# Patient Record
Sex: Female | Born: 1935 | Race: Black or African American | Hispanic: No | Smoking: Never smoker
Health system: Southern US, Community
[De-identification: ages and names within clinical notes are randomized; demographics above are authoritative.]

## PROBLEM LIST (undated history)

## (undated) DIAGNOSIS — R1310 Dysphagia, unspecified: Secondary | ICD-10-CM

## (undated) DIAGNOSIS — G8190 Hemiplegia, unspecified affecting unspecified side: Secondary | ICD-10-CM

## (undated) DIAGNOSIS — K219 Gastro-esophageal reflux disease without esophagitis: Secondary | ICD-10-CM

## (undated) DIAGNOSIS — R41841 Cognitive communication deficit: Secondary | ICD-10-CM

## (undated) DIAGNOSIS — I119 Hypertensive heart disease without heart failure: Secondary | ICD-10-CM

## (undated) DIAGNOSIS — Z96642 Presence of left artificial hip joint: Secondary | ICD-10-CM

## (undated) DIAGNOSIS — E7849 Other hyperlipidemia: Secondary | ICD-10-CM

## (undated) DIAGNOSIS — I639 Cerebral infarction, unspecified: Secondary | ICD-10-CM

## (undated) DIAGNOSIS — I161 Hypertensive emergency: Secondary | ICD-10-CM

## (undated) DIAGNOSIS — M25552 Pain in left hip: Secondary | ICD-10-CM

## (undated) DIAGNOSIS — E785 Hyperlipidemia, unspecified: Secondary | ICD-10-CM

## (undated) DIAGNOSIS — R945 Abnormal results of liver function studies: Secondary | ICD-10-CM

## (undated) DIAGNOSIS — E876 Hypokalemia: Secondary | ICD-10-CM

## (undated) DIAGNOSIS — R262 Difficulty in walking, not elsewhere classified: Secondary | ICD-10-CM

## (undated) DIAGNOSIS — R7989 Other specified abnormal findings of blood chemistry: Secondary | ICD-10-CM

## (undated) DIAGNOSIS — R0989 Other specified symptoms and signs involving the circulatory and respiratory systems: Secondary | ICD-10-CM

## (undated) DIAGNOSIS — M6281 Muscle weakness (generalized): Secondary | ICD-10-CM

## (undated) DIAGNOSIS — E43 Unspecified severe protein-calorie malnutrition: Secondary | ICD-10-CM

## (undated) DIAGNOSIS — Z931 Gastrostomy status: Secondary | ICD-10-CM

## (undated) DIAGNOSIS — S72009A Fracture of unspecified part of neck of unspecified femur, initial encounter for closed fracture: Secondary | ICD-10-CM

## (undated) HISTORY — DX: Gastrostomy status: Z93.1

## (undated) HISTORY — DX: Hemiplegia, unspecified affecting unspecified side: G81.90

## (undated) HISTORY — DX: Cerebral infarction, unspecified: I63.9

## (undated) HISTORY — DX: Cognitive communication deficit: R41.841

## (undated) HISTORY — DX: Difficulty in walking, not elsewhere classified: R26.2

## (undated) HISTORY — DX: Other hyperlipidemia: E78.49

## (undated) HISTORY — DX: Dysphagia, unspecified: R13.10

## (undated) HISTORY — DX: Hypertensive heart disease without heart failure: I11.9

## (undated) HISTORY — DX: Muscle weakness (generalized): M62.81

## (undated) HISTORY — DX: Unspecified severe protein-calorie malnutrition: E43

## (undated) HISTORY — DX: Pain in left hip: M25.552

## (undated) HISTORY — DX: Other specified symptoms and signs involving the circulatory and respiratory systems: R09.89

## (undated) HISTORY — DX: Presence of left artificial hip joint: Z96.642

## (undated) HISTORY — DX: Other specified abnormal findings of blood chemistry: R79.89

## (undated) HISTORY — DX: Hyperlipidemia, unspecified: E78.5

## (undated) HISTORY — DX: Abnormal results of liver function studies: R94.5

---

## 2017-03-04 DIAGNOSIS — E876 Hypokalemia: Secondary | ICD-10-CM

## 2017-03-04 DIAGNOSIS — S72009A Fracture of unspecified part of neck of unspecified femur, initial encounter for closed fracture: Secondary | ICD-10-CM

## 2017-03-04 DIAGNOSIS — I161 Hypertensive emergency: Secondary | ICD-10-CM

## 2017-03-04 HISTORY — DX: Fracture of unspecified part of neck of unspecified femur, initial encounter for closed fracture: S72.009A

## 2017-03-04 HISTORY — DX: Hypertensive emergency: I16.1

## 2017-03-04 HISTORY — PX: HIP SURGERY: SHX245

## 2017-03-04 HISTORY — DX: Hypokalemia: E87.6

## 2017-03-21 ENCOUNTER — Encounter (HOSPITAL_COMMUNITY): Payer: Self-pay

## 2017-03-21 ENCOUNTER — Other Ambulatory Visit: Payer: Self-pay

## 2017-03-21 ENCOUNTER — Emergency Department (HOSPITAL_COMMUNITY): Payer: Medicare Other

## 2017-03-21 ENCOUNTER — Inpatient Hospital Stay (HOSPITAL_COMMUNITY)
Admission: EM | Admit: 2017-03-21 | Discharge: 2017-04-04 | DRG: 469 | Disposition: A | Discharge status: Skilled Nursing Facility | Payer: Medicare Other | Attending: Internal Medicine | Admitting: Internal Medicine

## 2017-03-21 DIAGNOSIS — E870 Hyperosmolality and hypernatremia: Secondary | ICD-10-CM | POA: Diagnosis present

## 2017-03-21 DIAGNOSIS — Z8673 Personal history of transient ischemic attack (TIA), and cerebral infarction without residual deficits: Secondary | ICD-10-CM

## 2017-03-21 DIAGNOSIS — R748 Abnormal levels of other serum enzymes: Secondary | ICD-10-CM | POA: Diagnosis not present

## 2017-03-21 DIAGNOSIS — R7989 Other specified abnormal findings of blood chemistry: Secondary | ICD-10-CM

## 2017-03-21 DIAGNOSIS — R29810 Facial weakness: Secondary | ICD-10-CM | POA: Diagnosis present

## 2017-03-21 DIAGNOSIS — K219 Gastro-esophageal reflux disease without esophagitis: Secondary | ICD-10-CM

## 2017-03-21 DIAGNOSIS — N179 Acute kidney failure, unspecified: Secondary | ICD-10-CM | POA: Diagnosis present

## 2017-03-21 DIAGNOSIS — R9431 Abnormal electrocardiogram [ECG] [EKG]: Secondary | ICD-10-CM

## 2017-03-21 DIAGNOSIS — E876 Hypokalemia: Secondary | ICD-10-CM | POA: Diagnosis present

## 2017-03-21 DIAGNOSIS — W19XXXD Unspecified fall, subsequent encounter: Secondary | ICD-10-CM | POA: Diagnosis not present

## 2017-03-21 DIAGNOSIS — I639 Cerebral infarction, unspecified: Secondary | ICD-10-CM | POA: Diagnosis not present

## 2017-03-21 DIAGNOSIS — E860 Dehydration: Secondary | ICD-10-CM | POA: Diagnosis present

## 2017-03-21 DIAGNOSIS — S72002D Fracture of unspecified part of neck of left femur, subsequent encounter for closed fracture with routine healing: Secondary | ICD-10-CM | POA: Diagnosis not present

## 2017-03-21 DIAGNOSIS — I6300 Cerebral infarction due to thrombosis of unspecified precerebral artery: Secondary | ICD-10-CM | POA: Diagnosis not present

## 2017-03-21 DIAGNOSIS — Z8249 Family history of ischemic heart disease and other diseases of the circulatory system: Secondary | ICD-10-CM

## 2017-03-21 DIAGNOSIS — S72002A Fracture of unspecified part of neck of left femur, initial encounter for closed fracture: Principal | ICD-10-CM

## 2017-03-21 DIAGNOSIS — N183 Chronic kidney disease, stage 3 (moderate): Secondary | ICD-10-CM | POA: Diagnosis present

## 2017-03-21 DIAGNOSIS — E43 Unspecified severe protein-calorie malnutrition: Secondary | ICD-10-CM | POA: Diagnosis present

## 2017-03-21 DIAGNOSIS — I161 Hypertensive emergency: Secondary | ICD-10-CM | POA: Diagnosis present

## 2017-03-21 DIAGNOSIS — R1310 Dysphagia, unspecified: Secondary | ICD-10-CM

## 2017-03-21 DIAGNOSIS — I6523 Occlusion and stenosis of bilateral carotid arteries: Secondary | ICD-10-CM | POA: Diagnosis present

## 2017-03-21 DIAGNOSIS — I5030 Unspecified diastolic (congestive) heart failure: Secondary | ICD-10-CM | POA: Diagnosis not present

## 2017-03-21 DIAGNOSIS — W19XXXA Unspecified fall, initial encounter: Secondary | ICD-10-CM | POA: Diagnosis present

## 2017-03-21 DIAGNOSIS — I129 Hypertensive chronic kidney disease with stage 1 through stage 4 chronic kidney disease, or unspecified chronic kidney disease: Secondary | ICD-10-CM | POA: Diagnosis present

## 2017-03-21 DIAGNOSIS — I6389 Other cerebral infarction: Secondary | ICD-10-CM | POA: Diagnosis present

## 2017-03-21 DIAGNOSIS — Z4659 Encounter for fitting and adjustment of other gastrointestinal appliance and device: Secondary | ICD-10-CM

## 2017-03-21 DIAGNOSIS — I739 Peripheral vascular disease, unspecified: Secondary | ICD-10-CM | POA: Diagnosis present

## 2017-03-21 DIAGNOSIS — I248 Other forms of acute ischemic heart disease: Secondary | ICD-10-CM | POA: Diagnosis present

## 2017-03-21 DIAGNOSIS — Z789 Other specified health status: Secondary | ICD-10-CM | POA: Diagnosis not present

## 2017-03-21 DIAGNOSIS — Z9181 History of falling: Secondary | ICD-10-CM

## 2017-03-21 DIAGNOSIS — R778 Other specified abnormalities of plasma proteins: Secondary | ICD-10-CM | POA: Diagnosis present

## 2017-03-21 DIAGNOSIS — Z09 Encounter for follow-up examination after completed treatment for conditions other than malignant neoplasm: Secondary | ICD-10-CM

## 2017-03-21 DIAGNOSIS — Z419 Encounter for procedure for purposes other than remedying health state, unspecified: Secondary | ICD-10-CM

## 2017-03-21 HISTORY — DX: Gastro-esophageal reflux disease without esophagitis: K21.9

## 2017-03-21 HISTORY — DX: Cerebral infarction, unspecified: I63.9

## 2017-03-21 HISTORY — DX: Hypokalemia: E87.6

## 2017-03-21 HISTORY — DX: Hypertensive emergency: I16.1

## 2017-03-21 HISTORY — DX: Fracture of unspecified part of neck of unspecified femur, initial encounter for closed fracture: S72.009A

## 2017-03-21 LAB — DIFFERENTIAL
Basophils Absolute: 0 10*3/uL (ref 0.0–0.1)
Basophils Relative: 0 %
EOS ABS: 0 10*3/uL (ref 0.0–0.7)
EOS PCT: 0 %
LYMPHS ABS: 0.7 10*3/uL (ref 0.7–4.0)
Lymphocytes Relative: 8 %
MONO ABS: 0.5 10*3/uL (ref 0.1–1.0)
Monocytes Relative: 6 %
NEUTROS PCT: 86 %
Neutro Abs: 7.4 10*3/uL (ref 1.7–7.7)

## 2017-03-21 LAB — COMPREHENSIVE METABOLIC PANEL
ALBUMIN: 3.4 g/dL — AB (ref 3.5–5.0)
ALK PHOS: 85 U/L (ref 38–126)
ALT: 18 U/L (ref 14–54)
ANION GAP: 19 — AB (ref 5–15)
AST: 36 U/L (ref 15–41)
BILIRUBIN TOTAL: 1 mg/dL (ref 0.3–1.2)
BUN: 25 mg/dL — ABNORMAL HIGH (ref 6–20)
CALCIUM: 9.3 mg/dL (ref 8.9–10.3)
CO2: 30 mmol/L (ref 22–32)
Chloride: 98 mmol/L — ABNORMAL LOW (ref 101–111)
Creatinine, Ser: 1.71 mg/dL — ABNORMAL HIGH (ref 0.44–1.00)
GFR calc non Af Amer: 26 mL/min — ABNORMAL LOW (ref >60)
GFR, EST AFRICAN AMERICAN: 30 mL/min — AB (ref >60)
GLUCOSE: 123 mg/dL — AB (ref 65–99)
Sodium: 147 mmol/L — ABNORMAL HIGH (ref 135–145)
TOTAL PROTEIN: 8.2 g/dL — AB (ref 6.5–8.1)

## 2017-03-21 LAB — CBC
HCT: 37.3 % (ref 36.0–46.0)
Hemoglobin: 12.1 g/dL (ref 12.0–15.0)
MCH: 28.4 pg (ref 26.0–34.0)
MCHC: 32.4 g/dL (ref 30.0–36.0)
MCV: 87.6 fL (ref 78.0–100.0)
PLATELETS: 427 10*3/uL — AB (ref 150–400)
RBC: 4.26 MIL/uL (ref 3.87–5.11)
RDW: 14 % (ref 11.5–15.5)
WBC: 8.6 10*3/uL (ref 4.0–10.5)

## 2017-03-21 LAB — MAGNESIUM: MAGNESIUM: 1.9 mg/dL (ref 1.7–2.4)

## 2017-03-21 LAB — PROTIME-INR
INR: 0.99
Prothrombin Time: 13 seconds (ref 11.4–15.2)

## 2017-03-21 LAB — I-STAT TROPONIN, ED: TROPONIN I, POC: 0.1 ng/mL — AB (ref 0.00–0.08)

## 2017-03-21 LAB — ETHANOL: Alcohol, Ethyl (B): <10 mg/dL (ref <10)

## 2017-03-21 LAB — APTT: aPTT: 31 seconds (ref 24–36)

## 2017-03-21 MED ORDER — STROKE: EARLY STAGES OF RECOVERY BOOK
Freq: Once | Status: AC
  Administered 2017-03-22: 18:00:00
  Filled 2017-03-21: qty 1

## 2017-03-21 MED ORDER — METHOCARBAMOL 500 MG PO TABS: 500.0000 mg | ORAL_TABLET | Freq: Three times a day (TID) | ORAL | Status: DC | PRN

## 2017-03-21 MED ORDER — ACETAMINOPHEN 650 MG RE SUPP: 650.0000 mg | RECTAL | Status: DC | PRN

## 2017-03-21 MED ORDER — PANTOPRAZOLE SODIUM 40 MG IV SOLR
40.0000 mg | Freq: Every day | INTRAVENOUS | Status: DC
  Administered 2017-03-22 – 2017-03-25 (×5): 40 mg via INTRAVENOUS
  Filled 2017-03-21 (×5): qty 40

## 2017-03-21 MED ORDER — MAGNESIUM SULFATE IN D5W 1-5 GM/100ML-% IV SOLN
1.0000 g | Freq: Once | INTRAVENOUS | Status: AC
  Administered 2017-03-21: 1 g via INTRAVENOUS
  Filled 2017-03-21: qty 100

## 2017-03-21 MED ORDER — NITROGLYCERIN 0.4 MG SL SUBL: 0.4000 mg | SUBLINGUAL_TABLET | SUBLINGUAL | Status: DC | PRN

## 2017-03-21 MED ORDER — DEXTROSE-NACL 5-0.45 % IV SOLN
INTRAVENOUS | Status: DC
  Administered 2017-03-22: 05:00:00 via INTRAVENOUS

## 2017-03-21 MED ORDER — POTASSIUM CHLORIDE 10 MEQ/100ML IV SOLN: 10.0000 meq | INTRAVENOUS | Status: DC

## 2017-03-21 MED ORDER — ASPIRIN 325 MG PO TABS
325.0000 mg | ORAL_TABLET | Freq: Every day | ORAL | Status: DC
  Administered 2017-03-24 – 2017-04-04 (×10): 325 mg via ORAL
  Filled 2017-03-21 (×11): qty 1

## 2017-03-21 MED ORDER — ZOLPIDEM TARTRATE 5 MG PO TABS
5.0000 mg | ORAL_TABLET | Freq: Every evening | ORAL | Status: DC | PRN
  Administered 2017-03-30: 5 mg via ORAL
  Filled 2017-03-21: qty 1

## 2017-03-21 MED ORDER — MORPHINE SULFATE (PF) 4 MG/ML IV SOLN
2.0000 mg | INTRAVENOUS | Status: DC | PRN
  Administered 2017-03-23 (×2): 2 mg via INTRAVENOUS
  Filled 2017-03-21 (×2): qty 1

## 2017-03-21 MED ORDER — OXYCODONE-ACETAMINOPHEN 5-325 MG PO TABS: 1.0000 | ORAL_TABLET | ORAL | Status: DC | PRN

## 2017-03-21 MED ORDER — HYDROXYZINE HCL 10 MG PO TABS
10.0000 mg | ORAL_TABLET | Freq: Three times a day (TID) | ORAL | Status: DC | PRN
  Filled 2017-03-21: qty 1

## 2017-03-21 MED ORDER — POTASSIUM CHLORIDE 10 MEQ/100ML IV SOLN
10.0000 meq | INTRAVENOUS | Status: AC
  Administered 2017-03-21 – 2017-03-22 (×4): 10 meq via INTRAVENOUS
  Filled 2017-03-21 (×4): qty 100

## 2017-03-21 MED ORDER — HYDRALAZINE HCL 20 MG/ML IJ SOLN: 5.0000 mg | INTRAMUSCULAR | Status: DC | PRN

## 2017-03-21 MED ORDER — SODIUM CHLORIDE 0.9 % IV SOLN: INTRAVENOUS | Status: DC

## 2017-03-21 MED ORDER — SENNOSIDES-DOCUSATE SODIUM 8.6-50 MG PO TABS: 1.0000 | ORAL_TABLET | Freq: Every evening | ORAL | Status: DC | PRN

## 2017-03-21 MED ORDER — ACETAMINOPHEN 160 MG/5ML PO SOLN: 650.0000 mg | ORAL | Status: DC | PRN

## 2017-03-21 MED ORDER — ACETAMINOPHEN 325 MG PO TABS
650.0000 mg | ORAL_TABLET | ORAL | Status: DC | PRN
  Administered 2017-03-26 – 2017-04-03 (×3): 650 mg via ORAL
  Filled 2017-03-21 (×4): qty 2

## 2017-03-21 MED ORDER — ASPIRIN 300 MG RE SUPP
300.0000 mg | Freq: Every day | RECTAL | Status: DC
  Administered 2017-03-22 – 2017-03-30 (×3): 300 mg via RECTAL
  Filled 2017-03-21 (×4): qty 1

## 2017-03-21 MED ORDER — LABETALOL HCL 5 MG/ML IV SOLN
5.0000 mg | Freq: Once | INTRAVENOUS | Status: AC
  Administered 2017-03-21: 5 mg via INTRAVENOUS
  Filled 2017-03-21: qty 4

## 2017-03-21 NOTE — H&P (Addendum)
History and Physical    Alyssa Savage KNL:[id number] DOB: March 04, 1930 DOA: 03/21/2017  Referring MD/NP/PA:   PCP: Patient, No Pcp Per   Patient coming from:  The patient is coming from home.  At baseline, pt is independent for most of ADL.   Chief Complaint: fall and left hip pain, and slurred speech  HPI: Alyssa Savage is a 84 y.o. female with medical history significant of GERD, who presents with slurred speech and fall and left hip pain.  Per her sister, pt fell approximately 2 weeks ago. She is having pain in her left hip that extends down towards the knee. She initially was walking with pain however this morning she was unable to get up. She was found to have slurred speech in AM. She denies unilateral weakness or numbness in extremities. No vision change or hearing loss. She has mild left facial droop. Patient denies chest pain, shortness breath. She has mild dry cough. She denies nausea, vomiting, diarrhea or abdominal pain no symptoms of UTI.  Patient lives at home with extended family members.  Patient has not seen a doctor in approximately 60 years.    ED Course: pt was found to have trop 0.10, WBC 8.6, INR 0.99, PTT 31, potassium 2.0, acute renal injury with creatinine 1.71, BUN 25, temperature normal, no tachycardia, elevated blood pressure 218/107, negative chest x-ray. X-ray of left femur and pelvis showed left femoral head and neck fracture. Head CT was performed which shows right basal ganglia infarct. Pt is admitted to tele bed as inpt. Neurology, Dr. Leonel Ramsay and orthopedic surgeon, Dr.Swinteck  Review of Systems:   General: no fevers, chills, no body weight gain, has fatigue HEENT: no blurry vision, hearing changes or sore throat Respiratory: no dyspnea, coughing, wheezing CV: no chest pain, no palpitations GI: no nausea, vomiting, abdominal pain, diarrhea, constipation GU: no dysuria, burning on urination, increased urinary frequency, hematuria  Ext: no leg  edema Neuro: no unilateral weakness, numbness, or tingling, no vision change or hearing loss. Has slurred speech and left facial droop. Skin: no rash, no skin tear. MSK: has left hip pain Heme: No easy bruising.  Travel history: No recent long distant travel.  Allergy: No Known Allergies  Past Medical History:  Diagnosis Date  . GERD (gastroesophageal reflux disease)     History reviewed. No pertinent surgical history.  Social History:  reports that  has never smoked. She does not have any smokeless tobacco history on file. She reports that she does not drink alcohol or use drugs.  Family History:  Family History  Problem Relation Age of Onset  . CAD Mother   . Heart disease Mother      Prior to Admission medications   Not on File    Physical Exam: Vitals:   03/21/17 2300 03/21/17 2345 03/22/17 0000 03/22/17 0305  BP: (!) 191/100 (!) 187/89 (!) 189/94 (!) 209/105  Pulse: 60 (!) 47 61 61  Resp: 14 (!) 23 (!) 24 16  Temp:      TempSrc:      SpO2: 97% 96% 97% 96%   General: Not in acute distress. Has dry mucus and membrane HEENT:       Eyes: PERRL, EOMI, no scleral icterus.       ENT: No discharge from the ears and nose, no pharynx injection, no tonsillar enlargement.        Neck: No JVD, no bruit, no mass felt. Heme: No neck lymph node enlargement. Cardiac: S1/S2, RRR, No murmurs, No  gallops or rubs. Respiratory: No rales, wheezing, rhonchi or rubs. GI: Soft, nondistended, nontender, no rebound pain, no organomegaly, BS present. GU: No hematuria Ext: No pitting leg edema bilaterally. 2+DP/PT pulse bilaterally. Musculoskeletal: has left hip tenderness. Skin: No rashes.  Neuro: Alert, oriented X3, Has slurred speech and left facial droop. Muscle strength 5/5 in all extremities, sensation to light touch intact. Brachial reflex 2+ bilaterally. Psych: Patient is not psychotic, no suicidal or hemocidal ideation.  Labs on Admission: I have personally reviewed following  labs and imaging studies  CBC: Recent Labs  Lab 03/21/17 1950  WBC 8.6  NEUTROABS 7.4  HGB 12.1  HCT 37.3  MCV 87.6  PLT 097*   Basic Metabolic Panel: Recent Labs  Lab 03/21/17 1950  NA 147*  K <2.0*  CL 98*  CO2 30  GLUCOSE 123*  BUN 25*  CREATININE 1.71*  CALCIUM 9.3  MG 1.9   GFR: CrCl cannot be calculated (Unknown ideal weight.). Liver Function Tests: Recent Labs  Lab 03/21/17 1950  AST 36  ALT 18  ALKPHOS 85  BILITOT 1.0  PROT 8.2*  ALBUMIN 3.4*   No results for input(s): LIPASE, AMYLASE in the last 168 hours. No results for input(s): AMMONIA in the last 168 hours. Coagulation Profile: Recent Labs  Lab 03/21/17 1950  INR 0.99   Cardiac Enzymes: Recent Labs  Lab 03/22/17 0117  TROPONINI 0.12*   BNP (last 3 results) No results for input(s): PROBNP in the last 8760 hours. HbA1C: No results for input(s): HGBA1C in the last 72 hours. CBG: No results for input(s): GLUCAP in the last 168 hours. Lipid Profile: No results for input(s): CHOL, HDL, LDLCALC, TRIG, CHOLHDL, LDLDIRECT in the last 72 hours. Thyroid Function Tests: No results for input(s): TSH, T4TOTAL, FREET4, T3FREE, THYROIDAB in the last 72 hours. Anemia Panel: No results for input(s): VITAMINB12, FOLATE, FERRITIN, TIBC, IRON, RETICCTPCT in the last 72 hours. Urine analysis: No results found for: COLORURINE, APPEARANCEUR, LABSPEC, PHURINE, GLUCOSEU, HGBUR, BILIRUBINUR, KETONESUR, PROTEINUR, UROBILINOGEN, NITRITE, LEUKOCYTESUR Sepsis Labs: @LABRCNTIP (procalcitonin:4,lacticidven:4) )No results found for this or any previous visit (from the past 240 hour(s)).   Radiological Exams on Admission: Dg Chest 1 View  Result Date: 03/21/2017 CLINICAL DATA:  Fall EXAM: CHEST 1 VIEW COMPARISON:  None. FINDINGS: No acute pulmonary infiltrate or effusion. Mild cardiomegaly. Tortuous ectatic aorta with mild atherosclerosis. No pneumothorax. IMPRESSION: 1. Negative for acute infiltrate or edema 2.  Cardiomegaly Electronically Signed   By: Donavan Foil M.D.   On: 03/21/2017 19:50   Dg Pelvis 1-2 Views  Result Date: 03/21/2017 CLINICAL DATA:  Fall with pain EXAM: PELVIS - 1-2 VIEW COMPARISON:  None. FINDINGS: Acute, displaced left femoral neck fracture with mild superior migration of the distal femur. Left femoral head projects in joint. Pubic symphysis and rami are intact. Normal right hip alignment. Multiple large calcified masses within the pelvis, measuring up to 6.4 cm. IMPRESSION: 1. Acute displaced left femoral neck fracture 2. Multiple large calcified masses in the pelvis, possibly due to large calcified fibroids, CT could better localize the calcifications. Electronically Signed   By: Donavan Foil M.D.   On: 03/21/2017 19:47   Ct Head Wo Contrast  Result Date: 03/21/2017 CLINICAL DATA:  [place] 2 weeks ago, altered mental status. EXAM: CT HEAD WITHOUT CONTRAST TECHNIQUE: Contiguous axial images were obtained from the base of the skull through the vertex without intravenous contrast. COMPARISON:  None. FINDINGS: BRAIN: Patchy hypodense RIGHT basal ganglia. Old LEFT basal ganglia and bilateral thalamus  lacunar infarcts. Small area suspected RIGHT frontal lobe encephalomalacia. Patchy to confluent supratentorial white matter hypodensities. No advanced parenchymal brain volume loss for age. No hydrocephalus. No hemorrhage. No abnormal extra-axial fluid collections. VASCULAR: Mild calcific atherosclerosis of the carotid siphons. SKULL: No skull fracture. Anteriorly subluxed mandible condyles presumably from open mouth positioning. No significant scalp soft tissue swelling. SINUSES/ORBITS: The mastoid air-cells and included paranasal sinuses are well-aerated.The included ocular globes and orbital contents are non-suspicious. OTHER: 14 mm single bubble of gas LEFT infratemporal fossa. IMPRESSION: 1. Acute nonhemorrhagic RIGHT basal ganglia infarct. 2. Old bilateral basal ganglia and thalamus lacunar  infarcts. Moderate to severe chronic small vessel ischemic disease. 3. Acute findings discussed with and reconfirmed by Dr.JON KNAPP on 03/21/2017 at 8:44 pm. Electronically Signed   By: Elon Alas M.D.   On: 03/21/2017 20:45   Dg Femur Min 2 Views Left  Result Date: 03/21/2017 CLINICAL DATA:  Fall with pain EXAM: LEFT FEMUR 2 VIEWS COMPARISON:  None. FINDINGS: Acute displaced left femoral neck fracture with mild varus angulation. No femoral head dislocation. Partially visible calcified pelvic masses. Distal femur appears intact. Degenerative changes at the knee IMPRESSION: Acute displaced left femoral neck fracture Electronically Signed   By: Donavan Foil M.D.   On: 03/21/2017 19:47     EKG: Independently reviewed.  Sinus rhythm, QTc 681, LAD, PVC, mild T-wave inversion in V2-V4  Assessment/Plan Principal Problem:   Stroke [facility]) Active Problems:   Fall   Fracture of femoral neck, left, closed ([place])   AKI (acute kidney injury) ([place])   Hypertensive emergency   Elevated troponin   Hypokalemia   GERD (gastroesophageal reflux disease)   Stroke [facility]):  Head CT was performed which shows right basal ganglia infarct. Dr. Leonel Ramsay was consulted, recommended stroke workup.  - will admit to tele bed  - Obtain MRI/MRA  - will allow permissive HTN in the setting of acute stroke - Check carotid dopplers  - ASA and lipitor - fasting lipid panel and HbA1c  - 2D transthoracic echocardiography  - Check UDS  - PT/OT consult when able to - SLP - SW and CM consult  Fall and fracture of femoral neck, left, closed [facility]): Orthopedic surgeon, Dr.Swinteck was consulted. - Pain control: morphine prn and percocet - When necessary Zofran for nausea - Robaxin for muscle spasm - type and cross - INR/PTT - PT/OT when able to (not ordered now)  AKI (acute kidney injury) [facility]): cre 1.71 and BNU 25. Likely due to prerenal secondary to dehydration - IVF: D5-1/2 NS at 75 cc/h - Check FeNa  -  Follow up renal function by BMP  Hypertensive emergency:  -prn hydralazine for SBP>220  Elevated troponin: trop 0.10. No CP. Likely due to demand ischemia secondary to hypertension and acute stroke. - cycle CE q6 x3 and repeat EKG in the am  - prn Nitroglycerin, Morphine, and aspirin, lipitor  - Risk factor stratification: will check FLP, UDS and A1C  - 2d echo -inpt card consult was requested via Epic  Hypokalemia: K= 2.0  on admission. - Repleted - give 1 gram of magnesium sulfate - Check Mg level  GERD (gastroesophageal reflux disease) -protonix   DVT ppx: SCD Code Status: Full code Family Communication:  Yes, patient's sister at bed side Disposition Plan:  Anticipate discharge back to previous home environment Consults called:  Neurology, Dr. Leonel Ramsay and orthopedic surgeon, [place] Admission status:  Inpatient/tele         Date of Service 03/22/2017  Ivor Costa [facility] Pager [phone]  If 7PM-7AM, please contact night-coverage [email or web address] Password [facility] 03/22/2017, 3:18 AM

## 2017-03-21 NOTE — ED Provider Notes (Signed)
[place] EMERGENCY DEPARTMENT Provider Note   CSN: [id number] Arrival date & time: 03/21/17  1729     History   Chief Complaint Chief Complaint  Patient presents with  . Fall    HPI Alyssa Savage is a 84 y.o. female.  HPI Patient presents to the emergency room for evaluation of hip pain as well as slurred speech.  Patient lives at home with extended family members.  Patient has not seen a doctor in approximately 60 years.  Patient apparently had a fall a couple weeks ago.  She initially was walking with pain however this morning she was unable to get up.  She is having pain in her left hip that extends down towards the knee.  Family also noticed that her speech seemed somewhat slurred.  This is new.  Patient denies any trouble with numbness or weakness in her extremities although she does have difficulty moving her left leg but that seems to be from pain. History reviewed. No pertinent past medical history.  There are no active problems to display for this patient.     OB History    No data available       Home Medications    Prior to Admission medications   Not on File    Family History History reviewed. No pertinent family history.  Social History Social History   Tobacco Use  . Smoking status: Not on file  Substance Use Topics  . Alcohol use: Not on file  . Drug use: Not on file     Allergies   Patient has no allergy information on record.   Review of Systems Review of Systems  All other systems reviewed and are negative.    Physical Exam Updated Vital Signs BP (!) 210/117   Pulse 77   Temp 97.7 F (36.5 C) (Oral)   Resp 18   SpO2 98%   Physical Exam  Constitutional: She appears well-developed and well-nourished. No distress.  HENT:  Head: Normocephalic and atraumatic.  Right Ear: External ear normal.  Left Ear: External ear normal.  Eyes: Conjunctivae are normal. Right eye exhibits no discharge. Left eye  exhibits no discharge. No scleral icterus.  Neck: Neck supple. No tracheal deviation present.  Cardiovascular: Normal rate, regular rhythm and intact distal pulses.  Pulmonary/Chest: Effort normal and breath sounds normal. No stridor. No respiratory distress. She has no wheezes. She has no rales.  Abdominal: Soft. Bowel sounds are normal. She exhibits no distension. There is no tenderness. There is no rebound and no guarding.  Musculoskeletal: She exhibits no edema.       Left hip: She exhibits tenderness and bony tenderness.  Patient has difficulty lifting her left leg off the bed because of pain, she has pain with passive range of motion,  Neurological: She is alert. No cranial nerve deficit (She will right facial droop, extraocular movements intact,  slurred speech ) or sensory deficit. She exhibits normal muscle tone. She displays no seizure activity. Coordination normal.   patient is able to lift both arms off the bed, she is able to plantarflex and dorsiflex equally bilateral lower extremities  Skin: Skin is warm and dry. No rash noted.  Psychiatric: She has a normal mood and affect.  Nursing note and vitals reviewed.    ED Treatments / Results  Labs (all labs ordered are listed, but only abnormal results are displayed) Labs Reviewed  CBC - Abnormal; Notable for the following components:      Result  Value   Platelets 427 (*)    All other components within normal limits  COMPREHENSIVE METABOLIC PANEL - Abnormal; Notable for the following components:   Sodium 147 (*)    Potassium <2.0 (*)    Chloride 98 (*)    Glucose, Bld 123 (*)    BUN 25 (*)    Creatinine, Ser 1.71 (*)    Total Protein 8.2 (*)    Albumin 3.4 (*)    GFR calc non Af Amer 26 (*)    GFR calc Af Amer 30 (*)    Anion gap 19 (*)    All other components within normal limits  I-STAT TROPONIN, ED - Abnormal; Notable for the following components:   Troponin i, poc 0.10 (*)    All other components within normal  limits  ETHANOL  PROTIME-INR  APTT  DIFFERENTIAL  RAPID URINE DRUG SCREEN, HOSP PERFORMED  URINALYSIS, ROUTINE W REFLEX MICROSCOPIC    EKG  EKG Interpretation  Date/Time:  Monday March 21 2017 19:59:55 EST Ventricular Rate:  72 PR Interval:  136 QRS Duration: 90 QT Interval:  622 QTC Calculation: 681 R Axis:   -38 Text Interpretation:  ** Critical Test Result: Long QTc Sinus rhythm with marked sinus arrhythmia Left axis deviation Moderate voltage criteria for LVH, may be normal variant Nonspecific ST and T wave abnormality Prolonged QT Abnormal ECG No old tracing to compare Confirmed by Dorie Rank [phone]) on 03/21/2017 8:04:06 PM       Radiology Dg Chest 1 View  Result Date: 03/21/2017 CLINICAL DATA:  Fall EXAM: CHEST 1 VIEW COMPARISON:  None. FINDINGS: No acute pulmonary infiltrate or effusion. Mild cardiomegaly. Tortuous ectatic aorta with mild atherosclerosis. No pneumothorax. IMPRESSION: 1. Negative for acute infiltrate or edema 2. Cardiomegaly Electronically Signed   By: Donavan Foil M.D.   On: 03/21/2017 19:50   Dg Pelvis 1-2 Views  Result Date: 03/21/2017 CLINICAL DATA:  Fall with pain EXAM: PELVIS - 1-2 VIEW COMPARISON:  None. FINDINGS: Acute, displaced left femoral neck fracture with mild superior migration of the distal femur. Left femoral head projects in joint. Pubic symphysis and rami are intact. Normal right hip alignment. Multiple large calcified masses within the pelvis, measuring up to 6.4 cm. IMPRESSION: 1. Acute displaced left femoral neck fracture 2. Multiple large calcified masses in the pelvis, possibly due to large calcified fibroids, CT could better localize the calcifications. Electronically Signed   By: Donavan Foil M.D.   On: 03/21/2017 19:47   Ct Head Wo Contrast  Result Date: 03/21/2017 CLINICAL DATA:  [place] 2 weeks ago, altered mental status. EXAM: CT HEAD WITHOUT CONTRAST TECHNIQUE: Contiguous axial images were obtained from the base of the skull  through the vertex without intravenous contrast. COMPARISON:  None. FINDINGS: BRAIN: Patchy hypodense RIGHT basal ganglia. Old LEFT basal ganglia and bilateral thalamus lacunar infarcts. Small area suspected RIGHT frontal lobe encephalomalacia. Patchy to confluent supratentorial white matter hypodensities. No advanced parenchymal brain volume loss for age. No hydrocephalus. No hemorrhage. No abnormal extra-axial fluid collections. VASCULAR: Mild calcific atherosclerosis of the carotid siphons. SKULL: No skull fracture. Anteriorly subluxed mandible condyles presumably from open mouth positioning. No significant scalp soft tissue swelling. SINUSES/ORBITS: The mastoid air-cells and included paranasal sinuses are well-aerated.The included ocular globes and orbital contents are non-suspicious. OTHER: 14 mm single bubble of gas LEFT infratemporal fossa. IMPRESSION: 1. Acute nonhemorrhagic RIGHT basal ganglia infarct. 2. Old bilateral basal ganglia and thalamus lacunar infarcts. Moderate to severe chronic small vessel ischemic disease.  3. Acute findings discussed with and reconfirmed by Dr.Franklin [place] on 03/21/2017 at 8:44 pm. Electronically Signed   By: Elon Alas M.D.   On: 03/21/2017 20:45   Dg Femur Min 2 Views Left  Result Date: 03/21/2017 CLINICAL DATA:  Fall with pain EXAM: LEFT FEMUR 2 VIEWS COMPARISON:  None. FINDINGS: Acute displaced left femoral neck fracture with mild varus angulation. No femoral head dislocation. Partially visible calcified pelvic masses. Distal femur appears intact. Degenerative changes at the knee IMPRESSION: Acute displaced left femoral neck fracture Electronically Signed   By: Donavan Foil M.D.   On: 03/21/2017 19:47    Procedures .Critical Care Performed by: Dorie Rank, MD Authorized by: Dorie Rank, MD   Critical care provider statement:    Critical care time (minutes):  30   Critical care was time spent personally by me on the following activities:  Discussions with  consultants, evaluation of patient's response to treatment, examination of patient, ordering and performing treatments and interventions, ordering and review of laboratory studies, ordering and review of radiographic studies, pulse oximetry, re-evaluation of patient's condition, obtaining history from patient or surrogate and review of old charts   (including critical care time)  Medications Ordered in ED Medications  labetalol (NORMODYNE,TRANDATE) injection 5 mg (not administered)  potassium chloride 10 mEq in 100 mL IVPB (not administered)     Initial Impression / Assessment and Plan / ED Course  I have reviewed the triage vital signs and the nursing notes.  Pertinent labs & imaging results that were available during my care of the patient were reviewed by me and considered in my medical decision making (see chart for details).  Clinical Course as of Mar 22 1256  Mon Mar 21, 2017  2126 D/w Leonel Ramsay.  OK to treat BP gently right now.  WIll consult on pt.     [JK]  2127 D/w Dr Lyla Glassing. Will see pt tomorrow  [JK]    Clinical Course User Index [JK] Dorie Rank, MD   Patient presented to the emergency room for evaluation of hip pain and slurred speech.  Patient's physical exam is suggestive of a stroke.  CT scan demonstrates acute/subacute stroke.  Unclear the exact timeframe of her symptoms.  The patient's last time normal was approximately 48 hours ago.  I have ordered labetalol to try to bring down her blood pressure slightly.  She is x-rays show a left hip fracture.  Patient is not stable for surgery right now.  I spoke with Dr. Lyla Glassing.  He will see the patient in consultation.  Patient is also very hypokalemic.  I have ordered IV potassium.  I will consult with the medical service for admission.   Final Clinical Impressions(s) / ED Diagnoses   Final diagnoses:  Closed fracture of left hip, initial encounter ([place])  Prolonged Q-T interval on ECG      Dorie Rank,  MD 03/22/17 1258

## 2017-03-21 NOTE — ED Triage Notes (Signed)
Pt arrived via [facility]; per EMS pt came from [facility] after a fall 2 wks ago. Pt lives alone with extended family looking out for her well-being; Pt is generally ambulatory; however, this am she was unable to get up and c/o pain to L femur area; Pt states that she has not seen MD in over 60 years. No med hx; NKDA; no dx; 148/119; CBG 138

## 2017-03-22 ENCOUNTER — Encounter (HOSPITAL_COMMUNITY): Payer: Self-pay | Admitting: Internal Medicine

## 2017-03-22 ENCOUNTER — Other Ambulatory Visit: Payer: Self-pay

## 2017-03-22 ENCOUNTER — Inpatient Hospital Stay (HOSPITAL_COMMUNITY): Payer: Medicare Other

## 2017-03-22 DIAGNOSIS — I5030 Unspecified diastolic (congestive) heart failure: Secondary | ICD-10-CM

## 2017-03-22 DIAGNOSIS — I6300 Cerebral infarction due to thrombosis of unspecified precerebral artery: Secondary | ICD-10-CM

## 2017-03-22 DIAGNOSIS — I639 Cerebral infarction, unspecified: Secondary | ICD-10-CM

## 2017-03-22 DIAGNOSIS — R748 Abnormal levels of other serum enzymes: Secondary | ICD-10-CM

## 2017-03-22 LAB — LIPID PANEL
CHOL/HDL RATIO: 3 ratio
CHOLESTEROL: 203 mg/dL — AB (ref 0–200)
HDL: 68 mg/dL (ref >40)
LDL Cholesterol: 117 mg/dL — ABNORMAL HIGH (ref 0–99)
Triglycerides: 90 mg/dL (ref <150)
VLDL: 18 mg/dL (ref 0–40)

## 2017-03-22 LAB — ECHOCARDIOGRAM COMPLETE
AOASC: 35 cm
E decel time: 278 msec
FS: 43 % (ref 28–44)
Height: 60 in
IV/PV OW: 1.29
LA diam index: 2.37 cm/m2
LASIZE: 31 mm
LEFT ATRIUM END SYS DIAM: 31 mm
LV PW d: 11.6 mm — AB (ref 0.6–1.1)
LVOT area: 2.54 cm2
LVOT diameter: 18 mm
MV Dec: 278
MVAP: 2.72 cm2
MVPKAVEL: 106 m/s
MVPKEVEL: 41.2 m/s
P 1/2 time: 81 ms
TAPSE: 10.8 mm
Weight: 1449.74 oz

## 2017-03-22 LAB — BASIC METABOLIC PANEL
Anion gap: 23 — ABNORMAL HIGH (ref 5–15)
Anion gap: 17 — ABNORMAL HIGH (ref 5–15)
BUN: 29 mg/dL — AB (ref 6–20)
BUN: 25 mg/dL — AB (ref 6–20)
CALCIUM: 8.9 mg/dL (ref 8.9–10.3)
CALCIUM: 8.9 mg/dL (ref 8.9–10.3)
CHLORIDE: 104 mmol/L (ref 101–111)
CO2: 26 mmol/L (ref 22–32)
CO2: 27 mmol/L (ref 22–32)
CREATININE: 1.57 mg/dL — AB (ref 0.44–1.00)
CREATININE: 1.55 mg/dL — AB (ref 0.44–1.00)
Chloride: 101 mmol/L (ref 101–111)
GFR calc Af Amer: 34 mL/min — ABNORMAL LOW (ref >60)
GFR calc Af Amer: 34 mL/min — ABNORMAL LOW (ref >60)
GFR calc non Af Amer: 30 mL/min — ABNORMAL LOW (ref >60)
GFR, EST NON AFRICAN AMERICAN: 29 mL/min — AB (ref >60)
GLUCOSE: 104 mg/dL — AB (ref 65–99)
Glucose, Bld: 112 mg/dL — ABNORMAL HIGH (ref 65–99)
POTASSIUM: 2.1 mmol/L — AB (ref 3.5–5.1)
Potassium: 2.4 mmol/L — CL (ref 3.5–5.1)
SODIUM: 150 mmol/L — AB (ref 135–145)
Sodium: 148 mmol/L — ABNORMAL HIGH (ref 135–145)

## 2017-03-22 LAB — CREATININE, URINE, RANDOM: Creatinine, Urine: 52.36 mg/dL

## 2017-03-22 LAB — HEMOGLOBIN A1C
Hgb A1c MFr Bld: 6.3 % — ABNORMAL HIGH (ref 4.8–5.6)
Mean Plasma Glucose: 134.11 mg/dL

## 2017-03-22 LAB — RAPID URINE DRUG SCREEN, HOSP PERFORMED
AMPHETAMINES: NOT DETECTED
BENZODIAZEPINES: NOT DETECTED
Barbiturates: NOT DETECTED
Cocaine: NOT DETECTED
Opiates: NOT DETECTED
TETRAHYDROCANNABINOL: NOT DETECTED

## 2017-03-22 LAB — URINALYSIS, ROUTINE W REFLEX MICROSCOPIC
Bilirubin Urine: NEGATIVE
GLUCOSE, UA: 50 mg/dL — AB
KETONES UR: 5 mg/dL — AB
Leukocytes, UA: NEGATIVE
NITRITE: NEGATIVE
PROTEIN: 30 mg/dL — AB
Specific Gravity, Urine: 1.012 (ref 1.005–1.030)
pH: 7 (ref 5.0–8.0)

## 2017-03-22 LAB — TROPONIN I
TROPONIN I: 0.12 ng/mL — AB (ref <0.03)
TROPONIN I: 0.12 ng/mL — AB (ref <0.03)
Troponin I: 0.12 ng/mL (ref <0.03)

## 2017-03-22 LAB — SODIUM, URINE, RANDOM: Sodium, Ur: 138 mmol/L

## 2017-03-22 LAB — ABO/RH: ABO/RH(D): B POS

## 2017-03-22 LAB — MAGNESIUM: MAGNESIUM: 2.2 mg/dL (ref 1.7–2.4)

## 2017-03-22 MED ORDER — ATORVASTATIN CALCIUM 80 MG PO TABS: 80.0000 mg | ORAL_TABLET | Freq: Every day | ORAL | Status: DC

## 2017-03-22 MED ORDER — POTASSIUM CHLORIDE 10 MEQ/100ML IV SOLN
10.0000 meq | INTRAVENOUS | Status: AC
  Administered 2017-03-22 (×2): 10 meq via INTRAVENOUS
  Filled 2017-03-22 (×4): qty 100

## 2017-03-22 MED ORDER — POTASSIUM CHLORIDE 10 MEQ/100ML IV SOLN
10.0000 meq | INTRAVENOUS | Status: AC
  Administered 2017-03-22 (×4): 10 meq via INTRAVENOUS
  Filled 2017-03-22 (×4): qty 100

## 2017-03-22 MED ORDER — POTASSIUM CHLORIDE 20 MEQ/15ML (10%) PO SOLN: 40.0000 meq | Freq: Once | ORAL | Status: DC

## 2017-03-22 MED ORDER — WHITE PETROLATUM EX OINT
TOPICAL_OINTMENT | CUTANEOUS | Status: AC
  Administered 2017-03-22: 22:00:00
  Filled 2017-03-22: qty 28.35

## 2017-03-22 MED ORDER — ATORVASTATIN CALCIUM 40 MG PO TABS
40.0000 mg | ORAL_TABLET | Freq: Every day | ORAL | Status: DC
  Administered 2017-03-23 – 2017-04-03 (×12): 40 mg via ORAL
  Filled 2017-03-22 (×13): qty 1

## 2017-03-22 MED ORDER — POTASSIUM CHLORIDE 10 MEQ/100ML IV SOLN
10.0000 meq | INTRAVENOUS | Status: AC
  Administered 2017-03-22 – 2017-03-23 (×2): 10 meq via INTRAVENOUS
  Filled 2017-03-22 (×2): qty 100

## 2017-03-22 MED ORDER — KCL IN DEXTROSE-NACL 20-5-0.45 MEQ/L-%-% IV SOLN
INTRAVENOUS | Status: DC
  Administered 2017-03-22 – 2017-03-24 (×3): via INTRAVENOUS
  Administered 2017-03-25: 980 mL via INTRAVENOUS
  Administered 2017-03-26: 11:00:00 via INTRAVENOUS
  Filled 2017-03-22 (×7): qty 1000

## 2017-03-22 NOTE — ED Notes (Signed)
Paged MD Blaine Hamper concerning pt's high BP's. States permissive HTN is allowed on this pt d/t acute stroke.

## 2017-03-22 NOTE — Clinical Social Work Note (Signed)
Clinical Social Work Assessment  Patient Details  Name: Alyssa Savage MRN: [id number] Date of Birth: 07-27-1930  Date of referral:  03/22/17               Reason for consult:  Facility Placement                Permission sought to share information with:    Permission granted to share information::  No  Name::        Agency::     Relationship::     Contact Information:     Housing/Transportation Living arrangements for the past 2 months:  Single Family Home(per pt alone. ) Source of Information:  Patient Patient Interpreter Needed:  None Criminal Activity/Legal Involvement Pertinent to Current Situation/Hospitalization:  No - Comment as needed Significant Relationships:  Adult Children, Other Family Members Lives with:  Self Do you feel safe going back to the place where you live?  Yes Need for family participation in patient care:  Yes (Comment)  Care giving concerns:  CSW spoke with pt at bedside. At this time pt expressed no concerns to CSW.    Social Worker assessment / plan:  CSW spoke with pt at bedside. During this time CSW was informed that pt is from home alone. Pt reports that pt has support from other family members (a cousin) that check on pt at times. Pt expressed that pt has a daughter that lives in [place] (not sure) and that she may be coming to see pt once back home. Pt is agreeable to SNF placement at the time of discharge if needed.   Employment status:  Retired Forensic scientist:  Self Pay (Medicaid Pending) PT Recommendations:  Not assessed at this time Information / Referral to community resources:  [place]  Patient/Family's Response to care:  Pt appeared to be understanding and agreeable to plan of care at this time.   Patient/Family's Understanding of and Emotional Response to Diagnosis, Current Treatment, and Prognosis:  No further questions or concerns have been presented to CSW at this time.  Emotional  Assessment Appearance:  Appears stated age Attitude/Demeanor/Rapport:  Engaged Affect (typically observed):    Orientation:  Oriented to Self Alcohol / Substance use:  Not Applicable Psych involvement (Current and /or in the community):  No (Comment)(not at this time.)  Discharge Needs  Concerns to be addressed:  Denies Needs/Concerns at this time Readmission within the last 30 days:  No Current discharge risk:  Dependent with Mobility Barriers to Discharge:  Continued Medical Work up   [employer], [place] 03/22/2017, 10:24 AM

## 2017-03-22 NOTE — Consult Note (Signed)
Cardiology Consultation:   Patient ID: Alyssa Savage; [id number]; 1930/04/19   Admit date: 03/21/2017 Date of Consult: 03/22/2017  Primary Care Provider: Patient, No Pcp Per Primary Cardiologist: New to [facility]    Patient Profile:   Alyssa Savage is a 84 y.o. female with a hx of no significant past medical history injured for evaluation of slurred speech, fall and left hip pain who is being seen today for the evaluation of preoperative clearance at the request of Dr. Posey Pronto.  No prior cardiac history. Patient has not seen a doctor in approximately 60 years.   History of Present Illness:   Ms. Espino presented with multiple complaints. History provided by family at bedside. Patient had a fall approximately 2 weeks ago during grocery shopping. She was walking and suddenly twisted left hip. No loss of consciousness. No prodromes of dizziness, lightheadedness, chest pain or shortness of breath.  She was walking with left hip pain for past 2 weeks.  She lives alone and very independent.  She is very active.  One of family member calls her every day and noted mild slurred speech on Saturday first time.  Sunday was more severe.  However did not pay any attention.  Yesterday patient was unable to stand up and called family member.  Upon arrival she was on floor.  Unable to stand up and severe slurred speech.  EMS was called and brought to ER for further evaluation. R sided weakness.   On arrival creatinine 1.7.  Potassium 2.  CT of the head showed right basal ganglia infract.  Also revealed left femoral neck fracture.  Today MRI showed an acute CVA. Troponin 0.1-->0.12 x2.   She denies any history of chest pain, shortness of breath, palpitation, orthopnea, PND, syncope, lower extremity edema or melena.   Past Medical History:  Diagnosis Date  . GERD (gastroesophageal reflux disease)     History reviewed. No pertinent surgical history.    Inpatient Medications: Scheduled Meds: .  stroke:  mapping our early stages of recovery book   Does not apply Once  . aspirin  300 mg Rectal Daily   Or  . aspirin  325 mg Oral Daily  . atorvastatin  40 mg Oral q1800  . pantoprazole (PROTONIX) IV  40 mg Intravenous QHS  . potassium chloride  40 mEq Oral Once   Continuous Infusions: . dextrose 5 % and 0.45% NaCl Stopped (03/22/17 1022)  . potassium chloride     PRN Meds: acetaminophen **OR** acetaminophen (TYLENOL) oral liquid 160 mg/5 mL **OR** acetaminophen, hydrALAZINE, hydrOXYzine, methocarbamol, morphine injection, nitroGLYCERIN, oxyCODONE-acetaminophen, senna-docusate, zolpidem  Allergies:   No Known Allergies  Social History:   Social History   Socioeconomic History  . Marital status: Widowed    Spouse name: Not on file  . Number of children: Not on file  . Years of education: Not on file  . Highest education level: Not on file  Social Needs  . Financial resource strain: Not on file  . Food insecurity - worry: Not on file  . Food insecurity - inability: Not on file  . Transportation needs - medical: Not on file  . Transportation needs - non-medical: Not on file  Occupational History  . Not on file  Tobacco Use  . Smoking status: Never Smoker  Substance and Sexual Activity  . Alcohol use: No    Frequency: Never  . Drug use: No  . Sexual activity: Not on file  Other Topics Concern  . Not on file  Social  History Narrative  . Not on file    Family History:   Family History  Problem Relation Age of Onset  . CAD Mother   . Heart disease Mother      ROS:  Please see the history of present illness.  All other ROS reviewed and negative.     Physical Exam/Data:   Vitals:   03/22/17 0800 03/22/17 0900 03/22/17 1000 03/22/17 1153  BP: (!) 169/111 (!) 196/118 (!) 206/129 (!) 168/125  Pulse: 85 79 64 81  Resp: (!) 21 (!) 22 18 16   Temp:      TempSrc:      SpO2: 96% 96% 99% 96%    Intake/Output Summary (Last 24 hours) at 03/22/2017 1226 Last data filed at  03/22/2017 0555 Gross per 24 hour  Intake 500 ml  Output -  Net 500 ml   There were no vitals filed for this visit. There is no height or weight on file to calculate BMI.  General: Frail ill appearing thin African-American female with slurred speech HEENT: Drooling face on right side Lymph: no adenopathy Neck: no JVD Endocrine:  No thryomegaly Vascular: No carotid bruits; FA pulses 2+ bilaterally without bruits  Cardiac:  Irregular S1, S2; RRR; no murmur Lungs:  clear to auscultation bilaterally, no wheezing, rhonchi or rales  Abd: soft, nontender, no hepatomegaly  Ext: no edema Musculoskeletal: Right-sided weakness . L hip pain with movement  skin: warm and dry  Neuro: Psych:  Normal affect   EKG:  The EKG was personally reviewed and demonstrates: Sinus rhythm with PACs, non specific st/t wave abnormality  Telemetry:  Telemetry was personally reviewed and demonstrates:  Sinus rhythm at rate of 60s, PACs  Relevant CV Studies: As above  Laboratory Data:  Chemistry Recent Labs  Lab 03/21/17 1950 03/22/17 0345  NA 147* 150*  K <2.0* 2.1*  CL 98* 101  CO2 30 26  GLUCOSE 123* 104*  BUN 25* 29*  CREATININE 1.71* 1.57*  CALCIUM 9.3 8.9  GFRNONAA 26* 29*  GFRAA 30* 34*  ANIONGAP 19* 23*    Recent Labs  Lab 03/21/17 1950  PROT 8.2*  ALBUMIN 3.4*  AST 36  ALT 18  ALKPHOS 85  BILITOT 1.0   Hematology Recent Labs  Lab 03/21/17 1950  WBC 8.6  RBC 4.26  HGB 12.1  HCT 37.3  MCV 87.6  MCH 28.4  MCHC 32.4  RDW 14.0  PLT 427*   Cardiac Enzymes Recent Labs  Lab 03/22/17 0117 03/22/17 0345  TROPONINI 0.12* 0.12*    Recent Labs  Lab 03/21/17 1954  TROPIPOC 0.10*    BNPNo results for input(s): BNP, PROBNP in the last 168 hours.  DDimer No results for input(s): DDIMER in the last 168 hours.  Radiology/Studies:  Dg Chest 1 View  Result Date: 03/21/2017 CLINICAL DATA:  Fall EXAM: CHEST 1 VIEW COMPARISON:  None. FINDINGS: No acute pulmonary infiltrate or  effusion. Mild cardiomegaly. Tortuous ectatic aorta with mild atherosclerosis. No pneumothorax. IMPRESSION: 1. Negative for acute infiltrate or edema 2. Cardiomegaly Electronically Signed   By: Donavan Foil M.D.   On: 03/21/2017 19:50   Dg Pelvis 1-2 Views  Result Date: 03/21/2017 CLINICAL DATA:  Fall with pain EXAM: PELVIS - 1-2 VIEW COMPARISON:  None. FINDINGS: Acute, displaced left femoral neck fracture with mild superior migration of the distal femur. Left femoral head projects in joint. Pubic symphysis and rami are intact. Normal right hip alignment. Multiple large calcified masses within the pelvis, measuring up  to 6.4 cm. IMPRESSION: 1. Acute displaced left femoral neck fracture 2. Multiple large calcified masses in the pelvis, possibly due to large calcified fibroids, CT could better localize the calcifications. Electronically Signed   By: Donavan Foil M.D.   On: 03/21/2017 19:47   Ct Head Wo Contrast  Result Date: 03/21/2017 CLINICAL DATA:  [place] 2 weeks ago, altered mental status. EXAM: CT HEAD WITHOUT CONTRAST TECHNIQUE: Contiguous axial images were obtained from the base of the skull through the vertex without intravenous contrast. COMPARISON:  None. FINDINGS: BRAIN: Patchy hypodense RIGHT basal ganglia. Old LEFT basal ganglia and bilateral thalamus lacunar infarcts. Small area suspected RIGHT frontal lobe encephalomalacia. Patchy to confluent supratentorial white matter hypodensities. No advanced parenchymal brain volume loss for age. No hydrocephalus. No hemorrhage. No abnormal extra-axial fluid collections. VASCULAR: Mild calcific atherosclerosis of the carotid siphons. SKULL: No skull fracture. Anteriorly subluxed mandible condyles presumably from open mouth positioning. No significant scalp soft tissue swelling. SINUSES/ORBITS: The mastoid air-cells and included paranasal sinuses are well-aerated.The included ocular globes and orbital contents are non-suspicious. OTHER: 14 mm single bubble  of gas LEFT infratemporal fossa. IMPRESSION: 1. Acute nonhemorrhagic RIGHT basal ganglia infarct. 2. Old bilateral basal ganglia and thalamus lacunar infarcts. Moderate to severe chronic small vessel ischemic disease. 3. Acute findings discussed with and reconfirmed by Dr.JON KNAPP on 03/21/2017 at 8:44 pm. Electronically Signed   By: Elon Alas M.D.   On: 03/21/2017 20:45   Mr Brain Wo Contrast  Result Date: 03/22/2017 CLINICAL DATA:  [place] 2 weeks ago. Gait disturbance. Slurred speech. Left facial droop. EXAM: MRI HEAD WITHOUT CONTRAST MRA HEAD WITHOUT CONTRAST TECHNIQUE: Multiplanar, multiecho pulse sequences of the brain and surrounding structures were obtained without intravenous contrast. Angiographic images of the head were obtained using MRA technique without contrast. COMPARISON:  CT 03/21/2017 FINDINGS: MRI HEAD FINDINGS Brain: 1.5 x 2 cm region of acute infarction in the posterior basal ganglia/posterior limb internal capsule/radiating white matter tracts on the left. No mass effect or hemorrhage. No other acute infarction. Elsewhere, there are extensive chronic small vessel ischemic changes throughout the pons. No focal cerebellar insult. Chronic small-vessel ischemic changes are present throughout the thalami, basal ganglia and hemispheric white matter. Scattered foci of hemosiderin deposition are present scattered throughout the brain related to the old small vessel insults. No evidence of large vessel territory infarction or mass lesion. No sign of acute hemorrhage. No hydrocephalus or extra-axial collection. Vascular: Major vessels at the base of the brain show flow. Skull and upper cervical spine: Negative Sinuses/Orbits: Clear/normal Other: None MRA HEAD FINDINGS Both internal carotid arteries are patent through the skull base and siphon regions. The anterior and middle cerebral vessels are patent without proximal stenosis. More distal branch vessels show atherosclerotic irregularity and  narrowing, particularly evident in the anterior cerebral arteries. Both vertebral arteries are patent to the basilar. No basilar stenosis. Posterior circulation branch vessels are patent. Pronounced atherosclerotic irregularity of the PCA branches. IMPRESSION: 1.5-2 cm acute infarction in the posterior left basal ganglia, posterior limb internal capsule and radiating white matter tracts. No mass effect or hemorrhage. Widespread chronic small-vessel ischemic changes elsewhere throughout the brain as outlined above. No large vessel occlusion or correctable proximal stenosis. Widespread intracranial medium to small vessel atherosclerotic narrowing and irregularity. Electronically Signed   By: Nelson Chimes M.D.   On: 03/22/2017 11:49   Mr Jodene Nam Head Wo Contrast  Result Date: 03/22/2017 CLINICAL DATA:  [place] 2 weeks ago. Gait disturbance. Slurred speech. Left facial  droop. EXAM: MRI HEAD WITHOUT CONTRAST MRA HEAD WITHOUT CONTRAST TECHNIQUE: Multiplanar, multiecho pulse sequences of the brain and surrounding structures were obtained without intravenous contrast. Angiographic images of the head were obtained using MRA technique without contrast. COMPARISON:  CT 03/21/2017 FINDINGS: MRI HEAD FINDINGS Brain: 1.5 x 2 cm region of acute infarction in the posterior basal ganglia/posterior limb internal capsule/radiating white matter tracts on the left. No mass effect or hemorrhage. No other acute infarction. Elsewhere, there are extensive chronic small vessel ischemic changes throughout the pons. No focal cerebellar insult. Chronic small-vessel ischemic changes are present throughout the thalami, basal ganglia and hemispheric white matter. Scattered foci of hemosiderin deposition are present scattered throughout the brain related to the old small vessel insults. No evidence of large vessel territory infarction or mass lesion. No sign of acute hemorrhage. No hydrocephalus or extra-axial collection. Vascular: Major vessels at  the base of the brain show flow. Skull and upper cervical spine: Negative Sinuses/Orbits: Clear/normal Other: None MRA HEAD FINDINGS Both internal carotid arteries are patent through the skull base and siphon regions. The anterior and middle cerebral vessels are patent without proximal stenosis. More distal branch vessels show atherosclerotic irregularity and narrowing, particularly evident in the anterior cerebral arteries. Both vertebral arteries are patent to the basilar. No basilar stenosis. Posterior circulation branch vessels are patent. Pronounced atherosclerotic irregularity of the PCA branches. IMPRESSION: 1.5-2 cm acute infarction in the posterior left basal ganglia, posterior limb internal capsule and radiating white matter tracts. No mass effect or hemorrhage. Widespread chronic small-vessel ischemic changes elsewhere throughout the brain as outlined above. No large vessel occlusion or correctable proximal stenosis. Widespread intracranial medium to small vessel atherosclerotic narrowing and irregularity. Electronically Signed   By: Nelson Chimes M.D.   On: 03/22/2017 11:49   Dg Femur Min 2 Views Left  Result Date: 03/21/2017 CLINICAL DATA:  Fall with pain EXAM: LEFT FEMUR 2 VIEWS COMPARISON:  None. FINDINGS: Acute displaced left femoral neck fracture with mild varus angulation. No femoral head dislocation. Partially visible calcified pelvic masses. Distal femur appears intact. Degenerative changes at the knee IMPRESSION: Acute displaced left femoral neck fracture Electronically Signed   By: Donavan Foil M.D.   On: 03/21/2017 19:47    Assessment and Plan:   84 year old AA female with no past medical history presented with 2-weeks history of fall and left hip pain.  2 days history of slurred speech and right-sided weakness.  Workup revealed left femoral neck fracture.  Acute CVA. Also has electrolytes abnormality. Persistent hypokalemia despite supplementation. Hypernutremia with Na of 150. Scr  improving from 1.7--->1.57 with presume CKD stage III.  1. Pre-operative clearance - She will need neurology clearance first given acute stroke. Cardiac clearance pending echocardiogram. No s/s concerning for angina or CHF. Wait echo.  2. Elevated troponin - likely demand in acute setting.   3. Hypokalemia with hypernatremia - Correct any electrolytes abnormality.  4. CVA - Neurology on board. Allow permissive hypertension.   For questions or updates, please contact [place] Please consult [email or web address] for contact info under Cardiology/STEMI.   Jarrett Soho, [place]  03/22/2017 12:26 PM   History and all data above reviewed.  Patient examined.  I agree with the findings as above.  The patient has no past cardiac history.  She has no history of chest pain.  She denies palpitations presyncope or syncope.  She is admitted with CVA.  She also has a hip fracture and will need surgery.  Her trop is elevated.  EKG demonstrates no acute ST T wave changes.  The patient exam reveals COR:RRR  ,  Lungs: Clear  ,  Abd: Positive bowel sounds, no rebound no guarding, Ext No edema  .  All available labs, radiology testing, previous records reviewed.   EKG NSR, QT prolonged.  Bifid T wave.  Agree with documented assessment and plan. Preop:  Without acute symptoms or EKG changes in a previously highly functional [place] she will likely be at acceptable risk for surgery from a cardiac standpoint.  However, I would like to see the echo and she will need neurology evaluation preop.  She has accelerated HTN and is NPO.  Currently getting PRN hydralazine and labetalol.  She might need continuous IV therapy if her BP continues to increase.   There is a need for permissive HTN as well.  I will defer BP management to the primary team.    Minus Breeding  3:01 PM  03/22/2017

## 2017-03-22 NOTE — Progress Notes (Signed)
Critical K+ from lab 2.4. MD notified by text page, no new orders

## 2017-03-22 NOTE — Progress Notes (Signed)
[facility] Progress Note  Patient: Alyssa Savage ZOX:[id number]   PCP: Patient, No Pcp Per DOB: 1930/11/23   DOA: 03/21/2017   DOS: 03/22/2017   Date of Service: the patient was seen and examined on 03/22/2017  Subjective: feeling better, still has difficulty speaking.  Also complains about pain in her left leg.  No nausea no vomiting no fever no chills.  Brief hospital course: Pt. with no known PMH ; admitted on 03/21/2017, presented with complaint of fall, was found to have acute stroke as well as left hip fracture. Currently further plan is repeat stroke workup as well as follow orthopedic recommendation.  Assessment and Plan: Stroke [facility]):   Head CT was performed which shows right basal ganglia stroke MRI brain shows 2 cm acute infarction in posterior left basal ganglia as well as posterior left internal capsule radiating to white matter tract. No mass or hemorrhage. Chronic small vessel disease, no significant large vessel disease on MRI. Blood pressure significantly elevated but will allow permissive HTN in the setting of acute stroke Check carotid dopplers  ASA and lipitor LDL 117, hemoglobin A1c 5.9. 2D transthoracic echocardiography pending PT/OT consult when able to SLP  Fall and fracture of femoral neck, left, closed [facility]): Orthopedic surgeon, Dr.Swinteck was consulted. - Pain control: morphine prn and percocet - When necessary Zofran for nausea - Robaxin for muscle spasm -Biggest issue is medical clearance.  Discussed with neurology.  Their concern is worsening of the stroke intraoperatively as well as postoperatively due to hypotension as well as other physiological changes that can happen during the surgery. At present recommended discussed with family, if they are willing to understand the risk and still would like to go for the surgery patient can go for the surgery. Informed orthopedic doctors.  AKI (acute kidney injury) [facility]): cre 1.71 and BNU 25. Likely  due to prerenal secondary to dehydration - IVF: D5-1/2 NS at 75 cc/h  Hypertensive emergency:  -prn hydralazine for SBP>220  Elevated troponin: trop 0.10. No CP. Likely due to demand ischemia secondary to hypertension and acute stroke. - cycle CE q6 x3 and repeat EKG in the am  - prn Nitroglycerin, Morphine, and aspirin, lipitor  - Risk factor stratification: will check FLP, UDS and A1C  - 2d echo -inpt card consult was requested via Epic  Hypokalemia: K= 2.0  on admission. Continue monitoring.  GERD (gastroesophageal reflux disease) -protonix  Diet: N.p.o. pending swallowing evaluation DVT Prophylaxis: subcutaneous Heparin Advance goals of care discussion: Full code  Family Communication: no family was present at bedside, at the time of interview.  Disposition:  Discharge to be determined.   Consultants: Neurology, orthopedics Procedures: Echocardiogram  Antibiotics: Anti-infectives (From admission, onward)   None       Objective: Physical Exam: Vitals:   03/22/17 1153 03/22/17 1233 03/22/17 1550 03/22/17 2027  BP: (!) 168/125 (!) 226/111 (!) 212/122 (!) 203/93  Pulse: 81 88 96 85  Resp: 16 18 19 18   Temp:  98.6 F (37 C) 97.8 F (36.6 C) 98.7 F (37.1 C)  TempSrc:  Axillary Axillary Oral  SpO2: 96%  98% 100%  Weight:  41.1 kg (90 lb 9.7 oz)    Height:  5' (1.524 m)      Intake/Output Summary (Last 24 hours) at 03/22/2017 2032 Last data filed at 03/22/2017 0555 Gross per 24 hour  Intake 500 ml  Output -  Net 500 ml   Filed Weights   03/22/17 1233  Weight: 41.1 kg (90 lb  9.7 oz)   General: Alert, Awake and Oriented to Time, Place and [place]. Appear in mild distress, affect appropriate Eyes: PERRL, Conjunctiva normal ENT: Oral Mucosa clear moist. Neck: no JVD, no Abnormal Mass Or lumps Cardiovascular: S1 and S2 Present, no Murmur, Peripheral Pulses Present Respiratory: normal respiratory effort, Bilateral Air entry equal and Decreased, no use of  accessory muscle, Clear to Auscultation, no Crackles, no wheezes Abdomen: Bowel Sound present, Soft and no tenderness, no hernia Skin: no redness, no Rash, no induration Extremities: no Pedal edema, no calf tenderness Neurologic: A aphasia as well as dysarthria.  Right-sided weakness.  Data Reviewed: CBC: Recent Labs  Lab 03/21/17 1950  WBC 8.6  NEUTROABS 7.4  HGB 12.1  HCT 37.3  MCV 87.6  PLT 086*   Basic Metabolic Panel: Recent Labs  Lab 03/21/17 1950 03/22/17 0345 03/22/17 1610  NA 147* 150* 148*  K <2.0* 2.1* 2.4*  CL 98* 101 104  CO2 30 26 27   GLUCOSE 123* 104* 112*  BUN 25* 29* 25*  CREATININE 1.71* 1.57* 1.55*  CALCIUM 9.3 8.9 8.9  MG 1.9 2.2  --     Liver Function Tests: Recent Labs  Lab 03/21/17 1950  AST 36  ALT 18  ALKPHOS 85  BILITOT 1.0  PROT 8.2*  ALBUMIN 3.4*   No results for input(s): LIPASE, AMYLASE in the last 168 hours. No results for input(s): AMMONIA in the last 168 hours. Coagulation Profile: Recent Labs  Lab 03/21/17 1950  INR 0.99   Cardiac Enzymes: Recent Labs  Lab 03/22/17 0117 03/22/17 0345 03/22/17 1418  TROPONINI 0.12* 0.12* 0.12*   BNP (last 3 results) No results for input(s): PROBNP in the last 8760 hours. CBG: No results for input(s): GLUCAP in the last 168 hours. Studies: Mr Brain Wo Contrast  Result Date: 03/22/2017 CLINICAL DATA:  [place] 2 weeks ago. Gait disturbance. Slurred speech. Left facial droop. EXAM: MRI HEAD WITHOUT CONTRAST MRA HEAD WITHOUT CONTRAST TECHNIQUE: Multiplanar, multiecho pulse sequences of the brain and surrounding structures were obtained without intravenous contrast. Angiographic images of the head were obtained using MRA technique without contrast. COMPARISON:  CT 03/21/2017 FINDINGS: MRI HEAD FINDINGS Brain: 1.5 x 2 cm region of acute infarction in the posterior basal ganglia/posterior limb internal capsule/radiating white matter tracts on the left. No mass effect or hemorrhage. No other  acute infarction. Elsewhere, there are extensive chronic small vessel ischemic changes throughout the pons. No focal cerebellar insult. Chronic small-vessel ischemic changes are present throughout the thalami, basal ganglia and hemispheric white matter. Scattered foci of hemosiderin deposition are present scattered throughout the brain related to the old small vessel insults. No evidence of large vessel territory infarction or mass lesion. No sign of acute hemorrhage. No hydrocephalus or extra-axial collection. Vascular: Major vessels at the base of the brain show flow. Skull and upper cervical spine: Negative Sinuses/Orbits: Clear/normal Other: None MRA HEAD FINDINGS Both internal carotid arteries are patent through the skull base and siphon regions. The anterior and middle cerebral vessels are patent without proximal stenosis. More distal branch vessels show atherosclerotic irregularity and narrowing, particularly evident in the anterior cerebral arteries. Both vertebral arteries are patent to the basilar. No basilar stenosis. Posterior circulation branch vessels are patent. Pronounced atherosclerotic irregularity of the PCA branches. IMPRESSION: 1.5-2 cm acute infarction in the posterior left basal ganglia, posterior limb internal capsule and radiating white matter tracts. No mass effect or hemorrhage. Widespread chronic small-vessel ischemic changes elsewhere throughout the brain as outlined above. No  large vessel occlusion or correctable proximal stenosis. Widespread intracranial medium to small vessel atherosclerotic narrowing and irregularity. Electronically Signed   By: Nelson Chimes M.D.   On: 03/22/2017 11:49   Mr Jodene Nam Head Wo Contrast  Result Date: 03/22/2017 CLINICAL DATA:  [place] 2 weeks ago. Gait disturbance. Slurred speech. Left facial droop. EXAM: MRI HEAD WITHOUT CONTRAST MRA HEAD WITHOUT CONTRAST TECHNIQUE: Multiplanar, multiecho pulse sequences of the brain and surrounding structures were obtained  without intravenous contrast. Angiographic images of the head were obtained using MRA technique without contrast. COMPARISON:  CT 03/21/2017 FINDINGS: MRI HEAD FINDINGS Brain: 1.5 x 2 cm region of acute infarction in the posterior basal ganglia/posterior limb internal capsule/radiating white matter tracts on the left. No mass effect or hemorrhage. No other acute infarction. Elsewhere, there are extensive chronic small vessel ischemic changes throughout the pons. No focal cerebellar insult. Chronic small-vessel ischemic changes are present throughout the thalami, basal ganglia and hemispheric white matter. Scattered foci of hemosiderin deposition are present scattered throughout the brain related to the old small vessel insults. No evidence of large vessel territory infarction or mass lesion. No sign of acute hemorrhage. No hydrocephalus or extra-axial collection. Vascular: Major vessels at the base of the brain show flow. Skull and upper cervical spine: Negative Sinuses/Orbits: Clear/normal Other: None MRA HEAD FINDINGS Both internal carotid arteries are patent through the skull base and siphon regions. The anterior and middle cerebral vessels are patent without proximal stenosis. More distal branch vessels show atherosclerotic irregularity and narrowing, particularly evident in the anterior cerebral arteries. Both vertebral arteries are patent to the basilar. No basilar stenosis. Posterior circulation branch vessels are patent. Pronounced atherosclerotic irregularity of the PCA branches. IMPRESSION: 1.5-2 cm acute infarction in the posterior left basal ganglia, posterior limb internal capsule and radiating white matter tracts. No mass effect or hemorrhage. Widespread chronic small-vessel ischemic changes elsewhere throughout the brain as outlined above. No large vessel occlusion or correctable proximal stenosis. Widespread intracranial medium to small vessel atherosclerotic narrowing and irregularity.  Electronically Signed   By: Nelson Chimes M.D.   On: 03/22/2017 11:49    Scheduled Meds: . aspirin  300 mg Rectal Daily   Or  . aspirin  325 mg Oral Daily  . atorvastatin  40 mg Oral q1800  . pantoprazole (PROTONIX) IV  40 mg Intravenous QHS   Continuous Infusions: . dextrose 5 % and 0.45 % NaCl with KCl 20 mEq/L 75 mL/hr at 03/22/17 1948  . potassium chloride 10 mEq (03/22/17 2027)   PRN Meds: acetaminophen **OR** acetaminophen (TYLENOL) oral liquid 160 mg/5 mL **OR** acetaminophen, hydrALAZINE, hydrOXYzine, methocarbamol, morphine injection, nitroGLYCERIN, oxyCODONE-acetaminophen, senna-docusate, zolpidem  Time spent: 35 minutes  Author: Berle Mull, MD [facility] Pager: [phone] 03/22/2017 8:32 PM  If 7PM-7AM, please contact night-coverage at [email or web address], password [facility]

## 2017-03-22 NOTE — Consult Note (Addendum)
Reason for Consult:Hip fx Referring Physician: P Araina Butrick [name] is an 84 y.o. female.  HPI: Alyssa Savage suffered an acute stroke, likely yesterday or the night before. A friend noted her slurred speech and convinced her to seek help. She c/o left hip pain that started in the store but she denies that she fell. She is quite dysarthric and most of the history was supplied by a friend in the room. She has a dtr who is coming from [place], probably tomorrow. Aside from eye checks she has not been to a doctor in >60 years.  Past Medical History:  Diagnosis Date  . GERD (gastroesophageal reflux disease)     History reviewed. No pertinent surgical history.  Family History  Problem Relation Age of Onset  . CAD Mother   . Heart disease Mother     Social History:  reports that  has never smoked. She does not have any smokeless tobacco history on file. She reports that she does not drink alcohol or use drugs.  Allergies: No Known Allergies  Medications: I have reviewed the patient's current medications.  Results for orders placed or performed during the hospital encounter of 03/21/17 (from the past 48 hour(s))  Ethanol     Status: None   Collection Time: 03/21/17  7:49 PM  Result Value Ref Range   Alcohol, Ethyl (B) <10 <10 mg/dL    Comment:        LOWEST DETECTABLE LIMIT FOR SERUM ALCOHOL IS 10 mg/dL FOR MEDICAL PURPOSES ONLY Performed at [facility] [address], [place], [place] [address]   Protime-INR     Status: None   Collection Time: 03/21/17  7:50 PM  Result Value Ref Range   Prothrombin Time 13.0 11.4 - 15.2 seconds   INR 0.99     Comment: Performed at [facility] [address], [place], [place] [address]  APTT     Status: None   Collection Time: 03/21/17  7:50 PM  Result Value Ref Range   aPTT 31 24 - 36 seconds    Comment: Performed at [facility] [address] [place], [place] [address]  CBC     Status: Abnormal   Collection Time: 03/21/17   7:50 PM  Result Value Ref Range   WBC 8.6 4.0 - 10.5 K/uL   RBC 4.26 3.87 - 5.11 MIL/uL   Hemoglobin 12.1 12.0 - 15.0 g/dL   HCT 37.3 36.0 - 46.0 %   MCV 87.6 78.0 - 100.0 fL   MCH 28.4 26.0 - 34.0 pg   MCHC 32.4 30.0 - 36.0 g/dL   RDW 14.0 11.5 - 15.5 %   Platelets 427 (H) 150 - 400 K/uL    Comment: Performed at [facility] [address], [place], [place] [address]  Differential     Status: None   Collection Time: 03/21/17  7:50 PM  Result Value Ref Range   Neutrophils Relative % 86 %   Neutro Abs 7.4 1.7 - 7.7 K/uL   Lymphocytes Relative 8 %   Lymphs Abs 0.7 0.7 - 4.0 K/uL   Monocytes Relative 6 %   Monocytes Absolute 0.5 0.1 - 1.0 K/uL   Eosinophils Relative 0 %   Eosinophils Absolute 0.0 0.0 - 0.7 K/uL   Basophils Relative 0 %   Basophils Absolute 0.0 0.0 - 0.1 K/uL    Comment: Performed at [facility] [address], [place], [place] [address]  Comprehensive  metabolic panel     Status: Abnormal   Collection Time: 03/21/17  7:50 PM  Result Value Ref Range   Sodium 147 (H) 135 - 145 mmol/L   Potassium <2.0 (LL) 3.5 - 5.1 mmol/L    Comment: REPEATED TO VERIFY CRITICAL RESULT CALLED TO, READ BACK BY AND VERIFIED WITH: [place] [facility] 03/21/17 2144 WAYK    Chloride 98 (L) 101 - 111 mmol/L   CO2 30 22 - 32 mmol/L   Glucose, Bld 123 (H) 65 - 99 mg/dL   BUN 25 (H) 6 - 20 mg/dL   Creatinine, Ser 1.71 (H) 0.44 - 1.00 mg/dL   Calcium 9.3 8.9 - 10.3 mg/dL   Total Protein 8.2 (H) 6.5 - 8.1 g/dL   Albumin 3.4 (L) 3.5 - 5.0 g/dL   AST 36 15 - 41 U/L   ALT 18 14 - 54 U/L   Alkaline Phosphatase 85 38 - 126 U/L   Total Bilirubin 1.0 0.3 - 1.2 mg/dL   GFR calc non Af Amer 26 (L) >60 mL/min   GFR calc Af Amer 30 (L) >60 mL/min    Comment: (NOTE) The eGFR has been calculated using the CKD EPI equation. This calculation has not been validated in all clinical situations. eGFR's persistently <60 mL/min signify possible Chronic Kidney Disease.    Anion gap 19 (H) 5 -  15    Comment: Performed at [facility], [place] [address], [place], [place] [address]  Magnesium     Status: None   Collection Time: 03/21/17  7:50 PM  Result Value Ref Range   Magnesium 1.9 1.7 - 2.4 mg/dL    Comment: Performed at [facility], [place] [address], [place], [place] [address]  I-stat troponin, ED (not at [facility])     Status: Abnormal   Collection Time: 03/21/17  7:54 PM  Result Value Ref Range   Troponin i, poc 0.10 (HH) 0.00 - 0.08 ng/mL   Comment NOTIFIED PHYSICIAN    Comment 3            Comment: Due to the release kinetics of cTnI, a negative result within the first hours of the onset of symptoms does not rule out myocardial infarction with certainty. If myocardial infarction is still suspected, repeat the test at appropriate intervals.   Troponin I (q 6hr x 3)     Status: Abnormal   Collection Time: 03/22/17  1:17 AM  Result Value Ref Range   Troponin I 0.12 (HH) <0.03 ng/mL    Comment: CRITICAL RESULT CALLED TO, READ BACK BY AND VERIFIED WITH: T.JORDAN,RN 0206 03/22/17 M.CAMPBELL Performed at [facility], [place] [address] [place], [place] [address]   Type and screen [place]     Status: None   Collection Time: 03/22/17  1:25 AM  Result Value Ref Range   ABO/RH(D) B POS    Antibody Screen NEG    Sample Expiration      03/25/2017 Performed at [facility], [place] [address] [place], [place] [address]   ABO/Rh     Status: None   Collection Time: 03/22/17  1:25 AM  Result Value Ref Range   ABO/RH(D)      B POS Performed at [facility] [address], [place], [place] [address]   Hemoglobin A1c     Status: Abnormal   Collection Time: 03/22/17  3:44 AM  Result Value Ref Range   Hgb A1c MFr Bld  6.3 (H) 4.8 - 5.6 %    Comment: (NOTE) Pre diabetes:          5.7%-6.4% Diabetes:              >6.4% Glycemic control for   <7.0% adults with diabetes    Mean Plasma Glucose 134.11 mg/dL    Comment: Performed  at [facility], [place] [address] [place], [place] [address]  Lipid panel     Status: Abnormal   Collection Time: 03/22/17  3:44 AM  Result Value Ref Range   Cholesterol 203 (H) 0 - 200 mg/dL   Triglycerides 90 <150 mg/dL   HDL 68 >40 mg/dL   Total CHOL/HDL Ratio 3.0 RATIO   VLDL 18 0 - 40 mg/dL   LDL Cholesterol 117 (H) 0 - 99 mg/dL    Comment:        Total Cholesterol/HDL:CHD Risk Coronary Heart Disease Risk Table                     Men   Women  1/2 Average Risk   3.4   3.3  Average Risk       5.0   4.4  2 X Average Risk   9.6   7.1  3 X Average Risk  23.4   11.0        Use the calculated Patient Ratio above and the CHD Risk Table to determine the patient's CHD Risk.        ATP III CLASSIFICATION (LDL):  <100     mg/dL   Optimal  100-129  mg/dL   Near or Above                    Optimal  130-159  mg/dL   Borderline  160-189  mg/dL   High  >190     mg/dL   Very High Performed at [facility] [address], [place], [place] [address]   Troponin I (q 6hr x 3)     Status: Abnormal   Collection Time: 03/22/17  3:45 AM  Result Value Ref Range   Troponin I 0.12 (HH) <0.03 ng/mL    Comment: CRITICAL VALUE NOTED.  VALUE IS CONSISTENT WITH PREVIOUSLY REPORTED AND CALLED VALUE. Performed at [facility], [place] [address], [place], [place] [address]     Dg Chest 1 View  Result Date: 03/21/2017 CLINICAL DATA:  Fall EXAM: CHEST 1 VIEW COMPARISON:  None. FINDINGS: No acute pulmonary infiltrate or effusion. Mild cardiomegaly. Tortuous ectatic aorta with mild atherosclerosis. No pneumothorax. IMPRESSION: 1. Negative for acute infiltrate or edema 2. Cardiomegaly Electronically Signed   By: Donavan Foil M.D.   On: 03/21/2017 19:50   Dg Pelvis 1-2 Views  Result Date: 03/21/2017 CLINICAL DATA:  Fall with pain EXAM: PELVIS - 1-2 VIEW COMPARISON:  None. FINDINGS: Acute, displaced left femoral neck fracture with mild superior migration of the distal femur. Left femoral  head projects in joint. Pubic symphysis and rami are intact. Normal right hip alignment. Multiple large calcified masses within the pelvis, measuring up to 6.4 cm. IMPRESSION: 1. Acute displaced left femoral neck fracture 2. Multiple large calcified masses in the pelvis, possibly due to large calcified fibroids, CT could better localize the calcifications. Electronically Signed   By: Donavan Foil M.D.   On: 03/21/2017 19:47   Ct Head Wo Contrast  Result Date: 03/21/2017 CLINICAL DATA:  [place] 2 weeks ago, altered mental status. EXAM:  CT HEAD WITHOUT CONTRAST TECHNIQUE: Contiguous axial images were obtained from the base of the skull through the vertex without intravenous contrast. COMPARISON:  None. FINDINGS: BRAIN: Patchy hypodense RIGHT basal ganglia. Old LEFT basal ganglia and bilateral thalamus lacunar infarcts. Small area suspected RIGHT frontal lobe encephalomalacia. Patchy to confluent supratentorial white matter hypodensities. No advanced parenchymal brain volume loss for age. No hydrocephalus. No hemorrhage. No abnormal extra-axial fluid collections. VASCULAR: Mild calcific atherosclerosis of the carotid siphons. SKULL: No skull fracture. Anteriorly subluxed mandible condyles presumably from open mouth positioning. No significant scalp soft tissue swelling. SINUSES/ORBITS: The mastoid air-cells and included paranasal sinuses are well-aerated.The included ocular globes and orbital contents are non-suspicious. OTHER: 14 mm single bubble of gas LEFT infratemporal fossa. IMPRESSION: 1. Acute nonhemorrhagic RIGHT basal ganglia infarct. 2. Old bilateral basal ganglia and thalamus lacunar infarcts. Moderate to severe chronic small vessel ischemic disease. 3. Acute findings discussed with and reconfirmed by Dr.JON KNAPP on 03/21/2017 at 8:44 pm. Electronically Signed   By: Elon Alas M.D.   On: 03/21/2017 20:45   Dg Femur Min 2 Views Left  Result Date: 03/21/2017 CLINICAL DATA:  Fall with pain EXAM:  LEFT FEMUR 2 VIEWS COMPARISON:  None. FINDINGS: Acute displaced left femoral neck fracture with mild varus angulation. No femoral head dislocation. Partially visible calcified pelvic masses. Distal femur appears intact. Degenerative changes at the knee IMPRESSION: Acute displaced left femoral neck fracture Electronically Signed   By: Donavan Foil M.D.   On: 03/21/2017 19:47    Review of Systems  Unable to perform ROS: Patient nonverbal  Musculoskeletal: Positive for joint pain (Left hip).   Blood pressure (!) 169/111, pulse 85, temperature 97.7 F (36.5 C), temperature source Oral, resp. rate (!) 21, SpO2 96 %. Physical Exam  Constitutional: She appears well-developed and well-nourished. No distress.  HENT:  Head: Normocephalic and atraumatic.  Eyes: Conjunctivae are normal. Right eye exhibits no discharge. Left eye exhibits no discharge. No scleral icterus.  Neck: Normal range of motion.  Cardiovascular: Normal rate and regular rhythm.  Respiratory: Effort normal. No respiratory distress.  Musculoskeletal:  RLE No traumatic wounds, ecchymosis, or rash  Nontender  No knee or ankle effusion  Knee stable to varus/ valgus and anterior/posterior stress  Sens DPN, SPN, TN intact  Motor EHL, ext, flex, evers 0/5  DP 1+, PT 0, No significant edema  LLE No traumatic wounds, ecchymosis, or rash  TTP hip  No knee or ankle effusion  Knee stable to varus/ valgus and anterior/posterior stress  Sens DPN, SPN, TN intact  Motor EHL, ext, flex, evers 5/5  DP 2+, PT 0, No significant edema  Neurological: She is alert.  Skin: Skin is warm and dry. She is not diaphoretic.  Psychiatric: She has a normal mood and affect. Her behavior is normal.    Assessment/Plan: Left femoral neck fx -- Will need hemi vs THA by Dr. Lyla Glassing when cleared by neurology. Will make NPO after MN tonight just in case and reevaluate in the AM. NWB LLE. CVA -- per neurology, await surgical clearance    Lisette Abu, PA-C Orthopedic Surgery [phone] 03/22/2017, 9:04 AM

## 2017-03-22 NOTE — Progress Notes (Signed)
  Echocardiogram 2D Echocardiogram has been performed.  Alyssa Savage 03/22/2017, 2:37 PM

## 2017-03-22 NOTE — H&P (View-Only) (Signed)
Reason for Consult:Hip fx Referring Physician: P Patel  Alyssa Savage is an 84 y.o. female.  HPI: Alyssa Savage suffered an acute stroke, likely yesterday or the night before. A friend noted her slurred speech and convinced her to seek help. Alyssa Savage c/o left hip pain that started in the store but Alyssa Savage denies that Alyssa Savage fell. Alyssa Savage is quite dysarthric and most of the history was supplied by a friend in the room. Alyssa Savage has a dtr who is coming from [place], probably tomorrow. Aside from eye checks Alyssa Savage has not been to a doctor in >60 years.  Past Medical History:  Diagnosis Date  . GERD (gastroesophageal reflux disease)     History reviewed. No pertinent surgical history.  Family History  Problem Relation Age of Onset  . CAD Mother   . Heart disease Mother     Social History:  reports that  has never smoked. Alyssa Savage does not have any smokeless tobacco history on file. Alyssa Savage reports that Alyssa Savage does not drink alcohol or use drugs.  Allergies: No Known Allergies  Medications: I have reviewed the patient's current medications.  Results for orders placed or performed during the hospital encounter of 03/21/17 (from the past 48 hour(s))  Ethanol     Status: None   Collection Time: 03/21/17  7:49 PM  Result Value Ref Range   Alcohol, Ethyl (B) <10 <10 mg/dL    Comment:        LOWEST DETECTABLE LIMIT FOR SERUM ALCOHOL IS 10 mg/dL FOR MEDICAL PURPOSES ONLY Performed at [facility], [address] [place], [place] [address]   Protime-INR     Status: None   Collection Time: 03/21/17  7:50 PM  Result Value Ref Range   Prothrombin Time 13.0 11.4 - 15.2 seconds   INR 0.99     Comment: Performed at [facility], [address] [place], [place] [address]  APTT     Status: None   Collection Time: 03/21/17  7:50 PM  Result Value Ref Range   aPTT 31 24 - 36 seconds    Comment: Performed at [facility], [address] [place], [place] [address]  CBC     Status: Abnormal   Collection Time: 03/21/17   7:50 PM  Result Value Ref Range   WBC 8.6 4.0 - 10.5 K/uL   RBC 4.26 3.87 - 5.11 MIL/uL   Hemoglobin 12.1 12.0 - 15.0 g/dL   HCT 37.3 36.0 - 46.0 %   MCV 87.6 78.0 - 100.0 fL   MCH 28.4 26.0 - 34.0 pg   MCHC 32.4 30.0 - 36.0 g/dL   RDW 14.0 11.5 - 15.5 %   Platelets 427 (H) 150 - 400 K/uL    Comment: Performed at [facility], [address] [place], [place] [address]  Differential     Status: None   Collection Time: 03/21/17  7:50 PM  Result Value Ref Range   Neutrophils Relative % 86 %   Neutro Abs 7.4 1.7 - 7.7 K/uL   Lymphocytes Relative 8 %   Lymphs Abs 0.7 0.7 - 4.0 K/uL   Monocytes Relative 6 %   Monocytes Absolute 0.5 0.1 - 1.0 K/uL   Eosinophils Relative 0 %   Eosinophils Absolute 0.0 0.0 - 0.7 K/uL   Basophils Relative 0 %   Basophils Absolute 0.0 0.0 - 0.1 K/uL    Comment: Performed at [facility], [address] [place], [place] [address]  Comprehensive   metabolic panel     Status: Abnormal   Collection Time: 03/21/17  7:50 PM  Result Value Ref Range   Sodium 147 (H) 135 - 145 mmol/L   Potassium <2.0 (LL) 3.5 - 5.1 mmol/L    Comment: REPEATED TO VERIFY CRITICAL RESULT CALLED TO, READ BACK BY AND VERIFIED WITH: JORDAN T,RN 03/21/17 2144 WAYK    Chloride 98 (L) 101 - 111 mmol/L   CO2 30 22 - 32 mmol/L   Glucose, Bld 123 (H) 65 - 99 mg/dL   BUN 25 (H) 6 - 20 mg/dL   Creatinine, Ser 1.71 (H) 0.44 - 1.00 mg/dL   Calcium 9.3 8.9 - 10.3 mg/dL   Total Protein 8.2 (H) 6.5 - 8.1 g/dL   Albumin 3.4 (L) 3.5 - 5.0 g/dL   AST 36 15 - 41 U/L   ALT 18 14 - 54 U/L   Alkaline Phosphatase 85 38 - 126 U/L   Total Bilirubin 1.0 0.3 - 1.2 mg/dL   GFR calc non Af Amer 26 (L) >60 mL/min   GFR calc Af Amer 30 (L) >60 mL/min    Comment: (NOTE) The eGFR has been calculated using the CKD EPI equation. This calculation has not been validated in all clinical situations. eGFR's persistently <60 mL/min signify possible Chronic Kidney Disease.    Anion gap 19 (H) 5 -  15    Comment: Performed at [facility], [address] [place], [place] [address]  Magnesium     Status: None   Collection Time: 03/21/17  7:50 PM  Result Value Ref Range   Magnesium 1.9 1.7 - 2.4 mg/dL    Comment: Performed at [facility], [address] [place], [place] [address]  I-stat troponin, ED (not at [facility], [facility])     Status: Abnormal   Collection Time: 03/21/17  7:54 PM  Result Value Ref Range   Troponin i, poc 0.10 (HH) 0.00 - 0.08 ng/mL   Comment NOTIFIED PHYSICIAN    Comment 3            Comment: Due to the release kinetics of cTnI, a negative result within the first hours of the onset of symptoms does not rule out myocardial infarction with certainty. If myocardial infarction is still suspected, repeat the test at appropriate intervals.   Troponin I (q 6hr x 3)     Status: Abnormal   Collection Time: 03/22/17  1:17 AM  Result Value Ref Range   Troponin I 0.12 (HH) <0.03 ng/mL    Comment: CRITICAL RESULT CALLED TO, READ BACK BY AND VERIFIED WITH: T.JORDAN,RN 0206 03/22/17 M.CAMPBELL Performed at [facility], [address] [place], [place] [address]   Type and screen [facility]     Status: None   Collection Time: 03/22/17  1:25 AM  Result Value Ref Range   ABO/RH(D) B POS    Antibody Screen NEG    Sample Expiration      03/25/2017 Performed at [facility], [address] [place], [place] [address]   ABO/Rh     Status: None   Collection Time: 03/22/17  1:25 AM  Result Value Ref Range   ABO/RH(D)      B POS Performed at [facility], [address] [place], [place] [address]   Hemoglobin A1c     Status: Abnormal   Collection Time: 03/22/17  3:44 AM  Result Value Ref Range   Hgb A1c MFr Bld   6.3 (H) 4.8 - 5.6 %    Comment: (NOTE) Pre diabetes:          5.7%-6.4% Diabetes:              >6.4% Glycemic control for   <7.0% adults with diabetes    Mean Plasma Glucose 134.11 mg/dL    Comment: Performed  at [facility], [place] [address] [place], [place] [address]  Lipid panel     Status: Abnormal   Collection Time: 03/22/17  3:44 AM  Result Value Ref Range   Cholesterol 203 (H) 0 - 200 mg/dL   Triglycerides 90 <150 mg/dL   HDL 68 >40 mg/dL   Total CHOL/HDL Ratio 3.0 RATIO   VLDL 18 0 - 40 mg/dL   LDL Cholesterol 117 (H) 0 - 99 mg/dL    Comment:        Total Cholesterol/HDL:CHD Risk Coronary Heart Disease Risk Table                     Men   Women  1/2 Average Risk   3.4   3.3  Average Risk       5.0   4.4  2 X Average Risk   9.6   7.1  3 X Average Risk  23.4   11.0        Use the calculated Patient Ratio above and the CHD Risk Table to determine the patient's CHD Risk.        ATP III CLASSIFICATION (LDL):  <100     mg/dL   Optimal  100-129  mg/dL   Near or Above                    Optimal  130-159  mg/dL   Borderline  160-189  mg/dL   High  >190     mg/dL   Very High Performed at [facility] [address], [place], [place] [address]   Troponin I (q 6hr x 3)     Status: Abnormal   Collection Time: 03/22/17  3:45 AM  Result Value Ref Range   Troponin I 0.12 (HH) <0.03 ng/mL    Comment: CRITICAL VALUE NOTED.  VALUE IS CONSISTENT WITH PREVIOUSLY REPORTED AND CALLED VALUE. Performed at [facility], [place] [address], [place], [place] [address]     Dg Chest 1 View  Result Date: 03/21/2017 CLINICAL DATA:  Fall EXAM: CHEST 1 VIEW COMPARISON:  None. FINDINGS: No acute pulmonary infiltrate or effusion. Mild cardiomegaly. Tortuous ectatic aorta with mild atherosclerosis. No pneumothorax. IMPRESSION: 1. Negative for acute infiltrate or edema 2. Cardiomegaly Electronically Signed   By: Donavan Foil M.D.   On: 03/21/2017 19:50   Dg Pelvis 1-2 Views  Result Date: 03/21/2017 CLINICAL DATA:  Fall with pain EXAM: PELVIS - 1-2 VIEW COMPARISON:  None. FINDINGS: Acute, displaced left femoral neck fracture with mild superior migration of the distal femur. Left femoral  head projects in joint. Pubic symphysis and rami are intact. Normal right hip alignment. Multiple large calcified masses within the pelvis, measuring up to 6.4 cm. IMPRESSION: 1. Acute displaced left femoral neck fracture 2. Multiple large calcified masses in the pelvis, possibly due to large calcified fibroids, CT could better localize the calcifications. Electronically Signed   By: Donavan Foil M.D.   On: 03/21/2017 19:47   Ct Head Wo Contrast  Result Date: 03/21/2017 CLINICAL DATA:  [place] 2 weeks ago, altered mental status. EXAM:  CT HEAD WITHOUT CONTRAST TECHNIQUE: Contiguous axial images were obtained from the base of the skull through the vertex without intravenous contrast. COMPARISON:  None. FINDINGS: BRAIN: Patchy hypodense RIGHT basal ganglia. Old LEFT basal ganglia and bilateral thalamus lacunar infarcts. Small area suspected RIGHT frontal lobe encephalomalacia. Patchy to confluent supratentorial white matter hypodensities. No advanced parenchymal brain volume loss for age. No hydrocephalus. No hemorrhage. No abnormal extra-axial fluid collections. VASCULAR: Mild calcific atherosclerosis of the carotid siphons. SKULL: No skull fracture. Anteriorly subluxed mandible condyles presumably from open mouth positioning. No significant scalp soft tissue swelling. SINUSES/ORBITS: The mastoid air-cells and included paranasal sinuses are well-aerated.The included ocular globes and orbital contents are non-suspicious. OTHER: 14 mm single bubble of gas LEFT infratemporal fossa. IMPRESSION: 1. Acute nonhemorrhagic RIGHT basal ganglia infarct. 2. Old bilateral basal ganglia and thalamus lacunar infarcts. Moderate to severe chronic small vessel ischemic disease. 3. Acute findings discussed with and reconfirmed by Dr.JON KNAPP on 03/21/2017 at 8:44 pm. Electronically Signed   By: Elon Alas M.D.   On: 03/21/2017 20:45   Dg Femur Min 2 Views Left  Result Date: 03/21/2017 CLINICAL DATA:  Fall with pain EXAM:  LEFT FEMUR 2 VIEWS COMPARISON:  None. FINDINGS: Acute displaced left femoral neck fracture with mild varus angulation. No femoral head dislocation. Partially visible calcified pelvic masses. Distal femur appears intact. Degenerative changes at the knee IMPRESSION: Acute displaced left femoral neck fracture Electronically Signed   By: Donavan Foil M.D.   On: 03/21/2017 19:47    Review of Systems  Unable to perform ROS: Patient nonverbal  Musculoskeletal: Positive for joint pain (Left hip).   Blood pressure (!) 169/111, pulse 85, temperature 97.7 F (36.5 C), temperature source Oral, resp. rate (!) 21, SpO2 96 %. Physical Exam  Constitutional: Alyssa Savage appears well-developed and well-nourished. No distress.  HENT:  Head: Normocephalic and atraumatic.  Eyes: Conjunctivae are normal. Right eye exhibits no discharge. Left eye exhibits no discharge. No scleral icterus.  Neck: Normal range of motion.  Cardiovascular: Normal rate and regular rhythm.  Respiratory: Effort normal. No respiratory distress.  Musculoskeletal:  RLE No traumatic wounds, ecchymosis, or rash  Nontender  No knee or ankle effusion  Knee stable to varus/ valgus and anterior/posterior stress  Sens DPN, SPN, TN intact  Motor EHL, ext, flex, evers 0/5  DP 1+, PT 0, No significant edema  LLE No traumatic wounds, ecchymosis, or rash  TTP hip  No knee or ankle effusion  Knee stable to varus/ valgus and anterior/posterior stress  Sens DPN, SPN, TN intact  Motor EHL, ext, flex, evers 5/5  DP 2+, PT 0, No significant edema  Neurological: Alyssa Savage is alert.  Skin: Skin is warm and dry. Alyssa Savage is not diaphoretic.  Psychiatric: Alyssa Savage has a normal mood and affect. Her behavior is normal.    Assessment/Plan: Left femoral neck fx -- Will need hemi vs THA by Dr. Lyla Glassing when cleared by neurology. Will make NPO after MN tonight just in case and reevaluate in the AM. NWB LLE. CVA -- per neurology, await surgical clearance    Lisette Abu, PA-C Orthopedic Surgery [phone] 03/22/2017, 9:04 AM

## 2017-03-22 NOTE — ED Notes (Signed)
Patient just returned to room from MRI.

## 2017-03-22 NOTE — ED Notes (Signed)
Patient transported to MRI 

## 2017-03-22 NOTE — Progress Notes (Signed)
PT Cancellation Note  Patient Details Name: Alyssa Savage MRN: [id number] DOB: 11/08/1930   Cancelled Treatment:    Reason Eval/Treat Not Completed: Patient not medically ready. Pt with noted L displaced femoral neck fracture. Pt to go for either L hemi or THA. Cancelling PT consult at this time. Please re-consult as appropriate s/p surgery. Thank you.  Kittie Plater, PT, DPT Pager #: [phone] Office #: [phone]    [facility] 03/22/2017, 9:14 AM

## 2017-03-22 NOTE — Consult Note (Signed)
Neurology Consultation Reason for Consult: Stroke on CT Referring Physician: Hillard Danker  CC: Slurred speech  History is obtained from: Patient  HPI: Bowen Michalowski is a 84 y.o. female who notes that she has had significant slurred speech since yesterday. Head CT was performed which shows right basal ganglia infarct. The patient states that she has been having problems predominantly with her left side and has a left femur fracture which I thinks the source of most for complaints. She does not notice any problems with her right side.  She was not aware of ever having a stroke before this most recent episode.  LKW: Unclear tpa given?: no, unclear time of onset    ROS: A 14 point ROS was performed and is negative except as noted in the HPI.   Past Medical History:  Diagnosis Date  . GERD (gastroesophageal reflux disease)      History reviewed. No pertinent family history.   Social History: Denies tobacco   Exam: Current vital signs: BP (!) 189/94   Pulse 61   Temp 97.7 F (36.5 C) (Oral)   Resp (!) 24   SpO2 97%  Vital signs in last 24 hours: Temp:  [97.7 F (36.5 C)-97.9 F (36.6 C)] 97.7 F (36.5 C) (02/18 2114) Pulse Rate:  [47-90] 61 (02/19 0000) Resp:  [13-24] 24 (02/19 0000) BP: (187-218)/(89-181) 189/94 (02/19 0000) SpO2:  [95 %-98 %] 97 % (02/19 0000)   Physical Exam  Constitutional: Appears well-developed and well-nourished.  Psych: Affect appropriate to situation Eyes: No scleral injection HENT: No OP obstrucion Head: Normocephalic.  Cardiovascular: Normal rate and regular rhythm.  Respiratory: Effort normal, non-labored breathing GI: Soft.  No distension. There is no tenderness.  Skin: WDI  Neuro: Mental Status: Patient is awake, alert, oriented to [place], place, month, year, and situation. She is dysarthric Cranial Nerves: II: Visual Fields are full. Pupils are equal, round, and reactive to light.   III,IV, VI: EOMI without ptosis or  diploplia.  V: Facial sensation is symmetric to temperature VII: Facial movement appears weak on the right VIII: hearing is intact to voice X: Uvula elevates symmetrically XI: Shoulder shrug is symmetric. XII: tongue is midline without atrophy or fasciculations.  Motor: Tone is normal. Bulk is normal. She has right arm >right leg weakness. 3/5 in the right arm, 4/5 in the right leg. She moves left arm well, left leg she is limited due to pain, but has good strength at ankle 0.0/dorsiflexion. Sensory: Sensation is symmetric to light touch and temperature in the arms and legs. Cerebellar: She is unable to perform. His finger with right arm, intact on the left   I have reviewed labs in epic and the results pertinent to this consultation are: Elevated creatinine, severe hypokalemia  I have reviewed the images obtained: CT head-hypoattenuation in the right suggestive of ischemic infarct  Impression: 84 year old female with RIGHT sided weakness and RIGHT sided changes on CT suggestive of infarct. I think that she needs an MRI to establish the full extent of her cerebrovascular disease. She does appear to have had an acute stroke, and will need further workup for this. She apparently will need surgery, but I would favor at least completing some degree of workup prior to consideration of timing.  Recommendations: 1. HgbA1c, fasting lipid panel 2. MRI, MRA  of the brain without contrast 3. Frequent neuro checks 4. Echocardiogram 5. Carotid dopplers 6. Prophylactic therapy-Antiplatelet med: Aspirin - dose 325mg  PO or 300mg  PR 7. Risk factor modification  8. Telemetry monitoring 9. PT consult, OT consult, Speech consult 10. please page stroke NP  Or  PA  Or MD  from 8am -4 pm as this patient will be followed by the stroke team at this point.   You can look them up on [email or web address]      Roland Rack, MD [facility] [phone]  If 7pm- 7am, please page neurology on call  as listed in [place].

## 2017-03-22 NOTE — Progress Notes (Signed)
NEUROHOSPITALISTS STROKE TEAM - DAILY PROGRESS NOTE   ADMISSION HISTORY:  Alyssa Savage is a 84 y.o. female who notes that she has had significant slurred speech since yesterday. Head CT was performed which shows right basal ganglia infarct. The patient states that she has been having problems predominantly with her left side and has a left femur fracture which I thinks the source of most for complaints. She does not notice any problems with her right side. She was not aware of ever having a stroke before this most recent episode.  LKW: Unclear tpa given?: no, unclear time of onset  SUBJECTIVE (INTERVAL HISTORY) No family is at the bedside. Patient is found laying in bed in NAD. Overall she feels her condition is unchanged. Voices no new complaints. No new events reported overnight. Patient states she broke her Left hip last Friday (one week ago) and did not seek medical attention until now. Has not had any medical exams for over 60 plus years. Takes no medications. Denies smoking or alcohol   OBJECTIVE Lab Results: CBC:  Recent Labs  Lab 03/21/17 1950  WBC 8.6  HGB 12.1  HCT 37.3  MCV 87.6  PLT 427*   BMP: Recent Labs  Lab 03/21/17 1950 03/22/17 0345  NA 147* 150*  K <2.0* 2.1*  CL 98* 101  CO2 30 26  GLUCOSE 123* 104*  BUN 25* 29*  CREATININE 1.71* 1.57*  CALCIUM 9.3 8.9  MG 1.9 2.2   Liver Function Tests:  Recent Labs  Lab 03/21/17 1950  AST 36  ALT 18  ALKPHOS 85  BILITOT 1.0  PROT 8.2*  ALBUMIN 3.4*   Cardiac Enzymes:  Recent Labs  Lab 03/22/17 0117 03/22/17 0345  TROPONINI 0.12* 0.12*   Coagulation Studies:  Recent Labs    03/21/17 1950  APTT 31  INR 0.99   Alcohol Level:  Recent Labs  Lab 03/21/17 1949  ETH <10   PHYSICAL EXAM Temp:  [97.7 F (36.5 C)-98.6 F (37 C)] 98.6 F (37 C) (02/19 1233) Pulse Rate:  [47-93] 88 (02/19 1233) Resp:  [13-24] 18 (02/19 1233) BP:  (162-226)/(89-181) 226/111 (02/19 1233) SpO2:  [92 %-99 %] 96 % (02/19 1153) Weight:  [41.1 kg (90 lb 9.7 oz)] 41.1 kg (90 lb 9.7 oz) (02/19 1233) General -   thin, frail, elderly African [place] woman, in no apparent distress HEENT-  Normocephalic,    Cardiovascular - Regular rate and rhythm  Respiratory - Lungs clear bilaterally. No wheezing. Abdomen - soft and non-tender, BS normal Extremities- no edema or cyanosis. Left hip movements limited due to pain Mental Status: Patient is awake, alert, oriented to [place], place, month, year, and situation. Her speech is very dysarthric and difficult to understand Cranial Nerves: II: Visual Fields are full. Pupils are equal, round, and reactive to light.   III,IV, VI: EOMI without ptosis or diploplia.  V: Facial sensation is symmetric to temperature VII: Facial movement appears weak on the right VIII: hearing is intact to voice X: Uvula elevates symmetrically XI: Shoulder shrug is symmetric. XII: tongue is midline without atrophy or fasciculations.  Motor: Tone is normal. Bulk is normal. She has right arm >right leg weakness. 3/5 in the right arm, 4/5 in the right leg. She moves left arm well, left leg she is limited due to pain and fracture, but has good strength except weakness of ankle dorsiflexion. Sensory: Sensation is symmetric to light touch and temperature in the arms and legs. Cerebellar: She is unable to  perform. His finger with right arm, intact on the left  IMAGING: I have personally reviewed the radiological images below and agree with the radiology interpretations.  Ct Head Wo Contrast Result Date: 03/21/2017 IMPRESSION: 1. Acute nonhemorrhagic RIGHT basal ganglia infarct. 2. Old bilateral basal ganglia and thalamus lacunar infarcts. Moderate to severe chronic small vessel ischemic disease. 3. Acute findings discussed with and reconfirmed by Dr.JON KNAPP on 03/21/2017 at 8:44 pm. Electronically Signed   By: Elon Alas  M.D.   On: 03/21/2017 20:45   Mr Brain Lottie Dawson Contrast Mr Jodene Nam Head Wo Contrast Result Date: 03/22/2017  IMPRESSION: 1.5-2 cm acute infarction in the posterior left basal ganglia, posterior limb internal capsule and radiating white matter tracts. No mass effect or hemorrhage. Widespread chronic small-vessel ischemic changes elsewhere throughout the brain as outlined above. No large vessel occlusion or correctable proximal stenosis. Widespread intracranial medium to small vessel atherosclerotic narrowing and irregularity. Electronically Signed   By: Nelson Chimes M.D.   On: 03/22/2017 11:49   Echocardiogram:                            RESULTS PENDING B/L Carotid U/S:                                                PENDING     IMPRESSION: Alyssa Savage is a 84 y.o. female with no PMH who presents with RIGHT sided weakness. MRI reveals:  1.5-2 cm Acute infarction in the posterior LEFT basal ganglia, posterior limb internal capsule and radiating white matter tracts.  Suspected Etiology: Likely small vessel disease Resultant Symptoms: Right sided deficits Stroke Risk Factors: diabetes mellitus, hyperlipidemia and hypertension Other Stroke Risk Factors: Advanced age,  Outstanding Stroke Work-up Studies:     Echocardiogram:                                                    PENDING B/L Carotid U/S:                                                     PENDING  03/22/2017 ASSESSMENT:   Neuro exam stable with Right sided deficits and dysarthria. MRI reveals acute Left BG CVA. ECHO and carotids pending. Patient in need of Left Hip fracture repair at some point. Surgery is high risk due to recent stroke. Will need to avoid Hypotension as to not extend the stroke. Lives alone, daughter lives in [place] and per notes is flying to [place]. Will need significant Rehab and home support.  PLAN  03/22/2017: Continue Aspirin/ Statin, for now Frequent neuro checks Telemetry monitoring PT/OT/SLP Consult PM &  Rehab Consult Case Management /MSW Ongoing aggressive stroke risk factor management Patient counseled to be compliant with her antithrombotic medications Patient counseled on Lifestyle modifications including, Diet, Exercise, and Stress Follow up with [facility] in 6 weeks  HX OF STROKES: Old bilateral basal ganglia and thalamus lacunar infarcts.  DYSPHAGIA: NPO until passes SLP swallow evaluation Aspiration Precautions in  progress  R/O AFIB: May need 30 day cardiac event monitor at discharge  HYPERTENSION: Stable, some elevated B/P's noted since admission Permissive hypertension (OK if <220/120) for 24-48 hours post stroke and then gradually normalized within 5-7 days. Labetolol PRN Long term BP goal normotensive. May slowly start B/P medications after 48 hours Home Meds: NONE  HYPERLIPIDEMIA:    Component Value Date/Time   CHOL 203 (H) 03/22/2017 0344   TRIG 90 03/22/2017 0344   HDL 68 03/22/2017 0344   CHOLHDL 3.0 03/22/2017 0344   VLDL 18 03/22/2017 0344   LDLCALC 117 (H) 03/22/2017 0344  Home Meds:  NONE LDL  goal < 70 Started on  Lipitor to 40 mg daily Continue statin at discharge  DIABETES: Lab Results  Component Value Date   HGBA1C 6.3 (H) 03/22/2017   No results for input(s): GLUCAP in the last 168 hours. HgbA1c goal < 7.0 Continue CBG monitoring and SSI to maintain glucose 140-180 mg/dl DM education   Other Active Problems: Principal Problem:   Stroke [facility]) Active Problems:   Fall   Fracture of femoral neck, left, closed ([place])   AKI (acute kidney injury) ([place])   Hypertensive emergency   Elevated troponin   Hypokalemia   GERD (gastroesophageal reflux disease)    Hospital day # 1 VTE prophylaxis: SCD's Diet : Diet NPO time specified Fall precautions Diet NPO time specified   FAMILY UPDATES: No family at bedside  TEAM UPDATES: Lavina Hamman, MD   Prior Home Stroke Medications:  No antithrombotic  Discharge Stroke Meds:    Please discharge patient on aspirin 325 mg daily   Disposition: Final discharge disposition not confirmed Therapy Recs:               PENDING Follow Up:  Follow-up Information    Garvin Fila, MD. Schedule an appointment as soon as possible for a visit in 6 week(s).   Specialties:  Neurology, Radiology Contact information: [address] [place] [address] [phone]          Patient, No Pcp Per -PCP Follow up in 1-2 weeks   Case Management aware of need   Assessment & plan discussed with with attending physician and they are in agreement.    Mary Sella, ANP-C Stroke Neurology Team 03/22/2017 2:42 PM I have personally examined this patient, reviewed notes, independently viewed imaging studies, participated in medical decision making and plan of care.ROS completed by me personally and pertinent positives fully documented  I have made any additions or clarifications directly to the above note. Agree with note above patient has not sought any medical care for last 60 years. She is presented with dysarthria and right hemiparesis due to left brain subcortical infarct from small vessel disease. She also fell and fractured her left hip about a week ago. This presents a tricky situation. She is not going to be able to do much of rehabilitation due to hip pain unless the fracture is treated. She runs a risk of worsening neurological deficits from her stroke during the surgery for hip fracture. Timing of surgery has to be carefully decided after discussion with orthopedic surgeon and patient and if she understands risk-benefit. Recommend avoid hypotension during and after surgery for possible and use local or spinal anesthesia: over Gen. Anesthesia. Greater than 50% time during this 35 minute visit was spent on counseling and coordination of care about her lacunar infarct, recent hip fracture and discussion about timing of surgery and answering questions  Antony Contras,  MD Medical Director [facility] Pager: [phone] 03/22/2017 3:58 PM  To contact Stroke Continuity provider, please refer to [email or web address] After hours, contact General Neurology

## 2017-03-23 ENCOUNTER — Inpatient Hospital Stay (HOSPITAL_COMMUNITY): Payer: Medicare Other

## 2017-03-23 ENCOUNTER — Encounter (HOSPITAL_COMMUNITY): Payer: Medicaid Other

## 2017-03-23 LAB — BASIC METABOLIC PANEL
ANION GAP: 14 (ref 5–15)
BUN: 25 mg/dL — ABNORMAL HIGH (ref 6–20)
CO2: 25 mmol/L (ref 22–32)
Calcium: 8.7 mg/dL — ABNORMAL LOW (ref 8.9–10.3)
Chloride: 107 mmol/L (ref 101–111)
Creatinine, Ser: 1.51 mg/dL — ABNORMAL HIGH (ref 0.44–1.00)
GFR calc Af Amer: 35 mL/min — ABNORMAL LOW (ref >60)
GFR calc non Af Amer: 31 mL/min — ABNORMAL LOW (ref >60)
GLUCOSE: 132 mg/dL — AB (ref 65–99)
POTASSIUM: 3 mmol/L — AB (ref 3.5–5.1)
Sodium: 146 mmol/L — ABNORMAL HIGH (ref 135–145)

## 2017-03-23 LAB — CBC
HEMATOCRIT: 32.9 % — AB (ref 36.0–46.0)
HEMOGLOBIN: 10.3 g/dL — AB (ref 12.0–15.0)
MCH: 27 pg (ref 26.0–34.0)
MCHC: 31.3 g/dL (ref 30.0–36.0)
MCV: 86.4 fL (ref 78.0–100.0)
Platelets: 327 10*3/uL (ref 150–400)
RBC: 3.81 MIL/uL — ABNORMAL LOW (ref 3.87–5.11)
RDW: 14.1 % (ref 11.5–15.5)
WBC: 8.6 10*3/uL (ref 4.0–10.5)

## 2017-03-23 LAB — MAGNESIUM: Magnesium: 1.8 mg/dL (ref 1.7–2.4)

## 2017-03-23 MED ORDER — POTASSIUM CHLORIDE 10 MEQ/100ML IV SOLN
10.0000 meq | INTRAVENOUS | Status: AC
  Administered 2017-03-23 (×2): 10 meq via INTRAVENOUS
  Filled 2017-03-23 (×2): qty 100

## 2017-03-23 NOTE — Progress Notes (Signed)
[facility] Progress Note  Patient: Alyssa Savage QQP:[id number]   PCP: Patient, No Pcp Per DOB: 1930-08-11   DOA: 03/21/2017   DOS: 03/23/2017   Date of Service: the patient was seen and examined on 03/23/2017  Subjective: Pain is well controlled.  No nausea no vomiting.  No acute events overnight.  Brief hospital course: Pt. with no known PMH ; admitted on 03/21/2017, presented with complaint of fall, was found to have acute stroke as well as left hip fracture. Currently further plan is repeat stroke workup as well as follow orthopedic recommendation.  Assessment and Plan: Stroke [facility]):   Head CT was performed which shows right basal ganglia stroke MRI brain shows 2 cm acute infarction in posterior left basal ganglia as well as posterior left internal capsule radiating to white matter tract. No mass or hemorrhage. Chronic small vessel disease, no significant large vessel disease on MRI. Blood pressure significantly elevated but will allow permissive HTN in the setting of acute stroke ASA and lipitor LDL 117, hemoglobin A1c 5.9. 2D transthoracic echocardiography 6065% EF, no acute abnormality. PT/OT consult when able to SLP recommends core track placement.  Consulted.  Fall and fracture of femoral neck, left, closed [facility]): Orthopedic surgeon, Dr.Swinteck was consulted. - Pain control: morphine prn and percocet - When necessary Zofran for nausea - Robaxin for muscle spasm -Biggest issue is medical clearance.  Discussed with neurology.  Their concern is worsening of the stroke intraoperatively as well as postoperatively due to hypotension as well as other physiological changes that can happen during the surgery. At present recommended discussed with family, if they are willing to understand the risk and still would like to go for the surgery patient can go for the surgery. Informed orthopedic doctors.  AKI (acute kidney injury) [facility]): cre 1.71 and BNU 25. Likely due to prerenal  secondary to dehydration - IVF: D5-1/2 NS at 75 cc/h  Hypertensive emergency:  -prn hydralazine for SBP>220  Elevated troponin: trop 0.10. No CP. Likely due to demand ischemia secondary to hypertension and acute stroke. - cycle CE q6 x3 and repeat EKG in the am  - prn Nitroglycerin, Morphine, and aspirin, lipitor  - Risk factor stratification: will check FLP, UDS and A1C  - 2d echo -inpt card consult was requested via Epic  Hypokalemia: K= 2.0  on admission. Continue monitoring.  GERD (gastroesophageal reflux disease) -protonix  Diet: N.p.o., on cor Trac with tube feeds. DVT Prophylaxis: subcutaneous Heparin Advance goals of care discussion: Full code  Family Communication: no family was present at bedside, at the time of interview.  Disposition:  Discharge to be determined.   Consultants: Neurology, orthopedics Procedures: Echocardiogram  Antibiotics: Anti-infectives (From admission, onward)   None       Objective: Physical Exam: Vitals:   03/22/17 2335 03/23/17 0341 03/23/17 0812 03/23/17 1228  BP: (!) 168/113 (!) 174/73 (!) 178/84 (!) 198/121  Pulse: 75 78 71 89  Resp: 18 18 18 18   Temp: 98.5 F (36.9 C) 99.3 F (37.4 C) 98.2 F (36.8 C) 97.9 F (36.6 C)  TempSrc: Oral Oral Axillary Oral  SpO2: 100% 100% 100% 99%  Weight:      Height:        Intake/Output Summary (Last 24 hours) at 03/23/2017 1518 Last data filed at 03/22/2017 2335 Gross per 24 hour  Intake -  Output 400 ml  Net -400 ml   Filed Weights   03/22/17 1233  Weight: 41.1 kg (90 lb 9.7 oz)   General:  Alert, Awake and Oriented to Time, Place and [place]. Appear in mild distress, affect appropriate Eyes: PERRL, Conjunctiva normal ENT: Oral Mucosa clear moist. Neck: no JVD, no Abnormal Mass Or lumps Cardiovascular: S1 and S2 Present, no Murmur, Peripheral Pulses Present Respiratory: normal respiratory effort, Bilateral Air entry equal and Decreased, no use of accessory muscle, Clear to  Auscultation, no Crackles, no wheezes Abdomen: Bowel Sound present, Soft and no tenderness, no hernia Skin: no redness, no Rash, no induration Extremities: no Pedal edema, no calf tenderness Neurologic: A aphasia as well as dysarthria.  Right-sided weakness.  Data Reviewed: CBC: Recent Labs  Lab 03/21/17 1950 03/23/17 0524  WBC 8.6 8.6  NEUTROABS 7.4  --   HGB 12.1 10.3*  HCT 37.3 32.9*  MCV 87.6 86.4  PLT 427* 831   Basic Metabolic Panel: Recent Labs  Lab 03/21/17 1950 03/22/17 0345 03/22/17 1610 03/23/17 0524  NA 147* 150* 148* 146*  K <2.0* 2.1* 2.4* 3.0*  CL 98* 101 104 107  CO2 30 26 27 25   GLUCOSE 123* 104* 112* 132*  BUN 25* 29* 25* 25*  CREATININE 1.71* 1.57* 1.55* 1.51*  CALCIUM 9.3 8.9 8.9 8.7*  MG 1.9 2.2  --  1.8    Liver Function Tests: Recent Labs  Lab 03/21/17 1950  AST 36  ALT 18  ALKPHOS 85  BILITOT 1.0  PROT 8.2*  ALBUMIN 3.4*   No results for input(s): LIPASE, AMYLASE in the last 168 hours. No results for input(s): AMMONIA in the last 168 hours. Coagulation Profile: Recent Labs  Lab 03/21/17 1950  INR 0.99   Cardiac Enzymes: Recent Labs  Lab 03/22/17 0117 03/22/17 0345 03/22/17 1418  TROPONINI 0.12* 0.12* 0.12*   BNP (last 3 results) No results for input(s): PROBNP in the last 8760 hours. CBG: No results for input(s): GLUCAP in the last 168 hours. Studies: Dg Swallowing Func-speech Pathology  Result Date: 03/23/2017 Objective Swallowing Evaluation: Type of Study: MBS-Modified Barium Swallow Study  Patient Details Name: Alyssa Savage MRN: [id number] Date of Birth: [birth year] Today's Date: 03/23/2017 Time: SLP Start Time (ACUTE ONLY): 1350 -SLP Stop Time (ACUTE ONLY): 1425 SLP Time Calculation (min) (ACUTE ONLY): 35 min Past Medical History: Past Medical History: Diagnosis Date . Femoral neck fracture ([place]) 03/2017  left  . GERD (gastroesophageal reflux disease)  . Hypertensive emergency 03/2017 . Hypokalemia 03/2017 . Stroke  [facility])  Past Surgical History: Past Surgical History: Procedure Laterality Date . NO PAST SURGERIES   HPI: 84 y.o.femalewho notes that she has had significant slurred speech since 03/21/17. Head CT was performed which shows right basal ganglia infarct; MRI shows Acute infarction in the posteriorLEFTbasal ganglia, posterior limb internal capsule and radiating white matter tracts.  Recent fall with left hip fracture.  Subjective: alert, cooperative Assessment / Plan / Recommendation CHL IP CLINICAL IMPRESSIONS 03/23/2017 Clinical Impression Pt presents with a moderate-severe oropharyngeal dysphagia marked by significant impairments in oral control/bolus containment and propulsion, poor labial seal; ineffective laryngeal vestibular closure, leading to eventual penetration and trace aspiration of most consistencies, either before the swallow or after the swallow when residue spills into airway; poor pharyngeal contraction with residuals throughout pharynx.  For now, recommend NPO with temporary enteral feeding (Dr. Posey Pronto has ordered cortrak); allow ice chips after oral care.  SLP will follow for therapeutic exercise and PO trials.  Pt agrees with plan.  SLP Visit Diagnosis Dysphagia, oropharyngeal phase (R13.12) Attention and concentration deficit following -- Frontal lobe and executive function deficit following -- Impact  on safety and function Severe aspiration risk   CHL IP TREATMENT RECOMMENDATION 03/23/2017 Treatment Recommendations Therapy as outlined in treatment plan below   No flowsheet data found. CHL IP DIET RECOMMENDATION 03/23/2017 SLP Diet Recommendations NPO;Alternative means - temporary Liquid Administration via -- Medication Administration Via alternative means Compensations -- Postural Changes --   CHL IP OTHER RECOMMENDATIONS 03/23/2017 Recommended Consults -- Oral Care Recommendations Oral care QID Other Recommendations --   CHL IP FOLLOW UP RECOMMENDATIONS 03/23/2017 Follow up Recommendations Inpatient  Rehab   CHL IP FREQUENCY AND DURATION 03/23/2017 Speech Therapy Frequency (ACUTE ONLY) min 3x week Treatment Duration 2 weeks      CHL IP ORAL PHASE 03/23/2017 Oral Phase Impaired Oral - Pudding Teaspoon -- Oral - Pudding Cup -- Oral - Honey Teaspoon Right anterior bolus loss;Weak lingual manipulation;Lingual pumping;Reduced posterior propulsion;Right pocketing in lateral sulci;Lingual/palatal residue;Piecemeal swallowing;Delayed oral transit;Decreased bolus cohesion;Premature spillage Oral - Honey Cup -- Oral - Nectar Teaspoon Right anterior bolus loss;Weak lingual manipulation;Lingual pumping;Reduced posterior propulsion;Right pocketing in lateral sulci;Lingual/palatal residue;Piecemeal swallowing;Delayed oral transit;Decreased bolus cohesion;Premature spillage Oral - Nectar Cup -- Oral - Nectar Straw -- Oral - Thin Teaspoon Right anterior bolus loss;Weak lingual manipulation;Lingual pumping;Reduced posterior propulsion;Right pocketing in lateral sulci;Lingual/palatal residue;Piecemeal swallowing;Delayed oral transit;Decreased bolus cohesion;Premature spillage Oral - Thin Cup -- Oral - Thin Straw -- Oral - Puree Right anterior bolus loss;Weak lingual manipulation;Lingual pumping;Reduced posterior propulsion;Right pocketing in lateral sulci;Lingual/palatal residue;Piecemeal swallowing;Delayed oral transit;Decreased bolus cohesion;Premature spillage Oral - Mech Soft -- Oral - Regular -- Oral - Multi-Consistency -- Oral - Pill -- Oral Phase - Comment --  CHL IP PHARYNGEAL PHASE 03/23/2017 Pharyngeal Phase Impaired Pharyngeal- Pudding Teaspoon -- Pharyngeal -- Pharyngeal- Pudding Cup -- Pharyngeal -- Pharyngeal- Honey Teaspoon Delayed swallow initiation-pyriform sinuses;Reduced pharyngeal peristalsis;Reduced epiglottic inversion;Reduced anterior laryngeal mobility;Reduced laryngeal elevation;Reduced airway/laryngeal closure;Reduced tongue base retraction;Penetration/Apiration after swallow;Trace aspiration;Pharyngeal  residue - valleculae;Pharyngeal residue - pyriform Pharyngeal Material enters airway, passes BELOW cords and not ejected out despite cough attempt by patient Pharyngeal- Honey Cup -- Pharyngeal -- Pharyngeal- Nectar Teaspoon Delayed swallow initiation-pyriform sinuses;Reduced pharyngeal peristalsis;Reduced epiglottic inversion;Reduced anterior laryngeal mobility;Reduced laryngeal elevation;Reduced airway/laryngeal closure;Reduced tongue base retraction;Penetration/Apiration after swallow;Trace aspiration;Pharyngeal residue - valleculae;Pharyngeal residue - pyriform Pharyngeal Material enters airway, passes BELOW cords and not ejected out despite cough attempt by patient Pharyngeal- Nectar Cup -- Pharyngeal -- Pharyngeal- Nectar Straw -- Pharyngeal -- Pharyngeal- Thin Teaspoon Delayed swallow initiation-pyriform sinuses;Reduced pharyngeal peristalsis;Reduced epiglottic inversion;Reduced anterior laryngeal mobility;Reduced laryngeal elevation;Reduced airway/laryngeal closure;Reduced tongue base retraction;Penetration/Apiration after swallow;Trace aspiration;Pharyngeal residue - valleculae;Pharyngeal residue - pyriform Pharyngeal Material enters airway, passes BELOW cords and not ejected out despite cough attempt by patient Pharyngeal- Thin Cup -- Pharyngeal -- Pharyngeal- Thin Straw -- Pharyngeal -- Pharyngeal- Puree Delayed swallow initiation-pyriform sinuses;Reduced pharyngeal peristalsis;Reduced epiglottic inversion;Reduced anterior laryngeal mobility;Reduced laryngeal elevation;Reduced tongue base retraction;Pharyngeal residue - valleculae;Pharyngeal residue - pyriform Pharyngeal Material enters airway, remains ABOVE vocal cords and not ejected out Pharyngeal- Mechanical Soft -- Pharyngeal -- Pharyngeal- Regular -- Pharyngeal -- Pharyngeal- Multi-consistency -- Pharyngeal -- Pharyngeal- Pill -- Pharyngeal -- Pharyngeal Comment --  No flowsheet data found. No flowsheet data found. Juan Quam Laurice 03/23/2017,  2:41 PM               Scheduled Meds: . aspirin  300 mg Rectal Daily   Or  . aspirin  325 mg Oral Daily  . atorvastatin  40 mg Oral q1800  . pantoprazole (PROTONIX) IV  40 mg Intravenous QHS   Continuous Infusions: . dextrose 5 % and  0.45 % NaCl with KCl 20 mEq/L 75 mL/hr at 03/22/17 1948   PRN Meds: acetaminophen **OR** acetaminophen (TYLENOL) oral liquid 160 mg/5 mL **OR** acetaminophen, hydrALAZINE, hydrOXYzine, methocarbamol, morphine injection, nitroGLYCERIN, oxyCODONE-acetaminophen, senna-docusate, zolpidem  Time spent: 35 minutes  Author: Berle Mull, MD [facility] Pager: [phone] 03/23/2017 3:18 PM  If 7PM-7AM, please contact night-coverage at [email or web address], password [facility]

## 2017-03-23 NOTE — Progress Notes (Signed)
Modified Barium Swallow Progress Note  Patient Details  Name: Alyssa Savage MRN: [id number] Date of Birth: 10/30/1930  Today's Date: 03/23/2017  Modified Barium Swallow completed.  Full report located under Chart Review in the Imaging Section.  Brief recommendations include the following:  Clinical Impression  Pt presents with a moderate-severe oropharyngeal dysphagia marked by significant impairments in oral control/bolus containment and propulsion, poor labial seal; ineffective laryngeal vestibular closure, leading to eventual penetration and trace aspiration of most consistencies, either before the swallow or after the swallow when residue spills into airway; poor pharyngeal contraction with residuals throughout pharynx.  For now, recommend NPO with temporary enteral feeding (Dr. Posey Pronto has ordered cortrak); allow ice chips after oral care.  SLP will follow for therapeutic exercise and PO trials.  Pt agrees with plan.    Swallow Evaluation Recommendations       SLP Diet Recommendations: NPO;Alternative means - temporary       Medication Administration: Via alternative means               Oral Care Recommendations: Oral care QID        Juan Quam Laurice 03/23/2017,2:40 PM

## 2017-03-23 NOTE — Progress Notes (Signed)
NEUROHOSPITALISTS STROKE TEAM - DAILY PROGRESS NOTE   ADMISSION HISTORY:  Alyssa Savage is a 84 y.o. female who notes that she has had significant slurred speech since yesterday. Head CT was performed which shows right basal ganglia infarct. The patient states that she has been having problems predominantly with her left side and has a left femur fracture which I thinks the source of most for complaints. She does not notice any problems with her right side. She was not aware of ever having a stroke before this most recent episode.  LKW: Unclear tpa given?: no, unclear time of onset  SUBJECTIVE (INTERVAL HISTORY)  family members are  at the bedside. Patient is found laying in bed in NAD. Overall she feels her condition is unchanged. Voices no new complaints. No new events reported overnight. Her daughter is alive in from [place] Lab Results: CBC:  Recent Labs  Lab 03/21/17 1950 03/23/17 0524  WBC 8.6 8.6  HGB 12.1 10.3*  HCT 37.3 32.9*  MCV 87.6 86.4  PLT 427* 327   BMP: Recent Labs  Lab 03/21/17 1950 03/22/17 0345 03/22/17 1610 03/23/17 0524  NA 147* 150* 148* 146*  K <2.0* 2.1* 2.4* 3.0*  CL 98* 101 104 107  CO2 30 26 27 25   GLUCOSE 123* 104* 112* 132*  BUN 25* 29* 25* 25*  CREATININE 1.71* 1.57* 1.55* 1.51*  CALCIUM 9.3 8.9 8.9 8.7*  MG 1.9 2.2  --  1.8   Liver Function Tests:  Recent Labs  Lab 03/21/17 1950  AST 36  ALT 18  ALKPHOS 85  BILITOT 1.0  PROT 8.2*  ALBUMIN 3.4*   Cardiac Enzymes:  Recent Labs  Lab 03/22/17 0117 03/22/17 0345 03/22/17 1418  TROPONINI 0.12* 0.12* 0.12*   Coagulation Studies:  Recent Labs    03/21/17 1950  APTT 31  INR 0.99   Alcohol Level:  Recent Labs  Lab 03/21/17 1949  ETH <10   PHYSICAL EXAM Temp:  [97.8 F (36.6 C)-99.3 F (37.4 C)] 97.9 F (36.6 C) (02/20 1228) Pulse Rate:  [71-96] 89 (02/20 1228) Resp:  [18-19] 18 (02/20  1228) BP: (168-226)/(73-122) 198/121 (02/20 1228) SpO2:  [98 %-100 %] 99 % (02/20 1228) Weight:  [90 lb 9.7 oz (41.1 kg)] 90 lb 9.7 oz (41.1 kg) (02/19 1233) General -   thin, frail, elderly African [place] woman, in no apparent distress HEENT-  Normocephalic,    Cardiovascular - Regular rate and rhythm  Respiratory - Lungs clear bilaterally. No wheezing. Abdomen - soft and non-tender, BS normal Extremities- no edema or cyanosis. Left hip movements limited due to pain Mental Status: Patient is awake, alert, oriented to [place], place, month, year, and situation. Her speech is very dysarthric and difficult to understand Cranial Nerves: II: Visual Fields are full. Pupils are equal, round, and reactive to light.   III,IV, VI: EOMI without ptosis or diploplia.  V: Facial sensation is symmetric to temperature VII: Facial movement appears weak on the right VIII: hearing is intact to voice X: Uvula elevates symmetrically XI: Shoulder shrug is symmetric. XII: tongue is midline without atrophy or fasciculations.  Motor: Tone is normal. Bulk is normal. She has right arm >right leg weakness. 3/5 in the right arm, 4/5 in the right leg. She moves left arm well, left leg she is limited due to pain and fracture, but has good strength except weakness of ankle dorsiflexion. Sensory: Sensation is symmetric to light touch and temperature in the arms and  legs. Cerebellar: She is unable to perform. His finger with right arm, intact on the left  IMAGING: I have personally reviewed the radiological images below and agree with the radiology interpretations.  Ct Head Wo Contrast Result Date: 03/21/2017 IMPRESSION: 1. Acute nonhemorrhagic RIGHT basal ganglia infarct. 2. Old bilateral basal ganglia and thalamus lacunar infarcts. Moderate to severe chronic small vessel ischemic disease. 3. Acute findings discussed with and reconfirmed by Dr.JON KNAPP on 03/21/2017 at 8:44 pm. Electronically Signed   By: Elon Alas M.D.   On: 03/21/2017 20:45   Mr Brain Lottie Dawson Contrast Mr Jodene Nam Head Wo Contrast Result Date: 03/22/2017  IMPRESSION: 1.5-2 cm acute infarction in the posterior left basal ganglia, posterior limb internal capsule and radiating white matter tracts. No mass effect or hemorrhage. Widespread chronic small-vessel ischemic changes elsewhere throughout the brain as outlined above. No large vessel occlusion or correctable proximal stenosis. Widespread intracranial medium to small vessel atherosclerotic narrowing and irregularity. Electronically Signed   By: Nelson Chimes M.D.   On: 03/22/2017 11:49   Echocardiogram:                       Left ventricle: The cavity size was normal. There was moderate   concentric hypertrophy. Systolic function was normal. The   estimated ejection fraction was in the range of 60% to 65%. Wall   motion was normal; there were no regional wall motion   abnormalities  B/L Carotid U/S:                                                PENDING     IMPRESSION: Alyssa Savage is a 84 y.o. female with no PMH who presents with RIGHT sided weakness. MRI reveals:  1.5-2 cm Acute infarction in the posterior LEFT basal ganglia, posterior limb internal capsule and radiating white matter tracts.  Suspected Etiology: Likely small vessel disease Resultant Symptoms: Right sided deficits Stroke Risk Factors: diabetes mellitus, hyperlipidemia and hypertension Other Stroke Risk Factors: Advanced age,  Outstanding Stroke Work-up Studies:     Echocardiogram:                                                    PENDING B/L Carotid U/S:                                                     PENDING  03/23/2017 ASSESSMENT:   Neuro exam stable with Right sided deficits and dysarthria. MRI reveals acute Left BG CVA. ECHO and carotids pending. Patient in need of Left Hip fracture repair at some point. Surgery is high risk due to recent stroke. Will need to avoid Hypotension as to not extend  the stroke. Lives alone, daughter lives in [place] and per notes is flying to [place]. Will need significant Rehab and home support.  PLAN  03/23/2017: Continue Aspirin/ Statin, for now Frequent neuro checks Telemetry monitoring PT/OT/SLP Consult PM & Rehab Consult Case Management /MSW Ongoing aggressive stroke risk factor management Patient counseled to be compliant  with her antithrombotic medications Patient counseled on Lifestyle modifications including, Diet, Exercise, and Stress Follow up with [facility] in 6 weeks  HX OF STROKES: Old bilateral basal ganglia and thalamus lacunar infarcts.  DYSPHAGIA: NPO until passes SLP swallow evaluation Aspiration Precautions in progress  R/O AFIB: May need 30 day cardiac event monitor at discharge  HYPERTENSION: Stable, some elevated B/P's noted since admission Permissive hypertension (OK if <220/120) for 24-48 hours post stroke and then gradually normalized within 5-7 days. Labetolol PRN Long term BP goal normotensive. May slowly start B/P medications after 48 hours Home Meds: NONE  HYPERLIPIDEMIA:    Component Value Date/Time   CHOL 203 (H) 03/22/2017 0344   TRIG 90 03/22/2017 0344   HDL 68 03/22/2017 0344   CHOLHDL 3.0 03/22/2017 0344   VLDL 18 03/22/2017 0344   LDLCALC 117 (H) 03/22/2017 0344  Home Meds:  NONE LDL  goal < 70 Started on  Lipitor to 40 mg daily Continue statin at discharge  DIABETES: Lab Results  Component Value Date   HGBA1C 6.3 (H) 03/22/2017   No results for input(s): GLUCAP in the last 168 hours. HgbA1c goal < 7.0 Continue CBG monitoring and SSI to maintain glucose 140-180 mg/dl DM education   Other Active Problems: Principal Problem:   Stroke [facility]) Active Problems:   Fall   Fracture of femoral neck, left, closed ([place])   AKI (acute kidney injury) ([place])   Hypertensive emergency   Elevated troponin   Hypokalemia   GERD (gastroesophageal reflux disease)    Hospital day #  2 VTE prophylaxis: SCD's Diet : Diet NPO time specified Fall precautions   FAMILY UPDATES: No family at bedside  TEAM UPDATES: Lavina Hamman, MD   Prior Home Stroke Medications:  No antithrombotic  Discharge Stroke Meds:  Please discharge patient on aspirin 325 mg daily   Disposition: Final discharge disposition not confirmed Therapy Recs:               PENDING Follow Up:  Follow-up Information    Garvin Fila, MD. Schedule an appointment as soon as possible for a visit in 6 week(s).   Specialties:  Neurology, Radiology Contact information: [address] [place] [address] [phone]          Patient, No Pcp Per -PCP Follow up in 1-2 weeks   Case Management aware of need     I have personally examined this patient, reviewed notes, independently viewed imaging studies, participated in medical decision making and plan of care.ROS completed by me personally and pertinent positives fully documented  I have made any additions or clarifications directly to the above note.  She is presented with dysarthria and right hemiparesis due to left brain subcortical infarct from small vessel disease. She also fell and fractured her left hip about a week ago. This presents a tricky situation. She is not going to be able to do much of rehabilitation due to hip pain unless the fracture is treated. She runs a risk of worsening neurological deficits from her stroke during the surgery for hip fracture. Timing of surgery has to be carefully decided after discussion with orthopedic surgeon and patient and if she understands risk-benefit. Recommend avoid hypotension during and after surgery for possible and use local or spinal anesthesia: over Gen. Anesthesia. Discuss with Dr. Posey Pronto Greater than 50% time during this 25 minute visit was spent on counseling and coordination of care about her lacunar infarct, recent hip fracture and  discussion about timing of surgery and answering  questions  Antony Contras, MD Medical Director [place] Pager: [phone] 03/23/2017 12:30 PM  To contact Stroke Continuity provider, please refer to [email or web address] After hours, contact General Neurology

## 2017-03-23 NOTE — Evaluation (Signed)
Clinical/Bedside Swallow Evaluation Patient Details  Name: Alyssa Savage MRN: [id number] Date of Birth: 09/29/1930  Today's Date: 03/23/2017 Time: SLP Start Time (ACUTE ONLY): 1215 SLP Stop Time (ACUTE ONLY): 1240 SLP Time Calculation (min) (ACUTE ONLY): 25 min  Past Medical History:  Past Medical History:  Diagnosis Date  . Femoral neck fracture ([place]) 03/2017   left   . GERD (gastroesophageal reflux disease)   . Hypertensive emergency 03/2017  . Hypokalemia 03/2017  . Stroke [facility])    Past Surgical History:  Past Surgical History:  Procedure Laterality Date  . NO PAST SURGERIES     HPI:  84 y.o.femalewho notes that she has had significant slurred speech since 03/21/17. Head CT was performed which shows right basal ganglia infarct; MRI shows Acute infarction in the posteriorLEFTbasal ganglia, posterior limb internal capsule and radiating white matter tracts.  Recent fall with left hip fracture.    Assessment / Plan / Recommendation Clinical Impression  Pt presents with multiple s/s of dysphagia with high aspiration risk marked by significant left CN involvement V, VII, X, XII; right sensory/motor impairment with oral loss of bolus, decreased secretion management, and explosive coughing after limited trials of ice chips and thin liquids.  Recommend proceeding with MBS today.  Discussed with pt, sister, and daughter via phone who is in transif to [place] from [place].  There's a likelihood pt will need temporary enteral feed - discussed this possibility with pt/dtr, who are in agreement.   SLP Visit Diagnosis: Dysphagia, unspecified (R13.10)    Aspiration Risk  Severe aspiration risk    Diet Recommendation    NPO pending MBS today      Other  Recommendations     Follow up Recommendations        Frequency and Duration            Prognosis        Swallow Study   General Date of Onset: 03/21/17 HPI: 84 y.o.femalewho notes that she has had significant slurred speech since  03/21/17. Head CT was performed which shows right basal ganglia infarct; MRI shows Acute infarction in the posteriorLEFTbasal ganglia, posterior limb internal capsule and radiating white matter tracts.  Recent fall with left hip fracture.  Type of Study: Bedside Swallow Evaluation Diet Prior to this Study: NPO Temperature Spikes Noted: No Respiratory Status: Room air History of Recent Intubation: No Behavior/Cognition: Alert;Cooperative Oral Cavity Assessment: Within Functional Limits Oral Care Completed by SLP: Recent completion by staff Oral Cavity - Dentition: Edentulous Vision: Functional for self-feeding Self-Feeding Abilities: Able to feed self Patient Positioning: Upright in bed Baseline Vocal Quality: Normal Volitional Cough: Strong Volitional Swallow: Able to elicit    Oral/Motor/Sensory Function Overall Oral Motor/Sensory Function: Moderate impairment Facial Symmetry: Abnormal symmetry right;Suspected CN VII (facial) dysfunction Facial Strength: Reduced right;Suspected CN VII (facial) dysfunction Facial Sensation: Reduced right;Suspected CN V (Trigeminal) dysfunction Lingual ROM: Reduced right;Suspected CN XII (hypoglossal) dysfunction Lingual Symmetry: Abnormal symmetry right;Suspected CN XII (hypoglossal) dysfunction Lingual Strength: Reduced;Suspected CN XII (hypoglossal) dysfunction   Ice Chips Ice chips: Impaired Pharyngeal Phase Impairments: Cough - Immediate   Thin Liquid Thin Liquid: Impaired Presentation: Spoon Oral Phase Functional Implications: Right anterior spillage Pharyngeal  Phase Impairments: Cough - Immediate    Nectar Thick Nectar Thick Liquid: Not tested   Honey Thick Honey Thick Liquid: Not tested   Puree Puree: Not tested   Solid   GO   Solid: Not tested        Juan Quam Laurice 03/23/2017,1:24 PM

## 2017-03-23 NOTE — Progress Notes (Signed)
Progress Note  Patient Name: Alyssa Savage Date of Encounter: 03/23/2017  Primary Cardiologist: Minus Breeding, MD   Subjective   Pt has no complaints. Resting in bed with right facial droop. Sister at bedside.  Inpatient Medications    Scheduled Meds: . aspirin  300 mg Rectal Daily   Or  . aspirin  325 mg Oral Daily  . atorvastatin  40 mg Oral q1800  . pantoprazole (PROTONIX) IV  40 mg Intravenous QHS   Continuous Infusions: . dextrose 5 % and 0.45 % NaCl with KCl 20 mEq/L 75 mL/hr at 03/22/17 1948  . potassium chloride 10 mEq (03/23/17 0845)   PRN Meds: acetaminophen **OR** acetaminophen (TYLENOL) oral liquid 160 mg/5 mL **OR** acetaminophen, hydrALAZINE, hydrOXYzine, methocarbamol, morphine injection, nitroGLYCERIN, oxyCODONE-acetaminophen, senna-docusate, zolpidem   Vital Signs    Vitals:   03/22/17 2027 03/22/17 2335 03/23/17 0341 03/23/17 0812  BP: (!) 203/93 (!) 168/113 (!) 174/73 (!) 178/84  Pulse: 85 75 78 71  Resp: 18 18 18 18   Temp: 98.7 F (37.1 C) 98.5 F (36.9 C) 99.3 F (37.4 C) 98.2 F (36.8 C)  TempSrc: Oral Oral Oral Axillary  SpO2: 100% 100% 100% 100%  Weight:      Height:        Intake/Output Summary (Last 24 hours) at 03/23/2017 0912 Last data filed at 03/22/2017 2335 Gross per 24 hour  Intake -  Output 400 ml  Net -400 ml   Filed Weights   03/22/17 1233  Weight: 90 lb 9.7 oz (41.1 kg)    Telemetry    Sinus with premature complexes - Personally Reviewed  ECG     no new tracings - Personally Reviewed  Physical Exam   GEN: No acute distress.   Neck: No JVD Cardiac: RRR, no murmurs, rubs, or gallops.  Respiratory: Clear to auscultation bilaterally. GI: Soft, nontender, non-distended  MS: no edema Neuro:  Nonfocal  Psych: Normal affect   Labs    Chemistry Recent Labs  Lab 03/21/17 1950 03/22/17 0345 03/22/17 1610 03/23/17 0524  NA 147* 150* 148* 146*  K <2.0* 2.1* 2.4* 3.0*  CL 98* 101 104 107  CO2 30 26 27 25    GLUCOSE 123* 104* 112* 132*  BUN 25* 29* 25* 25*  CREATININE 1.71* 1.57* 1.55* 1.51*  CALCIUM 9.3 8.9 8.9 8.7*  PROT 8.2*  --   --   --   ALBUMIN 3.4*  --   --   --   AST 36  --   --   --   ALT 18  --   --   --   ALKPHOS 85  --   --   --   BILITOT 1.0  --   --   --   GFRNONAA 26* 29* 30* 31*  GFRAA 30* 34* 34* 35*  ANIONGAP 19* 23* 17* 14     Hematology Recent Labs  Lab 03/21/17 1950 03/23/17 0524  WBC 8.6 8.6  RBC 4.26 3.81*  HGB 12.1 10.3*  HCT 37.3 32.9*  MCV 87.6 86.4  MCH 28.4 27.0  MCHC 32.4 31.3  RDW 14.0 14.1  PLT 427* 327    Cardiac Enzymes Recent Labs  Lab 03/22/17 0117 03/22/17 0345 03/22/17 1418  TROPONINI 0.12* 0.12* 0.12*    Recent Labs  Lab 03/21/17 1954  TROPIPOC 0.10*     BNPNo results for input(s): BNP, PROBNP in the last 168 hours.   DDimer No results for input(s): DDIMER in the last 168 hours.  Radiology    Dg Chest 1 View  Result Date: 03/21/2017 CLINICAL DATA:  Fall EXAM: CHEST 1 VIEW COMPARISON:  None. FINDINGS: No acute pulmonary infiltrate or effusion. Mild cardiomegaly. Tortuous ectatic aorta with mild atherosclerosis. No pneumothorax. IMPRESSION: 1. Negative for acute infiltrate or edema 2. Cardiomegaly Electronically Signed   By: Donavan Foil M.D.   On: 03/21/2017 19:50   Dg Pelvis 1-2 Views  Result Date: 03/21/2017 CLINICAL DATA:  Fall with pain EXAM: PELVIS - 1-2 VIEW COMPARISON:  None. FINDINGS: Acute, displaced left femoral neck fracture with mild superior migration of the distal femur. Left femoral head projects in joint. Pubic symphysis and rami are intact. Normal right hip alignment. Multiple large calcified masses within the pelvis, measuring up to 6.4 cm. IMPRESSION: 1. Acute displaced left femoral neck fracture 2. Multiple large calcified masses in the pelvis, possibly due to large calcified fibroids, CT could better localize the calcifications. Electronically Signed   By: Donavan Foil M.D.   On: 03/21/2017 19:47    Ct Head Wo Contrast  Result Date: 03/21/2017 CLINICAL DATA:  [place] 2 weeks ago, altered mental status. EXAM: CT HEAD WITHOUT CONTRAST TECHNIQUE: Contiguous axial images were obtained from the base of the skull through the vertex without intravenous contrast. COMPARISON:  None. FINDINGS: BRAIN: Patchy hypodense RIGHT basal ganglia. Old LEFT basal ganglia and bilateral thalamus lacunar infarcts. Small area suspected RIGHT frontal lobe encephalomalacia. Patchy to confluent supratentorial white matter hypodensities. No advanced parenchymal brain volume loss for age. No hydrocephalus. No hemorrhage. No abnormal extra-axial fluid collections. VASCULAR: Mild calcific atherosclerosis of the carotid siphons. SKULL: No skull fracture. Anteriorly subluxed mandible condyles presumably from open mouth positioning. No significant scalp soft tissue swelling. SINUSES/ORBITS: The mastoid air-cells and included paranasal sinuses are well-aerated.The included ocular globes and orbital contents are non-suspicious. OTHER: 14 mm single bubble of gas LEFT infratemporal fossa. IMPRESSION: 1. Acute nonhemorrhagic RIGHT basal ganglia infarct. 2. Old bilateral basal ganglia and thalamus lacunar infarcts. Moderate to severe chronic small vessel ischemic disease. 3. Acute findings discussed with and reconfirmed by Dr.JON KNAPP on 03/21/2017 at 8:44 pm. Electronically Signed   By: Elon Alas M.D.   On: 03/21/2017 20:45   Mr Brain Wo Contrast  Result Date: 03/22/2017 CLINICAL DATA:  [place] 2 weeks ago. Gait disturbance. Slurred speech. Left facial droop. EXAM: MRI HEAD WITHOUT CONTRAST MRA HEAD WITHOUT CONTRAST TECHNIQUE: Multiplanar, multiecho pulse sequences of the brain and surrounding structures were obtained without intravenous contrast. Angiographic images of the head were obtained using MRA technique without contrast. COMPARISON:  CT 03/21/2017 FINDINGS: MRI HEAD FINDINGS Brain: 1.5 x 2 cm region of acute infarction in the  posterior basal ganglia/posterior limb internal capsule/radiating white matter tracts on the left. No mass effect or hemorrhage. No other acute infarction. Elsewhere, there are extensive chronic small vessel ischemic changes throughout the pons. No focal cerebellar insult. Chronic small-vessel ischemic changes are present throughout the thalami, basal ganglia and hemispheric white matter. Scattered foci of hemosiderin deposition are present scattered throughout the brain related to the old small vessel insults. No evidence of large vessel territory infarction or mass lesion. No sign of acute hemorrhage. No hydrocephalus or extra-axial collection. Vascular: Major vessels at the base of the brain show flow. Skull and upper cervical spine: Negative Sinuses/Orbits: Clear/normal Other: None MRA HEAD FINDINGS Both internal carotid arteries are patent through the skull base and siphon regions. The anterior and middle cerebral vessels are patent without proximal stenosis. More distal branch vessels show  atherosclerotic irregularity and narrowing, particularly evident in the anterior cerebral arteries. Both vertebral arteries are patent to the basilar. No basilar stenosis. Posterior circulation branch vessels are patent. Pronounced atherosclerotic irregularity of the PCA branches. IMPRESSION: 1.5-2 cm acute infarction in the posterior left basal ganglia, posterior limb internal capsule and radiating white matter tracts. No mass effect or hemorrhage. Widespread chronic small-vessel ischemic changes elsewhere throughout the brain as outlined above. No large vessel occlusion or correctable proximal stenosis. Widespread intracranial medium to small vessel atherosclerotic narrowing and irregularity. Electronically Signed   By: Nelson Chimes M.D.   On: 03/22/2017 11:49   Mr Jodene Nam Head Wo Contrast  Result Date: 03/22/2017 CLINICAL DATA:  [place] 2 weeks ago. Gait disturbance. Slurred speech. Left facial droop. EXAM: MRI HEAD WITHOUT  CONTRAST MRA HEAD WITHOUT CONTRAST TECHNIQUE: Multiplanar, multiecho pulse sequences of the brain and surrounding structures were obtained without intravenous contrast. Angiographic images of the head were obtained using MRA technique without contrast. COMPARISON:  CT 03/21/2017 FINDINGS: MRI HEAD FINDINGS Brain: 1.5 x 2 cm region of acute infarction in the posterior basal ganglia/posterior limb internal capsule/radiating white matter tracts on the left. No mass effect or hemorrhage. No other acute infarction. Elsewhere, there are extensive chronic small vessel ischemic changes throughout the pons. No focal cerebellar insult. Chronic small-vessel ischemic changes are present throughout the thalami, basal ganglia and hemispheric white matter. Scattered foci of hemosiderin deposition are present scattered throughout the brain related to the old small vessel insults. No evidence of large vessel territory infarction or mass lesion. No sign of acute hemorrhage. No hydrocephalus or extra-axial collection. Vascular: Major vessels at the base of the brain show flow. Skull and upper cervical spine: Negative Sinuses/Orbits: Clear/normal Other: None MRA HEAD FINDINGS Both internal carotid arteries are patent through the skull base and siphon regions. The anterior and middle cerebral vessels are patent without proximal stenosis. More distal branch vessels show atherosclerotic irregularity and narrowing, particularly evident in the anterior cerebral arteries. Both vertebral arteries are patent to the basilar. No basilar stenosis. Posterior circulation branch vessels are patent. Pronounced atherosclerotic irregularity of the PCA branches. IMPRESSION: 1.5-2 cm acute infarction in the posterior left basal ganglia, posterior limb internal capsule and radiating white matter tracts. No mass effect or hemorrhage. Widespread chronic small-vessel ischemic changes elsewhere throughout the brain as outlined above. No large vessel occlusion  or correctable proximal stenosis. Widespread intracranial medium to small vessel atherosclerotic narrowing and irregularity. Electronically Signed   By: Nelson Chimes M.D.   On: 03/22/2017 11:49   Dg Femur Min 2 Views Left  Result Date: 03/21/2017 CLINICAL DATA:  Fall with pain EXAM: LEFT FEMUR 2 VIEWS COMPARISON:  None. FINDINGS: Acute displaced left femoral neck fracture with mild varus angulation. No femoral head dislocation. Partially visible calcified pelvic masses. Distal femur appears intact. Degenerative changes at the knee IMPRESSION: Acute displaced left femoral neck fracture Electronically Signed   By: Donavan Foil M.D.   On: 03/21/2017 19:47    Cardiac Studies   Echo 03/22/17: Study Conclusions - Left ventricle: The cavity size was normal. There was moderate concentric hypertrophy. Systolic function was normal. The estimated ejection fraction was in the range of 60% to 65%. Wall motion was normal; there were no regional wall motion   abnormalities. Doppler parameters are consistent with abnormal   left ventricular relaxation (grade 1 diastolic dysfunction). - Aortic valve: Transvalvular velocity was within the normal range.   There was no stenosis. There was no regurgitation. - Mitral  valve: Transvalvular velocity was within the normal range.   There was no evidence for stenosis. There was no regurgitation. - Right ventricle: The cavity size was normal. Wall thickness was   normal. Systolic function was normal. - Tricuspid valve: There was no regurgitation.  Patient Profile     84 y.o. female with a hx of no significant past medical history injured for evaluation of slurred speech, fall and left hip pain who is being seen today for the evaluation of preoperative clearance.  No prior cardiac history. Patient has not seen a doctor in approximately 60 years.  Assessment & Plan    1. Preoperative clearance Echocardiogram yesterday with normal EF, no wall motion abnormality,  and grade 1 DD. She is euvolemic on exam. Troponins have been mildly elevated but flat without EKG changes suggestive of ischemia. She denies chest pain. This likely represents demand ischemia vs immobility after a fall. She is at acceptable risk for surgery from a cardiology standpoint - will review chart with attending.  2. Hypokalemia K is 3.0 following replacement yesterday. Additional potassium ordered today.  3. HTN Pressures remain elevated. Will defer to neurology for permissive pressure and primary.   4. CVA Per neurology.    For questions or updates, please contact [place] Please consult [email or web address] for contact info under Cardiology/STEMI.      Signed, Tami Lin Duke, PA  03/23/2017, 9:12 AM     History and all data above reviewed.  Patient examined.  No chest pain.  No SOB. I agree with the findings as above.  The patient exam reveals COR:RRR  ,  Lungs: Clear  ,  Abd: Positive bowel sounds, no rebound no guarding, Ext No edema  .  All available labs, radiology testing, previous records reviewed. Agree with documented assessment and plan. Preop:  Echo OK.  No contraindication to surgery from our standpoint.  However, neurology is commenting on appropriate timing.  We will follow as needed.    Jeneen Rinks Alixandra Alfieri  12:08 PM  03/23/2017

## 2017-03-23 NOTE — Care Management Note (Signed)
Case Management Note  Patient Details  Name: Alyssa Savage MRN: [id number] Date of Birth: 10/21/1930  Subjective/Objective:     Pt admitted with CVA. She is from home alone. Pts daughter from out of town and flying in [place]. Pts sister is at the bedside.     PCP: none Insurance: has none listed as primary  Has medicaid listed as secondary            Action/Plan: CM met with the patient and her sister. Pt states she has Medicare and Medicaid. CM spoke to pts nephew over the phone and he is going to bring the Medicare card tomorrow. CM asked about a PCP and pt would rather make those decisions with her daughter present. CM following.  Expected Discharge Date:                  Expected Discharge Plan:     In-House Referral:     Discharge planning Services     Post Acute Care Choice:    Choice offered to:     DME Arranged:    DME Agency:     HH Arranged:    HH Agency:     Status of Service:  In process, will continue to follow  If discussed at Long Length of Stay Meetings, dates discussed:    Additional Comments:  [name], RN 03/23/2017, 3:53 PM

## 2017-03-23 NOTE — Progress Notes (Signed)
Cortrak Tube Team Note:  Consult received to place a Cortrak feeding tube.   A 10 F Cortrak tube was placed in the R nare and secured with a nasal bridle at 64 cm. Per the Cortrak monitor reading the tube tip is gastric.   Abdominal x-ray has been ordered by the Cortrak team due to tracing. Please confirm tube placement before using the Cortrak tube.   If the tube becomes dislodged please keep the tube and contact the Cortrak team at [email or web address] (password [email or web address]) for replacement.  If after hours and replacement cannot be delayed, place a NG tube and confirm placement with an abdominal x-ray.    Gaynell Face, MS, RD, LDN Pager: [phone] Weekend/After Hours: [phone]

## 2017-03-23 NOTE — Progress Notes (Signed)
OT Cancellation Note  Patient Details Name: Alyssa Savage MRN: [id number] DOB: [birth year]   Cancelled Treatment:    Reason Eval/Treat Not Completed: Patient at procedure or test/ unavailable; currently getting cortrak, will follow up as schedule permits.  Lou Cal, OT Pager [phone] 03/23/2017   Raymondo Band 03/23/2017, 4:20 PM

## 2017-03-23 NOTE — Evaluation (Signed)
Speech Language Pathology Evaluation Patient Details Name: Alyssa Savage MRN: [id number] DOB: Aug 03, 1930 Today's Date: 03/23/2017 Time: 1240-1300 SLP Time Calculation (min) (ACUTE ONLY): 20 min  Problem List:  Patient Active Problem List   Diagnosis Date Noted  . Fall 03/21/2017  . Fracture of femoral neck, left, closed ([place]) 03/21/2017  . AKI (acute kidney injury) ([place]) 03/21/2017  . Hypertensive emergency 03/21/2017  . Elevated troponin 03/21/2017  . Hypokalemia 03/21/2017  . Stroke ([place]) 03/21/2017  . GERD (gastroesophageal reflux disease)   . Closed fracture of left hip ([place])   . Prolonged Q-T interval on ECG    Past Medical History:  Past Medical History:  Diagnosis Date  . Femoral neck fracture ([place]) 03/2017   left   . GERD (gastroesophageal reflux disease)   . Hypertensive emergency 03/2017  . Hypokalemia 03/2017  . Stroke [facility])    Past Surgical History:  Past Surgical History:  Procedure Laterality Date  . NO PAST SURGERIES     HPI:  84 y.o.femalewho notes that she has had significant slurred speech since 03/21/17. Head CT was performed which shows right basal ganglia infarct; MRI shows Acute infarction in the posteriorLEFTbasal ganglia, posterior limb internal capsule and radiating white matter tracts.  Recent fall with left hip fracture.    Assessment / Plan / Recommendation Clinical Impression  Pt presents with a moderate-severe dysarthria with adequate expressive/receptive language and functional cognition.  She presents with consonant distortion and omissions, impacting intelligibility.  Pt is a Scientist, research (physical sciences) - very stimulable for self-corrections and monitoring of speech pace and articulation.  Depending on available support, pt would be an excellent candidate for CIR given her motivation and strong work Psychologist, forensic.      SLP Assessment  SLP Recommendation/Assessment: Patient needs continued Speech Lanaguage Pathology Services SLP Visit Diagnosis: Dysarthria  and anarthria (R47.1)    Follow Up Recommendations  Inpatient Rehab    Frequency and Duration min 2x/week  2 weeks      SLP Evaluation Cognition  Overall Cognitive Status: Within Functional Limits for tasks assessed Arousal/Alertness: Awake/alert Orientation Level: Oriented to [place];Oriented to situation Attention: Sustained Sustained Attention: Appears intact       Comprehension  Auditory Comprehension Overall Auditory Comprehension: Appears within functional limits for tasks assessed Visual Recognition/Discrimination Discrimination: Within Function Limits    Expression Expression Primary Mode of Expression: Verbal Verbal Expression Overall Verbal Expression: Appears within functional limits for tasks assessed   Oral / Motor  Oral Motor/Sensory Function Overall Oral Motor/Sensory Function: Moderate impairment Facial ROM: Reduced right;Suspected CN VII (facial) dysfunction Facial Symmetry: Abnormal symmetry right;Suspected CN VII (facial) dysfunction Facial Strength: Reduced right;Suspected CN VII (facial) dysfunction Facial Sensation: Reduced right;Suspected CN V (Trigeminal) dysfunction Lingual ROM: Reduced right;Suspected CN XII (hypoglossal) dysfunction Lingual Symmetry: Abnormal symmetry right;Suspected CN XII (hypoglossal) dysfunction Lingual Strength: Reduced;Suspected CN XII (hypoglossal) dysfunction Motor Speech Overall Motor Speech: Impaired Respiration: Within functional limits Phonation: Normal Resonance: Within functional limits Articulation: Impaired Level of Impairment: Word Intelligibility: Intelligibility reduced Phrase: 25-49% accurate Sentence: 50-74% accurate Conversation: 50-74% accurate Motor Planning: Witnin functional limits Motor Speech Errors: Not applicable   GO                    Juan Quam Laurice 03/23/2017, 1:32 PM

## 2017-03-24 ENCOUNTER — Inpatient Hospital Stay (HOSPITAL_COMMUNITY): Payer: Medicare Other

## 2017-03-24 DIAGNOSIS — I639 Cerebral infarction, unspecified: Secondary | ICD-10-CM

## 2017-03-24 DIAGNOSIS — E43 Unspecified severe protein-calorie malnutrition: Secondary | ICD-10-CM

## 2017-03-24 LAB — BASIC METABOLIC PANEL
Anion gap: 11 (ref 5–15)
BUN: 22 mg/dL — AB (ref 6–20)
CALCIUM: 8.7 mg/dL — AB (ref 8.9–10.3)
CO2: 25 mmol/L (ref 22–32)
CREATININE: 1.45 mg/dL — AB (ref 0.44–1.00)
Chloride: 108 mmol/L (ref 101–111)
GFR, EST AFRICAN AMERICAN: 37 mL/min — AB (ref >60)
GFR, EST NON AFRICAN AMERICAN: 32 mL/min — AB (ref >60)
Glucose, Bld: 149 mg/dL — ABNORMAL HIGH (ref 65–99)
Potassium: 3.3 mmol/L — ABNORMAL LOW (ref 3.5–5.1)
SODIUM: 144 mmol/L (ref 135–145)

## 2017-03-24 LAB — CBC
HCT: 32.6 % — ABNORMAL LOW (ref 36.0–46.0)
Hemoglobin: 10.5 g/dL — ABNORMAL LOW (ref 12.0–15.0)
MCH: 27.8 pg (ref 26.0–34.0)
MCHC: 32.2 g/dL (ref 30.0–36.0)
MCV: 86.2 fL (ref 78.0–100.0)
PLATELETS: 296 10*3/uL (ref 150–400)
RBC: 3.78 MIL/uL — ABNORMAL LOW (ref 3.87–5.11)
RDW: 14.1 % (ref 11.5–15.5)
WBC: 8.8 10*3/uL (ref 4.0–10.5)

## 2017-03-24 LAB — GLUCOSE, CAPILLARY
GLUCOSE-CAPILLARY: 159 mg/dL — AB (ref 65–99)
Glucose-Capillary: 172 mg/dL — ABNORMAL HIGH (ref 65–99)

## 2017-03-24 MED ORDER — CHLORHEXIDINE GLUCONATE 4 % EX LIQD: 60.0000 mL | Freq: Once | CUTANEOUS | Status: DC

## 2017-03-24 MED ORDER — JEVITY 1.2 CAL PO LIQD
1000.0000 mL | ORAL | Status: DC
  Filled 2017-03-24 (×2): qty 1000

## 2017-03-24 MED ORDER — BISACODYL 10 MG RE SUPP
10.0000 mg | Freq: Once | RECTAL | Status: AC
  Administered 2017-03-24: 10 mg via RECTAL
  Filled 2017-03-24: qty 1

## 2017-03-24 MED ORDER — OSMOLITE 1.2 CAL PO LIQD
1000.0000 mL | ORAL | Status: DC
  Administered 2017-03-24 – 2017-03-29 (×4): 1000 mL
  Filled 2017-03-24 (×9): qty 1000

## 2017-03-24 MED ORDER — POVIDONE-IODINE 10 % EX SWAB: 2.0000 "application " | Freq: Once | CUTANEOUS | Status: DC

## 2017-03-24 NOTE — Progress Notes (Signed)
  Speech Language Pathology Treatment: Dysphagia;Cognitive-Linquistic  Patient Details Name: Alyssa Savage MRN: [id number] DOB: Jul 19, 1930 Today's Date: 03/24/2017 Time: 8242-3536 SLP Time Calculation (min) (ACUTE ONLY): 15 min  Assessment / Plan / Recommendation Clinical Impression  F/u after yesterday's MBS.  Pt is scheduled for hip surgery tomorrow.  Cortrak placed.  Basic strategies to improve intelligibility reviewed - pt severely dysarthric, but follows commands/carries over strategies with min cues to improve clarity.  Pt provided with limited ice chips with cues for effortful swallow and lip seal.  Intermittent cough observed.  Will continue to follow for PO readiness.  Pt very motivated.   HPI HPI: 84 y.o.femalewho notes that she has had significant slurred speech since 03/21/17. Head CT was performed which shows right basal ganglia infarct; MRI shows Acute infarction in the posteriorLEFTbasal ganglia, posterior limb internal capsule and radiating white matter tracts.  Recent fall with left hip fracture.       SLP Plan  Continue with current plan of care       Recommendations  Diet recommendations: NPO                Oral Care Recommendations: Oral care QID;Oral care prior to ice chip/H20 Follow up Recommendations: Inpatient Rehab SLP Visit Diagnosis: Dysphagia, oropharyngeal phase (R13.12) Plan: Continue with current plan of care       GO                Juan Quam Laurice 03/24/2017, 4:04 PM

## 2017-03-24 NOTE — Progress Notes (Signed)
NEUROHOSPITALISTS STROKE TEAM - DAILY PROGRESS NOTE   ADMISSION HISTORY:  Alyssa Savage is a 84 y.o. female who notes that she has had significant slurred speech since yesterday. Head CT was performed which shows right basal ganglia infarct. The patient states that she has been having problems predominantly with her left side and has a left femur fracture which I thinks the source of most for complaints. She does not notice any problems with her right side. She was not aware of ever having a stroke before this most recent episode.  LKW: Unclear tpa given?: no, unclear time of onset  SUBJECTIVE (INTERVAL HISTORY)  Her daughter from [place]  Is at the bedside. Patient is found laying in bed in NAD. Overall she feels her condition is unchanged. Voices no new complaints. No new events reported overnight.  Left hip surgery is planned for tomorrow morning.   OBJECTIVE Lab Results: CBC:  Recent Labs  Lab 03/21/17 1950 03/23/17 0524 03/24/17 0414  WBC 8.6 8.6 8.8  HGB 12.1 10.3* 10.5*  HCT 37.3 32.9* 32.6*  MCV 87.6 86.4 86.2  PLT 427* 327 296   BMP: Recent Labs  Lab 03/21/17 1950 03/22/17 0345 03/22/17 1610 03/23/17 0524 03/24/17 0414  NA 147* 150* 148* 146* 144  K <2.0* 2.1* 2.4* 3.0* 3.3*  CL 98* 101 104 107 108  CO2 30 26 27 25 25   GLUCOSE 123* 104* 112* 132* 149*  BUN 25* 29* 25* 25* 22*  CREATININE 1.71* 1.57* 1.55* 1.51* 1.45*  CALCIUM 9.3 8.9 8.9 8.7* 8.7*  MG 1.9 2.2  --  1.8  --    Liver Function Tests:  Recent Labs  Lab 03/21/17 1950  AST 36  ALT 18  ALKPHOS 85  BILITOT 1.0  PROT 8.2*  ALBUMIN 3.4*   Cardiac Enzymes:  Recent Labs  Lab 03/22/17 0117 03/22/17 0345 03/22/17 1418  TROPONINI 0.12* 0.12* 0.12*   Coagulation Studies:  Recent Labs    03/21/17 1950  APTT 31  INR 0.99   Alcohol Level:  Recent Labs  Lab 03/21/17 1949  ETH <10   PHYSICAL EXAM Temp:  [97.9 F (36.6 C)-99 F  (37.2 C)] 99 F (37.2 C) (02/21 0809) Pulse Rate:  [84-92] 84 (02/21 0809) Resp:  [16-18] 18 (02/21 0809) BP: (140-203)/(80-121) 161/116 (02/21 0809) SpO2:  [92 %-99 %] 97 % (02/21 0809) General -   thin, frail, elderly African american woman, in no apparent distress HEENT-  Normocephalic,    Cardiovascular - Regular rate and rhythm  Respiratory - Lungs clear bilaterally. No wheezing. Abdomen - soft and non-tender, BS normal Extremities- no edema or cyanosis. Left hip movements limited due to pain Mental Status: Patient is awake, alert, oriented to [place], place, month, year, and situation. Her speech is very dysarthric and difficult to understand Cranial Nerves: II: Visual Fields are full. Pupils are equal, round, and reactive to light.   III,IV, VI: EOMI without ptosis or diploplia.  V: Facial sensation is symmetric to temperature VII: Facial movement appears weak on the right VIII: hearing is intact to voice X: Uvula elevates symmetrically XI: Shoulder shrug is symmetric. XII: tongue is midline without atrophy or fasciculations.  Motor: Tone is normal. Bulk is normal. She has right arm >right leg weakness. 3/5 in the right arm, 4/5 in the right leg. She moves left arm well, left leg she is limited due to pain and fracture, but has good strength except weakness of ankle dorsiflexion. Sensory: Sensation is symmetric to light  touch and temperature in the arms and legs. Cerebellar: She is unable to perform. His finger with right arm, intact on the left  IMAGING: I have personally reviewed the radiological images below and agree with the radiology interpretations.  Ct Head Wo Contrast Result Date: 03/21/2017 IMPRESSION: 1. Acute nonhemorrhagic RIGHT basal ganglia infarct. 2. Old bilateral basal ganglia and thalamus lacunar infarcts. Moderate to severe chronic small vessel ischemic disease. 3. Acute findings discussed with and reconfirmed by Dr.JON KNAPP on 03/21/2017 at 8:44 pm.  Electronically Signed   By: Elon Alas M.D.   On: 03/21/2017 20:45   Mr Brain Lottie Dawson Contrast Mr Jodene Nam Head Wo Contrast Result Date: 03/22/2017  IMPRESSION: 1.5-2 cm acute infarction in the posterior left basal ganglia, posterior limb internal capsule and radiating white matter tracts. No mass effect or hemorrhage. Widespread chronic small-vessel ischemic changes elsewhere throughout the brain as outlined above. No large vessel occlusion or correctable proximal stenosis. Widespread intracranial medium to small vessel atherosclerotic narrowing and irregularity. Electronically Signed   By: Nelson Chimes M.D.   On: 03/22/2017 11:49   Echocardiogram:                       Left ventricle: The cavity size was normal. There was moderate   concentric hypertrophy. Systolic function was normal. The   estimated ejection fraction was in the range of 60% to 65%. Wall   motion was normal; there were no regional wall motion   abnormalities  B/L Carotid U/S:                                                PENDING     IMPRESSION: Ms. Alyssa Savage is a 84 y.o. female with no PMH who presents with RIGHT sided weakness. MRI reveals:  1.5-2 cm Acute infarction in the posterior LEFT basal ganglia, posterior limb internal capsule and radiating white matter tracts.  Suspected Etiology: Likely small vessel disease Resultant Symptoms: Right sided deficits Stroke Risk Factors: diabetes mellitus, hyperlipidemia and hypertension Other Stroke Risk Factors: Advanced age,    03/24/2017 ASSESSMENT:   Neuro exam stable with Right sided deficits and dysarthria. MRI reveals acute Left BG CVA. ECHO and carotids pending. Patient in need of Left Hip fracture repair at some point. Surgery is high risk due to recent stroke. Will need to avoid Hypotension as to not extend the stroke. Lives alone, daughter lives in [place] and per notes is flying to [place]. Will need significant Rehab and home support.  PLAN   03/24/2017: Continue Aspirin/ Statin, for now Frequent neuro checks Telemetry monitoring PT/OT/SLP Consult PM & Rehab Consult Case Management /MSW Ongoing aggressive stroke risk factor management Patient counseled to be compliant with her antithrombotic medications Patient counseled on Lifestyle modifications including, Diet, Exercise, and Stress Follow up with [facility] in 6 weeks  HX OF STROKES: Old bilateral basal ganglia and thalamus lacunar infarcts.  DYSPHAGIA: NPO until passes SLP swallow evaluation Aspiration Precautions in progress  R/O AFIB: May need 30 day cardiac event monitor at discharge  HYPERTENSION: Stable, some elevated B/P's noted since admission Permissive hypertension (OK if <220/120) for 24-48 hours post stroke and then gradually normalized within 5-7 days. Labetolol PRN Long term BP goal normotensive. May slowly start B/P medications after 48 hours Home Meds: NONE  HYPERLIPIDEMIA:  Component Value Date/Time   CHOL 203 (H) 03/22/2017 0344   TRIG 90 03/22/2017 0344   HDL 68 03/22/2017 0344   CHOLHDL 3.0 03/22/2017 0344   VLDL 18 03/22/2017 0344   LDLCALC 117 (H) 03/22/2017 0344  Home Meds:  NONE LDL  goal < 70 Started on  Lipitor to 40 mg daily Continue statin at discharge  DIABETES: Lab Results  Component Value Date   HGBA1C 6.3 (H) 03/22/2017   No results for input(s): GLUCAP in the last 168 hours. HgbA1c goal < 7.0 Continue CBG monitoring and SSI to maintain glucose 140-180 mg/dl DM education   Other Active Problems: Principal Problem:   Stroke [facility]) Active Problems:   Fall   Fracture of femoral neck, left, closed ([place])   AKI (acute kidney injury) ([place])   Hypertensive emergency   Elevated troponin   Hypokalemia   GERD (gastroesophageal reflux disease)    Hospital day # 3 VTE prophylaxis: SCD's Diet : Diet NPO time specified Fall precautions   FAMILY UPDATES: No family at bedside  TEAM UPDATES: Lavina Hamman, MD   Prior Home Stroke Medications:  No antithrombotic  Discharge Stroke Meds:  Please discharge patient on aspirin 325 mg daily   Disposition: Final discharge disposition not confirmed Therapy Recs:               PENDING Follow Up:  Follow-up Information    Garvin Fila, MD. Schedule an appointment as soon as possible for a visit in 6 week(s).   Specialties:  Neurology, Radiology Contact information: [address] [place] [address] [phone]          Patient, No Pcp Per -PCP Follow up in 1-2 weeks   Case Management aware of need     I have personally examined this patient, reviewed notes, independently viewed imaging studies, participated in medical decision making and plan of care.ROS completed by me personally and pertinent positives fully documented  I have made any additions or clarifications directly to the above note.  She is presented with dysarthria and right hemiparesis due to left brain subcortical infarct from small vessel disease. She also fell and fractured her left hip about a week ago. This presents a tricky situation. She is not going to be able to do much of rehabilitation due to hip pain unless the fracture is treated. She runs a risk of worsening neurological deficits from her stroke during the surgery for hip fracture. Timing of surgery has been decided for tomorrow morning  after discussion with daughter. and patient  who understands risk-benefit. Recommend avoid hypotension during and after surgery for possible and use local or spinal anesthesia: over Gen. Anesthesia. Discussed with patient, daughter and Dr. Posey Pronto Greater than 50% time during this 25 minute visit was spent on counseling and coordination of care about her lacunar infarct, recent hip fracture and discussion about timing of surgery and answering questions  Antony Contras, MD Medical Director [facility] Pager: [phone] 03/24/2017 11:16 AM  To contact  Stroke Continuity provider, please refer to [email or web address] After hours, contact General Neurology

## 2017-03-24 NOTE — Progress Notes (Signed)
OT Cancellation Note  Patient Details Name: Alyssa Savage MRN: [id number] DOB: 1930/03/14   Cancelled Treatment:    Reason Eval/Treat Not Completed: Patient not medically ready; planned for OR tomorrow for L hip fracture. Will follow.    Lou Cal, OT Pager [phone] 03/24/2017   Raymondo Band 03/24/2017, 9:24 AM

## 2017-03-24 NOTE — Progress Notes (Signed)
Patient ID: Alyssa Savage, female   DOB: 03/06/1930, 84 y.o.   MRN: [id number]   LOS: 3 days   Subjective: Doing well, dysarthria slightly improved.    Objective: Vital signs in last 24 hours: Temp:  [97.9 F (36.6 C)-99 F (37.2 C)] 99 F (37.2 C) (02/21 0809) Pulse Rate:  [84-92] 84 (02/21 0809) Resp:  [16-18] 18 (02/21 0809) BP: (140-203)/(80-121) 161/116 (02/21 0809) SpO2:  [92 %-99 %] 97 % (02/21 0809) Last BM Date: (PTA)   Laboratory  CBC Recent Labs    03/23/17 0524 03/24/17 0414  WBC 8.6 8.8  HGB 10.3* 10.5*  HCT 32.9* 32.6*  PLT 327 296   BMET Recent Labs    03/23/17 0524 03/24/17 0414  NA 146* 144  K 3.0* 3.3*  CL 107 108  CO2 25 25  GLUCOSE 132* 149*  BUN 25* 22*  CREATININE 1.51* 1.45*  CALCIUM 8.7* 8.7*     Physical Exam General appearance: alert and no distress   Assessment/Plan: Left femoral neck fx -- Discussed at length with dtr high risk of exacerbation or extension of CVA with anesthesia. Also discussed issue of risk with spinal on ASA vs general. Finally discussed risks of no surgery. After hearing options daughter and pt would like to undergo hemiarthroplasty scheduled for tomorrow morning. Will hold TF after MN.    Lisette Abu, PA-C Orthopedic Surgery [phone] 03/24/2017

## 2017-03-24 NOTE — Progress Notes (Addendum)
Progress Note  Patient Name: Alyssa Savage Date of Encounter: 03/24/2017  Primary Cardiologist: Alyssa Breeding, MD   Subjective   Pleasant elderly lady resting in bed. She has right sided weakness, right facial droop, slurred speech, but alert and oriented. Has left hip pain but no chest pain or shortness of breath.   Inpatient Medications    Scheduled Meds: . aspirin  300 mg Rectal Daily   Or  . aspirin  325 mg Oral Daily  . atorvastatin  40 mg Oral q1800  . pantoprazole (PROTONIX) IV  40 mg Intravenous QHS   Continuous Infusions: . dextrose 5 % and 0.45 % NaCl with KCl 20 mEq/L 75 mL/hr at 03/24/17 0433  . feeding supplement (OSMOLITE 1.2 CAL)     PRN Meds: acetaminophen **OR** acetaminophen (TYLENOL) oral liquid 160 mg/5 mL **OR** acetaminophen, hydrALAZINE, hydrOXYzine, methocarbamol, morphine injection, nitroGLYCERIN, oxyCODONE-acetaminophen, senna-docusate, zolpidem   Vital Signs    Vitals:   03/24/17 0000 03/24/17 0400 03/24/17 0809 03/24/17 1218  BP: 140/87  (!) 161/116 (!) 186/98  Pulse: 86 85 84 81  Resp: 16 18 18 18   Temp: 98.4 F (36.9 C) 98.6 F (37 C) 99 F (37.2 C) 98.9 F (37.2 C)  TempSrc: Axillary Oral Oral Oral  SpO2: 97% 92% 97% 97%  Weight:      Height:        Intake/Output Summary (Last 24 hours) at 03/24/2017 1346 Last data filed at 03/24/2017 0505 Gross per 24 hour  Intake 2242.5 ml  Output -  Net 2242.5 ml   Filed Weights   03/22/17 1233  Weight: 90 lb 9.7 oz (41.1 kg)    Telemetry    Sinus rhythm in the 70's-80's - Personally Reviewed  ECG    Normal sinus rhythm with sinus arrhythmia with LVH with repolarization abnormality, mild non-specific T abn in V5-6 - Personally Reviewed  Physical Exam   GEN: Elderly female with feeding tube in right nare. No acute distress.   Neck: No JVD Cardiac: RRR, no murmurs, rubs, or gallops.  Respiratory: Clear to auscultation bilaterally. GI: Soft, nontender, non-distended  MS: No  edema; left hip fracture Neuro:  Right sided weakness, right facial droop, slurred speech, alert and oriented, answers questions appropriately Psych: Normal affect   Labs    Chemistry Recent Labs  Lab 03/21/17 1950  03/22/17 1610 03/23/17 0524 03/24/17 0414  NA 147*   < > 148* 146* 144  K <2.0*   < > 2.4* 3.0* 3.3*  CL 98*   < > 104 107 108  CO2 30   < > 27 25 25   GLUCOSE 123*   < > 112* 132* 149*  BUN 25*   < > 25* 25* 22*  CREATININE 1.71*   < > 1.55* 1.51* 1.45*  CALCIUM 9.3   < > 8.9 8.7* 8.7*  PROT 8.2*  --   --   --   --   ALBUMIN 3.4*  --   --   --   --   AST 36  --   --   --   --   ALT 18  --   --   --   --   ALKPHOS 85  --   --   --   --   BILITOT 1.0  --   --   --   --   GFRNONAA 26*   < > 30* 31* 32*  GFRAA 30*   < > 34* 35* 37*  ANIONGAP  19*   < > 17* 14 11   < > = values in this interval not displayed.     Hematology Recent Labs  Lab 03/21/17 1950 03/23/17 0524 03/24/17 0414  WBC 8.6 8.6 8.8  RBC 4.26 3.81* 3.78*  HGB 12.1 10.3* 10.5*  HCT 37.3 32.9* 32.6*  MCV 87.6 86.4 86.2  MCH 28.4 27.0 27.8  MCHC 32.4 31.3 32.2  RDW 14.0 14.1 14.1  PLT 427* 327 296    Cardiac Enzymes Recent Labs  Lab 03/22/17 0117 03/22/17 0345 03/22/17 1418  TROPONINI 0.12* 0.12* 0.12*    Recent Labs  Lab 03/21/17 1954  TROPIPOC 0.10*     BNPNo results for input(s): BNP, PROBNP in the last 168 hours.   DDimer No results for input(s): DDIMER in the last 168 hours.   Radiology    Dg Abd Portable 1v  Result Date: 03/23/2017 CLINICAL DATA:  Evaluate feeding tube placement EXAM: PORTABLE ABDOMEN - 1 VIEW COMPARISON:  None FINDINGS: The enteric tube tip is in the projection of the expected location of the distal stomach. Diffuse gaseous distension of the large bowel loops identified. Enteric contrast material from swallow study performed earlier today is identified within nondilated small bowel loops. Large calcified fibroids noted within the pelvis. IMPRESSION: 1.  Enteric tube tip projects over the expected location of the distal stomach. 2. Diffuse gaseous distension of the large bowel loops which may represent colonic ileus or distal large bowel obstruction. Electronically Signed   By: Kerby Moors M.D.   On: 03/23/2017 17:26   Dg Swallowing Func-speech Pathology  Result Date: 03/23/2017 Objective Swallowing Evaluation: Type of Study: MBS-Modified Barium Swallow Study  Patient Details Name: Alyssa Savage MRN: [id number] Date of Birth: Jul 08, 1930 Today's Date: 03/23/2017 Time: SLP Start Time (ACUTE ONLY): 1350 -SLP Stop Time (ACUTE ONLY): 1425 SLP Time Calculation (min) (ACUTE ONLY): 35 min Past Medical History: Past Medical History: Diagnosis Date . Femoral neck fracture ([place]) 03/2017  left  . GERD (gastroesophageal reflux disease)  . Hypertensive emergency 03/2017 . Hypokalemia 03/2017 . Stroke [facility])  Past Surgical History: Past Surgical History: Procedure Laterality Date . NO PAST SURGERIES   HPI: 84 y.o.femalewho notes that she has had significant slurred speech since 03/21/17. Head CT was performed which shows right basal ganglia infarct; MRI shows Acute infarction in the posteriorLEFTbasal ganglia, posterior limb internal capsule and radiating white matter tracts.  Recent fall with left hip fracture.  Subjective: alert, cooperative Assessment / Plan / Recommendation CHL IP CLINICAL IMPRESSIONS 03/23/2017 Clinical Impression Pt presents with a moderate-severe oropharyngeal dysphagia marked by significant impairments in oral control/bolus containment and propulsion, poor labial seal; ineffective laryngeal vestibular closure, leading to eventual penetration and trace aspiration of most consistencies, either before the swallow or after the swallow when residue spills into airway; poor pharyngeal contraction with residuals throughout pharynx.  For now, recommend NPO with temporary enteral feeding (Dr. Posey Pronto has ordered cortrak); allow ice chips after oral care.  SLP  will follow for therapeutic exercise and PO trials.  Pt agrees with plan.  SLP Visit Diagnosis Dysphagia, oropharyngeal phase (R13.12) Attention and concentration deficit following -- Frontal lobe and executive function deficit following -- Impact on safety and function Severe aspiration risk   CHL IP TREATMENT RECOMMENDATION 03/23/2017 Treatment Recommendations Therapy as outlined in treatment plan below   No flowsheet data found. CHL IP DIET RECOMMENDATION 03/23/2017 SLP Diet Recommendations NPO;Alternative means - temporary Liquid Administration via -- Medication Administration Via alternative means Compensations -- Postural  Changes --   CHL IP OTHER RECOMMENDATIONS 03/23/2017 Recommended Consults -- Oral Care Recommendations Oral care QID Other Recommendations --   CHL IP FOLLOW UP RECOMMENDATIONS 03/23/2017 Follow up Recommendations Inpatient Rehab   CHL IP FREQUENCY AND DURATION 03/23/2017 Speech Therapy Frequency (ACUTE ONLY) min 3x week Treatment Duration 2 weeks      CHL IP ORAL PHASE 03/23/2017 Oral Phase Impaired Oral - Pudding Teaspoon -- Oral - Pudding Cup -- Oral - Honey Teaspoon Right anterior bolus loss;Weak lingual manipulation;Lingual pumping;Reduced posterior propulsion;Right pocketing in lateral sulci;Lingual/palatal residue;Piecemeal swallowing;Delayed oral transit;Decreased bolus cohesion;Premature spillage Oral - Honey Cup -- Oral - Nectar Teaspoon Right anterior bolus loss;Weak lingual manipulation;Lingual pumping;Reduced posterior propulsion;Right pocketing in lateral sulci;Lingual/palatal residue;Piecemeal swallowing;Delayed oral transit;Decreased bolus cohesion;Premature spillage Oral - Nectar Cup -- Oral - Nectar Straw -- Oral - Thin Teaspoon Right anterior bolus loss;Weak lingual manipulation;Lingual pumping;Reduced posterior propulsion;Right pocketing in lateral sulci;Lingual/palatal residue;Piecemeal swallowing;Delayed oral transit;Decreased bolus cohesion;Premature spillage Oral - Thin Cup  -- Oral - Thin Straw -- Oral - Puree Right anterior bolus loss;Weak lingual manipulation;Lingual pumping;Reduced posterior propulsion;Right pocketing in lateral sulci;Lingual/palatal residue;Piecemeal swallowing;Delayed oral transit;Decreased bolus cohesion;Premature spillage Oral - Mech Soft -- Oral - Regular -- Oral - Multi-Consistency -- Oral - Pill -- Oral Phase - Comment --  CHL IP PHARYNGEAL PHASE 03/23/2017 Pharyngeal Phase Impaired Pharyngeal- Pudding Teaspoon -- Pharyngeal -- Pharyngeal- Pudding Cup -- Pharyngeal -- Pharyngeal- Honey Teaspoon Delayed swallow initiation-pyriform sinuses;Reduced pharyngeal peristalsis;Reduced epiglottic inversion;Reduced anterior laryngeal mobility;Reduced laryngeal elevation;Reduced airway/laryngeal closure;Reduced tongue base retraction;Penetration/Apiration after swallow;Trace aspiration;Pharyngeal residue - valleculae;Pharyngeal residue - pyriform Pharyngeal Material enters airway, passes BELOW cords and not ejected out despite cough attempt by patient Pharyngeal- Honey Cup -- Pharyngeal -- Pharyngeal- Nectar Teaspoon Delayed swallow initiation-pyriform sinuses;Reduced pharyngeal peristalsis;Reduced epiglottic inversion;Reduced anterior laryngeal mobility;Reduced laryngeal elevation;Reduced airway/laryngeal closure;Reduced tongue base retraction;Penetration/Apiration after swallow;Trace aspiration;Pharyngeal residue - valleculae;Pharyngeal residue - pyriform Pharyngeal Material enters airway, passes BELOW cords and not ejected out despite cough attempt by patient Pharyngeal- Nectar Cup -- Pharyngeal -- Pharyngeal- Nectar Straw -- Pharyngeal -- Pharyngeal- Thin Teaspoon Delayed swallow initiation-pyriform sinuses;Reduced pharyngeal peristalsis;Reduced epiglottic inversion;Reduced anterior laryngeal mobility;Reduced laryngeal elevation;Reduced airway/laryngeal closure;Reduced tongue base retraction;Penetration/Apiration after swallow;Trace aspiration;Pharyngeal residue -  valleculae;Pharyngeal residue - pyriform Pharyngeal Material enters airway, passes BELOW cords and not ejected out despite cough attempt by patient Pharyngeal- Thin Cup -- Pharyngeal -- Pharyngeal- Thin Straw -- Pharyngeal -- Pharyngeal- Puree Delayed swallow initiation-pyriform sinuses;Reduced pharyngeal peristalsis;Reduced epiglottic inversion;Reduced anterior laryngeal mobility;Reduced laryngeal elevation;Reduced tongue base retraction;Pharyngeal residue - valleculae;Pharyngeal residue - pyriform Pharyngeal Material enters airway, remains ABOVE vocal cords and not ejected out Pharyngeal- Mechanical Soft -- Pharyngeal -- Pharyngeal- Regular -- Pharyngeal -- Pharyngeal- Multi-consistency -- Pharyngeal -- Pharyngeal- Pill -- Pharyngeal -- Pharyngeal Comment --  No flowsheet data found. No flowsheet data found. Juan Quam Laurice 03/23/2017, 2:41 PM               Cardiac Studies   Echo 03/22/17 Study Conclusions  - Left ventricle: The cavity size was normal. There was moderate   concentric hypertrophy. Systolic function was normal. The   estimated ejection fraction was in the range of 60% to 65%. Wall   motion was normal; there were no regional wall motion   abnormalities. Doppler parameters are consistent with abnormal   left ventricular relaxation (grade 1 diastolic dysfunction). - Aortic valve: Transvalvular velocity was within the normal range.   There was no stenosis. There was no regurgitation. - Mitral valve: Transvalvular velocity was within the normal  range.   There was no evidence for stenosis. There was no regurgitation. - Right ventricle: The cavity size was normal. Wall thickness was   normal. Systolic function was normal. - Tricuspid valve: There was no regurgitation.   Patient Profile     84 y.o. female with no significant past medical history admitted 03/21/17 for evaluation of slurred speech, fall and left hip pain. Very high BP. Planned for left hip surgery on  03/25/17.  Of note pt has not had health care in approximately 60 years.  Assessment & Plan    CVA: Per neurology, acute infarction in posterior left basal ganglia and likely small vessel disease. Has right sided deficits, slurred speech, facial droop and dysphagia. Feeding tube in place. Pt is alert and oriented. No arrhythmias noted on tele. Continue to monitor and watch for afib. May consider cardiac monitor at discharge to evaluate for hidden afib. Started on statin and aspirin 325 mg by neurology.   Elevated troponins: Mildly elevated in flat trend, 0.12. No EKG changes suggestive of ischemia. No chest pain. Likely related to demand ischemia in setting of immobility after fall and hypertensive urgency.   Left hip fracture: Ortho following and plan for surgery tomorrow. Echo with normal EF, no WMA. No cardiac related contraindication for surgery. She is at acceptable risk for hip surgery from cardiac standpoint.   Hypertension: Neurology allowing permissive hypertension, OK if <220/120. Plan to gradually normalize BP after 48hrs, over then 5-7 days. BP running 161-186/98-116 today. Has prn IV hydralazine.   Hypokalemia: K+ was <2 on admission, Has been replaced. 3.3 today. Continue replacement.   Renal insufficiency: Acute related to dehydration with possibly chronic due to untreated hypertension. IV fluids infusing. SCr 1.71 on admission, trending down to 1.45 today.   For questions or updates, please contact [facility] Please consult [email or web address] for contact info under Cardiology/STEMI.      Signed, Daune Perch, NP  03/24/2017, 1:46 PM

## 2017-03-24 NOTE — Progress Notes (Signed)
Carotid artery duplex has been completed. 1-39% ICA stenosis bilaterally.  03/24/17 11:44 AM Alyssa Savage RVT

## 2017-03-24 NOTE — Progress Notes (Signed)
Pt taken to vascular, will recheck BP when returns

## 2017-03-24 NOTE — Progress Notes (Addendum)
Initial Nutrition Assessment  DOCUMENTATION CODES:   Severe malnutrition in context of chronic illness  INTERVENTION:  Osmolite 1.2 at 52mL/hr, increase by 10 every 12 hours to goal rate of 56mL/hr At goal, provides 1440 calories, 67gm protein, 976mL free water  Recommend monitor K+, Mg, Phos for at least 3 days, MD replete as necessary, patient is at risk for refeeding in setting of Severe Malnutrition  NUTRITION DIAGNOSIS:   Severe Malnutrition related to chronic illness as evidenced by severe muscle depletion, severe fat depletion  GOAL:   Patient will meet greater than or equal to 90% of their needs  MONITOR:   I & O's, Skin, TF tolerance, Vent status, Diet advancement, Labs  REASON FOR ASSESSMENT:   Consult Enteral/tube feeding initiation and management  ASSESSMENT:   Pt. with no known PMH ; admitted on 03/21/2017, presented with complaint of fall, was found to have acute stroke as well as left hip fracture, AKI  Spoke with patient, patient's daughter from [place] at bedside. Per daughter, patient was found on the ground, hip fractured, had been struggling to get around for an unknown period of time. Patient says she was not eating much, but is unable to give detailed recall. Daughter is unsure. Unclear weight history. Cortrak tube placed yesterday. Explained to daughter and patient RD's role in managing tubefeeding. Gaseous abdominal distension noted, per CT.  Labs reviewed:  K+ 3.3  Medications reviewed and include:  D5 1/2 NS 20K+ at 63mL/hr --> 306 calories   NUTRITION - FOCUSED PHYSICAL EXAM:    Most Recent Value  Orbital Region  Severe depletion  Upper Arm Region  Severe depletion  Thoracic and Lumbar Region  Severe depletion  Buccal Region  Severe depletion  Temple Region  Severe depletion  Clavicle Bone Region  Severe depletion  Clavicle and Acromion Bone Region  Severe depletion  Scapular Bone Region  Severe depletion  Dorsal Hand  Severe  depletion  Patellar Region  Moderate depletion  Anterior Thigh Region  Severe depletion  Posterior Calf Region  Severe depletion  Edema (RD Assessment)  None  Hair  Reviewed  Eyes  Reviewed  Mouth  Reviewed  Skin  Reviewed  Nails  Reviewed       Diet Order:  Diet NPO time specified Fall precautions  EDUCATION NEEDS:   Not appropriate for education at this time  Skin:  Skin Assessment: Reviewed RN Assessment  Last BM:  PTA  Height:   Ht Readings from Last 1 Encounters:  03/22/17 5' (1.524 m)    Weight:   Wt Readings from Last 1 Encounters:  03/22/17 90 lb 9.7 oz (41.1 kg)    Ideal Body Weight:  45.45 kg  BMI:  Body mass index is 17.7 kg/m.  Estimated Nutritional Needs:   Kcal:  1655-3748 calories  Protein:  61-70 grams (1.5-1.7g/kg)  Fluid:  >1.5L  Satira Anis. Billy Turvey, MS, RD LDN Inpatient Clinical Dietitian Pager [phone]

## 2017-03-24 NOTE — Progress Notes (Signed)
[facility] Progress Note  Patient: Alyssa Savage ENI:[id number]   PCP: Patient, No Pcp Per DOB: [birth year]   DOA: 03/21/2017   DOS: 03/24/2017   Date of Service: the patient was seen and examined on 03/24/2017  Subjective: Patient's daughter returned from [place].  Patient denies any acute complaint, pain well controlled.  No acute event overnight.  Tolerated core track placement well.  Brief hospital course: Pt. with no known PMH ; admitted on 03/21/2017, presented with complaint of fall, was found to have acute stroke as well as left hip fracture.  Neurology was consulted, stroke workup completed.  Hypokalemia corrected.  Orthopedic also consulted and patient scheduled for surgical correction of the hip fracture on 03/25/2017 Currently further plan is monitor postop recovery.  Assessment and Plan: Acute small vessel left basal ganglia stroke [facility]):   Head CT was performed which shows right basal ganglia stroke MRI brain shows 2 cm acute infarction in posterior left basal ganglia as well as posterior left internal capsule radiating to white matter tract. No mass or hemorrhage. Chronic small vessel disease, no significant large vessel disease on MRI. Blood pressure significantly elevated but will allow permissive HTN in the setting of acute stroke, and ideally will start controlling blood pressure only postoperatively. ASA and lipitor LDL 117, hemoglobin A1c 5.9. 2D transthoracic echocardiography 6065% EF, no acute abnormality. PT/OT consult when able to SLP recommends core track placement.  Consulted.  Fall and fracture of femoral neck, left, closed [facility]): Orthopedic surgeon, Dr.Swinteck was consulted. - Pain control: morphine prn and percocet - When necessary Zofran for nausea - Robaxin for muscle spasm -Biggest issue is medical clearance.   Acceptable risk from a cardiological perspective, high risk from neurological perspective for worsening of her stroke. Discussed with  orthopedic as well as daughter, they are willing to accept the risk. Avoid hypotension during procedure.  As well as postoperative.  AKI (acute kidney injury) [facility]): cre 1.71 and BNU 25. Likely due to prerenal secondary to dehydration Improving with IV fluids.  Monitor.  Hypertensive emergency:  -prn hydralazine for SBP>220  Elevated troponin: trop 0.10. No CP. Likely due to demand ischemia secondary to hypertension and acute stroke. - prn Nitroglycerin, Morphine, and aspirin, lipitor  - 2d echo shows no acute abnormality, preserved EF. Cardiology consult appreciated.  Hypokalemia: K= 2.0  on admission. Aggressively replaced.  Monitor.  GERD (gastroesophageal reflux disease) -protonix  Dysphagia. Patient sustained dysphagia post stroke. Unable to pass MBS. Cor Trac placed. Started on tube feedings. Tube feedings need to be on hold post midnight for surgery tomorrow.  Diet: N.p.o., on cor Trac with tube feeds. DVT Prophylaxis: subcutaneous Heparin Advance goals of care discussion: Full code  Family Communication: family was present at bedside, at the time of interview.  All questions answered.  Disposition:  Discharge to be determined.   Consultants: Neurology, orthopedics, cardiology Procedures: Echocardiogram  Antibiotics: Anti-infectives (From admission, onward)   None       Objective: Physical Exam: Vitals:   03/24/17 0000 03/24/17 0400 03/24/17 0809 03/24/17 1218  BP: 140/87  (!) 161/116 (!) 186/98  Pulse: 86 85 84 81  Resp: 16 18 18 18   Temp: 98.4 F (36.9 C) 98.6 F (37 C) 99 F (37.2 C) 98.9 F (37.2 C)  TempSrc: Axillary Oral Oral Oral  SpO2: 97% 92% 97% 97%  Weight:      Height:        Intake/Output Summary (Last 24 hours) at 03/24/2017 1301 Last data filed at 03/24/2017  0505 Gross per 24 hour  Intake 2242.5 ml  Output -  Net 2242.5 ml   Filed Weights   03/22/17 1233  Weight: 41.1 kg (90 lb 9.7 oz)   General: Alert, Awake and  Oriented to Time, Place and [name]. Appear in mild distress, affect appropriate Eyes: PERRL, Conjunctiva normal ENT: Oral Mucosa clear moist. Neck: no JVD, no Abnormal Mass Or lumps Cardiovascular: S1 and S2 Present, no Murmur, Peripheral Pulses Present Respiratory: normal respiratory effort, Bilateral Air entry equal and Decreased, no use of accessory muscle, Clear to Auscultation, no Crackles, no wheezes Abdomen: Bowel Sound present, Soft and no tenderness, no hernia Skin: no redness, no Rash, no induration Extremities: no Pedal edema, no calf tenderness Neurologic: A aphasia as well as dysarthria.  Right-sided weakness.  Data Reviewed: CBC: Recent Labs  Lab 03/21/17 1950 03/23/17 0524 03/24/17 0414  WBC 8.6 8.6 8.8  NEUTROABS 7.4  --   --   HGB 12.1 10.3* 10.5*  HCT 37.3 32.9* 32.6*  MCV 87.6 86.4 86.2  PLT 427* 327 433   Basic Metabolic Panel: Recent Labs  Lab 03/21/17 1950 03/22/17 0345 03/22/17 1610 03/23/17 0524 03/24/17 0414  NA 147* 150* 148* 146* 144  K <2.0* 2.1* 2.4* 3.0* 3.3*  CL 98* 101 104 107 108  CO2 30 26 27 25 25   GLUCOSE 123* 104* 112* 132* 149*  BUN 25* 29* 25* 25* 22*  CREATININE 1.71* 1.57* 1.55* 1.51* 1.45*  CALCIUM 9.3 8.9 8.9 8.7* 8.7*  MG 1.9 2.2  --  1.8  --     Liver Function Tests: Recent Labs  Lab 03/21/17 1950  AST 36  ALT 18  ALKPHOS 85  BILITOT 1.0  PROT 8.2*  ALBUMIN 3.4*   No results for input(s): LIPASE, AMYLASE in the last 168 hours. No results for input(s): AMMONIA in the last 168 hours. Coagulation Profile: Recent Labs  Lab 03/21/17 1950  INR 0.99   Cardiac Enzymes: Recent Labs  Lab 03/22/17 0117 03/22/17 0345 03/22/17 1418  TROPONINI 0.12* 0.12* 0.12*   BNP (last 3 results) No results for input(s): PROBNP in the last 8760 hours. CBG: No results for input(s): GLUCAP in the last 168 hours. Studies: Dg Abd Portable 1v  Result Date: 03/23/2017 CLINICAL DATA:  Evaluate feeding tube placement EXAM:  PORTABLE ABDOMEN - 1 VIEW COMPARISON:  None FINDINGS: The enteric tube tip is in the projection of the expected location of the distal stomach. Diffuse gaseous distension of the large bowel loops identified. Enteric contrast material from swallow study performed earlier today is identified within nondilated small bowel loops. Large calcified fibroids noted within the pelvis. IMPRESSION: 1. Enteric tube tip projects over the expected location of the distal stomach. 2. Diffuse gaseous distension of the large bowel loops which may represent colonic ileus or distal large bowel obstruction. Electronically Signed   By: Kerby Moors M.D.   On: 03/23/2017 17:26   Dg Swallowing Func-speech Pathology  Result Date: 03/23/2017 Objective Swallowing Evaluation: Type of Study: MBS-Modified Barium Swallow Study  Patient Details Name: Alyssa Savage MRN: [id number] Date of Birth: [birth year] Today's Date: 03/23/2017 Time: SLP Start Time (ACUTE ONLY): 1350 -SLP Stop Time (ACUTE ONLY): 1425 SLP Time Calculation (min) (ACUTE ONLY): 35 min Past Medical History: Past Medical History: Diagnosis Date . Femoral neck fracture ([place]) 03/2017  left  . GERD (gastroesophageal reflux disease)  . Hypertensive emergency 03/2017 . Hypokalemia 03/2017 . Stroke [facility])  Past Surgical History: Past Surgical History: Procedure Laterality Date .  NO PAST SURGERIES   HPI: 84 y.o.femalewho notes that she has had significant slurred speech since 03/21/17. Head CT was performed which shows right basal ganglia infarct; MRI shows Acute infarction in the posteriorLEFTbasal ganglia, posterior limb internal capsule and radiating white matter tracts.  Recent fall with left hip fracture.  Subjective: alert, cooperative Assessment / Plan / Recommendation CHL IP CLINICAL IMPRESSIONS 03/23/2017 Clinical Impression Pt presents with a moderate-severe oropharyngeal dysphagia marked by significant impairments in oral control/bolus containment and propulsion, poor labial  seal; ineffective laryngeal vestibular closure, leading to eventual penetration and trace aspiration of most consistencies, either before the swallow or after the swallow when residue spills into airway; poor pharyngeal contraction with residuals throughout pharynx.  For now, recommend NPO with temporary enteral feeding (Dr. Posey Pronto has ordered cortrak); allow ice chips after oral care.  SLP will follow for therapeutic exercise and PO trials.  Pt agrees with plan.  SLP Visit Diagnosis Dysphagia, oropharyngeal phase (R13.12) Attention and concentration deficit following -- Frontal lobe and executive function deficit following -- Impact on safety and function Severe aspiration risk   CHL IP TREATMENT RECOMMENDATION 03/23/2017 Treatment Recommendations Therapy as outlined in treatment plan below   No flowsheet data found. CHL IP DIET RECOMMENDATION 03/23/2017 SLP Diet Recommendations NPO;Alternative means - temporary Liquid Administration via -- Medication Administration Via alternative means Compensations -- Postural Changes --   CHL IP OTHER RECOMMENDATIONS 03/23/2017 Recommended Consults -- Oral Care Recommendations Oral care QID Other Recommendations --   CHL IP FOLLOW UP RECOMMENDATIONS 03/23/2017 Follow up Recommendations Inpatient Rehab   CHL IP FREQUENCY AND DURATION 03/23/2017 Speech Therapy Frequency (ACUTE ONLY) min 3x week Treatment Duration 2 weeks      CHL IP ORAL PHASE 03/23/2017 Oral Phase Impaired Oral - Pudding Teaspoon -- Oral - Pudding Cup -- Oral - Honey Teaspoon Right anterior bolus loss;Weak lingual manipulation;Lingual pumping;Reduced posterior propulsion;Right pocketing in lateral sulci;Lingual/palatal residue;Piecemeal swallowing;Delayed oral transit;Decreased bolus cohesion;Premature spillage Oral - Honey Cup -- Oral - Nectar Teaspoon Right anterior bolus loss;Weak lingual manipulation;Lingual pumping;Reduced posterior propulsion;Right pocketing in lateral sulci;Lingual/palatal residue;Piecemeal  swallowing;Delayed oral transit;Decreased bolus cohesion;Premature spillage Oral - Nectar Cup -- Oral - Nectar Straw -- Oral - Thin Teaspoon Right anterior bolus loss;Weak lingual manipulation;Lingual pumping;Reduced posterior propulsion;Right pocketing in lateral sulci;Lingual/palatal residue;Piecemeal swallowing;Delayed oral transit;Decreased bolus cohesion;Premature spillage Oral - Thin Cup -- Oral - Thin Straw -- Oral - Puree Right anterior bolus loss;Weak lingual manipulation;Lingual pumping;Reduced posterior propulsion;Right pocketing in lateral sulci;Lingual/palatal residue;Piecemeal swallowing;Delayed oral transit;Decreased bolus cohesion;Premature spillage Oral - Mech Soft -- Oral - Regular -- Oral - Multi-Consistency -- Oral - Pill -- Oral Phase - Comment --  CHL IP PHARYNGEAL PHASE 03/23/2017 Pharyngeal Phase Impaired Pharyngeal- Pudding Teaspoon -- Pharyngeal -- Pharyngeal- Pudding Cup -- Pharyngeal -- Pharyngeal- Honey Teaspoon Delayed swallow initiation-pyriform sinuses;Reduced pharyngeal peristalsis;Reduced epiglottic inversion;Reduced anterior laryngeal mobility;Reduced laryngeal elevation;Reduced airway/laryngeal closure;Reduced tongue base retraction;Penetration/Apiration after swallow;Trace aspiration;Pharyngeal residue - valleculae;Pharyngeal residue - pyriform Pharyngeal Material enters airway, passes BELOW cords and not ejected out despite cough attempt by patient Pharyngeal- Honey Cup -- Pharyngeal -- Pharyngeal- Nectar Teaspoon Delayed swallow initiation-pyriform sinuses;Reduced pharyngeal peristalsis;Reduced epiglottic inversion;Reduced anterior laryngeal mobility;Reduced laryngeal elevation;Reduced airway/laryngeal closure;Reduced tongue base retraction;Penetration/Apiration after swallow;Trace aspiration;Pharyngeal residue - valleculae;Pharyngeal residue - pyriform Pharyngeal Material enters airway, passes BELOW cords and not ejected out despite cough attempt by patient Pharyngeal- Nectar  Cup -- Pharyngeal -- Pharyngeal- Nectar Straw -- Pharyngeal -- Pharyngeal- Thin Teaspoon Delayed swallow initiation-pyriform sinuses;Reduced pharyngeal peristalsis;Reduced epiglottic inversion;Reduced anterior laryngeal mobility;Reduced  laryngeal elevation;Reduced airway/laryngeal closure;Reduced tongue base retraction;Penetration/Apiration after swallow;Trace aspiration;Pharyngeal residue - valleculae;Pharyngeal residue - pyriform Pharyngeal Material enters airway, passes BELOW cords and not ejected out despite cough attempt by patient Pharyngeal- Thin Cup -- Pharyngeal -- Pharyngeal- Thin Straw -- Pharyngeal -- Pharyngeal- Puree Delayed swallow initiation-pyriform sinuses;Reduced pharyngeal peristalsis;Reduced epiglottic inversion;Reduced anterior laryngeal mobility;Reduced laryngeal elevation;Reduced tongue base retraction;Pharyngeal residue - valleculae;Pharyngeal residue - pyriform Pharyngeal Material enters airway, remains ABOVE vocal cords and not ejected out Pharyngeal- Mechanical Soft -- Pharyngeal -- Pharyngeal- Regular -- Pharyngeal -- Pharyngeal- Multi-consistency -- Pharyngeal -- Pharyngeal- Pill -- Pharyngeal -- Pharyngeal Comment --  No flowsheet data found. No flowsheet data found. Juan Quam Laurice 03/23/2017, 2:41 PM               Scheduled Meds: . aspirin  300 mg Rectal Daily   Or  . aspirin  325 mg Oral Daily  . atorvastatin  40 mg Oral q1800  . pantoprazole (PROTONIX) IV  40 mg Intravenous QHS   Continuous Infusions: . dextrose 5 % and 0.45 % NaCl with KCl 20 mEq/L 75 mL/hr at 03/24/17 0433  . feeding supplement (JEVITY 1.2 CAL)     PRN Meds: acetaminophen **OR** acetaminophen (TYLENOL) oral liquid 160 mg/5 mL **OR** acetaminophen, hydrALAZINE, hydrOXYzine, methocarbamol, morphine injection, nitroGLYCERIN, oxyCODONE-acetaminophen, senna-docusate, zolpidem  Time spent: 35 minutes  Author: Berle Mull, MD [facility] Pager: [phone] 03/24/2017 1:01 PM  If  7PM-7AM, please contact night-coverage at [email or web address], password [facility]

## 2017-03-25 ENCOUNTER — Encounter (HOSPITAL_COMMUNITY)
Admission: EM | Disposition: A | Discharge status: Skilled Nursing Facility | Payer: Self-pay | Source: Home / Self Care | Attending: Internal Medicine

## 2017-03-25 ENCOUNTER — Inpatient Hospital Stay (HOSPITAL_COMMUNITY): Payer: Medicare Other | Admitting: Certified Registered Nurse Anesthetist

## 2017-03-25 ENCOUNTER — Inpatient Hospital Stay (HOSPITAL_COMMUNITY): Payer: Medicare Other

## 2017-03-25 ENCOUNTER — Encounter (HOSPITAL_COMMUNITY): Payer: Self-pay | Admitting: Certified Registered Nurse Anesthetist

## 2017-03-25 DIAGNOSIS — S72002A Fracture of unspecified part of neck of left femur, initial encounter for closed fracture: Secondary | ICD-10-CM | POA: Diagnosis present

## 2017-03-25 HISTORY — PX: ANTERIOR APPROACH HEMI HIP ARTHROPLASTY: SHX6690

## 2017-03-25 LAB — COMPREHENSIVE METABOLIC PANEL
ALK PHOS: 73 U/L (ref 38–126)
ALT: 30 U/L (ref 14–54)
AST: 45 U/L — ABNORMAL HIGH (ref 15–41)
Albumin: 2.2 g/dL — ABNORMAL LOW (ref 3.5–5.0)
Anion gap: 11 (ref 5–15)
BUN: 23 mg/dL — ABNORMAL HIGH (ref 6–20)
CALCIUM: 8.4 mg/dL — AB (ref 8.9–10.3)
CHLORIDE: 112 mmol/L — AB (ref 101–111)
CO2: 23 mmol/L (ref 22–32)
CREATININE: 1.41 mg/dL — AB (ref 0.44–1.00)
GFR, EST AFRICAN AMERICAN: 38 mL/min — AB (ref >60)
GFR, EST NON AFRICAN AMERICAN: 33 mL/min — AB (ref >60)
Glucose, Bld: 150 mg/dL — ABNORMAL HIGH (ref 65–99)
Potassium: 3.4 mmol/L — ABNORMAL LOW (ref 3.5–5.1)
Sodium: 146 mmol/L — ABNORMAL HIGH (ref 135–145)
TOTAL PROTEIN: 6.1 g/dL — AB (ref 6.5–8.1)
Total Bilirubin: 1.3 mg/dL — ABNORMAL HIGH (ref 0.3–1.2)

## 2017-03-25 LAB — GLUCOSE, CAPILLARY
GLUCOSE-CAPILLARY: 126 mg/dL — AB (ref 65–99)
GLUCOSE-CAPILLARY: 134 mg/dL — AB (ref 65–99)
Glucose-Capillary: 127 mg/dL — ABNORMAL HIGH (ref 65–99)
Glucose-Capillary: 107 mg/dL — ABNORMAL HIGH (ref 65–99)
Glucose-Capillary: 95 mg/dL (ref 65–99)
Glucose-Capillary: 141 mg/dL — ABNORMAL HIGH (ref 65–99)

## 2017-03-25 LAB — CBC WITH DIFFERENTIAL/PLATELET
BASOS PCT: 0 %
Basophils Absolute: 0 10*3/uL (ref 0.0–0.1)
EOS ABS: 0.2 10*3/uL (ref 0.0–0.7)
Eosinophils Relative: 2 %
HCT: 30.7 % — ABNORMAL LOW (ref 36.0–46.0)
HEMOGLOBIN: 9.9 g/dL — AB (ref 12.0–15.0)
Lymphocytes Relative: 9 %
Lymphs Abs: 0.9 10*3/uL (ref 0.7–4.0)
MCH: 28 pg (ref 26.0–34.0)
MCHC: 32.2 g/dL (ref 30.0–36.0)
MCV: 86.7 fL (ref 78.0–100.0)
Monocytes Absolute: 0.3 10*3/uL (ref 0.1–1.0)
Monocytes Relative: 3 %
NEUTROS PCT: 86 %
Neutro Abs: 9 10*3/uL — ABNORMAL HIGH (ref 1.7–7.7)
Platelets: 243 10*3/uL (ref 150–400)
RBC: 3.54 MIL/uL — AB (ref 3.87–5.11)
RDW: 14.5 % (ref 11.5–15.5)
WBC: 10.4 10*3/uL (ref 4.0–10.5)

## 2017-03-25 LAB — MAGNESIUM: MAGNESIUM: 1.6 mg/dL — AB (ref 1.7–2.4)

## 2017-03-25 LAB — SURGICAL PCR SCREEN
MRSA, PCR: NEGATIVE
Staphylococcus aureus: NEGATIVE

## 2017-03-25 LAB — PREPARE RBC (CROSSMATCH)

## 2017-03-25 LAB — PREALBUMIN: PREALBUMIN: 8.5 mg/dL — AB (ref 18–38)

## 2017-03-25 LAB — PHOSPHORUS: Phosphorus: 2.1 mg/dL — ABNORMAL LOW (ref 2.5–4.6)

## 2017-03-25 SURGERY — HEMIARTHROPLASTY, HIP, DIRECT ANTERIOR APPROACH, FOR FRACTURE
Anesthesia: Monitor Anesthesia Care | Site: Hip | Laterality: Left

## 2017-03-25 MED ORDER — ROCURONIUM BROMIDE 10 MG/ML (PF) SYRINGE
PREFILLED_SYRINGE | INTRAVENOUS | Status: AC
  Filled 2017-03-25: qty 5

## 2017-03-25 MED ORDER — SODIUM CHLORIDE 0.9 % IV SOLN: Freq: Once | INTRAVENOUS | Status: DC

## 2017-03-25 MED ORDER — CEFAZOLIN SODIUM-DEXTROSE 2-4 GM/100ML-% IV SOLN
2.0000 g | Freq: Four times a day (QID) | INTRAVENOUS | Status: AC
  Administered 2017-03-25 (×2): 2 g via INTRAVENOUS
  Filled 2017-03-25 (×2): qty 100

## 2017-03-25 MED ORDER — ONDANSETRON HCL 4 MG PO TABS: 4.0000 mg | ORAL_TABLET | Freq: Four times a day (QID) | ORAL | Status: DC | PRN

## 2017-03-25 MED ORDER — LACTATED RINGERS IV SOLN
INTRAVENOUS | Status: DC | PRN
  Administered 2017-03-25: 11:00:00 via INTRAVENOUS

## 2017-03-25 MED ORDER — LACTATED RINGERS IV SOLN
INTRAVENOUS | Status: DC
  Administered 2017-03-25 – 2017-04-03 (×3): via INTRAVENOUS

## 2017-03-25 MED ORDER — ONDANSETRON HCL 4 MG/2ML IJ SOLN: 4.0000 mg | Freq: Four times a day (QID) | INTRAMUSCULAR | Status: DC | PRN

## 2017-03-25 MED ORDER — PHENOL 1.4 % MT LIQD
1.0000 | OROMUCOSAL | Status: DC | PRN
Start: 2017-03-25 — End: 2017-04-04

## 2017-03-25 MED ORDER — LIDOCAINE 2% (20 MG/ML) 5 ML SYRINGE
INTRAMUSCULAR | Status: AC
  Filled 2017-03-25: qty 5

## 2017-03-25 MED ORDER — ONDANSETRON HCL 4 MG/2ML IJ SOLN
INTRAMUSCULAR | Status: AC
  Filled 2017-03-25: qty 2

## 2017-03-25 MED ORDER — KETOROLAC TROMETHAMINE 30 MG/ML IJ SOLN
INTRAMUSCULAR | Status: AC
  Filled 2017-03-25: qty 1

## 2017-03-25 MED ORDER — PROPOFOL 10 MG/ML IV BOLUS
INTRAVENOUS | Status: DC | PRN
  Administered 2017-03-25 (×2): 20 mg via INTRAVENOUS
  Administered 2017-03-25: 30 mg via INTRAVENOUS

## 2017-03-25 MED ORDER — CEFAZOLIN SODIUM-DEXTROSE 2-3 GM-%(50ML) IV SOLR
INTRAVENOUS | Status: DC | PRN
  Administered 2017-03-25: 2 g via INTRAVENOUS

## 2017-03-25 MED ORDER — CARVEDILOL 3.125 MG PO TABS: 3.1250 mg | ORAL_TABLET | Freq: Two times a day (BID) | ORAL | Status: DC

## 2017-03-25 MED ORDER — LIDOCAINE 2% (20 MG/ML) 5 ML SYRINGE
INTRAMUSCULAR | Status: DC | PRN
  Administered 2017-03-25: 20 mg via INTRAVENOUS
  Administered 2017-03-25: 30 mg via INTRAVENOUS

## 2017-03-25 MED ORDER — EPHEDRINE 5 MG/ML INJ
INTRAVENOUS | Status: AC
  Filled 2017-03-25: qty 10

## 2017-03-25 MED ORDER — BUPIVACAINE-EPINEPHRINE (PF) 0.5% -1:200000 IJ SOLN
INTRAMUSCULAR | Status: AC
  Filled 2017-03-25: qty 30

## 2017-03-25 MED ORDER — SUGAMMADEX SODIUM 200 MG/2ML IV SOLN
INTRAVENOUS | Status: AC
  Filled 2017-03-25: qty 2

## 2017-03-25 MED ORDER — SODIUM CHLORIDE 0.9 % IR SOLN
Status: DC | PRN
  Administered 2017-03-25: 3000 mL
  Administered 2017-03-25: 250 mL

## 2017-03-25 MED ORDER — BUPIVACAINE IN DEXTROSE 0.75-8.25 % IT SOLN
INTRATHECAL | Status: DC | PRN
  Administered 2017-03-25: 1.6 mg via INTRATHECAL

## 2017-03-25 MED ORDER — PHENYLEPHRINE 40 MCG/ML (10ML) SYRINGE FOR IV PUSH (FOR BLOOD PRESSURE SUPPORT)
PREFILLED_SYRINGE | INTRAVENOUS | Status: AC
  Filled 2017-03-25: qty 10

## 2017-03-25 MED ORDER — CARVEDILOL 3.125 MG PO TABS
3.1250 mg | ORAL_TABLET | Freq: Two times a day (BID) | ORAL | Status: DC
  Administered 2017-03-25 – 2017-04-04 (×21): 3.125 mg
  Filled 2017-03-25 (×21): qty 1

## 2017-03-25 MED ORDER — KETAMINE HCL-SODIUM CHLORIDE 100-0.9 MG/10ML-% IV SOSY
PREFILLED_SYRINGE | INTRAVENOUS | Status: AC
Start: 2017-03-25 — End: 2017-03-25
  Filled 2017-03-25: qty 10

## 2017-03-25 MED ORDER — FENTANYL CITRATE (PF) 100 MCG/2ML IJ SOLN: 25.0000 ug | INTRAMUSCULAR | Status: DC | PRN

## 2017-03-25 MED ORDER — DEXAMETHASONE SODIUM PHOSPHATE 10 MG/ML IJ SOLN
INTRAMUSCULAR | Status: AC
  Filled 2017-03-25: qty 1

## 2017-03-25 MED ORDER — PHENYLEPHRINE 40 MCG/ML (10ML) SYRINGE FOR IV PUSH (FOR BLOOD PRESSURE SUPPORT)
PREFILLED_SYRINGE | INTRAVENOUS | Status: DC | PRN
  Administered 2017-03-25: 80 ug via INTRAVENOUS

## 2017-03-25 MED ORDER — SODIUM CHLORIDE 0.9 % IV SOLN
INTRAVENOUS | Status: DC | PRN
  Administered 2017-03-25: 13:00:00 via INTRAVENOUS

## 2017-03-25 MED ORDER — CEFAZOLIN SODIUM-DEXTROSE 2-4 GM/100ML-% IV SOLN
2.0000 g | INTRAVENOUS | Status: DC
Start: 2017-03-26 — End: 2017-03-25

## 2017-03-25 MED ORDER — SODIUM CHLORIDE 0.9 % IJ SOLN
INTRAMUSCULAR | Status: DC | PRN
  Administered 2017-03-25: 39 mL

## 2017-03-25 MED ORDER — CEFAZOLIN SODIUM-DEXTROSE 2-4 GM/100ML-% IV SOLN
2.0000 g | INTRAVENOUS | Status: AC
  Filled 2017-03-25 (×4): qty 100

## 2017-03-25 MED ORDER — KETAMINE HCL 10 MG/ML IJ SOLN
INTRAMUSCULAR | Status: DC | PRN
  Administered 2017-03-25: 20 mg via INTRAVENOUS

## 2017-03-25 MED ORDER — ONDANSETRON HCL 4 MG/2ML IJ SOLN
INTRAMUSCULAR | Status: DC | PRN
  Administered 2017-03-25: 4 mg via INTRAVENOUS

## 2017-03-25 MED ORDER — PROPOFOL 10 MG/ML IV BOLUS
INTRAVENOUS | Status: AC
  Filled 2017-03-25: qty 20

## 2017-03-25 MED ORDER — PROPOFOL 500 MG/50ML IV EMUL
INTRAVENOUS | Status: DC | PRN
  Administered 2017-03-25: 20 ug/kg/min via INTRAVENOUS

## 2017-03-25 MED ORDER — ENOXAPARIN SODIUM 30 MG/0.3ML ~~LOC~~ SOLN
30.0000 mg | SUBCUTANEOUS | Status: DC
  Administered 2017-03-26 – 2017-03-29 (×4): 30 mg via SUBCUTANEOUS
  Filled 2017-03-25 (×4): qty 0.3

## 2017-03-25 MED ORDER — HYDRALAZINE HCL 20 MG/ML IJ SOLN
10.0000 mg | INTRAMUSCULAR | Status: DC | PRN
  Administered 2017-03-25 – 2017-03-30 (×3): 10 mg via INTRAVENOUS
  Filled 2017-03-25 (×4): qty 1

## 2017-03-25 MED ORDER — BUPIVACAINE-EPINEPHRINE (PF) 0.5% -1:200000 IJ SOLN
INTRAMUSCULAR | Status: DC | PRN
  Administered 2017-03-25: 30 mL via PERINEURAL

## 2017-03-25 MED ORDER — MENTHOL 3 MG MT LOZG: 1.0000 | LOZENGE | OROMUCOSAL | Status: DC | PRN

## 2017-03-25 MED ORDER — HYDROCODONE-ACETAMINOPHEN 5-325 MG PO TABS
1.0000 | ORAL_TABLET | Freq: Four times a day (QID) | ORAL | Status: DC | PRN
  Administered 2017-03-27 – 2017-03-31 (×5): 1 via ORAL
  Administered 2017-04-02: 2 via ORAL
  Administered 2017-04-03: 1 via ORAL
  Filled 2017-03-25 (×5): qty 1
  Filled 2017-03-25: qty 2
  Filled 2017-03-25: qty 1

## 2017-03-25 MED ORDER — EPHEDRINE SULFATE-NACL 50-0.9 MG/10ML-% IV SOSY
PREFILLED_SYRINGE | INTRAVENOUS | Status: DC | PRN
  Administered 2017-03-25: 5 mg via INTRAVENOUS

## 2017-03-25 MED ORDER — FENTANYL CITRATE (PF) 250 MCG/5ML IJ SOLN
INTRAMUSCULAR | Status: AC
  Filled 2017-03-25: qty 5

## 2017-03-25 MED ORDER — DEXTROSE 5 % IV SOLN
INTRAVENOUS | Status: DC | PRN
  Administered 2017-03-25: 60 ug/min via INTRAVENOUS

## 2017-03-25 MED ORDER — KETOROLAC TROMETHAMINE 30 MG/ML IJ SOLN
INTRAMUSCULAR | Status: DC | PRN
  Administered 2017-03-25: 30 mg

## 2017-03-25 MED ORDER — 0.9 % SODIUM CHLORIDE (POUR BTL) OPTIME
TOPICAL | Status: DC | PRN
  Administered 2017-03-25: 1000 mL

## 2017-03-25 SURGICAL SUPPLY — 53 items
BLADE CLIPPER SURG (BLADE) IMPLANT
CANISTER WOUNDNEG PRESSURE 500 (CANNISTER) ×3 IMPLANT
CAPT HIP HEMI 2 ×3 IMPLANT
CHLORAPREP W/TINT 26ML (MISCELLANEOUS) ×3 IMPLANT
COVER SURGICAL LIGHT HANDLE (MISCELLANEOUS) ×3 IMPLANT
DERMABOND ADVANCED (GAUZE/BANDAGES/DRESSINGS) ×4
DERMABOND ADVANCED .7 DNX12 (GAUZE/BANDAGES/DRESSINGS) ×2 IMPLANT
DRAPE C-ARM 42X72 X-RAY (DRAPES) ×3 IMPLANT
DRAPE IMP U-DRAPE 54X76 (DRAPES) ×6 IMPLANT
DRAPE STERI IOBAN 125X83 (DRAPES) ×3 IMPLANT
DRAPE U-SHAPE 47X51 STRL (DRAPES) ×9 IMPLANT
DRSG AQUACEL AG ADV 3.5X10 (GAUZE/BANDAGES/DRESSINGS) ×3 IMPLANT
ELECT BLADE 4.0 EZ CLEAN MEGAD (MISCELLANEOUS) ×3
ELECT REM PT RETURN 9FT ADLT (ELECTROSURGICAL) ×3
ELECTRODE BLDE 4.0 EZ CLN MEGD (MISCELLANEOUS) ×1 IMPLANT
ELECTRODE REM PT RTRN 9FT ADLT (ELECTROSURGICAL) ×1 IMPLANT
EVACUATOR 1/8 PVC DRAIN (DRAIN) IMPLANT
GLOVE BIO SURGEON STRL SZ8.5 (GLOVE) ×6 IMPLANT
GLOVE BIOGEL PI IND STRL 8.5 (GLOVE) ×1 IMPLANT
GLOVE BIOGEL PI INDICATOR 8.5 (GLOVE) ×2
GOWN STRL REUS W/ TWL LRG LVL3 (GOWN DISPOSABLE) ×2 IMPLANT
GOWN STRL REUS W/TWL 2XL LVL3 (GOWN DISPOSABLE) ×3 IMPLANT
GOWN STRL REUS W/TWL LRG LVL3 (GOWN DISPOSABLE) ×4
HANDPIECE INTERPULSE COAX TIP (DISPOSABLE) ×2
HOOD PEEL AWAY FLYTE STAYCOOL (MISCELLANEOUS) ×6 IMPLANT
KIT BASIN OR (CUSTOM PROCEDURE TRAY) ×3 IMPLANT
KIT PREVENA INCISION MGT 13 (CANNISTER) ×3 IMPLANT
KIT ROOM TURNOVER OR (KITS) ×3 IMPLANT
MANIFOLD NEPTUNE II (INSTRUMENTS) ×3 IMPLANT
MARKER SKIN DUAL TIP RULER LAB (MISCELLANEOUS) ×3 IMPLANT
NEEDLE SPNL 18GX3.5 QUINCKE PK (NEEDLE) ×3 IMPLANT
NS IRRIG 1000ML POUR BTL (IV SOLUTION) ×3 IMPLANT
PACK TOTAL JOINT (CUSTOM PROCEDURE TRAY) ×3 IMPLANT
PACK UNIVERSAL I (CUSTOM PROCEDURE TRAY) ×3 IMPLANT
PAD ARMBOARD 7.5X6 YLW CONV (MISCELLANEOUS) ×6 IMPLANT
SAW OSC TIP CART 19.5X105X1.3 (SAW) ×3 IMPLANT
SEALER BIPOLAR AQUA 6.0 (INSTRUMENTS) ×3 IMPLANT
SET HNDPC FAN SPRY TIP SCT (DISPOSABLE) ×1 IMPLANT
SUCTION FRAZIER HANDLE 10FR (MISCELLANEOUS) ×2
SUCTION TUBE FRAZIER 10FR DISP (MISCELLANEOUS) ×1 IMPLANT
SUT ETHIBOND NAB CT1 #1 30IN (SUTURE) ×6 IMPLANT
SUT MNCRL AB 3-0 PS2 18 (SUTURE) ×3 IMPLANT
SUT MON AB 2-0 CT1 36 (SUTURE) ×3 IMPLANT
SUT VIC AB 1 CT1 27 (SUTURE) ×2
SUT VIC AB 1 CT1 27XBRD ANBCTR (SUTURE) ×1 IMPLANT
SUT VIC AB 2-0 CT1 27 (SUTURE) ×2
SUT VIC AB 2-0 CT1 TAPERPNT 27 (SUTURE) ×1 IMPLANT
SUT VLOC 180 0 24IN GS25 (SUTURE) ×3 IMPLANT
SYR 50ML LL SCALE MARK (SYRINGE) ×3 IMPLANT
TOWEL OR 17X24 6PK STRL BLUE (TOWEL DISPOSABLE) ×3 IMPLANT
TOWEL OR 17X26 10 PK STRL BLUE (TOWEL DISPOSABLE) ×3 IMPLANT
TRAY FOLEY CATH SILVER 16FR (SET/KITS/TRAYS/PACK) IMPLANT
WATER STERILE IRR 1000ML POUR (IV SOLUTION) IMPLANT

## 2017-03-25 NOTE — Transfer of Care (Signed)
Immediate Anesthesia Transfer of Care Note  Patient: Alyssa Savage  Procedure(s) Performed: ANTERIOR APPROACH HEMI HIP ARTHROPLASTY (Left Hip)  Patient Location: PACU  Anesthesia Type:MAC and Spinal  Level of Consciousness: awake, alert  and patient cooperative  Airway & Oxygen Therapy: Patient Spontanous Breathing and Patient connected to face mask oxygen  Post-op Assessment: Report given to RN and Post -op Vital signs reviewed and stable  Post vital signs: Reviewed and stable  Last Vitals:  Vitals:   03/25/17 1031 03/25/17 1059  BP: (!) 154/91 (!) 184/98  Pulse:    Resp:    Temp:    SpO2:      Last Pain:  Vitals:   03/25/17 0832  TempSrc: Oral  PainSc:       Patients Stated Pain Goal: 0 (50/93/26 7124)  Complications: No apparent anesthesia complications

## 2017-03-25 NOTE — Interval H&P Note (Signed)
History and Physical Interval Note:  03/25/2017 11:26 AM  Alyssa Savage  has presented today for surgery, with the diagnosis of LEFT FEMORAL NECK FRACTURE  The various methods of treatment have been discussed with the patient and family. After consideration of risks, benefits and other options for treatment, the patient has consented to  Procedure(s): ANTERIOR APPROACH HEMI HIP ARTHROPLASTY (Left) as a surgical intervention .  The patient's history has been reviewed, patient examined, no change in status, stable for surgery.  I have reviewed the patient's chart and labs.  Questions were answered to the patient's satisfaction.    I had a lengthy discussion with the patient and her daughter.  Unfortunately, she has sustained an acute embolic stroke in addition to a femoral neck fracture.  We discussed the treatment options, namely nonoperative versus operative treatment.  We discussed the risks, benefits, and alternatives to both nonoperative and operative treatment.  Patient does have significant hip pain.  Nonoperative treatment would include prolonged bed rest and have significant morbidity/mortality from immobility.  We discussed the risks of surgery, which would include progression of the stroke  in addition to surgical complications.  They understand all of these risks and wished to proceed.  The risks, benefits, and alternatives were discussed with the patient. There are risks associated with the surgery including, but not limited to, problems with anesthesia (death), infection, instability (giving out of the joint), dislocation, differences in leg length/angulation/rotation, fracture of bones, loosening or failure of implants, hematoma (blood accumulation) which may require surgical drainage, blood clots, pulmonary embolism, nerve injury (foot drop and lateral thigh numbness), and blood vessel injury. The patient understands these risks and elects to proceed.   Alyssa Savage Alyssa Savage

## 2017-03-25 NOTE — Progress Notes (Signed)
NEUROHOSPITALISTS STROKE TEAM - DAILY PROGRESS NOTE   ADMISSION HISTORY:  Alyssa Savage is a 84 y.o. female who notes that she has had significant slurred speech since yesterday. Head CT was performed which shows right basal ganglia infarct. The patient states that she has been having problems predominantly with her left side and has a left femur fracture which I thinks the source of most for complaints. She does not notice any problems with her right side. She was not aware of ever having a stroke before this most recent episode.  LKW: Unclear tpa given?: no, unclear time of onset  SUBJECTIVE (INTERVAL HISTORY)  Her multiple family members are at the  bedside. Patient is found laying in bed in NAD. Overall she feels her condition is unchanged. Voices no new complaints. No new events reported overnight.  Left hip surgery is planned for today   OBJECTIVE Lab Results: CBC:  Recent Labs  Lab 03/23/17 0524 03/24/17 0414 03/25/17 0654  WBC 8.6 8.8 10.4  HGB 10.3* 10.5* 9.9*  HCT 32.9* 32.6* 30.7*  MCV 86.4 86.2 86.7  PLT 327 296 243   BMP: Recent Labs  Lab 03/21/17 1950 03/22/17 0345 03/22/17 1610 03/23/17 0524 03/24/17 0414 03/25/17 0654  NA 147* 150* 148* 146* 144 146*  K <2.0* 2.1* 2.4* 3.0* 3.3* 3.4*  CL 98* 101 104 107 108 112*  CO2 30 26 27 25 25 23   GLUCOSE 123* 104* 112* 132* 149* 150*  BUN 25* 29* 25* 25* 22* 23*  CREATININE 1.71* 1.57* 1.55* 1.51* 1.45* 1.41*  CALCIUM 9.3 8.9 8.9 8.7* 8.7* 8.4*  MG 1.9 2.2  --  1.8  --  1.6*  PHOS  --   --   --   --   --  2.1*   Liver Function Tests:  Recent Labs  Lab 03/21/17 1950 03/25/17 0654  AST 36 45*  ALT 18 30  ALKPHOS 85 73  BILITOT 1.0 1.3*  PROT 8.2* 6.1*  ALBUMIN 3.4* 2.2*   Cardiac Enzymes:  Recent Labs  Lab 03/22/17 0117 03/22/17 0345 03/22/17 1418  TROPONINI 0.12* 0.12* 0.12*   Coagulation Studies:  No results for input(s): APTT, INR in the  last 72 hours. Alcohol Level:  Recent Labs  Lab 03/21/17 1949  ETH <10   PHYSICAL EXAM Temp:  [97 F (36.1 C)-99.8 F (37.7 C)] 97 F (36.1 C) (02/22 1326) Pulse Rate:  [71-101] 71 (02/22 1415) Resp:  [13-23] 13 (02/22 1415) BP: (139-189)/(54-113) 162/64 (02/22 1415) SpO2:  [96 %-100 %] 100 % (02/22 1415) Weight:  [92 lb 9.5 oz (42 kg)] 92 lb 9.5 oz (42 kg) (02/22 1054) General -   thin, frail, elderly African [place] woman, in no apparent distress HEENT-  Normocephalic,    Cardiovascular - Regular rate and rhythm  Respiratory - Lungs clear bilaterally. No wheezing. Abdomen - soft and non-tender, BS normal Extremities- no edema or cyanosis. Left hip movements limited due to pain Mental Status: Patient is awake, alert, oriented to Alyssa Savage, place, month, year, and situation. Her speech is very dysarthric and difficult to understand Cranial Nerves: II: Visual Fields are full. Pupils are equal, round, and reactive to light.   III,IV, VI: EOMI without ptosis or diploplia.  V: Facial sensation is symmetric to temperature VII: Facial movement appears weak on the right VIII: hearing is intact to voice X: Uvula elevates symmetrically XI: Shoulder shrug is symmetric. XII: tongue is midline without atrophy or fasciculations.  Motor: Tone is normal. Bulk is  normal. She has right arm >right leg weakness. 3/5 in the right arm, 4/5 in the right leg. She moves left arm well, left leg she is limited due to pain and fracture, but has good strength except weakness of ankle dorsiflexion. Sensory: Sensation is symmetric to light touch and temperature in the arms and legs. Cerebellar: She is unable to perform. His finger with right arm, intact on the left  IMAGING: I have personally reviewed the radiological images below and agree with the radiology interpretations.  Ct Head Wo Contrast Result Date: 03/21/2017 IMPRESSION: 1. Acute nonhemorrhagic RIGHT basal ganglia infarct. 2. Old bilateral  basal ganglia and thalamus lacunar infarcts. Moderate to severe chronic small vessel ischemic disease. 3. Acute findings discussed with and reconfirmed by Dr.JON KNAPP on 03/21/2017 at 8:44 pm. Electronically Signed   By: Elon Alas M.D.   On: 03/21/2017 20:45   Mr Brain Lottie Dawson Contrast Mr Jodene Nam Head Wo Contrast Result Date: 03/22/2017  IMPRESSION: 1.5-2 cm acute infarction in the posterior left basal ganglia, posterior limb internal capsule and radiating white matter tracts. No mass effect or hemorrhage. Widespread chronic small-vessel ischemic changes elsewhere throughout the brain as outlined above. No large vessel occlusion or correctable proximal stenosis. Widespread intracranial medium to small vessel atherosclerotic narrowing and irregularity. Electronically Signed   By: Nelson Chimes M.D.   On: 03/22/2017 11:49   Echocardiogram:                       Left ventricle: The cavity size was normal. There was moderate   concentric hypertrophy. Systolic function was normal. The   estimated ejection fraction was in the range of 60% to 65%. Wall   motion was normal; there were no regional wall motion   abnormalities  B/L Carotid U/S:                                                PENDING     IMPRESSION: Ms. Alyssa Savage is a 84 y.o. female with no PMH who presents with RIGHT sided weakness. MRI reveals:  1.5-2 cm Acute infarction in the posterior LEFT basal ganglia, posterior limb internal capsule and radiating white matter tracts.  Suspected Etiology: Likely small vessel disease Resultant Symptoms: Right sided deficits Stroke Risk Factors: diabetes mellitus, hyperlipidemia and hypertension Other Stroke Risk Factors: Advanced age,    03/25/2017 ASSESSMENT:   Neuro exam stable with Right sided deficits and dysarthria. MRI reveals acute Left BG CVA. ECHO and carotids pending. Patient in need of Left Hip fracture repair at some point. Surgery is high risk due to recent stroke. Will need to  avoid Hypotension as to not extend the stroke. Lives alone, daughter lives in [place] and per notes is flying to [place]. Will need significant Rehab and home support.  PLAN  03/25/2017: Continue Aspirin/ Statin, for now Frequent neuro checks Telemetry monitoring PT/OT/SLP Consult PM & Rehab Consult Case Management /MSW Ongoing aggressive stroke risk factor management Patient counseled to be compliant with her antithrombotic medications Patient counseled on Lifestyle modifications including, Diet, Exercise, and Stress Follow up with [facility] in 6 weeks  HX OF STROKES: Old bilateral basal ganglia and thalamus lacunar infarcts.  DYSPHAGIA: NPO until passes SLP swallow evaluation Aspiration Precautions in progress  R/O AFIB: May need 30 day cardiac event monitor at discharge  HYPERTENSION: Stable, some elevated B/P's noted since admission Permissive hypertension (OK if <220/120) for 24-48 hours post stroke and then gradually normalized within 5-7 days. Labetolol PRN Long term BP goal normotensive. May slowly start B/P medications after 48 hours Home Meds: NONE  HYPERLIPIDEMIA:    Component Value Date/Time   CHOL 203 (H) 03/22/2017 0344   TRIG 90 03/22/2017 0344   HDL 68 03/22/2017 0344   CHOLHDL 3.0 03/22/2017 0344   VLDL 18 03/22/2017 0344   LDLCALC 117 (H) 03/22/2017 0344  Home Meds:  NONE LDL  goal < 70 Started on  Lipitor to 40 mg daily Continue statin at discharge  DIABETES: Lab Results  Component Value Date   HGBA1C 6.3 (H) 03/22/2017   Recent Labs  Lab 03/24/17 2334 03/25/17 0330 03/25/17 0743 03/25/17 1100 03/25/17 1326  GLUCAP 172* 126* 134* 127* 107*   HgbA1c goal < 7.0 Continue CBG monitoring and SSI to maintain glucose 140-180 mg/dl DM education   Other Active Problems: Principal Problem:   Stroke [facility]) Active Problems:   Fall   Fracture of femoral neck, left, closed ([place])   AKI (acute kidney injury) ([place])   Hypertensive  emergency   Elevated troponin   Hypokalemia   GERD (gastroesophageal reflux disease)   Protein-calorie malnutrition, severe    Hospital day # 4 VTE prophylaxis: SCD's Diet : Fall precautions Diet NPO time specified Except for: Sips with Meds   FAMILY UPDATES: No family at bedside  TEAM UPDATES: Bonnell Public, MD   Prior Home Stroke Medications:  No antithrombotic  Discharge Stroke Meds:  Please discharge patient on aspirin 325 mg daily   Disposition: Final discharge disposition not confirmed Therapy Recs:               PENDING Follow Up:  Follow-up Information    Garvin Fila, MD. Schedule an appointment as soon as possible for a visit in 6 week(s).   Specialties:  Neurology, Radiology Contact information: [address] [place] [address] [phone]        Rod Can, MD. Schedule an appointment as soon as possible for a visit on 04/01/2017.   Specialty:  Orthopedic Surgery Why:  [facility] removal Contact information: [address] [place] [address] [phone]        Follow up On 04/01/2017.          Patient, No Pcp Per -PCP Follow up in 1-2 weeks   Case Management aware of need     I have personally examined this patient, reviewed notes, independently viewed imaging studies, participated in medical decision making and plan of care.ROS completed by me personally and pertinent positives fully documented  I have made any additions or clarifications directly to the above note.  She is presented with dysarthria and right hemiparesis due to left brain subcortical infarct from small vessel disease. She also fell and fractured her left hip about a week ago. This presents a tricky situation. She is not going to be able to do much of rehabilitation due to hip pain unless the fracture is treated. She runs a risk of worsening neurological deficits from her stroke during the surgery for hip fracture. Timing of surgery has been decided  for tomorrow morning  after discussion with daughter. and patient  who understands risk-benefit. Recommend avoid hypotension during and after surgery for possible and use local or spinal anesthesia: over Gen. Anesthesia. Discussed with patient, daughter and Dr. Posey Pronto Greater than 50% time during  this 25 minute visit was spent on counseling and coordination of care about her lacunar infarct, recent hip fracture and discussion about timing of surgery and answering questions  Antony Contras, MD Medical Director [place] Pager: [phone] 03/25/2017 2:37 PM  To contact Stroke Continuity provider, please refer to [email or web address] After hours, contact General Neurology

## 2017-03-25 NOTE — Anesthesia Procedure Notes (Signed)
Spinal  Patient location during procedure: OR Start time: 03/25/2017 11:33 AM End time: 03/25/2017 11:40 AM Staffing Anesthesiologist: Oleta Mouse, MD Preanesthetic Checklist Completed: patient identified, surgical consent, pre-op evaluation, timeout performed, IV checked, risks and benefits discussed and monitors and equipment checked Spinal Block Patient position: left lateral decubitus Prep: site prepped and draped and DuraPrep Patient monitoring: heart rate, cardiac monitor, continuous pulse ox and blood pressure Approach: midline Location: L4-5 Injection technique: single-shot Needle Needle type: Pencan  Needle gauge: 24 G Needle length: 10 cm Assessment Sensory level: T4

## 2017-03-25 NOTE — Op Note (Signed)
OPERATIVE REPORT  SURGEON: Rod Can, MD   ASSISTANT: April Green, RNFA.  PREOPERATIVE DIAGNOSIS: Displaced Left femoral neck fracture.   POSTOPERATIVE DIAGNOSIS: Displaced Left femoral neck fracture.   PROCEDURE: Left hip hemiarthroplasty, anterior approach.   IMPLANTS: DePuy Tri Lock stem, size 2, hi offset, with a 44 mm spacer and a -3 mm monopolar head ball.  ANESTHESIA:  Spinal  ANTIBIOTICS: 2g ancef.  ESTIMATED BLOOD LOSS:-150 mL    DRAINS: None.  COMPLICATIONS: None   CONDITION: PACU - hemodynamically stable.   BRIEF CLINICAL NOTE: Alyssa Savage is a 84 y.o. female with a displaced Left femoral neck fracture and acute embolic left-sided basal ganglia stroke. The patient was admitted to the hospitalist service and underwent perioperative risk stratification and medical optimization. The risks, benefits, and alternatives to hemiarthroplasty were explained, and the patient elected to proceed.  PROCEDURE IN DETAIL: The patient was taken to the operating room and general anesthesia was induced on the hospital bed. The patient was then positioned on the Hana table. All bony prominences were well padded. The hip was prepped and draped in the normal sterile surgical fashion. A time-out was called verifying side and site of surgery. Antibiotics were given within 60 minutes of beginning the procedure.  The direct anterior approach to the hip was performed through the Hueter interval. Lateral femoral circumflex vessels were treated with the Auqumantys. The anterior capsule was exposed and an inverted T capsulotomy was made. Fracture hematoma was encountered and evacuated. The patient was found to have a comminuted Left subcapital femoral neck fracture. I freshened the femoral neck cut with a saw. I removed the femoral neck fragment. A corkscrew was placed into the head and the head was removed. This was passed to the back table and was measured.  Acetabular  exposure was achieved. I examined the articular cartilage which was intact. The labrum was intact. A 44 mm trial head was placed and found to have excellent fit.  I then gained femoral exposure taking care to protect the abductors and greater trochanter. This was performed using standard external rotation, extension, and adduction. The capsule was peeled off the inner aspect of the greater trochanter, taking care to preserve the short external rotators. A cookie cutter was used to enter the femoral canal, and then the femoral canal finder was used to confirm location. I then sequentially broached up to a size 2. Calcar planer was used on the femoral neck remnant. I paced a hi neck and a 36 + 1.5 head ball.The hip was reduced. Leg lengths were checked fluoroscopically. The hip was dislocated and trial components were removed. I placed the real stem followed by the real spacer and head ball. A single reduction maneuver was performed and the hip was reduced. Fluoroscopy was used to confirm component position and leg lengths. At 90 degrees of external rotation and extension, the hip was stable to an anterior directed force.  The wound was copiously irrigated with normal saline solution. Marcaine solution was injected into the periarticular soft tissue. The wound was closed in layers using #1 Vicryl and V-Loc for the fascia, 2-0 Vicryl for the subcutaneous fat, 2-0 Monocryl for the deep dermal layer, 2-0 nylon vertical mattress suture for the skin. Incisional Prevena wound VAC was applied.  The patient was then awakened from anesthesia and transported to the recovery room in stable condition. Sponge, needle, and instrument counts were correct at the end of the case x2. The patient tolerated the procedure well and there were  no known complications.  Postoperatively, the patient will be readmitted to the hospitalist service.  She may weight-bear as tolerated with a walker.  We will place her on  Lovenox for DVT prophylaxis.  She will work with physical and occupational therapy.  She will undergo disposition planning.  Upon discharge, her wound VAC will be switched to the portable suction unit.  She will need to return to the office within 7 days of discharge for VAC removal.

## 2017-03-25 NOTE — Anesthesia Preprocedure Evaluation (Signed)
Anesthesia Evaluation  Patient identified by MRN, date of birth, ID band Patient awake    Reviewed: Allergy & Precautions, NPO status , Patient's Chart, lab work & pertinent test results  History of Anesthesia Complications Negative for: history of anesthetic complications  Airway Mallampati: I  TM Distance: >3 FB Neck ROM: Full    Dental  (+) Edentulous Upper, Edentulous Lower   Pulmonary    breath sounds clear to auscultation       Cardiovascular hypertension,  Rhythm:Regular     Neuro/Psych Right hemiparesis CVA, Residual Symptoms negative psych ROS   GI/Hepatic Neg liver ROS, Tube feeds held at midnight   Endo/Other  negative endocrine ROS  Renal/GU ARFRenal disease     Musculoskeletal Left hip fracture   Abdominal   Peds  Hematology negative hematology ROS (+)   Anesthesia Other Findings Elevated troponin felt to be demand ischemia by Cardiology  Reproductive/Obstetrics                             Anesthesia Physical Anesthesia Plan  ASA: III  Anesthesia Plan: Spinal and MAC   Post-op Pain Management:    Induction:   PONV Risk Score and Plan: 2 and Treatment may vary due to age or medical condition  Airway Management Planned: Nasal Cannula  Additional Equipment:   Intra-op Plan:   Post-operative Plan:   Informed Consent: I have reviewed the patients History and Physical, chart, labs and discussed the procedure including the risks, benefits and alternatives for the proposed anesthesia with the patient or authorized representative who has indicated his/her understanding and acceptance.   Dental advisory given  Plan Discussed with: CRNA and Surgeon  Anesthesia Plan Comments:         Anesthesia Quick Evaluation

## 2017-03-25 NOTE — Anesthesia Procedure Notes (Signed)
Procedure Name: MAC Date/Time: 03/25/2017 11:55 AM Performed by: Orlie Dakin, CRNA Pre-anesthesia Checklist: Patient identified, Emergency Drugs available, Suction available, Patient being monitored and Timeout performed Patient Re-evaluated:Patient Re-evaluated prior to induction Oxygen Delivery Method: Simple face mask

## 2017-03-25 NOTE — Progress Notes (Addendum)
[facility] Progress Note  Patient: Alyssa Savage BMW:[id number]   PCP: Patient, No Pcp Per DOB: 1930/05/25   DOA: 03/21/2017   DOS: 03/25/2017   Date of Service: the patient was seen and examined on 03/25/2017  Subjective: Patient seen alongside patient's daughter from [place], to other family members and patient's nurse.  No new changes.  Left hip surgery is planned for today.  However, patient's blood pressure is elevated, with systolic blood pressure greater than 170 mmHg.  Discussed with the orthopedic surgeon, the anesthesiologist will prefer a lower blood pressure.  Discussed with the neurology team, Dr. Antony Contras, and it is okay with Dr. Leonie Man to lower patient's systolic blood pressure to 130-140 mmHg.  No chest pain, no shortness of breath, no fever or chills.  There is no worsening of patient's neurological symptoms.    Brief hospital course: Pt. with no known PMH ; admitted on 03/21/2017, presented with complaint of fall, was found to have acute stroke as well as left hip fracture.  Neurology was consulted, stroke workup completed.  Hypokalemia corrected.  Orthopedic also consulted and patient scheduled for surgical correction of the hip fracture on 03/25/2017 Currently further plan is monitor postop recovery.  Assessment and Plan: Acute small vessel left basal ganglia stroke [facility]):   Head CT was performed which shows right basal ganglia stroke MRI brain shows 2 cm acute infarction in posterior left basal ganglia as well as posterior left internal capsule radiating to white matter tract. No mass or hemorrhage. Chronic small vessel disease, no significant large vessel disease on MRI. Blood pressure significantly elevated but will allow permissive HTN in the setting of acute stroke, and ideally will start controlling blood pressure only postoperatively. ASA and lipitor LDL 117, hemoglobin A1c 5.9. 2D transthoracic echocardiography 6065% EF, no acute abnormality. PT/OT  consult when able to SLP recommends core track placement.  Consulted. 03/25/2017: Neurology input is highly appreciated.  We will continue further management as per the neurology team.  Fall and fracture of femoral neck, left, closed [facility]): Orthopedic surgeon, Dr.Swinteck was consulted. - Pain control: morphine prn and percocet - When necessary Zofran for nausea - Robaxin for muscle spasm -Biggest issue is medical clearance.   Acceptable risk from a cardiological perspective, high risk from neurological perspective for worsening of her stroke. Discussed with orthopedic as well as daughter, they are willing to accept the risk. Avoid hypotension during procedure.  As well as postoperative. 03/25/2017: Will optimize patient's blood pressure.  For surgery later today.  Likely acute kidney injury on chronic kidney disease stage III versus acute kidney injury: -Creatinine is down to 1.41 today.  -Likely, patient has underlying hypertension.  -Continue to monitor.    Hypertensive emergency:  -prn hydralazine for SBP>220 -As mentioned above, the patient likely has Hypertension.  Low potassium is also noted.  There may be need to rule out hyperaldo and renal artery stenosis when patient is more stable. -We will check plasma aldosterone concentration and plasma renin activity. -Will consider workup for possible renal artery stenosis if above is non-revealing. -We will continue to optimize for now. -For them management will depend on hospital course.  Elevated troponin: trop 0.10. No CP. Likely due to demand ischemia secondary to hypertension and acute stroke. - prn Nitroglycerin, Morphine, and aspirin, lipitor  - 2d echo shows no acute abnormality, preserved EF. Cardiology consult appreciated.  Hypokalemia: K= 2.0  on admission. Aggressively replaced.  Monitor. -03/25/2017: Please see above (hypertensive in emergency)  GERD (gastroesophageal reflux  disease) -protonix  Dysphagia. Patient sustained dysphagia post stroke. Unable to pass MBS. Cor Trac placed. Started on tube feedings. Tube feedings need to be on hold post midnight for surgery tomorrow.  Diet: N.p.o., on cor Trac with tube feeds. DVT Prophylaxis: subcutaneous Heparin Advance goals of care discussion: Full code  Family Communication: family was present at bedside, at the time of interview.  All questions answered.  Disposition:  Discharge to be determined.   Consultants: Neurology, orthopedics, cardiology Procedures: Echocardiogram  Antibiotics: Anti-infectives (From admission, onward)   Start     Dose/Rate Route Frequency Ordered Stop   03/26/17 0600  ceFAZolin (ANCEF) IVPB 2g/100 mL premix     2 g 200 mL/hr over 30 Minutes Intravenous To Surgery 03/25/17 1612 03/27/17 0600   03/26/17 0600  ceFAZolin (ANCEF) IVPB 2g/100 mL premix  Status:  Discontinued     2 g 200 mL/hr over 30 Minutes Intravenous On call to O.R. 03/25/17 1612 03/25/17 1616   03/25/17 1615  ceFAZolin (ANCEF) IVPB 2g/100 mL premix     2 g 200 mL/hr over 30 Minutes Intravenous Every 6 hours 03/25/17 1612 03/26/17 0414       Objective: Physical Exam: Vitals:   03/25/17 1535 03/25/17 1600 03/25/17 1630 03/25/17 1822  BP: (!) 180/94 (!) 168/97 (!) 179/99 (!) 177/103  Pulse: 81 69 66 71  Resp: 20 20    Temp: 97.8 F (36.6 C) 98.4 F (36.9 C)    TempSrc: Oral Oral    SpO2: 100%     Weight:      Height:        Intake/Output Summary (Last 24 hours) at 03/25/2017 1824 Last data filed at 03/25/2017 1600 Gross per 24 hour  Intake 4332.25 ml  Output 750 ml  Net 3582.25 ml   Filed Weights   03/22/17 1233 03/25/17 0325 03/25/17 1054  Weight: 41.1 kg (90 lb 9.7 oz) 42 kg (92 lb 9.5 oz) 42 kg (92 lb 9.5 oz)   General: Alert, Awake and Oriented to Time, Place and [place]. Appear in mild distress, affect appropriate Eyes: PERRL, Conjunctiva normal ENT: Oral Mucosa clear moist. Neck: no  JVD, no Abnormal Mass Or lumps Cardiovascular: S1 and S2 Present, no Murmur, Peripheral Pulses Present Respiratory: normal respiratory effort, Bilateral Air entry equal and Decreased, no use of accessory muscle, Clear to Auscultation, no Crackles, no wheezes Abdomen: Bowel Sound present, Soft and no tenderness, no hernia Skin: no redness, no Rash, no induration Extremities: no Pedal edema, no calf tenderness Neurologic: Dysarthric.  Right-sided weakness.  Data Reviewed: CBC: Recent Labs  Lab 03/21/17 1950 03/23/17 0524 03/24/17 0414 03/25/17 0654  WBC 8.6 8.6 8.8 10.4  NEUTROABS 7.4  --   --  9.0*  HGB 12.1 10.3* 10.5* 9.9*  HCT 37.3 32.9* 32.6* 30.7*  MCV 87.6 86.4 86.2 86.7  PLT 427* 327 296 332   Basic Metabolic Panel: Recent Labs  Lab 03/21/17 1950 03/22/17 0345 03/22/17 1610 03/23/17 0524 03/24/17 0414 03/25/17 0654  NA 147* 150* 148* 146* 144 146*  K <2.0* 2.1* 2.4* 3.0* 3.3* 3.4*  CL 98* 101 104 107 108 112*  CO2 30 26 27 25 25 23   GLUCOSE 123* 104* 112* 132* 149* 150*  BUN 25* 29* 25* 25* 22* 23*  CREATININE 1.71* 1.57* 1.55* 1.51* 1.45* 1.41*  CALCIUM 9.3 8.9 8.9 8.7* 8.7* 8.4*  MG 1.9 2.2  --  1.8  --  1.6*  PHOS  --   --   --   --   --  2.1*    Liver Function Tests: Recent Labs  Lab 03/21/17 1950 03/25/17 0654  AST 36 45*  ALT 18 30  ALKPHOS 85 73  BILITOT 1.0 1.3*  PROT 8.2* 6.1*  ALBUMIN 3.4* 2.2*   No results for input(s): LIPASE, AMYLASE in the last 168 hours. No results for input(s): AMMONIA in the last 168 hours. Coagulation Profile: Recent Labs  Lab 03/21/17 1950  INR 0.99   Cardiac Enzymes: Recent Labs  Lab 03/22/17 0117 03/22/17 0345 03/22/17 1418  TROPONINI 0.12* 0.12* 0.12*   BNP (last 3 results) No results for input(s): PROBNP in the last 8760 hours. CBG: Recent Labs  Lab 03/25/17 0330 03/25/17 0743 03/25/17 1100 03/25/17 1326 03/25/17 1621  GLUCAP 126* 134* 127* 107* 95   Studies: Pelvis Portable  Result  Date: 03/25/2017 CLINICAL DATA:  Postop EXAM: PORTABLE PELVIS 1-2 VIEWS COMPARISON:  None. FINDINGS: Left hip arthroplasty, in satisfactory position. Mild degenerative changes of the right hip. Visualized bony pelvis appears intact. Suspected calcified uterine fibroids. Additional residual contrast in the distal small bowel and right colon. IMPRESSION: Left hip arthroplasty, in satisfactory position. Electronically Signed   By: Julian Hy M.D.   On: 03/25/2017 13:57   Dg C-arm 1-60 Min  Result Date: 03/25/2017 CLINICAL DATA:  84 year old female with left femoral neck fracture undergoing ORIF. EXAM: OPERATIVE left HIP (WITH PELVIS IF PERFORMED) 2 VIEWS TECHNIQUE: Fluoroscopic spot image(s) were submitted for interpretation post-operatively. COMPARISON:  Preoperative radiographs 03/21/2017. FINDINGS: 2 intraoperative fluoroscopic AP spot views of the left hip demonstrate a proximal left femur hemiarthroplasty. The femoral head component appears normally aligned in the AP projection with the acetabulum. No unexpected osseous changes. Bulky calcified pelvic mass is redemonstrated, probably severe fibroid uterus. FLUOROSCOPY TIME:  0 min 5 sec IMPRESSION: Left hip hemiarthroplasty with no adverse features. Electronically Signed   By: Genevie Ann M.D.   On: 03/25/2017 13:07   Dg Hip Operative Unilat W Or W/o Pelvis Left  Result Date: 03/25/2017 CLINICAL DATA:  84 year old female with left femoral neck fracture undergoing ORIF. EXAM: OPERATIVE left HIP (WITH PELVIS IF PERFORMED) 2 VIEWS TECHNIQUE: Fluoroscopic spot image(s) were submitted for interpretation post-operatively. COMPARISON:  Preoperative radiographs 03/21/2017. FINDINGS: 2 intraoperative fluoroscopic AP spot views of the left hip demonstrate a proximal left femur hemiarthroplasty. The femoral head component appears normally aligned in the AP projection with the acetabulum. No unexpected osseous changes. Bulky calcified pelvic mass is redemonstrated,  probably severe fibroid uterus. FLUOROSCOPY TIME:  0 min 5 sec IMPRESSION: Left hip hemiarthroplasty with no adverse features. Electronically Signed   By: Genevie Ann M.D.   On: 03/25/2017 13:07    Scheduled Meds: . aspirin  300 mg Rectal Daily   Or  . aspirin  325 mg Oral Daily  . atorvastatin  40 mg Oral q1800  . carvedilol  3.125 mg Per Tube BID WC  . [START ON 03/26/2017] enoxaparin (LOVENOX) injection  30 mg Subcutaneous Q24H  . pantoprazole (PROTONIX) IV  40 mg Intravenous QHS   Continuous Infusions: . [START ON 03/26/2017]  ceFAZolin (ANCEF) IV    .  ceFAZolin (ANCEF) IV 2 g (03/25/17 1823)  . dextrose 5 % and 0.45 % NaCl with KCl 20 mEq/L 980 mL (03/25/17 0708)  . feeding supplement (OSMOLITE 1.2 CAL) 1,000 mL (03/24/17 1600)  . lactated ringers 10 mL/hr at 03/25/17 1103   PRN Meds: acetaminophen **OR** acetaminophen (TYLENOL) oral liquid 160 mg/5 mL **OR** acetaminophen, hydrALAZINE, HYDROcodone-acetaminophen, hydrOXYzine, menthol-cetylpyridinium **OR** phenol, methocarbamol,  morphine injection, nitroGLYCERIN, ondansetron **OR** ondansetron (ZOFRAN) IV, senna-docusate, zolpidem  Time spent: 35 minutes  Author: Dana Allan, MD  [facility] Pager: [phone].  03/25/2017 6:24 PM  If 7PM-7AM, please contact night-coverage at [email or web address], password [email or web address]

## 2017-03-26 DIAGNOSIS — E876 Hypokalemia: Secondary | ICD-10-CM

## 2017-03-26 DIAGNOSIS — I161 Hypertensive emergency: Secondary | ICD-10-CM

## 2017-03-26 LAB — TYPE AND SCREEN
ABO/RH(D): B POS
ANTIBODY SCREEN: NEGATIVE
UNIT DIVISION: 0

## 2017-03-26 LAB — BASIC METABOLIC PANEL
ANION GAP: 10 (ref 5–15)
BUN: 25 mg/dL — ABNORMAL HIGH (ref 6–20)
CO2: 21 mmol/L — AB (ref 22–32)
CREATININE: 1.41 mg/dL — AB (ref 0.44–1.00)
Calcium: 7.8 mg/dL — ABNORMAL LOW (ref 8.9–10.3)
Chloride: 109 mmol/L (ref 101–111)
GFR calc Af Amer: 38 mL/min — ABNORMAL LOW (ref >60)
GFR calc non Af Amer: 33 mL/min — ABNORMAL LOW (ref >60)
GLUCOSE: 170 mg/dL — AB (ref 65–99)
Potassium: 3.6 mmol/L (ref 3.5–5.1)
Sodium: 140 mmol/L (ref 135–145)

## 2017-03-26 LAB — CBC
HEMATOCRIT: 32.6 % — AB (ref 36.0–46.0)
Hemoglobin: 10.7 g/dL — ABNORMAL LOW (ref 12.0–15.0)
MCH: 27.5 pg (ref 26.0–34.0)
MCHC: 32.8 g/dL (ref 30.0–36.0)
MCV: 83.8 fL (ref 78.0–100.0)
Platelets: 211 10*3/uL (ref 150–400)
RBC: 3.89 MIL/uL (ref 3.87–5.11)
RDW: 14.5 % (ref 11.5–15.5)
WBC: 7.6 10*3/uL (ref 4.0–10.5)

## 2017-03-26 LAB — GLUCOSE, CAPILLARY
GLUCOSE-CAPILLARY: 177 mg/dL — AB (ref 65–99)
GLUCOSE-CAPILLARY: 178 mg/dL — AB (ref 65–99)
Glucose-Capillary: 145 mg/dL — ABNORMAL HIGH (ref 65–99)
Glucose-Capillary: 146 mg/dL — ABNORMAL HIGH (ref 65–99)
Glucose-Capillary: 169 mg/dL — ABNORMAL HIGH (ref 65–99)
Glucose-Capillary: 182 mg/dL — ABNORMAL HIGH (ref 65–99)
Glucose-Capillary: 207 mg/dL — ABNORMAL HIGH (ref 65–99)

## 2017-03-26 LAB — CORTISOL-AM, BLOOD: Cortisol - AM: 17 ug/dL (ref 6.7–22.6)

## 2017-03-26 LAB — BPAM RBC
BLOOD PRODUCT EXPIRATION DATE: 201903152359
ISSUE DATE / TIME: 201902221205
Unit Type and Rh: 7300

## 2017-03-26 LAB — MAGNESIUM: Magnesium: 1.4 mg/dL — ABNORMAL LOW (ref 1.7–2.4)

## 2017-03-26 LAB — PHOSPHORUS: Phosphorus: 2.4 mg/dL — ABNORMAL LOW (ref 2.5–4.6)

## 2017-03-26 MED ORDER — FREE WATER
200.0000 mL | Freq: Four times a day (QID) | Status: DC
  Administered 2017-03-26 – 2017-04-04 (×29): 200 mL

## 2017-03-26 MED ORDER — POTASSIUM CHLORIDE CRYS ER 20 MEQ PO TBCR
20.0000 meq | EXTENDED_RELEASE_TABLET | Freq: Once | ORAL | Status: AC
  Administered 2017-03-26: 20 meq via ORAL
  Filled 2017-03-26: qty 1

## 2017-03-26 MED ORDER — MAGNESIUM SULFATE 2 GM/50ML IV SOLN
2.0000 g | Freq: Once | INTRAVENOUS | Status: AC
  Administered 2017-03-26: 2 g via INTRAVENOUS
  Filled 2017-03-26: qty 50

## 2017-03-26 MED ORDER — PANTOPRAZOLE SODIUM 40 MG PO TBEC
40.0000 mg | DELAYED_RELEASE_TABLET | Freq: Every day | ORAL | Status: DC
  Administered 2017-03-26 – 2017-03-27 (×2): 40 mg via ORAL
  Filled 2017-03-26 (×3): qty 1

## 2017-03-26 NOTE — Progress Notes (Signed)
Subjective: 1 Day Post-Op Procedure(s) (LRB): ANTERIOR APPROACH HEMI HIP ARTHROPLASTY (Left) Patient reports pain as 2 on 0-10 scale.No overall change today. Patient followed by Medicine Service.    Objective: Vital signs in last 24 hours: Temp:  [97 F (36.1 C)-98.4 F (36.9 C)] 98.4 F (36.9 C) (02/23 0400) Pulse Rate:  [66-98] 82 (02/23 0400) Resp:  [13-27] 18 (02/23 0400) BP: (135-186)/(54-105) 155/76 (02/23 0400) SpO2:  [93 %-100 %] 100 % (02/23 0400) Weight:  [42 kg (92 lb 9.5 oz)-47.5 kg (104 lb 11.2 oz)] 47.5 kg (104 lb 11.2 oz) (02/23 0500)  Intake/Output from previous day: 02/22 0701 - 02/23 0700 In: 4375 [I.V.:3500; Blood:315; NG/GT:360; IV Piggyback:200] Out: 1100 [Urine:950; Blood:150] Intake/Output this shift: No intake/output data recorded.  Recent Labs    03/24/17 0414 03/25/17 0654 03/26/17 0215  HGB 10.5* 9.9* 10.7*   Recent Labs    03/25/17 0654 03/26/17 0215  WBC 10.4 7.6  RBC 3.54* 3.89  HCT 30.7* 32.6*  PLT 243 211   Recent Labs    03/25/17 0654 03/26/17 0215  NA 146* 140  K 3.4* 3.6  CL 112* 109  CO2 23 21*  BUN 23* 25*  CREATININE 1.41* 1.41*  GLUCOSE 150* 170*  CALCIUM 8.4* 7.8*   No results for input(s): LABPT, INR in the last 72 hours.  Dressing is dry.  Assessment/Plan: 1 Day Post-Op Procedure(s) (LRB): ANTERIOR APPROACH HEMI HIP ARTHROPLASTY (Left) Up with therapy  Alyssa Savage 03/26/2017, 8:48 AM

## 2017-03-26 NOTE — Evaluation (Signed)
Physical Therapy Evaluation Patient Details Name: Alyssa Savage MRN: [id number] DOB: [birth year] Today's Date: 03/26/2017   History of Present Illness  Pt. with no known PMH ; admitted on 03/21/2017, presented with complaint of fall, was found to have acute stroke as well as left hip fracture.  Neurology was consulted, stroke workup completed.  Hypokalemia corrected.  Orthopedic also consulted and patient scheduled for surgical correction of the hip fracture on 03/25/2017    Clinical Impression  Patient is s/p above surgery resulting in functional limitations due to the deficits listed below (see PT Problem List). PTA pt reports she was living alone and independent with all mobility. Unknown level of support available at this time as patient is requesting we speak to her daughter who is not present. Upon eval patient presents with mild post pain and weakness, R sided hemiparesis, balance deficits and dysarthria. Pt is alert and oriented and willing to participate in therapy. Currently max Ax2 level for assistance but able to stand in [place] and correct sitting posture at times EOB this visit.  Plan to speak with daughter when she arrives about discharge recommendations and will amend if appropriate. Patient will benefit from skilled PT to increase their independence and safety with mobility to allow discharge to the venue listed below.       Follow Up Recommendations SNF;Supervision/Assistance - 24 hour    Equipment Recommendations  (TBD)    Recommendations for Other Services       Precautions / Restrictions Precautions Precautions: Fall Restrictions Weight Bearing Restrictions: Yes Other Position/Activity Restrictions: WBAT      Mobility  Bed Mobility Overal bed mobility: Needs Assistance Bed Mobility: Supine to Sit     Supine to sit: Mod assist;+2 for physical assistance     General bed mobility comments: patient with ability to help with L arm and leg, R side weakness unable  to particpiate with.   Transfers Overall transfer level: Needs assistance Equipment used: 2 [place] hand held assist Transfers: Sit to/from Stand Sit to Stand: Max assist;+2 physical assistance         General transfer comment: Max A x2 to power up patient able to drive with LLE and LUE into stedy and tolerate standing for transfer.   Ambulation/Gait             General Gait Details: unable  Stairs            Wheelchair Mobility    Modified Rankin (Stroke Patients Only)       Balance Overall balance assessment: Needs assistance Sitting-balance support: Feet supported;Single extremity supported Sitting balance-Leahy Scale: Poor   Postural control: Right lateral lean;Posterior lean Standing balance support: Single extremity supported Standing balance-Leahy Scale: Poor                               Pertinent Vitals/Pain Pain Assessment: Faces Faces Pain Scale: Hurts a little bit Pain Location: left hip Pain Descriptors / Indicators: Aching Pain Intervention(s): Limited activity within patient's tolerance;Monitored during session;Premedicated before session;Repositioned    Home Living Family/patient expects to be discharged to:: Unsure Living Arrangements: Alone Available Help at Discharge: (unknown at this time, daughter not present to provide hx)                  Prior Function Level of Independence: Independent               Hand Dominance  Extremity/Trunk Assessment   Upper Extremity Assessment Upper Extremity Assessment: Defer to OT evaluation    Lower Extremity Assessment Lower Extremity Assessment: (RLE 1/5 gross, LLE 4-/5 gross. )    Cervical / Trunk Assessment Cervical / Trunk Assessment: Kyphotic(possible scoliosis)  Communication   Communication: (dysarthria)  Cognition Arousal/Alertness: Awake/alert Behavior During Therapy: Flat affect Overall Cognitive Status: Within Functional Limits for tasks  assessed                                        General Comments      Exercises     Assessment/Plan    PT Assessment Patient needs continued PT services  PT Problem List Decreased range of motion;Decreased strength;Decreased activity tolerance;Decreased balance;Decreased mobility;Decreased coordination;Decreased knowledge of use of DME;Pain;Impaired sensation;Impaired tone       PT Treatment Interventions DME instruction;Gait training;Stair training;Functional mobility training;Therapeutic activities;Therapeutic exercise;Balance training    PT Goals (Current goals can be found in the Care Plan section)  Acute Rehab PT Goals Patient Stated Goal: for [place] to speak to her daughter regarding discharge recs PT Goal Formulation: With patient Time For Goal Achievement: 03/31/17 Potential to Achieve Goals: Good    Frequency Min 4X/week   Barriers to discharge Decreased caregiver support pt states she lives alone    Co-evaluation PT/OT/SLP Co-Evaluation/Treatment: Yes Reason for Co-Treatment: Complexity of the patient's impairments (multi-system involvement);Necessary to address cognition/behavior during functional activity;For patient/therapist safety;To address functional/ADL transfers PT goals addressed during session: Mobility/safety with mobility;Balance;Proper use of DME;Strengthening/ROM         AM-PAC PT "6 Clicks" Daily Activity  Outcome Measure Difficulty turning over in bed (including adjusting bedclothes, sheets and blankets)?: Unable Difficulty moving from lying on back to sitting on the side of the bed? : Unable Difficulty sitting down on and standing up from a chair with arms (e.g., wheelchair, bedside commode, etc,.)?: Unable Help needed moving to and from a bed to chair (including a wheelchair)?: Total Help needed walking in hospital room?: Total Help needed climbing 3-5 steps with a railing? : Total 6 Click Score: 6    End of Session Equipment  Utilized During Treatment: Gait belt Activity Tolerance: Patient tolerated treatment well Patient left: in chair;with call bell/phone within reach Nurse Communication: Mobility status PT Visit Diagnosis: Unsteadiness on feet (R26.81);Other abnormalities of gait and mobility (R26.89);Muscle weakness (generalized) (M62.81);Pain;Hemiplegia and hemiparesis;Difficulty in walking, not elsewhere classified (R26.2);Other symptoms and signs involving the nervous system (R29.898) Hemiplegia - Right/Left: Right Hemiplegia - dominant/non-dominant: Dominant Hemiplegia - caused by: Cerebral infarction Pain - Right/Left: Left Pain - part of body: Hip    Time: 1610-9604 PT Time Calculation (min) (ACUTE ONLY): 58 min   Charges:   PT Evaluation $PT Eval Moderate Complexity: 1 Mod PT Treatments $Therapeutic Activity: 8-22 mins   PT G Codes:        Reinaldo Berber, PT, DPT Acute Rehab Services Pager: [phone]    Reinaldo Berber 03/26/2017, 11:27 AM

## 2017-03-26 NOTE — Progress Notes (Signed)
Paged MD for clarification of giving Pt Ancef. It's instructed to give pre procedure. Her surgery was yesterday. Will continue to monitor.

## 2017-03-26 NOTE — Progress Notes (Addendum)
OT Evaluation  PTA, pt lived alone and was independent with ADL and mobility. Pt presents with significant functional status change, requiring max A +2 with mobility and Max to total A with ADL due to deficits listed below. Pt is very motivated to improve. Pt will benefit form extensive rehab at SNF. Will follow acutely to address established goals and facilitate DC to next venue of care.   Pt seen for additional session to assist nursing with mobility with use of Stedy, L hip fx and managing wound vac. Pt required mod A=2 for mobility.Improved strength and ability to mobilize as compared to am session. Pt making excellent progress and very appreciative.    03/26/17 1300  OT Visit Information  Last OT Received On 03/19/17  Assistance Needed +2  PT/OT/SLP Co-Evaluation/Treatment Yes  Reason for Co-Treatment Complexity of the patient's impairments (multi-system involvement);For patient/therapist safety;To address functional/ADL transfers  OT goals addressed during session ADL's and self-care;Proper use of Adaptive equipment and DME  History of Present Illness 84 y.o. female with a displaced Left femoral neck fracture and acute embolic left-sided basal ganglia stroke.Marland Kitchen Underwetn Left hip hemiarthroplasty, anterior approach 2/22.   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)  Pain Assessment  Pain Assessment Faces  Faces Pain Scale 2  Pain Location left hip  Pain Descriptors / Indicators Aching  Pain Intervention(s) Limited activity within patient's tolerance  Cognition  Arousal/Alertness Awake/alert  Behavior  During Therapy WFL for tasks assessed/performed  Overall Cognitive Status Impaired/Different from baseline  Area of Impairment Attention;Awareness;Safety/judgement;Problem solving  Current Attention Level Sustained  Safety/Judgement Decreased awareness of deficits  Awareness Emergent  Problem Solving Slow processing;Difficulty sequencing  Upper Extremity Assessment  Upper Extremity Assessment RUE deficits/detail  RUE Deficits / Details Brunstrom stage 1 - flexor synergy developing; nonfunctional use of RUE at this time  RUE Sensation decreased light touch;decreased proprioception  Lower Extremity Assessment  Lower Extremity Assessment Defer to PT evaluation  Cervical / Trunk Assessment  Cervical / Trunk Assessment Kyphotic (possible scoliosis)  ADL  Overall ADL's  Needs assistance/impaired  Eating/Feeding NPO  Grooming Moderate assistance;Bed level  Upper Body Bathing Moderate assistance;Bed level  Lower Body Bathing Moderate assistance;Bed level  Lower Body Bathing Details (indicate cue type and reason) Pt ableto reach behind and wash bottom in sidelying  Upper Body Dressing  Maximal assistance  Lower Body Dressing Maximal assistance  Toileting- Clothing Manipulation and Hygiene Total assistance  Toileting - Clothing Manipulation Details (indicate cue type and reason) foley  Functional mobility during ADLs Maximal assistance;+2 for physical assistance (used stedy)  General ADL Comments Pt very motivated to be more independent   Vision- History  Baseline Vision/History No visual deficits  Vision- Assessment  Additional Comments will further assess; appears intact  Perception  Comments R inattention; poor awareness midline postrual control  Praxis  Praxis tested? (appears intact)  Bed Mobility  Overal bed mobility Needs Assistance  Bed Mobility Supine to Sit  Supine to sit Mod assist;+2 for physical assistance  General bed mobility comments patient with ability to help with L  arm and leg, R side weakness unable to particpiate with.   Transfers  Overall transfer level Needs assistance  Equipment used  2 [place] hand held assist  Transfer via [place] to/from Stand  Sit to Stand Max assist;+2 physical assistance  General transfer comment Max A x2 to power up patient able to drive with LLE and LUE into stedy and tolerate standing for transfer.   Balance  Overall balance assessment Needs assistance  Sitting-balance support Feet supported;Single extremity supported  Sitting balance-Leahy Scale Poor  Sitting balance - Comments R lateral lean; able to achieve upright posture but unable to maintain  Postural control Right lateral lean;Posterior lean  Standing balance support Single extremity supported  Standing balance-Leahy Scale Poor  OT - End of Session  Equipment Utilized During Treatment Gait belt;Other (comment)  Activity Tolerance Patient tolerated treatment well  Patient left in chair;with call bell/phone within reach;with family/visitor present  Nurse Communication Mobility status;Need for lift equipment (stedy +2 needed)  OT Assessment  OT Recommendation/Assessment Patient needs continued OT Services  OT Visit Diagnosis Other abnormalities of gait and mobility (R26.89);Muscle weakness (generalized) (M62.81);Other symptoms and signs involving cognitive function;Pain;Hemiplegia and hemiparesis  Hemiplegia - Right/Left Right  Hemiplegia - dominant/non-dominant Dominant  Hemiplegia - caused by Cerebral infarction  Pain - Right/Left Left  Pain - part of body Hip  OT Problem List Decreased strength;Decreased range of motion;Decreased activity tolerance;Impaired balance (sitting and/or standing);Decreased coordination;Decreased cognition;Decreased safety awareness;Decreased knowledge of use of DME or AE;Impaired sensation;Impaired tone;Impaired UE functional use;Pain  OT Plan  OT Frequency (ACUTE ONLY) Min 2X/week  OT  Treatment/Interventions (ACUTE ONLY) Self-care/ADL training;Therapeutic exercise;Neuromuscular education;DME and/or AE instruction;Therapeutic activities;Splinting;Cognitive remediation/compensation;Visual/perceptual remediation/compensation;Patient/family education;Balance training  AM-PAC OT "6 Clicks" Daily Activity Outcome Measure  Help from another [place] eating meals? 1  Help from another [place] taking care of personal grooming? 2  Help from another [place] toileting, which includes using toliet, bedpan, or urinal? 2  Help from another [place] bathing (including washing, rinsing, drying)? 2  Help from another [place] to put on and taking off regular upper body clothing? 2  Help from another [place] to put on and taking off regular lower body clothing? 2  6 Click Score 11  ADL G Code Conversion CL  OT Recommendation  Follow Up Recommendations SNF;Supervision/Assistance - 24 hour  OT Equipment Other (comment) (TBA at SNF)  Individuals Consulted  Consulted and Agree with Results and Recommendations Patient;Family member/caregiver  Family Member Consulted daughter  Acute Rehab OT Goals  Patient Stated Goal for [place] to speak to her daughter regarding discharge recs  OT Goal Formulation With patient/family  Time For Goal Achievement 04/09/17  Potential to Achieve Goals Good  OT Time Calculation  OT Start Time (ACUTE ONLY) 1010  OT Stop Time (ACUTE ONLY) 1050  OT Time Calculation (min) 40 min  OT General Charges  $OT Visit 2 Visit  OT Evaluation  $OT Eval Moderate Complexity 1 Mod  OT Treatments  $Self Care/Home Management  23-37 mins  Written Expression  Dominant Hand Right   [facility], OT/L  [phone] 03/26/2017

## 2017-03-26 NOTE — Progress Notes (Signed)
[facility] Progress Note  Patient: Alyssa Savage HQI:[id number]   PCP: Patient, No Pcp Per DOB: December 14, 1930   DOA: 03/21/2017   DOS: 03/26/2017   Date of Service: the patient was seen and examined on 03/26/2017  Subjective: Patient seen alongside patient's daughter.  The patient denied having any pain.  No nausea or vomiting, no chest pain, no shortness of breath, no fever or chills.  This is postop day 1 for left hip hemiarthroplasty.    Brief hospital course: Pt. with no known PMH ; admitted on 03/21/2017, presented with complaint of fall, was found to have acute stroke as well as left hip fracture.  Neurology was consulted, stroke workup completed.  Hypokalemia corrected.  Orthopedic also consulted and patient scheduled for surgical correction of the hip fracture on 03/25/2017 Currently further plan is monitor postop recovery.  Assessment and Plan: Acute small vessel left basal ganglia stroke [facility]):   Head CT was performed which shows right basal ganglia stroke MRI brain shows 2 cm acute infarction in posterior left basal ganglia as well as posterior left internal capsule radiating to white matter tract. No mass or hemorrhage. Chronic small vessel disease, no significant large vessel disease on MRI. Blood pressure significantly elevated but will allow permissive HTN in the setting of acute stroke, and ideally will start controlling blood pressure only postoperatively. ASA and lipitor LDL 117, hemoglobin A1c 5.9. 2D transthoracic echocardiography 6065% EF, no acute abnormality. PT/OT consult when able to SLP recommends core track placement.  Consulted. 03/25/2017: Neurology input is highly appreciated.  We will continue further management as per the neurology team. 03/26/2017: Discontinue IV fluids.  Continue tube feeds.  Repeat swallowing evaluation in the next 2-3 days.  Consider PEG tube placement if patient continues to feel swallowing evaluation.  Will start patient on free water  via the NG tube.  Fall and fracture of femoral neck, left, closed [facility]): Orthopedic surgeon, Dr.Swinteck was consulted. - Pain control: morphine prn and percocet - When necessary Zofran for nausea - Robaxin for muscle spasm -Biggest issue is medical clearance.   Acceptable risk from a cardiological perspective, high risk from neurological perspective for worsening of her stroke. Discussed with orthopedic as well as daughter, they are willing to accept the risk. Avoid hypotension during procedure.  As well as postoperative. 03/25/2017: Will optimize patient's blood pressure.  For surgery later today. 03/26/2017: This is postop day 1 for left hip hemiarthroplasty.  No pain.  Patient remains stable.  Likely acute kidney injury on chronic kidney disease stage III versus acute kidney injury: -Creatinine is down to 1.41 today.  -Likely, patient has underlying hypertension.  -Continue to monitor.    Hypertensive emergency:  -prn hydralazine for SBP>220 -As mentioned above, the patient likely has Hypertension.  Low potassium is also noted.  There may be need to rule out hyperaldo and renal artery stenosis when patient is more stable. -We will check plasma aldosterone concentration and plasma renin activity. -Will consider workup for possible renal artery stenosis if above is non-revealing. -We will continue to optimize for now. -Further management will depend on hospital course. -03/26/2017: The blood pressure today is 146/81 mmHg.  Blood pressure control is improving.  Continue to monitor closely.  Elevated troponin: trop 0.10. No CP. Likely due to demand ischemia secondary to hypertension and acute stroke. - prn Nitroglycerin, Morphine, and aspirin, lipitor  - 2d echo shows no acute abnormality, preserved EF. Cardiology consult appreciated.  Hypokalemia: K= 2.0  on admission. Aggressively replaced.  Monitor. -03/25/2017: Please see above (hypertensive in emergency) 03/26/2017: Potassium  today is 3.6.  Continue to monitor.  Magnesium is 1.4.  Hypomagnesemia: -Magnesium today, 03/26/2017 is 1.4. -We will give IV magnesium 2 g.  We will continue to monitor magnesium level.  GERD (gastroesophageal reflux disease) -protonix  Dysphagia. Patient sustained dysphagia post stroke. Unable to pass MBS. Cor Trac placed. Started on tube feedings. Consider repeat swallowing evaluation in 2-3 days.  PEG tube remains an option if dysphagia continues.  Diet: N.p.o., on cor Trac with tube feeds. DVT Prophylaxis: subcutaneous Heparin Advance goals of care discussion: Full code  Family Communication: family was present at bedside, at the time of interview.  All questions answered.  Disposition:  Discharge to be determined.   Consultants: Neurology, orthopedics, cardiology Procedures: Echocardiogram  Antibiotics: Anti-infectives (From admission, onward)   Start     Dose/Rate Route Frequency Ordered Stop   03/26/17 0600  ceFAZolin (ANCEF) IVPB 2g/100 mL premix     2 g 200 mL/hr over 30 Minutes Intravenous To Surgery 03/25/17 1612 03/27/17 0600   03/26/17 0600  ceFAZolin (ANCEF) IVPB 2g/100 mL premix  Status:  Discontinued     2 g 200 mL/hr over 30 Minutes Intravenous On call to O.R. 03/25/17 1612 03/25/17 1616   03/25/17 1615  ceFAZolin (ANCEF) IVPB 2g/100 mL premix     2 g 200 mL/hr over 30 Minutes Intravenous Every 6 hours 03/25/17 1612 03/25/17 2230       Objective: Physical Exam: Vitals:   03/26/17 0000 03/26/17 0400 03/26/17 0500 03/26/17 1155  BP: (!) 166/98 (!) 155/76  (!) 146/81  Pulse: 98 82  80  Resp: 18 18  16   Temp: 97.8 F (36.6 C) 98.4 F (36.9 C)  98.6 F (37 C)  TempSrc: Oral Axillary  Oral  SpO2: 93% 100%  98%  Weight:   47.5 kg (104 lb 11.2 oz)   Height:        Intake/Output Summary (Last 24 hours) at 03/26/2017 1215 Last data filed at 03/26/2017 0600 Gross per 24 hour  Intake 4075 ml  Output 1100 ml  Net 2975 ml   Filed Weights   03/25/17  0325 03/25/17 1054 03/26/17 0500  Weight: 42 kg (92 lb 9.5 oz) 42 kg (92 lb 9.5 oz) 47.5 kg (104 lb 11.2 oz)   General: Alert, Awake and Oriented to Time, Place and Wearing. Appear in mild distress, affect appropriate Eyes: PERRL, Conjunctiva normal ENT: Oral Mucosa clear moist. Neck: no JVD, no Abnormal Mass Or lumps Cardiovascular: S1 and S2 Present, no Murmur, Peripheral Pulses Present Respiratory: normal respiratory effort, Bilateral Air entry equal and Decreased, no use of accessory muscle, Clear to Auscultation, no Crackles, no wheezes Abdomen: Bowel Sound present, Soft and no tenderness, no hernia Skin: no redness, no Rash, no induration Extremities: no Pedal edema, no calf tenderness Neurologic: Dysarthric.  Right-sided weakness.  Data Reviewed: CBC: Recent Labs  Lab 03/21/17 1950 03/23/17 0524 03/24/17 0414 03/25/17 0654 03/26/17 0215  WBC 8.6 8.6 8.8 10.4 7.6  NEUTROABS 7.4  --   --  9.0*  --   HGB 12.1 10.3* 10.5* 9.9* 10.7*  HCT 37.3 32.9* 32.6* 30.7* 32.6*  MCV 87.6 86.4 86.2 86.7 83.8  PLT 427* 327 296 243 921   Basic Metabolic Panel: Recent Labs  Lab 03/21/17 1950 03/22/17 0345 03/22/17 1610 03/23/17 0524 03/24/17 0414 03/25/17 0654 03/26/17 0215  NA 147* 150* 148* 146* 144 146* 140  K <2.0* 2.1* 2.4* 3.0* 3.3*  3.4* 3.6  CL 98* 101 104 107 108 112* 109  CO2 30 26 27 25 25 23  21*  GLUCOSE 123* 104* 112* 132* 149* 150* 170*  BUN 25* 29* 25* 25* 22* 23* 25*  CREATININE 1.71* 1.57* 1.55* 1.51* 1.45* 1.41* 1.41*  CALCIUM 9.3 8.9 8.9 8.7* 8.7* 8.4* 7.8*  MG 1.9 2.2  --  1.8  --  1.6* 1.4*  PHOS  --   --   --   --   --  2.1* 2.4*    Liver Function Tests: Recent Labs  Lab 03/21/17 1950 03/25/17 0654  AST 36 45*  ALT 18 30  ALKPHOS 85 73  BILITOT 1.0 1.3*  PROT 8.2* 6.1*  ALBUMIN 3.4* 2.2*   No results for input(s): LIPASE, AMYLASE in the last 168 hours. No results for input(s): AMMONIA in the last 168 hours. Coagulation Profile: Recent Labs    Lab 03/21/17 1950  INR 0.99   Cardiac Enzymes: Recent Labs  Lab 03/22/17 0117 03/22/17 0345 03/22/17 1418  TROPONINI 0.12* 0.12* 0.12*   BNP (last 3 results) No results for input(s): PROBNP in the last 8760 hours. CBG: Recent Labs  Lab 03/25/17 2023 03/26/17 0019 03/26/17 0454 03/26/17 0809 03/26/17 1148  GLUCAP 141* 182* 169* 207* 146*   Studies: Pelvis Portable  Result Date: 03/25/2017 CLINICAL DATA:  Postop EXAM: PORTABLE PELVIS 1-2 VIEWS COMPARISON:  None. FINDINGS: Left hip arthroplasty, in satisfactory position. Mild degenerative changes of the right hip. Visualized bony pelvis appears intact. Suspected calcified uterine fibroids. Additional residual contrast in the distal small bowel and right colon. IMPRESSION: Left hip arthroplasty, in satisfactory position. Electronically Signed   By: Julian Hy M.D.   On: 03/25/2017 13:57   Dg C-arm 1-60 Min  Result Date: 03/25/2017 CLINICAL DATA:  84 year old female with left femoral neck fracture undergoing ORIF. EXAM: OPERATIVE left HIP (WITH PELVIS IF PERFORMED) 2 VIEWS TECHNIQUE: Fluoroscopic spot image(s) were submitted for interpretation post-operatively. COMPARISON:  Preoperative radiographs 03/21/2017. FINDINGS: 2 intraoperative fluoroscopic AP spot views of the left hip demonstrate a proximal left femur hemiarthroplasty. The femoral head component appears normally aligned in the AP projection with the acetabulum. No unexpected osseous changes. Bulky calcified pelvic mass is redemonstrated, probably severe fibroid uterus. FLUOROSCOPY TIME:  0 min 5 sec IMPRESSION: Left hip hemiarthroplasty with no adverse features. Electronically Signed   By: Genevie Ann M.D.   On: 03/25/2017 13:07   Dg Hip Operative Unilat W Or W/o Pelvis Left  Result Date: 03/25/2017 CLINICAL DATA:  84 year old female with left femoral neck fracture undergoing ORIF. EXAM: OPERATIVE left HIP (WITH PELVIS IF PERFORMED) 2 VIEWS TECHNIQUE: Fluoroscopic spot  image(s) were submitted for interpretation post-operatively. COMPARISON:  Preoperative radiographs 03/21/2017. FINDINGS: 2 intraoperative fluoroscopic AP spot views of the left hip demonstrate a proximal left femur hemiarthroplasty. The femoral head component appears normally aligned in the AP projection with the acetabulum. No unexpected osseous changes. Bulky calcified pelvic mass is redemonstrated, probably severe fibroid uterus. FLUOROSCOPY TIME:  0 min 5 sec IMPRESSION: Left hip hemiarthroplasty with no adverse features. Electronically Signed   By: Genevie Ann M.D.   On: 03/25/2017 13:07    Scheduled Meds: . aspirin  300 mg Rectal Daily   Or  . aspirin  325 mg Oral Daily  . atorvastatin  40 mg Oral q1800  . carvedilol  3.125 mg Per Tube BID WC  . enoxaparin (LOVENOX) injection  30 mg Subcutaneous Q24H  . pantoprazole (PROTONIX) IV  40 mg  Intravenous QHS  . potassium chloride  20 mEq Oral Once   Continuous Infusions: .  ceFAZolin (ANCEF) IV    . dextrose 5 % and 0.45 % NaCl with KCl 20 mEq/L 75 mL/hr at 03/26/17 1107  . feeding supplement (OSMOLITE 1.2 CAL) 1,000 mL (03/26/17 0600)  . lactated ringers 10 mL/hr at 03/25/17 1103   PRN Meds: acetaminophen **OR** acetaminophen (TYLENOL) oral liquid 160 mg/5 mL **OR** acetaminophen, hydrALAZINE, HYDROcodone-acetaminophen, hydrOXYzine, menthol-cetylpyridinium **OR** phenol, methocarbamol, morphine injection, nitroGLYCERIN, ondansetron **OR** ondansetron (ZOFRAN) IV, senna-docusate, zolpidem  Time spent: 35 minutes  Author: Dana Allan, MD  [facility] Pager: [phone].  03/26/2017 12:15 PM  If 7PM-7AM, please contact night-coverage at [email or web address], password [facility]

## 2017-03-26 NOTE — Progress Notes (Signed)
Orthopedic Tech Progress Note Patient Details:  Alyssa Savage 03-29-1930 [id number]  Ortho Devices Ortho Device/Splint Location: Trapeze bar   Post Interventions Patient Tolerated: Unable to use device properly   [place] Pink 03/26/2017, 12:52 PM

## 2017-03-27 DIAGNOSIS — W19XXXD Unspecified fall, subsequent encounter: Secondary | ICD-10-CM

## 2017-03-27 DIAGNOSIS — Z789 Other specified health status: Secondary | ICD-10-CM

## 2017-03-27 DIAGNOSIS — S72002D Fracture of unspecified part of neck of left femur, subsequent encounter for closed fracture with routine healing: Secondary | ICD-10-CM

## 2017-03-27 LAB — GLUCOSE, CAPILLARY
GLUCOSE-CAPILLARY: 164 mg/dL — AB (ref 65–99)
Glucose-Capillary: 180 mg/dL — ABNORMAL HIGH (ref 65–99)
Glucose-Capillary: 150 mg/dL — ABNORMAL HIGH (ref 65–99)
Glucose-Capillary: 144 mg/dL — ABNORMAL HIGH (ref 65–99)

## 2017-03-27 LAB — BASIC METABOLIC PANEL
ANION GAP: 10 (ref 5–15)
BUN: 30 mg/dL — ABNORMAL HIGH (ref 6–20)
CO2: 20 mmol/L — ABNORMAL LOW (ref 22–32)
Calcium: 7.8 mg/dL — ABNORMAL LOW (ref 8.9–10.3)
Chloride: 109 mmol/L (ref 101–111)
Creatinine, Ser: 1.55 mg/dL — ABNORMAL HIGH (ref 0.44–1.00)
GFR, EST AFRICAN AMERICAN: 34 mL/min — AB (ref >60)
GFR, EST NON AFRICAN AMERICAN: 30 mL/min — AB (ref >60)
GLUCOSE: 178 mg/dL — AB (ref 65–99)
Potassium: 3.4 mmol/L — ABNORMAL LOW (ref 3.5–5.1)
Sodium: 139 mmol/L (ref 135–145)

## 2017-03-27 LAB — CBC
HEMATOCRIT: 30.6 % — AB (ref 36.0–46.0)
Hemoglobin: 9.9 g/dL — ABNORMAL LOW (ref 12.0–15.0)
MCH: 27.3 pg (ref 26.0–34.0)
MCHC: 32.4 g/dL (ref 30.0–36.0)
MCV: 84.5 fL (ref 78.0–100.0)
PLATELETS: 221 10*3/uL (ref 150–400)
RBC: 3.62 MIL/uL — ABNORMAL LOW (ref 3.87–5.11)
RDW: 14.7 % (ref 11.5–15.5)
WBC: 6.4 10*3/uL (ref 4.0–10.5)

## 2017-03-27 LAB — MAGNESIUM: Magnesium: 1.9 mg/dL (ref 1.7–2.4)

## 2017-03-27 LAB — PHOSPHORUS: Phosphorus: 2.2 mg/dL — ABNORMAL LOW (ref 2.5–4.6)

## 2017-03-27 NOTE — Plan of Care (Signed)
Patient progressing in goals

## 2017-03-27 NOTE — Progress Notes (Signed)
Compelte body assessment performed per protocol.  Right hip negative wound therapy intact with suction as 125 mm/hg.  No complains of pain at this time.  HOB elevated 39 degrees.  Osmolite 1.2 infusing at 50 mls/hr via kangaroo pump.  Medication given per order via tube.  Tolerating care well.  Foley catheter remains intact and draining clear amber urine.  Daughter by patient's side.  Will continue to monitor patient.  Safety and comfort maintained,  Ca,ll bell within reach.

## 2017-03-27 NOTE — Progress Notes (Signed)
[facility] Progress Note  Patient: Alyssa Savage PFX:[id number]   PCP: Patient, No Pcp Per DOB: Jun 22, 1930   DOA: 03/21/2017   DOS: 03/27/2017   Date of Service: the patient was seen and examined on 03/27/2017  Subjective: Patient seen today.  No new complaints.  Orthopedic input is highly appreciated.  No chest pain, no shortness of breath, no fever or chills.  Blood pressure is optimize.  The patient is still on tube feeds via the NG tube.  Will repeat swallowing evaluation in the morning.  If dysphagia continues, will consider GI consult for possible PEG tube placement.  We will continue physical therapy input.  This is postop day 2 for left hip hemiarthroplasty.    Brief hospital course: Pt. with no known PMH ; admitted on 03/21/2017, presented with complaint of fall, was found to have acute stroke as well as left hip fracture.  Neurology was consulted, stroke workup completed.  Hypokalemia corrected.  Orthopedic also consulted and patient underwent left hip hemiarthroplasty on 03/25/2017.  Postop and post CVA case in progress.  Assessment and Plan: Acute small vessel left basal ganglia stroke [facility]):   Head CT was performed which shows right basal ganglia stroke MRI brain shows 2 cm acute infarction in posterior left basal ganglia as well as posterior left internal capsule radiating to white matter tract. No mass or hemorrhage. Chronic small vessel disease, no significant large vessel disease on MRI. Blood pressure significantly elevated but will allow permissive HTN in the setting of acute stroke, and ideally will start controlling blood pressure only postoperatively. ASA and lipitor LDL 117, hemoglobin A1c 5.9. 2D transthoracic echocardiography 6065% EF, no acute abnormality. PT/OT consult when able to SLP recommends core track placement.  Consulted. 03/25/2017: Neurology input is highly appreciated.  We will continue further management as per the neurology team. 03/26/2017:  Discontinue IV fluids.  Continue tube feeds.  Repeat swallowing evaluation in the next 2-3 days.  Consider PEG tube placement if patient continues to feel swallowing evaluation.  Will start patient on free water via the NG tube.  Fall and fracture of femoral neck, left, closed [facility]): Orthopedic surgeon, Dr.Swinteck was consulted. - Pain control: morphine prn and percocet - When necessary Zofran for nausea - Robaxin for muscle spasm -Biggest issue is medical clearance.   Acceptable risk from a cardiological perspective, high risk from neurological perspective for worsening of her stroke. Discussed with orthopedic as well as daughter, they are willing to accept the risk. Avoid hypotension during procedure.  As well as postoperative. 03/25/2017: Will optimize patient's blood pressure.  For surgery later today. 03/26/2017: This is postop day 1 for left hip hemiarthroplasty.  No pain.  Patient remains stable.  Likely acute kidney injury on chronic kidney disease stage III versus acute kidney injury: -Creatinine is down to 1.41 today.  -Likely, patient has underlying hypertension.  -Continue to monitor. -03/27/2017: Serum creatinine today is 1.55.  Hypertensive emergency:  -prn hydralazine for SBP>220 -As mentioned above, the patient likely has Hypertension.  Low potassium is also noted.  There may be need to rule out hyperaldo and renal artery stenosis when patient is more stable. -We will check plasma aldosterone concentration and plasma renin activity. -Will consider workup for possible renal artery stenosis if above is non-revealing. -We will continue to optimize for now. -Further management will depend on hospital course. -03/26/2017: The blood pressure today is 146/81 mmHg.  Blood pressure control is improving.  Continue to monitor closely.  Elevated troponin: trop 0.10.  No CP. Likely due to demand ischemia secondary to hypertension and acute stroke. - prn Nitroglycerin, Morphine, and  aspirin, lipitor  - 2d echo shows no acute abnormality, preserved EF. Cardiology consult appreciated.  Hypokalemia: K= 2.0  on admission. Aggressively replaced.  Monitor. -03/25/2017: Please see above (hypertensive in emergency) 03/26/2017: Potassium today is 3.6.  Continue to monitor.  Magnesium is 1.4.  Hypomagnesemia: -Magnesium today, 03/26/2017 is 1.4. -We will give IV magnesium 2 g.  We will continue to monitor magnesium level. -03/27/2017: Magnesium today is 1.9.  GERD (gastroesophageal reflux disease) -protonix  Dysphagia. Patient sustained dysphagia post stroke. Unable to pass MBS. Cor Trac placed. Started on tube feedings. Consider repeat swallowing evaluation in 2-3 days.  PEG tube remains an option if dysphagia continues. 03/27/2017: We will consider swallowing evaluation again in the morning.  Diet: N.p.o., on cor Trac with tube feeds. DVT Prophylaxis: subcutaneous Heparin Advance goals of care discussion: Full code  Family Communication: family was present at bedside, at the time of interview.  All questions answered.  Disposition:  Discharge to be determined.   Consultants: Neurology, orthopedics, cardiology Procedures: Echocardiogram  Antibiotics: Anti-infectives (From admission, onward)   Start     Dose/Rate Route Frequency Ordered Stop   03/26/17 0600  ceFAZolin (ANCEF) IVPB 2g/100 mL premix     2 g 200 mL/hr over 30 Minutes Intravenous To Surgery 03/25/17 1612 03/27/17 0600   03/26/17 0600  ceFAZolin (ANCEF) IVPB 2g/100 mL premix  Status:  Discontinued     2 g 200 mL/hr over 30 Minutes Intravenous On call to O.R. 03/25/17 1612 03/25/17 1616   03/25/17 1615  ceFAZolin (ANCEF) IVPB 2g/100 mL premix     2 g 200 mL/hr over 30 Minutes Intravenous Every 6 hours 03/25/17 1612 03/25/17 2230       Objective: Physical Exam: Vitals:   03/27/17 0200 03/27/17 0400 03/27/17 0837 03/27/17 1234  BP:  133/85 (!) 167/73 (!) 169/84  Pulse:  77 85 80  Resp:  16 16  20   Temp:  98.2 F (36.8 C) 99.2 F (37.3 C) 98.8 F (37.1 C)  TempSrc: Oral Oral Oral Oral  SpO2:  100% 97% 99%  Weight: 47.3 kg (104 lb 4.4 oz)     Height:        Intake/Output Summary (Last 24 hours) at 03/27/2017 1701 Last data filed at 03/27/2017 1236 Gross per 24 hour  Intake 1350 ml  Output 550 ml  Net 800 ml   Filed Weights   03/25/17 1054 03/26/17 0500 03/27/17 0200  Weight: 42 kg (92 lb 9.5 oz) 47.5 kg (104 lb 11.2 oz) 47.3 kg (104 lb 4.4 oz)   General: Alert, Awake and Oriented to Time, Place and [place]. Appear in mild distress, affect appropriate Eyes: PERRL, Conjunctiva normal ENT: Oral Mucosa clear moist. Neck: no JVD, no Abnormal Mass Or lumps Cardiovascular: S1 and S2 Present, no Murmur, Peripheral Pulses Present Respiratory: normal respiratory effort, Bilateral Air entry equal and Decreased, no use of accessory muscle, Clear to Auscultation, no Crackles, no wheezes Abdomen: Bowel Sound present, Soft and no tenderness, no hernia Skin: no redness, no Rash, no induration Extremities: no Pedal edema, no calf tenderness Neurologic: Dysarthric.  Right-sided weakness.  Data Reviewed: CBC: Recent Labs  Lab 03/21/17 1950 03/23/17 0524 03/24/17 0414 03/25/17 0654 03/26/17 0215 03/27/17 0429  WBC 8.6 8.6 8.8 10.4 7.6 6.4  NEUTROABS 7.4  --   --  9.0*  --   --   HGB 12.1 10.3*  10.5* 9.9* 10.7* 9.9*  HCT 37.3 32.9* 32.6* 30.7* 32.6* 30.6*  MCV 87.6 86.4 86.2 86.7 83.8 84.5  PLT 427* 327 296 243 211 539   Basic Metabolic Panel: Recent Labs  Lab 03/22/17 0345  03/23/17 0524 03/24/17 0414 03/25/17 0654 03/26/17 0215 03/27/17 0429  NA 150*   < > 146* 144 146* 140 139  K 2.1*   < > 3.0* 3.3* 3.4* 3.6 3.4*  CL 101   < > 107 108 112* 109 109  CO2 26   < > 25 25 23  21* 20*  GLUCOSE 104*   < > 132* 149* 150* 170* 178*  BUN 29*   < > 25* 22* 23* 25* 30*  CREATININE 1.57*   < > 1.51* 1.45* 1.41* 1.41* 1.55*  CALCIUM 8.9   < > 8.7* 8.7* 8.4* 7.8* 7.8*  MG 2.2   --  1.8  --  1.6* 1.4* 1.9  PHOS  --   --   --   --  2.1* 2.4* 2.2*   < > = values in this interval not displayed.    Liver Function Tests: Recent Labs  Lab 03/21/17 1950 03/25/17 0654  AST 36 45*  ALT 18 30  ALKPHOS 85 73  BILITOT 1.0 1.3*  PROT 8.2* 6.1*  ALBUMIN 3.4* 2.2*   No results for input(s): LIPASE, AMYLASE in the last 168 hours. No results for input(s): AMMONIA in the last 168 hours. Coagulation Profile: Recent Labs  Lab 03/21/17 1950  INR 0.99   Cardiac Enzymes: Recent Labs  Lab 03/22/17 0117 03/22/17 0345 03/22/17 1418  TROPONINI 0.12* 0.12* 0.12*   BNP (last 3 results) No results for input(s): PROBNP in the last 8760 hours. CBG: Recent Labs  Lab 03/26/17 2018 03/26/17 2337 03/27/17 0436 03/27/17 0831 03/27/17 1224  GLUCAP 178* 145* 180* 164* 150*   Studies: No results found.  Scheduled Meds: . aspirin  300 mg Rectal Daily   Or  . aspirin  325 mg Oral Daily  . atorvastatin  40 mg Oral q1800  . carvedilol  3.125 mg Per Tube BID WC  . enoxaparin (LOVENOX) injection  30 mg Subcutaneous Q24H  . free water  200 mL Per Tube Q6H  . pantoprazole  40 mg Oral QHS   Continuous Infusions: . feeding supplement (OSMOLITE 1.2 CAL) 1,000 mL (03/26/17 0600)  . lactated ringers 10 mL/hr at 03/25/17 1103   PRN Meds: acetaminophen **OR** acetaminophen (TYLENOL) oral liquid 160 mg/5 mL **OR** acetaminophen, hydrALAZINE, HYDROcodone-acetaminophen, hydrOXYzine, menthol-cetylpyridinium **OR** phenol, methocarbamol, morphine injection, nitroGLYCERIN, ondansetron **OR** ondansetron (ZOFRAN) IV, senna-docusate, zolpidem  Time spent: 25 minutes  Author: Dana Allan, MD  [facility] Pager: [phone].  03/27/2017 5:01 PM  If 7PM-7AM, please contact night-coverage at [email or web address], password [facility]

## 2017-03-27 NOTE — Progress Notes (Signed)
Subjective: 2 Days Post-Op Procedure(s) (LRB): ANTERIOR APPROACH HEMI HIP ARTHROPLASTY (Left) Patient reports pain as mild.  Well controlled with oral pain meds.  Pt with tube feeds due to dysphagia.  Family at bedside.  Objective: Vital signs in last 24 hours: Temp:  [98.2 F (36.8 C)-99.2 F (37.3 C)] 99.2 F (37.3 C) (02/24 0837) Pulse Rate:  [77-89] 85 (02/24 0837) Resp:  [16-20] 16 (02/24 0837) BP: (101-167)/(55-89) 167/73 (02/24 0837) SpO2:  [96 %-100 %] 97 % (02/24 0837) Weight:  [47.3 kg (104 lb 4.4 oz)] 47.3 kg (104 lb 4.4 oz) (02/24 0200)  Intake/Output from previous day: 02/23 0701 - 02/24 0700 In: 2130 [I.V.:250; NG/GT:1880] Out: 200 [Urine:200] Intake/Output this shift: No intake/output data recorded.  Recent Labs    03/25/17 0654 03/26/17 0215 03/27/17 0429  HGB 9.9* 10.7* 9.9*   Recent Labs    03/26/17 0215 03/27/17 0429  WBC 7.6 6.4  RBC 3.89 3.62*  HCT 32.6* 30.6*  PLT 211 221   Recent Labs    03/26/17 0215 03/27/17 0429  NA 140 139  K 3.6 3.4*  CL 109 109  CO2 21* 20*  BUN 25* 30*  CREATININE 1.41* 1.55*  GLUCOSE 170* 178*  CALCIUM 7.8* 7.8*   No results for input(s): LABPT, INR in the last 72 hours.  WD WD woman in nad.  L hip with prevena dressing in place.  No fluid in reservoir.  1+ DP pulse.  5/5 strength in PF and DF of the ankle.  Assessment/Plan: 2 Days Post-Op Procedure(s) (LRB): ANTERIOR APPROACH HEMI HIP ARTHROPLASTY (Left) Up with therapy  WBAT on L LE.  Alyssa Savage 03/27/2017, 9:20 AM

## 2017-03-28 ENCOUNTER — Encounter (HOSPITAL_COMMUNITY): Payer: Self-pay | Admitting: Orthopedic Surgery

## 2017-03-28 LAB — GLUCOSE, CAPILLARY
GLUCOSE-CAPILLARY: 147 mg/dL — AB (ref 65–99)
GLUCOSE-CAPILLARY: 181 mg/dL — AB (ref 65–99)
GLUCOSE-CAPILLARY: 131 mg/dL — AB (ref 65–99)
GLUCOSE-CAPILLARY: 142 mg/dL — AB (ref 65–99)
GLUCOSE-CAPILLARY: 149 mg/dL — AB (ref 65–99)
Glucose-Capillary: 154 mg/dL — ABNORMAL HIGH (ref 65–99)

## 2017-03-28 LAB — CBC
HEMATOCRIT: 29.9 % — AB (ref 36.0–46.0)
HEMOGLOBIN: 9.6 g/dL — AB (ref 12.0–15.0)
MCH: 27.3 pg (ref 26.0–34.0)
MCHC: 32.1 g/dL (ref 30.0–36.0)
MCV: 84.9 fL (ref 78.0–100.0)
Platelets: 237 10*3/uL (ref 150–400)
RBC: 3.52 MIL/uL — ABNORMAL LOW (ref 3.87–5.11)
RDW: 14.8 % (ref 11.5–15.5)
WBC: 5.5 10*3/uL (ref 4.0–10.5)

## 2017-03-28 MED ORDER — PANTOPRAZOLE SODIUM 40 MG PO PACK
40.0000 mg | PACK | Freq: Every day | ORAL | Status: DC
  Administered 2017-03-28 – 2017-04-03 (×7): 40 mg
  Filled 2017-03-28 (×6): qty 20

## 2017-03-28 MED ORDER — K PHOS MONO-SOD PHOS DI & MONO 155-852-130 MG PO TABS
250.0000 mg | ORAL_TABLET | Freq: Two times a day (BID) | ORAL | Status: DC
  Administered 2017-03-28 – 2017-03-29 (×4): 250 mg via ORAL
  Filled 2017-03-28 (×5): qty 1

## 2017-03-28 MED ORDER — ENOXAPARIN SODIUM 30 MG/0.3ML ~~LOC~~ SOLN: 30.0000 mg | SUBCUTANEOUS | 0 refills | Status: DC

## 2017-03-28 MED ORDER — HYDROCODONE-ACETAMINOPHEN 5-325 MG PO TABS: 1.0000 | ORAL_TABLET | Freq: Four times a day (QID) | ORAL | 0 refills | Status: DC | PRN

## 2017-03-28 MED ORDER — POTASSIUM CHLORIDE CRYS ER 20 MEQ PO TBCR
40.0000 meq | EXTENDED_RELEASE_TABLET | Freq: Once | ORAL | Status: DC
  Filled 2017-03-28: qty 2

## 2017-03-28 NOTE — NC FL2 (Signed)
[place] MEDICAID FL2 LEVEL OF CARE SCREENING TOOL     IDENTIFICATION  Patient Name: Alyssa Savage Birthdate: 08-14-1930 Sex: female Admission Date (Current Location): 03/21/2017  [facility] and [place] Number:  Herbalist and Address:  [facility], [place] [address] [place], [place] [address]      Provider Number: [id number]  Attending Physician Name and Address:  Bonnell Public, MD  Relative Name and Phone Number:       Current Level of Care: Hospital Recommended Level of Care: [place] Prior Approval Number:    Date Approved/Denied:   PASRR Number: [id number]  Discharge Plan: SNF    Current Diagnoses: Patient Active Problem List   Diagnosis Date Noted  . Displaced fracture of left femoral neck ([place]) 03/25/2017  . Protein-calorie malnutrition, severe 03/24/2017  . Fall 03/21/2017  . Fracture of femoral neck, left, closed ([place]) 03/21/2017  . AKI (acute kidney injury) ([place]) 03/21/2017  . Hypertensive emergency 03/21/2017  . Elevated troponin 03/21/2017  . Hypokalemia 03/21/2017  . Stroke ([place]) 03/21/2017  . GERD (gastroesophageal reflux disease)   . Closed fracture of left hip ([place])   . Prolonged Q-T interval on ECG     Orientation RESPIRATION BLADDER Height & Weight     Self, Time, Situation, Place  Normal Incontinent, External catheter(catheter placed 03/25/17) Weight: 108 lb 3.9 oz (49.1 kg) Height:  (uta)  BEHAVIORAL SYMPTOMS/MOOD NEUROLOGICAL BOWEL NUTRITION STATUS      Incontinent Diet(see DC summary)  AMBULATORY STATUS COMMUNICATION OF NEEDS Skin   Extensive Assist Verbally Surgical wounds, Wound Vac(closed left hip, prevena wound vac)                       Personal Care Assistance Level of Assistance  Bathing, Feeding, Dressing Bathing Assistance: Maximum assistance Feeding assistance: Limited assistance Dressing Assistance: Maximum assistance     Functional Limitations Info  Sight,  Hearing, Speech Sight Info: Adequate Hearing Info: Adequate Speech Info: Impaired(slurred/dysarthria)    SPECIAL CARE FACTORS FREQUENCY  PT (By licensed PT), OT (By licensed OT), Speech therapy     PT Frequency: 5x/wk OT Frequency: 5x/wk     Speech Therapy Frequency: 5x/wk      Contractures Contractures Info: Not present    Additional Factors Info  Code Status, Allergies Code Status Info: Full Allergies Info: NKA           Current Medications (03/28/2017):  This is the current hospital active medication list Current Facility-Administered Medications  Medication Dose Route Frequency Provider Last Rate Last Dose  . acetaminophen (TYLENOL) tablet 650 mg  650 mg Oral Q4H PRN Ivor Costa, MD   650 mg at 03/26/17 1738   Or  . acetaminophen (TYLENOL) solution 650 mg  650 mg Per Tube Q4H PRN Ivor Costa, MD       Or  . acetaminophen (TYLENOL) suppository 650 mg  650 mg Rectal Q4H PRN Ivor Costa, MD      . aspirin suppository 300 mg  300 mg Rectal Daily Ivor Costa, MD   300 mg at 03/23/17 1242   Or  . aspirin tablet 325 mg  325 mg Oral Daily Ivor Costa, MD   325 mg at 03/28/17 1036  . atorvastatin (LIPITOR) tablet 40 mg  40 mg Oral q1800 Mary Sella, NP   40 mg at 03/27/17 1839  . carvedilol (COREG) tablet 3.125 mg  3.125 mg Per Tube BID WC Bonnell Public, MD  3.125 mg at 03/28/17 1036  . enoxaparin (LOVENOX) injection 30 mg  30 mg Subcutaneous Q24H Rod Can, MD   30 mg at 03/28/17 1036  . feeding supplement (OSMOLITE 1.2 CAL) liquid 1,000 mL  1,000 mL Per Tube Continuous Lavina Hamman, MD 50 mL/hr at 03/28/17 0600 1,000 mL at 03/28/17 0600  . free water 200 mL  200 mL Per Tube Q6H Dana Allan I, MD   200 mL at 03/28/17 1230  . hydrALAZINE (APRESOLINE) injection 10 mg  10 mg Intravenous Q4H PRN Dana Allan I, MD   10 mg at 03/25/17 1018  . HYDROcodone-acetaminophen (NORCO/VICODIN) 5-325 MG per tablet 1-2 tablet  1-2 tablet Oral Q6H PRN Rod Can,  MD   1 tablet at 03/27/17 1957  . hydrOXYzine (ATARAX/VISTARIL) tablet 10 mg  10 mg Oral TID PRN Ivor Costa, MD      . lactated ringers infusion   Intravenous Continuous Montez Hageman, MD 10 mL/hr at 03/25/17 1103    . menthol-cetylpyridinium (CEPACOL) lozenge 3 mg  1 lozenge Oral PRN Swinteck, Aaron Edelman, MD       Or  . phenol (CHLORASEPTIC) mouth spray 1 spray  1 spray Mouth/Throat PRN Swinteck, Aaron Edelman, MD      . methocarbamol (ROBAXIN) tablet 500 mg  500 mg Oral Q8H PRN Ivor Costa, MD      . morphine 4 MG/ML injection 2 mg  2 mg Intravenous Q4H PRN Ivor Costa, MD   2 mg at 03/23/17 2018  . nitroGLYCERIN (NITROSTAT) SL tablet 0.4 mg  0.4 mg Sublingual Q5 min PRN Ivor Costa, MD      . ondansetron [facility]) tablet 4 mg  4 mg Oral Q6H PRN Swinteck, Aaron Edelman, MD       Or  . ondansetron (ZOFRAN) injection 4 mg  4 mg Intravenous Q6H PRN Swinteck, Aaron Edelman, MD      . pantoprazole (PROTONIX) EC tablet 40 mg  40 mg Oral QHS Dana Allan I, MD   40 mg at 03/27/17 2124  . senna-docusate (Senokot-S) tablet 1 tablet  1 tablet Oral QHS PRN Ivor Costa, MD      . zolpidem (AMBIEN) tablet 5 mg  5 mg Oral QHS PRN Ivor Costa, MD         Discharge Medications: Please see discharge summary for a list of discharge medications.  Relevant Imaging Results:  Relevant Lab Results:   Additional Information SS#: [id number]  Geralynn Ochs, LCSW

## 2017-03-28 NOTE — Progress Notes (Signed)
CSW following for discharge plan. CSW met with patient's daughter at bedside to discuss discharge planning and facility placement. Per patient's daughter, the patient had discussed preferring to go to the facility off of [employer], because she had gone to visit friends there, but the daughter wants to go visit and tour it to make sure that it's a good facility. CSW discussed with the patient's daughter the Medicare coverage for SNF and what she can expect moving forward with discharge. CSW explained barrier at this time is the patient's tube feeds; patient cannot discharge to SNF with an NG tube.   Patient's daughter will follow up with visiting [facility] to determine if she's also agreeable to that facility, and will inform CSW of decision. CSW has faxed referral out.  CSW will continue to follow.  Laveda Abbe, [place] Clinical Social Worker [phone]

## 2017-03-28 NOTE — Plan of Care (Signed)
  Progressing Education: Knowledge of General Education information will improve 03/28/2017 1153 - Progressing by Harlin Heys, RN Health Behavior/Discharge Planning: Ability to manage health-related needs will improve 03/28/2017 1153 - Progressing by Harlin Heys, RN Clinical Measurements: Ability to maintain clinical measurements within normal limits will improve 03/28/2017 1153 - Progressing by Harlin Heys, RN Will remain free from infection 03/28/2017 1153 - Progressing by Harlin Heys, RN Diagnostic test results will improve 03/28/2017 1153 - Progressing by Harlin Heys, RN Respiratory complications will improve 03/28/2017 1153 - Progressing by Harlin Heys, RN Cardiovascular complication will be avoided 03/28/2017 1153 - Progressing by Harlin Heys, RN Activity: Risk for activity intolerance will decrease 03/28/2017 1153 - Progressing by Harlin Heys, RN Nutrition: Adequate nutrition will be maintained 03/28/2017 1153 - Progressing by Harlin Heys, RN Coping: Level of anxiety will decrease 03/28/2017 1153 - Progressing by Harlin Heys, RN Elimination: Will not experience complications related to bowel motility 03/28/2017 1153 - Progressing by Harlin Heys, RN Will not experience complications related to urinary retention 03/28/2017 1153 - Progressing by Harlin Heys, RN Pain Managment: General experience of comfort will improve 03/28/2017 1153 - Progressing by Harlin Heys, RN Safety: Ability to remain free from injury will improve 03/28/2017 1153 - Progressing by Harlin Heys, RN Skin Integrity: Risk for impaired skin integrity will decrease 03/28/2017 1153 - Progressing by Harlin Heys, RN Education: Knowledge of disease or condition will improve 03/28/2017 1153 - Progressing by Harlin Heys, RN Knowledge of secondary prevention will improve 03/28/2017 1153 - Progressing by Harlin Heys, RN Knowledge of patient specific risk  factors addressed and post discharge goals established will improve 03/28/2017 1153 - Progressing by Harlin Heys, RN Coping: Will verbalize positive feelings about self 03/28/2017 1153 - Progressing by Harlin Heys, RN Will identify appropriate support needs 03/28/2017 1153 - Progressing by Harlin Heys, [place] Behavior/Discharge Planning: Ability to manage health-related needs will improve 03/28/2017 1153 - Progressing by Harlin Heys, RN Self-Care: Ability to participate in self-care as condition permits will improve 03/28/2017 1153 - Progressing by Harlin Heys, RN Verbalization of feelings and concerns over difficulty with self-care will improve 03/28/2017 1153 - Progressing by Harlin Heys, RN Ability to communicate needs accurately will improve 03/28/2017 1153 - Progressing by Harlin Heys, RN Nutrition: Risk of aspiration will decrease 03/28/2017 1153 - Progressing by Harlin Heys, RN Dietary intake will improve 03/28/2017 1153 - Progressing by Harlin Heys, RN Ischemic Stroke/TIA Tissue Perfusion: Complications of ischemic stroke/TIA will be minimized 03/28/2017 1153 - Progressing by Harlin Heys, RN

## 2017-03-28 NOTE — Anesthesia Postprocedure Evaluation (Signed)
Anesthesia Post Note  Patient: Alyssa Savage  Procedure(s) Performed: ANTERIOR APPROACH HEMI HIP ARTHROPLASTY (Left Hip)     Patient location during evaluation: PACU Anesthesia Type: MAC and Spinal Level of consciousness: awake and alert Pain management: pain level controlled Vital Signs Assessment: post-procedure vital signs reviewed and stable Respiratory status: spontaneous breathing, nonlabored ventilation, respiratory function stable and patient connected to nasal cannula oxygen Cardiovascular status: stable and blood pressure returned to baseline Postop Assessment: no apparent nausea or vomiting and spinal receding Anesthetic complications: no    Last Vitals:  Vitals:   03/28/17 0036 03/28/17 0407  BP: (!) 170/74 (!) 141/96  Pulse: 79 85  Resp: 20 18  Temp: 37 C 36.8 C  SpO2: 98% 97%    Last Pain:  Vitals:   03/28/17 0407  TempSrc: Oral  PainSc:                  Heidy Mccubbin

## 2017-03-28 NOTE — Progress Notes (Signed)
Physical Therapy Treatment Patient Details Name: Alyssa Savage MRN: [id number] DOB: May 11, 1930 Today's Date: 03/28/2017    History of Present Illness 84 y.o. female with a displaced Left femoral neck fracture and acute embolic left-sided basal ganglia stroke.Marland Kitchen Underwetn Left hip hemiarthroplasty, anterior approach 2/22.     PT Comments    Pt with improved standing tolerance and ability to move L LE. R LE and UE remain to have minimal active contraction limiting ability to use functionally. Pt motivated and able to follow commands to participate. Acute PT to con't to follow.    Follow Up Recommendations  SNF;Supervision/Assistance - 24 hour     Equipment Recommendations       Recommendations for Other Services       Precautions / Restrictions Precautions Precautions: Fall Restrictions Weight Bearing Restrictions: Yes LLE Weight Bearing: Weight bearing as tolerated    Mobility  Bed Mobility Overal bed mobility: Needs Assistance Bed Mobility: Supine to Sit     Supine to sit: Mod assist;+2 for physical assistance     General bed mobility comments: pt able assist with LE movement to EOB, pt pulled up on PT to bring self to EOB  Transfers Overall transfer level: Needs assistance Equipment used: 2 [place] hand held assist Transfers: Sit to/from Stand Sit to Stand: Max assist;+2 physical assistance         General transfer comment: completed 4 sit to stand trials. Pt with strong lean to the Right, with verbal cues pt able to contract buttock and elevate chest to achieve full upright posture. maxA on R side to maintain midline and R hand on stedy  Ambulation/Gait             General Gait Details: unable   Stairs            Wheelchair Mobility    Modified Rankin (Stroke Patients Only) Modified Rankin (Stroke Patients Only) Pre-Morbid Rankin Score: Slight disability Modified Rankin: Severe disability     Balance Overall balance assessment: Needs  assistance Sitting-balance support: Feet supported;Single extremity supported Sitting balance-Leahy Scale: Poor Sitting balance - Comments: R lateral lean, worked on maintaining midline, able to [employer] with min guard x 30 sec before leaning to the R   Standing balance support: Single extremity supported Standing balance-Leahy Scale: Poor                              Cognition Arousal/Alertness: Awake/alert Behavior During Therapy: WFL for tasks assessed/performed Overall Cognitive Status: Impaired/Different from baseline Area of Impairment: Attention;Awareness;Safety/judgement;Problem solving                   Current Attention Level: Sustained     Safety/Judgement: Decreased awareness of deficits Awareness: Emergent Problem Solving: Slow processing;Difficulty sequencing General Comments: however very interactive and improved from initial eval      Exercises General Exercises - Lower Extremity Ankle Circles/Pumps: AROM;AAROM;Right;Left;10 reps Quad Sets: AROM;Both;10 reps;Supine(no noted contraction in R quad) Gluteal Sets: AROM;Both;10 reps;Supine Long Arc Quad: AROM;Left;10 reps;Seated Hip ABduction/ADduction: AAROM;Right;10 reps;Supine    General Comments General comments (skin integrity, edema, etc.): pt with garbled, slurred speech      Pertinent Vitals/Pain Pain Assessment: No/denies pain Faces Pain Scale: Hurts even more Pain Location: L hip Pain Descriptors / Indicators: Aching Pain Intervention(s): Limited activity within patient's tolerance    Home Living  Prior Function            PT Goals (current goals can now be found in the care plan section) Progress towards PT goals: Progressing toward goals    Frequency    Min 4X/week      PT Plan Current plan remains appropriate    Co-evaluation              AM-PAC PT "6 Clicks" Daily Activity  Outcome Measure  Difficulty turning over in bed  (including adjusting bedclothes, sheets and blankets)?: Unable Difficulty moving from lying on back to sitting on the side of the bed? : Unable Difficulty sitting down on and standing up from a chair with arms (e.g., wheelchair, bedside commode, etc,.)?: Unable Help needed moving to and from a bed to chair (including a wheelchair)?: Total Help needed walking in hospital room?: Total Help needed climbing 3-5 steps with a railing? : Total 6 Click Score: 6    End of Session Equipment Utilized During Treatment: Gait belt Activity Tolerance: Patient tolerated treatment well Patient left: in chair;with call bell/phone within reach;with family/visitor present(SLP present) Nurse Communication: Mobility status PT Visit Diagnosis: Unsteadiness on feet (R26.81);Other abnormalities of gait and mobility (R26.89);Muscle weakness (generalized) (M62.81);Pain;Hemiplegia and hemiparesis;Difficulty in walking, not elsewhere classified (R26.2);Other symptoms and signs involving the nervous system (R29.898) Hemiplegia - Right/Left: Right Hemiplegia - dominant/non-dominant: Dominant Hemiplegia - caused by: Cerebral infarction Pain - Right/Left: Left Pain - part of body: Hip     Time: 1116-1140 PT Time Calculation (min) (ACUTE ONLY): 24 min  Charges:  $Therapeutic Exercise: 8-22 mins $Therapeutic Activity: 8-22 mins                    G Codes:       Kittie Plater, PT, DPT Pager #: [phone] Office #: [phone]    [place] 03/28/2017, 1:22 PM

## 2017-03-28 NOTE — Progress Notes (Signed)
Nutrition Follow-up  DOCUMENTATION CODES:   Severe malnutrition in context of chronic illness  INTERVENTION:   - Continue TF of Osmolite 1.2 @ 50 mL/hr.  TF provides 1440 kcal (101% estimated energy needs), 67 grams protein (100% estimated protein needs), and 985 mL free water.  - Continue free water flushes per MD.  NUTRITION DIAGNOSIS:   Severe Malnutrition related to chronic illness as evidenced by severe muscle depletion, severe fat depletion.  Ongoing  GOAL:   Patient will meet greater than or equal to 90% of their needs  Met with current TF  MONITOR:   I & O's, Skin, TF tolerance, Vent status, Diet advancement, Labs  REASON FOR ASSESSMENT:   Consult Enteral/tube feeding initiation and management  ASSESSMENT:   Pt. with no known PMH; admitted on 03/21/2017, presented with complaint of fall, was found to have acute stroke as well as left hip fracture and AKI.  03/23/17 - Cortrak placed, gastric 03/24/17 - Osmolite 1.2 started @ 10 mL/hr with recommendation to advance by 10 mL/hr every 12 hrs to goal rate of 50 mL/hr 03/25/17 - s/p L hip hemiarthroplasty 03/26/17 - started free water flushes through Cortrak of 200 mL QID  Noted SLP saw pt today and recommended MBS. Per MD note, if dysphagia continues and pt continues to fail MBS, consult to GI for possible PEG tube placement likely.  RD talked with pt at bedside. Osmolite 1.2 infusing through Cortrak @ 50 mL/hr at time of visit. Free water flushes of 200 mL QID programmed in Kangaroo pump. Pt states she is doing well and tolerating TF.  Noted low potassium and phosphorus labs drawn on 03/27/17. Spoke with RN who reported no known plans to replete. Noted that Dr. Marthenia Rolling monitoring hypokalemia and hypomagnesemia and repleting. IV magnesium 2 grams provided 03/26/17. Paged Dr. Marthenia Rolling to discuss low potassium and phosphorus from labs on 03/27/17 and find out if plans to replete. Unable to get in touch with Dr. Marthenia Rolling.  I/O's:  +10 L since admission  Medications reviewed and include: 40 mg Protonix daily  Labs reviewed: potassium 3.4, CO2 20, BUN 30, creatinine 1.55, calcium 7.8, phosphorus 2.2, magnesium 1.9 CBG's: 131, 181, 147, 154 on 03/28/17  Diet Order:  Fall precautions  EDUCATION NEEDS:   Not appropriate for education at this time  Skin:  Skin Assessment: Skin Integrity Issues: Skin Integrity Issues:: Incisions Incisions: closed incision to L hip  Last BM:  03/25/17 small type 6  Height:   Ht Readings from Last 1 Encounters:  03/25/17 5' (1.524 m)    Weight:   Wt Readings from Last 1 Encounters:  03/28/17 108 lb 3.9 oz (49.1 kg)    Ideal Body Weight:  45.45 kg  BMI:  Body mass index is 21.14 kg/m.  Estimated Nutritional Needs:   Kcal:  4010-2725 calories  Protein:  61-70 grams (1.5-1.7g/kg)  Fluid:  >1.5L    Gaynell Face, MS, RD, LDN Pager: [phone] Weekend/After Hours: [phone]

## 2017-03-28 NOTE — Progress Notes (Signed)
    Subjective:  Patient reports pain as mild.  Denies N/V/CP/SOB. Failed swallow eval - has NG tube in place.  Objective:   VITALS:   Vitals:   03/27/17 2053 03/28/17 0036 03/28/17 0407 03/28/17 0907  BP: (!) 147/91 (!) 170/74 (!) 141/96 (!) 138/92  Pulse: 77 79 85 86  Resp: 20 20 18 16   Temp: 98.9 F (37.2 C) 98.6 F (37 C) 98.2 F (36.8 C) 99.1 F (37.3 C)  TempSrc: Oral Oral Oral Oral  SpO2: 98% 98% 97% 99%  Weight:   49.1 kg (108 lb 3.9 oz)   Height:        NAD ABD soft Sensation intact distally Intact pulses distally Dorsiflexion/Plantar flexion intact Incision: dressing C/D/I Compartment soft   Lab Results  Component Value Date   WBC 5.5 03/28/2017   HGB 9.6 (L) 03/28/2017   HCT 29.9 (L) 03/28/2017   MCV 84.9 03/28/2017   PLT 237 03/28/2017   BMET    Component Value Date/Time   NA 139 03/27/2017 0429   K 3.4 (L) 03/27/2017 0429   CL 109 03/27/2017 0429   CO2 20 (L) 03/27/2017 0429   GLUCOSE 178 (H) 03/27/2017 0429   BUN 30 (H) 03/27/2017 0429   CREATININE 1.55 (H) 03/27/2017 0429   CALCIUM 7.8 (L) 03/27/2017 0429   GFRNONAA 30 (L) 03/27/2017 0429   GFRAA 34 (L) 03/27/2017 0429     Assessment/Plan: 3 Days Post-Op   Principal Problem:   Stroke [facility]) Active Problems:   Fall   Fracture of femoral neck, left, closed ([place])   AKI (acute kidney injury) ([place])   Hypertensive emergency   Elevated troponin   Hypokalemia   GERD (gastroesophageal reflux disease)   Protein-calorie malnutrition, severe   Displaced fracture of left femoral neck ([facility])   WBAT with walker DVT ppx: lovenox, SCDs, TEDS PO pain control PT/OT Cont Prevena Dispo: D/C planning, will need to convert VAC to portable prevena unit upon d/c    Hilton Cork Maddie Brazier 03/28/2017, 10:08 AM   Rod Can, MD Cell [phone]

## 2017-03-28 NOTE — Progress Notes (Signed)
[facility] Progress Note  Patient: Alyssa Savage YQM:[id number]   PCP: Patient, No Pcp Per DOB: Aug 01, 1930   DOA: 03/21/2017   DOS: 03/28/2017   Date of Service: the patient was seen and examined on 03/28/2017  Subjective: Patient seen today alongside patient's daughter.  No new complaints.  Orthopedic input is highly appreciated.  No chest pain, no shortness of breath, no fever or chills.  Blood pressure is optimize.  The patient is still on tube feeds via the NG tube.  Speech therapy saw the patient today, 03/28/2017, and repeat modified barium swallow recommended and ordered.  Continues to recommend n.p.o for now.  We will continue physical therapy input.  Pursue disposition.  This is postop day 3 for left hip hemiarthroplasty.    Brief hospital course: Pt. with no known PMH ; admitted on 03/21/2017, presented with complaint of fall, was found to have acute stroke as well as left hip fracture.  Neurology was consulted, stroke workup completed.  Hypokalemia corrected.  Orthopedic also consulted and patient underwent left hip hemiarthroplasty on 03/25/2017.  Postop and post CVA case in progress.  Assessment and Plan: Acute small vessel left basal ganglia stroke [facility]):   Head CT was performed which shows right basal ganglia stroke MRI brain shows 2 cm acute infarction in posterior left basal ganglia as well as posterior left internal capsule radiating to white matter tract. No mass or hemorrhage. Chronic small vessel disease, no significant large vessel disease on MRI. Blood pressure significantly elevated but will allow permissive HTN in the setting of acute stroke, and ideally will start controlling blood pressure only postoperatively. ASA and lipitor LDL 117, hemoglobin A1c 5.9. 2D transthoracic echocardiography 6065% EF, no acute abnormality. PT/OT consult when able to SLP recommends core track placement.  Consulted. 03/25/2017: Neurology input is highly appreciated.  We will  continue further management as per the neurology team. 03/26/2017: Discontinue IV fluids.  Continue tube feeds.  Repeat swallowing evaluation in the next 2-3 days.  Consider PEG tube placement if patient continues to feel swallowing evaluation.  Will start patient on free water via the NG tube. 03/28/2017: Patient seen by speech therapy today and patient will undergo repeat modified barium swallowing.  She will remain n.p.o. for now.  Further management will depend on the results of the modified barium swallowing.    Fall and fracture of femoral neck, left, closed [facility]): Orthopedic surgeon, Dr.Swinteck was consulted. - Pain control: morphine prn and percocet - When necessary Zofran for nausea - Robaxin for muscle spasm -Biggest issue is medical clearance.   Acceptable risk from a cardiological perspective, high risk from neurological perspective for worsening of her stroke. Discussed with orthopedic as well as daughter, they are willing to accept the risk. Avoid hypotension during procedure.  As well as postoperative. 03/25/2017: Will optimize patient's blood pressure.  For surgery later today. 03/26/2017: This is postop day 1 for left hip hemiarthroplasty.  No pain.  Patient remains stable.  Likely acute kidney injury on chronic kidney disease stage III versus acute kidney injury: -Creatinine is down to 1.41 today.  -Likely, patient has underlying hypertension.  -Continue to monitor. -03/27/2017: Serum creatinine today is 1.55.  Hypertensive emergency:  -prn hydralazine for SBP>220 -As mentioned above, the patient likely has Hypertension.  Low potassium is also noted.  There may be need to rule out hyperaldo and renal artery stenosis when patient is more stable. -We will check plasma aldosterone concentration and plasma renin activity. -Will consider workup for possible  renal artery stenosis if above is non-revealing. -We will continue to optimize for now. -Further management will depend on  hospital course. -03/26/2017: The blood pressure today is 146/81 mmHg.  Blood pressure control is improving.  Continue to monitor closely. -03/28/2017: Patient's blood pressure is optimize.  Elevated troponin: trop 0.10. No CP. Likely due to demand ischemia secondary to hypertension and acute stroke. - prn Nitroglycerin, Morphine, and aspirin, lipitor  - 2d echo shows no acute abnormality, preserved EF. Cardiology consult appreciated.  Hypokalemia: K= 2.0  on admission. Aggressively replaced.  Monitor. -03/25/2017: Please see above (hypertensive in emergency) 03/26/2017: Potassium today is 3.6.  Continue to monitor.  Magnesium is 1.4. 03/28/2017: Continue to monitor and replete.  Hypomagnesemia: -Magnesium today, 03/26/2017 is 1.4. -We will give IV magnesium 2 g.  We will continue to monitor magnesium level. -03/27/2017: Magnesium today is 1.9.  GERD (gastroesophageal reflux disease) -protonix  Dysphagia. Patient sustained dysphagia post stroke. Unable to pass MBS. Cor Trac placed. Started on tube feedings. Consider repeat swallowing evaluation in 2-3 days.  PEG tube remains an option if dysphagia continues. 03/27/2017: We will consider swallowing evaluation again in the morning.  Diet: N.p.o., on cor Trac with tube feeds. DVT Prophylaxis: subcutaneous Heparin Advance goals of care discussion: Full code  Family Communication: family was present at bedside, at the time of interview.  All questions answered.  Disposition:  Skilled nursing facility.  Consultants: Neurology, orthopedics, cardiology Procedures: Echocardiogram  Antibiotics: Anti-infectives (From admission, onward)   Start     Dose/Rate Route Frequency Ordered Stop   03/26/17 0600  ceFAZolin (ANCEF) IVPB 2g/100 mL premix     2 g 200 mL/hr over 30 Minutes Intravenous To Surgery 03/25/17 1612 03/27/17 0600   03/26/17 0600  ceFAZolin (ANCEF) IVPB 2g/100 mL premix  Status:  Discontinued     2 g 200 mL/hr over 30  Minutes Intravenous On call to O.R. 03/25/17 1612 03/25/17 1616   03/25/17 1615  ceFAZolin (ANCEF) IVPB 2g/100 mL premix     2 g 200 mL/hr over 30 Minutes Intravenous Every 6 hours 03/25/17 1612 03/25/17 2230       Objective: Physical Exam: Vitals:   03/27/17 1833 03/27/17 2053 03/28/17 0036 03/28/17 0407  BP: (!) 160/98 (!) 147/91 (!) 170/74 (!) 141/96  Pulse: 91 77 79 85  Resp: 20 20 20 18   Temp: 98.7 F (37.1 C) 98.9 F (37.2 C) 98.6 F (37 C) 98.2 F (36.8 C)  TempSrc: Oral Oral Oral Oral  SpO2: 95% 98% 98% 97%  Weight:    49.1 kg (108 lb 3.9 oz)  Height:        Intake/Output Summary (Last 24 hours) at 03/28/2017 0817 Last data filed at 03/28/2017 8295 Gross per 24 hour  Intake 1001.67 ml  Output 750 ml  Net 251.67 ml   Filed Weights   03/26/17 0500 03/27/17 0200 03/28/17 0407  Weight: 47.5 kg (104 lb 11.2 oz) 47.3 kg (104 lb 4.4 oz) 49.1 kg (108 lb 3.9 oz)   General: Alert, Awake and Oriented to Time, Place and [place]. Appear in mild distress, affect appropriate Eyes: PERRL, Conjunctiva normal ENT: Oral Mucosa clear moist. Neck: no JVD, no Abnormal Mass Or lumps Cardiovascular: S1 and S2 Present, no Murmur, Peripheral Pulses Present Respiratory: normal respiratory effort, Bilateral Air entry equal and Decreased, no use of accessory muscle, Clear to Auscultation, no Crackles, no wheezes Abdomen: Bowel Sound present, Soft and no tenderness, no hernia Skin: no redness, no Rash, no induration  Extremities: no Pedal edema, no calf tenderness Neurologic: Dysarthric.  Right-sided weakness.  Data Reviewed: CBC: Recent Labs  Lab 03/21/17 1950  03/24/17 0414 03/25/17 0654 03/26/17 0215 03/27/17 0429 03/28/17 0314  WBC 8.6   < > 8.8 10.4 7.6 6.4 5.5  NEUTROABS 7.4  --   --  9.0*  --   --   --   HGB 12.1   < > 10.5* 9.9* 10.7* 9.9* 9.6*  HCT 37.3   < > 32.6* 30.7* 32.6* 30.6* 29.9*  MCV 87.6   < > 86.2 86.7 83.8 84.5 84.9  PLT 427*   < > 296 243 211 221 237   < >  = values in this interval not displayed.   Basic Metabolic Panel: Recent Labs  Lab 03/22/17 0345  03/23/17 0524 03/24/17 0414 03/25/17 0654 03/26/17 0215 03/27/17 0429  NA 150*   < > 146* 144 146* 140 139  K 2.1*   < > 3.0* 3.3* 3.4* 3.6 3.4*  CL 101   < > 107 108 112* 109 109  CO2 26   < > 25 25 23  21* 20*  GLUCOSE 104*   < > 132* 149* 150* 170* 178*  BUN 29*   < > 25* 22* 23* 25* 30*  CREATININE 1.57*   < > 1.51* 1.45* 1.41* 1.41* 1.55*  CALCIUM 8.9   < > 8.7* 8.7* 8.4* 7.8* 7.8*  MG 2.2  --  1.8  --  1.6* 1.4* 1.9  PHOS  --   --   --   --  2.1* 2.4* 2.2*   < > = values in this interval not displayed.    Liver Function Tests: Recent Labs  Lab 03/21/17 1950 03/25/17 0654  AST 36 45*  ALT 18 30  ALKPHOS 85 73  BILITOT 1.0 1.3*  PROT 8.2* 6.1*  ALBUMIN 3.4* 2.2*   No results for input(s): LIPASE, AMYLASE in the last 168 hours. No results for input(s): AMMONIA in the last 168 hours. Coagulation Profile: Recent Labs  Lab 03/21/17 1950  INR 0.99   Cardiac Enzymes: Recent Labs  Lab 03/22/17 0117 03/22/17 0345 03/22/17 1418  TROPONINI 0.12* 0.12* 0.12*   BNP (last 3 results) No results for input(s): PROBNP in the last 8760 hours. CBG: Recent Labs  Lab 03/27/17 0831 03/27/17 1224 03/27/17 1955 03/28/17 0039 03/28/17 0438  GLUCAP 164* 150* 144* 154* 147*   Studies: No results found.  Scheduled Meds: . aspirin  300 mg Rectal Daily   Or  . aspirin  325 mg Oral Daily  . atorvastatin  40 mg Oral q1800  . carvedilol  3.125 mg Per Tube BID WC  . enoxaparin (LOVENOX) injection  30 mg Subcutaneous Q24H  . free water  200 mL Per Tube Q6H  . pantoprazole  40 mg Oral QHS   Continuous Infusions: . feeding supplement (OSMOLITE 1.2 CAL) 1,000 mL (03/28/17 0600)  . lactated ringers 10 mL/hr at 03/25/17 1103   PRN Meds: acetaminophen **OR** acetaminophen (TYLENOL) oral liquid 160 mg/5 mL **OR** acetaminophen, hydrALAZINE, HYDROcodone-acetaminophen, hydrOXYzine,  menthol-cetylpyridinium **OR** phenol, methocarbamol, morphine injection, nitroGLYCERIN, ondansetron **OR** ondansetron (ZOFRAN) IV, senna-docusate, zolpidem  Time spent: 25 minutes  Author: Dana Allan, MD  [facility] Pager: [phone].  03/28/2017 8:17 AM  If 7PM-7AM, please contact night-coverage at [email or web address], password [facility]

## 2017-03-28 NOTE — Care Management Important Message (Signed)
Important Message  Patient Details  Name: Alyssa Savage MRN: [id number] Date of Birth: 04-14-1930   Medicare Important Message Given:  Yes    Orbie Pyo 03/28/2017, 1:29 PM

## 2017-03-28 NOTE — Evaluation (Signed)
Clinical/Bedside Swallow Evaluation Patient Details  Name: Alyssa Savage MRN: [id number] Date of Birth: April 07, 1930  Today's Date: 03/28/2017 Time: SLP Start Time (ACUTE ONLY): 1115 SLP Stop Time (ACUTE ONLY): 1130 SLP Time Calculation (min) (ACUTE ONLY): 15 min  Past Medical History:  Past Medical History:  Diagnosis Date  . Femoral neck fracture ([place]) 03/2017   left   . GERD (gastroesophageal reflux disease)   . Hypertensive emergency 03/2017  . Hypokalemia 03/2017  . Stroke [facility])    Past Surgical History:  Past Surgical History:  Procedure Laterality Date  . NO PAST SURGERIES     HPI:  84 y.o.femalewho notes that she has had significant slurred speech since 03/21/17. Head CT was performed which shows right basal ganglia infarct; MRI shows Acute infarction in the posteriorLEFTbasal ganglia, posterior limb internal capsule and radiating white matter tracts.  Recent fall with left hip fracture.    Assessment / Plan / Recommendation Clinical Impression  Patient was up in chair having just finished PT. Observed patient follow commands with PT. Presented ice chips by spoon and patient had poor bolus control, clearing of throat and multiple swallows. Pt was able to control bolus with honey thickened liquids and puree. It appears that patients swallow has improved since prior MBS on 2/20. Would recommend a repeat MBS for diet recommendations. Communication and education with family member in room.  SLP Visit Diagnosis: Dysphagia, oropharyngeal phase (R13.12)    Aspiration Risk  Moderate aspiration risk    Diet Recommendation NPO        Other  Recommendations Oral Care Recommendations: Oral care QID   Follow up Recommendations Inpatient Rehab                  Prognosis Prognosis for Safe Diet Advancement: Fair      Swallow Study   General Date of Onset: 03/21/17 HPI: 84 y.o.femalewho notes that she has had significant slurred speech since 03/21/17. Head CT was  performed which shows right basal ganglia infarct; MRI shows Acute infarction in the posteriorLEFTbasal ganglia, posterior limb internal capsule and radiating white matter tracts.  Recent fall with left hip fracture.  Type of Study: Bedside Swallow Evaluation Previous Swallow Assessment: MBS on 03/23/17 Diet Prior to this Study: NPO;NG Tube Temperature Spikes Noted: No Respiratory Status: Room air History of Recent Intubation: No Behavior/Cognition: Alert;Cooperative Oral Cavity Assessment: Excessive secretions Oral Care Completed by SLP: Yes Oral Cavity - Dentition: Edentulous Vision: Functional for self-feeding Self-Feeding Abilities: Needs assist Patient Positioning: Upright in chair Baseline Vocal Quality: Normal Volitional Cough: Strong Volitional Swallow: Able to elicit    Oral/Motor/Sensory Function Overall Oral Motor/Sensory Function: Moderate impairment Facial ROM: Reduced right;Suspected CN VII (facial) dysfunction Facial Symmetry: Abnormal symmetry right;Suspected CN VII (facial) dysfunction Facial Strength: Reduced right;Suspected CN VII (facial) dysfunction Facial Sensation: Reduced right;Suspected CN V (Trigeminal) dysfunction Lingual ROM: Reduced right;Suspected CN XII (hypoglossal) dysfunction Lingual Symmetry: Abnormal symmetry right;Suspected CN XII (hypoglossal) dysfunction Lingual Strength: Reduced;Suspected CN XII (hypoglossal) dysfunction Mandible: Within Functional Limits   Ice Chips Ice chips: Impaired Presentation: Spoon Oral Phase Impairments: Reduced labial seal;Reduced lingual movement/coordination;Impaired mastication;Poor awareness of bolus Oral Phase Functional Implications: Prolonged oral transit;Oral holding Pharyngeal Phase Impairments: Multiple swallows   Thin Liquid Thin Liquid: Not tested    Nectar Thick Nectar Thick Liquid: Not tested   Honey Thick Honey Thick Liquid: Within functional limits Presentation: Spoon   Puree Puree: Within  functional limits   Solid   GO  Charlynne Cousins Tatisha Cerino, MA, CCC-SLP 03/28/2017 1:06 PM

## 2017-03-28 NOTE — Care Management Note (Signed)
Case Management Note  Patient Details  Name: Alyssa Savage MRN: [id number] Date of Birth: 12/23/1930  Subjective/Objective:                    Action/Plan: Plan is for SNF when medically stable. CM following.   Expected Discharge Date:  03/31/17               Expected Discharge Plan:     In-House Referral:     Discharge planning Services     Post Acute Care Choice:    Choice offered to:     DME Arranged:    DME Agency:     HH Arranged:    HH Agency:     Status of Service:  In process, will continue to follow  If discussed at Long Length of Stay Meetings, dates discussed:    Additional Comments:  [name], RN 03/28/2017, 12:24 PM

## 2017-03-29 ENCOUNTER — Inpatient Hospital Stay (HOSPITAL_COMMUNITY): Payer: Medicare Other

## 2017-03-29 LAB — COMPREHENSIVE METABOLIC PANEL
ALK PHOS: 184 U/L — AB (ref 38–126)
ALT: 115 U/L — ABNORMAL HIGH (ref 14–54)
ANION GAP: 10 (ref 5–15)
AST: 236 U/L — ABNORMAL HIGH (ref 15–41)
Albumin: 1.9 g/dL — ABNORMAL LOW (ref 3.5–5.0)
BILIRUBIN TOTAL: 0.6 mg/dL (ref 0.3–1.2)
BUN: 27 mg/dL — ABNORMAL HIGH (ref 6–20)
CALCIUM: 8.4 mg/dL — AB (ref 8.9–10.3)
CO2: 24 mmol/L (ref 22–32)
Chloride: 109 mmol/L (ref 101–111)
Creatinine, Ser: 1.18 mg/dL — ABNORMAL HIGH (ref 0.44–1.00)
GFR, EST AFRICAN AMERICAN: 48 mL/min — AB (ref >60)
GFR, EST NON AFRICAN AMERICAN: 41 mL/min — AB (ref >60)
Glucose, Bld: 122 mg/dL — ABNORMAL HIGH (ref 65–99)
Potassium: 3.7 mmol/L (ref 3.5–5.1)
Sodium: 143 mmol/L (ref 135–145)
TOTAL PROTEIN: 6.2 g/dL — AB (ref 6.5–8.1)

## 2017-03-29 LAB — GLUCOSE, CAPILLARY
GLUCOSE-CAPILLARY: 121 mg/dL — AB (ref 65–99)
Glucose-Capillary: 101 mg/dL — ABNORMAL HIGH (ref 65–99)
Glucose-Capillary: 120 mg/dL — ABNORMAL HIGH (ref 65–99)
Glucose-Capillary: 147 mg/dL — ABNORMAL HIGH (ref 65–99)
Glucose-Capillary: 172 mg/dL — ABNORMAL HIGH (ref 65–99)

## 2017-03-29 LAB — MAGNESIUM: Magnesium: 1.7 mg/dL (ref 1.7–2.4)

## 2017-03-29 MED ORDER — POTASSIUM CHLORIDE IN NACL 40-0.9 MEQ/L-% IV SOLN
INTRAVENOUS | Status: AC
  Administered 2017-03-29: 75 mL/h via INTRAVENOUS
  Filled 2017-03-29: qty 1000

## 2017-03-29 MED ORDER — BISACODYL 10 MG RE SUPP
10.0000 mg | Freq: Once | RECTAL | Status: AC
  Administered 2017-03-29: 10 mg via RECTAL
  Filled 2017-03-29: qty 1

## 2017-03-29 NOTE — Progress Notes (Signed)
CSW met with patient's daughter and provided bed offers. CSW encouraged patient's daughter to research and visit facilities soon so that we can make a decision on placement.  CSW will continue to follow.  Laveda Abbe, [place] Clinical Social Worker [phone]

## 2017-03-29 NOTE — Progress Notes (Signed)
RN called to room to check feeding pump by daughter; states pump making a loud noise and noise started earlier today 03/28/17 on 1st shift; no unusual noise noted; called portable equipment for another pump; no alarms noted on pump.

## 2017-03-29 NOTE — Progress Notes (Signed)
Occupational Therapy Treatment Patient Details Name: Alyssa Savage MRN: [id number] DOB: 1930/07/07 Today's Date: 03/29/2017    History of present illness 84 y.o. female with a displaced Left femoral neck fracture and acute embolic left-sided basal ganglia stroke.Marland Kitchen Underwetn Left hip hemiarthroplasty, anterior approach 2/22.    OT comments  Pt progressing towards established OT goals. Pt performing grooming tasks and requiring Mod A for bilateral coordination and incorporation of right (dominant) hand. Pt performing self ROM (shoulder flex/ext) with Min A for hand positioning. Educated pt on edema management and RUE positioning; verbalized understanding. Continue to recommend dc to SNF and will continue to follow acutely.   Follow Up Recommendations  SNF;Supervision/Assistance - 24 hour    Equipment Recommendations  Other (comment)(TBA at SNF)    Recommendations for Other Services      Precautions / Restrictions Precautions Precautions: Fall Precaution Comments: L ant hip wound vac Restrictions Weight Bearing Restrictions: Yes LLE Weight Bearing: Weight bearing as tolerated Other Position/Activity Restrictions: L hip wound vac       Mobility Bed Mobility            General bed mobility comments: Pt in recliner upon arrival  Transfers              Balance Overall balance assessment: Needs assistance Sitting-balance support: Feet supported;Single extremity supported Sitting balance-Leahy Scale: Poor Sitting balance - Comments: R lateral lean, worked on maintaining midline, able to [employer] with min guard x 30 sec before leaning to the R Postural control: Right lateral lean;Posterior lean Standing balance support: Single extremity supported Standing balance-Leahy Scale: Poor                             ADL either performed or assessed with clinical judgement   ADL Overall ADL's : Needs assistance/impaired Eating/Feeding: NPO   Grooming: Oral  care;Moderate assistance;Sitting;Wash/dry hands Grooming Details (indicate cue type and reason): Requiring Mod overall for oral care. Pt sitting in recliner for optimal positioning. Pt performing oral care with Max hand over hand assistance to incorporate RUE and hold cup/tooth paste with right hand. Pt performing bilateral tasks to rub lotion on right hand; RUE placed on table.                              Functional mobility during ADLs: (used stedy) General ADL Comments: Pt performing oral care, applying lotion, cleaning up task (to increase weight bearing), and excercises. Pt highly motivated to participate in therapy     Vision       Perception     Praxis      Cognition Arousal/Alertness: Awake/alert Behavior During Therapy: WFL for tasks assessed/performed Overall Cognitive Status: Impaired/Different from baseline Area of Impairment: Safety/judgement;Problem solving                   Current Attention Level: Sustained     Safety/Judgement: Decreased awareness of deficits Awareness: Emergent Problem Solving: Slow processing;Difficulty sequencing General Comments: Pt motivated to participate in therapy. requiring increased time and cues to follow commands. Decreased problem solving and benefits from quite environement for attention        Exercises Exercises: General Upper Extremity;Other exercises General Exercises - Upper Extremity Shoulder Flexion: Self ROM;Right;10 reps;Seated Shoulder Extension: Self ROM;Right;10 reps;Seated  Other Exercises Other Exercises: Education on emdema management. Other Exercises: Cleaning activity with BUEs to increased ROM and wbing through RUE.  Shoulder Instructions       General Comments Daughter present during session    Pertinent Vitals/ Pain       Pain Assessment: Faces Faces Pain Scale: Hurts even more Pain Location: L hip Pain Descriptors / Indicators: Aching(with movement, minimal pain at  rest) Pain Intervention(s): Monitored during session;Repositioned  Home Living                                          Prior Functioning/Environment              Frequency  Min 2X/week        Progress Toward Goals  OT Goals(current goals can now be found in the care plan section)  Progress towards OT goals: Progressing toward goals  Acute Rehab OT Goals Patient Stated Goal: for [place] to speak to her daughter regarding discharge recs OT Goal Formulation: With patient/family Time For Goal Achievement: 04/09/17 Potential to Achieve Goals: Good ADL Goals Pt Will Perform Grooming: with set-up;with supervision;sitting Pt Will Perform Upper Body Bathing: with min assist;sitting Pt Will Perform Lower Body Bathing: with min assist;sitting/lateral leans;bed level Pt Will Transfer to Toilet: with mod assist;bedside commode;squat pivot transfer  Plan Discharge plan remains appropriate    Co-evaluation                 AM-PAC PT "6 Clicks" Daily Activity     Outcome Measure   Help from another [place] eating meals?: Total Help from another [place] taking care of personal grooming?: A Lot Help from another [place] toileting, which includes using toliet, bedpan, or urinal?: A Lot Help from another [place] bathing (including washing, rinsing, drying)?: A Lot Help from another [place] to put on and taking off regular upper body clothing?: A Lot Help from another [place] to put on and taking off regular lower body clothing?: A Lot 6 Click Score: 11    End of Session    OT Visit Diagnosis: Other abnormalities of gait and mobility (R26.89);Muscle weakness (generalized) (M62.81);Other symptoms and signs involving cognitive function;Pain;Hemiplegia and hemiparesis Hemiplegia - Right/Left: Right Hemiplegia - dominant/non-dominant: Dominant Hemiplegia - caused by: Cerebral infarction Pain - Right/Left: Left Pain - part of body: Hip   Activity Tolerance Patient  tolerated treatment well   Patient Left in chair;with call bell/phone within reach;with family/visitor present   Nurse Communication Mobility status;Need for lift equipment(stedy +2 needed)        Time: 1093-2355 OT Time Calculation (min): 29 min  Charges: OT General Charges $OT Visit: 1 Visit OT Treatments $Self Care/Home Management : 8-22 mins $Therapeutic Activity: 8-22 mins  [place], OTR/L Acute Rehab Pager: [phone] Office: [place] 03/29/2017, 1:24 PM

## 2017-03-29 NOTE — Progress Notes (Signed)
Feeding pump switched out and new tubing ; daughter states pump still making noise; pt.'s been sleeping during shift.

## 2017-03-29 NOTE — Progress Notes (Addendum)
[facility] Progress Note  Patient: Alyssa Savage YTK:[id number]   PCP: Patient, No Pcp Per DOB: 1930/04/11   DOA: 03/21/2017   DOS: 03/29/2017   Date of Service: the patient was seen and examined on 03/29/2017  Subjective: Pending clearance from speech therapy, repeat modified barium swallow 2/26 pending Hemodynamically stable overnight. Postop day 4 of hip arthroplasty. Plan is to go to SNF once swallowing issues resolve Denies chest pain, no shortness of breath, no fever or chills.         Brief hospital course: Pt. with no known PMH ; admitted on 03/21/2017, presented with complaint of fall, was found to have acute stroke as well as left hip fracture.  Neurology was consulted, stroke workup completed.  Hypokalemia corrected.  Orthopedic also consulted and patient underwent left hip hemiarthroplasty on 03/25/2017.  Postop and post CVA case in progress.  Assessment and Plan: Acute small vessel left basal ganglia stroke [facility]):   Head CT was performed which shows right basal ganglia stroke  MRI reveals:1.5-2 cm Acute infarction in the posterior LEFT basal ganglia, posterior limb internal capsule and radiating white matter tracts. Nightly small vessel disease with right-sided deficits which are improving, risk factors include diabetes mellitus, hyperlipidemia and hypertension Completed permissive HTN in the setting of acute stroke,  ASA and lipitor LDL 117, hemoglobin A1c 5.9. 2D transthoracic echocardiography 60-65% EF, no acute abnormality. Carotid Doppler? Do not see results will perform PT/OT  -will need SNF SLP currently core track , repeat modified barium swallow today Neurology recommends 325 mg of aspirin Consider 30 day cardiac event monitor at discharge, will arrange to rule out atrial fibrillation recommended by Dr. Leonie Man Patient may need PEG tube placement if she for fails modified barium swallow today.   Follow up with [facility] in 6 weeks   Fall  and fracture of femoral neck, left, closed [facility]): Orthopedic surgeon, Dr.Swinteck was consulted. - Pain control: morphine prn and percocet - When necessary Zofran for nausea - Robaxin for muscle spasm acceptable risk from a cardiological perspective, high risk from neurological perspective for worsening of her stroke. Discussed with orthopedic as well as daughter, they are willing to accept the risk. Avoid hypotension during procedure.  As well as postoperative. She is status post  left hip hemiarthroplasty, day #4.  Remains stable will need to convert VAC to portable prevena unit upon d/c as per Dr Hilton Cork Swinteck    Likely acute kidney injury on chronic kidney disease stage III versus acute kidney injury:  Creatinine peaked at 1.71, has ranged between 1.4-1.57, repeat -Continue to monitor. -03/27/2017: Serum creatinine today is 1.55.  Hypertensive emergency:  -prn hydralazine for SBP>220   may be need further outpatient workup for secondary hypertension including hyperaldosteronism/  renal artery stenosis   -We will continue to optimize for now. Continue Coreg,prn hydralazine    Elevated troponin: trop 0.10. No CP. Likely due to demand ischemia secondary to hypertension and acute stroke. Patient received preop clearance on 2/20 by cardiology - prn Nitroglycerin, Morphine, and aspirin, lipitor  - 2d echo shows no acute abnormality, preserved EF.  Troponins were mildly elevated during this admission without EKG changes   Hypokalemia: K= 2.0  on admission.Now  repleted    Hypomagnesemia: -Magnesium today, 03/26/2017 is 1.4.  Recheck  GERD (gastroesophageal reflux disease) -protonix  Dysphagia. Patient sustained dysphagia post stroke. Unable to pass MBS. Cor Trac placed. Started on tube feedings. Consider repeat swallowing evaluation   PEG tube remains an  option if dysphagia continues.    Diet: N.p.o., on cor Trac with tube feeds. DVT Prophylaxis: subcutaneous  Heparin Advance goals of care discussion: Full code  Family Communication: family was present at bedside, at the time of interview.  All questions answered.  Disposition:  Skilled nursing facility once feeding issues resolve.  Consultants: Neurology, orthopedics, cardiology Procedures: Echocardiogram  Antibiotics: Anti-infectives (From admission, onward)   Start     Dose/Rate Route Frequency Ordered Stop   03/26/17 0600  ceFAZolin (ANCEF) IVPB 2g/100 mL premix     2 g 200 mL/hr over 30 Minutes Intravenous To Surgery 03/25/17 1612 03/27/17 0600   03/26/17 0600  ceFAZolin (ANCEF) IVPB 2g/100 mL premix  Status:  Discontinued     2 g 200 mL/hr over 30 Minutes Intravenous On call to O.R. 03/25/17 1612 03/25/17 1616   03/25/17 1615  ceFAZolin (ANCEF) IVPB 2g/100 mL premix     2 g 200 mL/hr over 30 Minutes Intravenous Every 6 hours 03/25/17 1612 03/25/17 2230       Objective: Physical Exam: Vitals:   03/28/17 2044 03/28/17 2347 03/29/17 0431 03/29/17 0811  BP: (!) 149/95 126/81 (!) 151/70 (!) 168/89  Pulse:  87 89 93  Resp: 16 20 20 19   Temp: 98.6 F (37 C) 98.6 F (37 C) 98.8 F (37.1 C) 98.5 F (36.9 C)  TempSrc: Oral Oral Oral Oral  SpO2: 93% 94% 100% 98%  Weight:      Height:       No intake or output data in the 24 hours ending 03/29/17 0836 Filed Weights   03/26/17 0500 03/27/17 0200 03/28/17 0407  Weight: 47.5 kg (104 lb 11.2 oz) 47.3 kg (104 lb 4.4 oz) 49.1 kg (108 lb 3.9 oz)   General: Alert, Awake and Oriented to Time, Place and [place]. Appear in mild distress, affect appropriate Eyes: PERRL, Conjunctiva normal ENT: Oral Mucosa clear moist. Neck: no JVD, no Abnormal Mass Or lumps Cardiovascular: S1 and S2 Present, no Murmur, Peripheral Pulses Present Respiratory: normal respiratory effort, Bilateral Air entry equal and Decreased, no use of accessory muscle, Clear to Auscultation, no Crackles, no wheezes Abdomen: Bowel Sound present, Soft and no tenderness, no  hernia Skin: no redness, no Rash, no induration Extremities: no Pedal edema, no calf tenderness Neurologic: Dysarthric.  Right-sided weakness.  Data Reviewed: CBC: Recent Labs  Lab 03/24/17 0414 03/25/17 0654 03/26/17 0215 03/27/17 0429 03/28/17 0314  WBC 8.8 10.4 7.6 6.4 5.5  NEUTROABS  --  9.0*  --   --   --   HGB 10.5* 9.9* 10.7* 9.9* 9.6*  HCT 32.6* 30.7* 32.6* 30.6* 29.9*  MCV 86.2 86.7 83.8 84.5 84.9  PLT 296 243 211 221 161   Basic Metabolic Panel: Recent Labs  Lab 03/23/17 0524 03/24/17 0414 03/25/17 0654 03/26/17 0215 03/27/17 0429  NA 146* 144 146* 140 139  K 3.0* 3.3* 3.4* 3.6 3.4*  CL 107 108 112* 109 109  CO2 25 25 23  21* 20*  GLUCOSE 132* 149* 150* 170* 178*  BUN 25* 22* 23* 25* 30*  CREATININE 1.51* 1.45* 1.41* 1.41* 1.55*  CALCIUM 8.7* 8.7* 8.4* 7.8* 7.8*  MG 1.8  --  1.6* 1.4* 1.9  PHOS  --   --  2.1* 2.4* 2.2*    Liver Function Tests: Recent Labs  Lab 03/25/17 0654  AST 45*  ALT 30  ALKPHOS 73  BILITOT 1.3*  PROT 6.1*  ALBUMIN 2.2*   No results for input(s): LIPASE, AMYLASE in the  last 168 hours. No results for input(s): AMMONIA in the last 168 hours. Coagulation Profile: No results for input(s): INR, PROTIME in the last 168 hours. Cardiac Enzymes: Recent Labs  Lab 03/22/17 1418  TROPONINI 0.12*   BNP (last 3 results) No results for input(s): PROBNP in the last 8760 hours. CBG: Recent Labs  Lab 03/28/17 1638 03/28/17 2042 03/29/17 0038 03/29/17 0434 03/29/17 0816  GLUCAP 142* 149* 172* 147* 120*   Studies: No results found.  Scheduled Meds: . aspirin  300 mg Rectal Daily   Or  . aspirin  325 mg Oral Daily  . atorvastatin  40 mg Oral q1800  . carvedilol  3.125 mg Per Tube BID WC  . enoxaparin (LOVENOX) injection  30 mg Subcutaneous Q24H  . free water  200 mL Per Tube Q6H  . pantoprazole sodium  40 mg Per Tube QHS  . phosphorus  250 mg Oral BID  . potassium chloride  40 mEq Oral Once   Continuous Infusions: . 0.9  % NaCl with KCl 40 mEq / L    . feeding supplement (OSMOLITE 1.2 CAL) 1,000 mL (03/28/17 1430)  . lactated ringers 10 mL/hr at 03/25/17 1103   PRN Meds: acetaminophen **OR** acetaminophen (TYLENOL) oral liquid 160 mg/5 mL **OR** acetaminophen, hydrALAZINE, HYDROcodone-acetaminophen, hydrOXYzine, menthol-cetylpyridinium **OR** phenol, methocarbamol, morphine injection, nitroGLYCERIN, ondansetron **OR** ondansetron (ZOFRAN) IV, senna-docusate, zolpidem  Time spent: 25 minutes     03/29/2017 8:36 AM  If 7PM-7AM, please contact night-coverage at [email or web address], password [place]

## 2017-03-29 NOTE — Progress Notes (Signed)
Physical Therapy Treatment Patient Details Name: Alyssa Savage MRN: [id number] DOB: 13-Nov-1930 Today's Date: 03/29/2017    History of Present Illness 84 y.o. female with a displaced Left femoral neck fracture and acute embolic left-sided basal ganglia stroke.Marland Kitchen Underwetn Left hip hemiarthroplasty, anterior approach 2/22.     PT Comments    Pt slowly progressing. Pt with improved movement of L LE however remains to have minimal active movement in R UE and LE. Pt with trace R shoulder movement and quad. Recommend PRAFO boot for R foot to assist in position of ankle in neutral due to significant drop foot. Pt motivated and very interactive in therapy. con't to recommend SNF upon d/c.    Follow Up Recommendations  SNF;Supervision/Assistance - 24 hour     Equipment Recommendations  (TBD)    Recommendations for Other Services       Precautions / Restrictions Precautions Precautions: Fall Precaution Comments: L ant hip wound vac Restrictions Weight Bearing Restrictions: Yes LLE Weight Bearing: Weight bearing as tolerated Other Position/Activity Restrictions: L hip wound vac    Mobility  Bed Mobility Overal bed mobility: Needs Assistance Bed Mobility: Supine to Sit     Supine to sit: Mod assist;+2 for physical assistance     General bed mobility comments: pt able assist with LE movement to EOB, pt pulled up on PT to bring self to EOB  Transfers Overall transfer level: Needs assistance Equipment used: 2 Fake hand held assist Transfers: Sit to/from Stand Sit to Stand: Max assist;+2 physical assistance         General transfer comment: completed 4 sit to stand trials. Pt with strong lean to the Right, with verbal cues pt able to contract buttock and elevate chest to achieve full upright posture. maxA on R side to maintain midline and R hand on stedy  Ambulation/Gait             General Gait Details: unable   Stairs            Wheelchair Mobility     Modified Rankin (Stroke Patients Only) Modified Rankin (Stroke Patients Only) Pre-Morbid Rankin Score: Slight disability Modified Rankin: Severe disability     Balance Overall balance assessment: Needs assistance Sitting-balance support: Feet supported;Single extremity supported Sitting balance-Leahy Scale: Poor Sitting balance - Comments: R lateral lean, worked on maintaining midline, able to [employer] with min guard x 30 sec before leaning to the R Postural control: Right lateral lean;Posterior lean Standing balance support: Single extremity supported Standing balance-Leahy Scale: Poor                              Cognition Arousal/Alertness: Awake/alert Behavior During Therapy: WFL for tasks assessed/performed Overall Cognitive Status: Impaired/Different from baseline Area of Impairment: Safety/judgement;Problem solving                           Awareness: Emergent Problem Solving: Slow processing;Difficulty sequencing General Comments: pt very interactive and able to answer questions appropriately      Exercises General Exercises - Lower Extremity Ankle Circles/Pumps: AROM;AAROM;Right;Left;10 reps Quad Sets: AROM;Both;10 reps;Supine(no noted contraction in R quad) Gluteal Sets: AROM;Both;10 reps;Supine Long Arc Quad: AROM;Left;10 reps;Seated Hip ABduction/ADduction: AAROM;Right;10 reps;Supine Other Exercises Other Exercises: worked on standing tolerance and achieving L knee terminal knee extension  Other Exercises: shoulder retraction in standing to achieve optimal standing posture Other Exercises: glut squeezes in standing to achieve optimal standing posture  General Comments        Pertinent Vitals/Pain Pain Assessment: Faces Faces Pain Scale: Hurts even more Pain Location: L hip Pain Descriptors / Indicators: Aching(with movement, minimal pain at rest) Pain Intervention(s): Monitored during session    Home Living                       Prior Function            PT Goals (current goals can now be found in the care plan section) Acute Rehab PT Goals PT Goal Formulation: With patient Time For Goal Achievement: 03/31/17 Potential to Achieve Goals: Good    Frequency    Min 4X/week      PT Plan Current plan remains appropriate    Co-evaluation              AM-PAC PT "6 Clicks" Daily Activity  Outcome Measure  Difficulty turning over in bed (including adjusting bedclothes, sheets and blankets)?: Unable Difficulty moving from lying on back to sitting on the side of the bed? : Unable Difficulty sitting down on and standing up from a chair with arms (e.g., wheelchair, bedside commode, etc,.)?: Unable Help needed moving to and from a bed to chair (including a wheelchair)?: Total Help needed walking in hospital room?: Total Help needed climbing 3-5 steps with a railing? : Total 6 Click Score: 6    End of Session Equipment Utilized During Treatment: Gait belt Activity Tolerance: Patient tolerated treatment well Patient left: in chair;with call bell/phone within reach;with family/visitor present(SLP present) Nurse Communication: Mobility status PT Visit Diagnosis: Unsteadiness on feet (R26.81);Other abnormalities of gait and mobility (R26.89);Muscle weakness (generalized) (M62.81);Pain;Hemiplegia and hemiparesis;Difficulty in walking, not elsewhere classified (R26.2);Other symptoms and signs involving the nervous system (R29.898) Hemiplegia - Right/Left: Right Hemiplegia - dominant/non-dominant: Dominant Hemiplegia - caused by: Cerebral infarction Pain - Right/Left: Left Pain - part of body: Hip     Time: 9371-6967 PT Time Calculation (min) (ACUTE ONLY): 26 min  Charges:  $Therapeutic Exercise: 8-22 mins $Therapeutic Activity: 8-22 mins                    G Codes:       Kittie Plater, PT, DPT Pager #: [phone] Office #: [phone]    [place] 03/29/2017, 11:18 AM

## 2017-03-29 NOTE — Progress Notes (Signed)
Modified Barium Swallow Progress Note  Patient Details  Name: Alyssa Savage MRN: [id number] Date of Birth: October 15, 1930  Today's Date: 03/29/2017  Modified Barium Swallow completed.  Full report located under Chart Review in the Imaging Section.  Brief recommendations include the following:  Clinical Impression  Pt's oropharyngeal abilites don't appear changed over previous MBS on 03/23/17. During this study, pt with severe oropharyngeal dysphagia c/b delayed swallow initiation as bolus (honey and nectar by spoon) falls from vallucula to pyriform sinuses resulting in penetration before and during swallow. Pt also demonstrate significant pharyngeal weakness resulting in widespread moderate to severe pharyngeal residue leading to trace aspiration of all consistencies. With Max A cues pt able to produce 6 to 7 consecutive swallows to decrease residue to mild residue. In addition to decreased safety with consumpiton, pt demonstrates decreased effeciency with PO consumption. she frequently struggled to breath, gasped, appeared strangled and required Max A cues to relax and swallow, breath. SLP recommends continued NPO status with consideration for long term alternative means of nutrition. SLP will follow for therapeutic exercise to include effortful swallows and CTAR to continue use of swallow musculature.    Swallow Evaluation Recommendations       SLP Diet Recommendations: NPO;Alternative means - long-term       Medication Administration: Via alternative means           Postural Changes: Remain semi-upright after after feeds/meals (Comment)   Oral Care Recommendations: Oral care QID   Other Recommendations: Have oral suction available    Kaori Jumper 03/29/2017,1:44 PM

## 2017-03-29 NOTE — Progress Notes (Signed)
Pt completed swallow eval and did not pass. Restarted NG tube feeds and notified MD. CT abd complete also. MD to discuss GTUBE placement with pt and family.

## 2017-03-29 NOTE — Plan of Care (Signed)
  Education: Knowledge of General Education information will improve 03/29/2017 0851 - Progressing by Anson Fret, RN Note POC reviewed with pt./daughter.

## 2017-03-30 ENCOUNTER — Other Ambulatory Visit: Payer: Self-pay | Admitting: Cardiology

## 2017-03-30 DIAGNOSIS — I639 Cerebral infarction, unspecified: Secondary | ICD-10-CM

## 2017-03-30 LAB — GLUCOSE, CAPILLARY
GLUCOSE-CAPILLARY: 165 mg/dL — AB (ref 65–99)
Glucose-Capillary: 109 mg/dL — ABNORMAL HIGH (ref 65–99)
Glucose-Capillary: 110 mg/dL — ABNORMAL HIGH (ref 65–99)
Glucose-Capillary: 139 mg/dL — ABNORMAL HIGH (ref 65–99)
Glucose-Capillary: 149 mg/dL — ABNORMAL HIGH (ref 65–99)

## 2017-03-30 MED ORDER — ENOXAPARIN SODIUM 30 MG/0.3ML ~~LOC~~ SOLN: 30.0000 mg | SUBCUTANEOUS | Status: DC

## 2017-03-30 MED ORDER — CEFAZOLIN SODIUM-DEXTROSE 2-4 GM/100ML-% IV SOLN
2.0000 g | INTRAVENOUS | Status: AC
Start: 2017-03-31 — End: 2017-03-31
  Administered 2017-03-31: 2 g via INTRAVENOUS
  Filled 2017-03-30: qty 100

## 2017-03-30 MED ORDER — MAGNESIUM OXIDE 400 (241.3 MG) MG PO TABS
400.0000 mg | ORAL_TABLET | Freq: Two times a day (BID) | ORAL | Status: DC
  Administered 2017-03-30 – 2017-04-04 (×11): 400 mg
  Filled 2017-03-30 (×12): qty 1

## 2017-03-30 MED ORDER — HYPROMELLOSE (GONIOSCOPIC) 2.5 % OP SOLN
1.0000 [drp] | Freq: Three times a day (TID) | OPHTHALMIC | Status: DC | PRN
  Filled 2017-03-30: qty 15

## 2017-03-30 MED ORDER — K PHOS MONO-SOD PHOS DI & MONO 155-852-130 MG PO TABS
250.0000 mg | ORAL_TABLET | Freq: Two times a day (BID) | ORAL | Status: AC
  Administered 2017-03-30 (×2): 250 mg
  Filled 2017-03-30 (×2): qty 1

## 2017-03-30 MED ORDER — OSMOLITE 1.2 CAL PO LIQD
1000.0000 mL | ORAL | Status: DC
Start: 2017-03-30 — End: 2017-04-04
  Administered 2017-03-30 – 2017-04-03 (×4): 1000 mL
  Filled 2017-03-30 (×9): qty 1000

## 2017-03-30 NOTE — Progress Notes (Signed)
Physical Therapy Treatment Patient Details Name: Alyssa Savage MRN: [id number] DOB: November 10, 1930 Today's Date: 03/30/2017    History of Present Illness 84 y.o. female with a displaced Left femoral neck fracture and acute embolic left-sided basal ganglia stroke.Marland Kitchen Underwetn Left hip hemiarthroplasty, anterior approach 2/22.     PT Comments    Pt with significant improvement in L LE strength and active movement however minimal improvement in R UE and LE active movement. Pt compliant with HEP including self R UE ROM with assist of L UE and bilat LE there ex. Pt very motivated and beginning to tolerate standing >30 sec and is able to achieve upright standing posture with verbal cues. Acute PT to con't to follow.    Follow Up Recommendations  SNF;Supervision/Assistance - 24 hour     Equipment Recommendations  None recommended by PT    Recommendations for Other Services       Precautions / Restrictions Precautions Precautions: Fall Precaution Comments: L ant hip wound vac Restrictions Weight Bearing Restrictions: Yes LLE Weight Bearing: Weight bearing as tolerated Other Position/Activity Restrictions: L hip wound vac    Mobility  Bed Mobility Overal bed mobility: Needs Assistance Bed Mobility: Supine to Sit     Supine to sit: Mod assist;+2 for physical assistance     General bed mobility comments: pt able to move L LE to EOB but requires maxA for R LE and trunk elevation  Transfers Overall transfer level: Needs assistance Equipment used: (2 Franssen lift with gait belt and pt pulling up on stedy) Transfers: Sit to/from Stand Sit to Stand: Max assist;+2 physical assistance         General transfer comment: pt able to assist more and place more weight through L hip and pull more with L UE  Ambulation/Gait             General Gait Details: unable   Stairs            Wheelchair Mobility    Modified Rankin (Stroke Patients Only) Modified Rankin (Stroke  Patients Only) Pre-Morbid Rankin Score: Slight disability Modified Rankin: Severe disability     Balance Overall balance assessment: Needs assistance Sitting-balance support: Feet supported;Single extremity supported Sitting balance-Leahy Scale: Poor Sitting balance - Comments: R lateral lean, worked on maintaining midline, able to [employer] with min guard x 30 sec before leaning to the R Postural control: Right lateral lean;Posterior lean Standing balance support: Single extremity supported Standing balance-Leahy Scale: Poor                              Cognition Arousal/Alertness: Awake/alert Behavior During Therapy: WFL for tasks assessed/performed Overall Cognitive Status: Within Functional Limits for tasks assessed                                 General Comments: pt able to follow commands consistantly and is very motivated      Exercises Other Exercises Other Exercises: worked on weight shifting L/R in standing x 10 reps. pt able to shift at hips with assist at R knee to prevent buckling and tech to assist with upright trunk Other Exercises: worked on mini squats x 10 reps Other Exercises: worked on achieving midline upright position in stedy    General Comments        Pertinent Vitals/Pain Pain Assessment: Faces Faces Pain Scale: Hurts even more Pain Location: L  hip with movement Pain Descriptors / Indicators: Aching Pain Intervention(s): Monitored during session    Home Living                      Prior Function            PT Goals (current goals can now be found in the care plan section) Progress towards PT goals: Progressing toward goals    Frequency    Min 4X/week      PT Plan Current plan remains appropriate    Co-evaluation              AM-PAC PT "6 Clicks" Daily Activity  Outcome Measure  Difficulty turning over in bed (including adjusting bedclothes, sheets and blankets)?: Unable Difficulty  moving from lying on back to sitting on the side of the bed? : Unable Difficulty sitting down on and standing up from a chair with arms (e.g., wheelchair, bedside commode, etc,.)?: Unable Help needed moving to and from a bed to chair (including a wheelchair)?: Total Help needed walking in hospital room?: Total Help needed climbing 3-5 steps with a railing? : Total 6 Click Score: 6    End of Session Equipment Utilized During Treatment: Gait belt Activity Tolerance: Patient tolerated treatment well Patient left: in chair;with call bell/phone within reach;with family/visitor present Nurse Communication: Mobility status PT Visit Diagnosis: Unsteadiness on feet (R26.81);Other abnormalities of gait and mobility (R26.89);Muscle weakness (generalized) (M62.81);Pain;Hemiplegia and hemiparesis;Difficulty in walking, not elsewhere classified (R26.2);Other symptoms and signs involving the nervous system (R29.898) Hemiplegia - Right/Left: Right Hemiplegia - dominant/non-dominant: Dominant Hemiplegia - caused by: Cerebral infarction Pain - Right/Left: Left Pain - part of body: Hip     Time: 5573-2202 PT Time Calculation (min) (ACUTE ONLY): 38 min  Charges:  $Therapeutic Exercise: 23-37 mins $Therapeutic Activity: 8-22 mins                    G Codes:       Kittie Plater, PT, DPT Pager #: [phone] Office #: [phone]    [place] 03/30/2017, 2:45 PM

## 2017-03-30 NOTE — Progress Notes (Signed)
Nutrition Follow-up  DOCUMENTATION CODES:   Severe malnutrition in context of chronic illness  INTERVENTION:  When medically appropriate, restart Osmolite 1.2 at 28mL/hr Provides 1440 calories, 67gm protein, 935mL free water  273mL free water every 6 hours per MD. Total free water 1719mL  Continue to replete Mg, Phos, K+ as needed.  NUTRITION DIAGNOSIS:   Severe Malnutrition related to chronic illness as evidenced by severe muscle depletion, severe fat depletion. -ongoing  GOAL:   Patient will meet greater than or equal to 90% of their needs -will meet with TF  MONITOR:   I & O's, Skin, TF tolerance, Vent status, Diet advancement, Labs  ASSESSMENT:   Pt. with no known PMH; admitted on 03/21/2017, presented with complaint of fall, was found to have acute stroke as well as left hip fracture and AKI.  03/23/17 - Cortrak placed, gastric 03/24/17 - Osmolite 1.2 started @ 10 mL/hr with recommendation to advance by 10 mL/hr every 12 hrs to goal rate of 50 mL/hr 03/25/17 - s/p L hip hemiarthroplasty 03/26/17 - started free water flushes through Cortrak of 200 mL QID 2/27 - Failed MBS, PEG placement scheduled  Dispo: D/C to snf following PEG placement   RD consulted to begin tubefeeding, PEG placement scheduled for today. Previously patient was tolerating TF, no complaints.  745mL UOP last 24 hrs 10.6L Fluid Positive  Labs reviewed:  Elevated LFTs, AST 236, ALT 115  Medications reviewed and include:  K Phos, 40 K+, MagOx LR at [place] Order:  Fall precautions  EDUCATION NEEDS:   Not appropriate for education at this time  Skin:  Skin Assessment: Skin Integrity Issues: Skin Integrity Issues:: Incisions Incisions: closed incision to L hip  Last BM:  03/30/2017  Height:   Ht Readings from Last 1 Encounters:  03/25/17 5' (1.524 m)    Weight:   Wt Readings from Last 1 Encounters:  03/30/17 108 lb 7.5 oz (49.2 kg)    Ideal Body Weight:  45.45 kg  BMI:   Body mass index is 21.18 kg/m.  Estimated Nutritional Needs:   Kcal:  2595-6387 calories  Protein:  61-70 grams (1.5-1.7g/kg)  Fluid:  >1.5L  Satira Anis. Marinell Igarashi, MS, RD LDN Inpatient Clinical Dietitian Pager [phone]

## 2017-03-30 NOTE — Progress Notes (Signed)
  Speech Language Pathology Treatment: Dysphagia;Cognitive-Linquistic  Patient Details Name: Alyssa Savage MRN: [id number] DOB: [birth year] Today's Date: 03/30/2017 Time: 1300-1330 SLP Time Calculation (min) (ACUTE ONLY): 30 min  Assessment / Plan / Recommendation Clinical Impression  Skilled treatment session focused on dysphagia and communication goals. SLP facilitated session by providing education to daughter on results of MBS on 03/29/17 and recommendation for continued NPO (PEG placement scheduled for 03/31/17) and POC to target pharyngeal strengthening exercises. CTAR introduced and pt able to complete with Max A cues. Also suspect that pt needed repetition to coordinate swallow while pressing against washcloth. SLP also provided Mod A to Max a cues to use slow rate and over-articulation to achieve ~ 50% intelligibility at the phrase level (2 word level). Pt to perform exercises later in afternoon with daughter. Pt left upright in chair with all needs within reach and daughter present.      HPI: 84 y.o.femalewho notes that she has had significant slurred speech since 03/21/17. Head CT was performed which shows right basal ganglia infarct; MRI shows Acute infarction in the posteriorLEFTbasal ganglia, posterior limb internal capsule and radiating white matter tracts.  Recent fall with left hip fracture.       SLP Plan  Continue with current plan of care       Recommendations  Diet recommendations: NPO Medication Administration: Via alternative means                Oral Care Recommendations: Oral care QID Follow up Recommendations: Skilled Nursing facility SLP Visit Diagnosis: Dysphagia, oropharyngeal phase (R13.12);Dysarthria and anarthria (R47.1) Plan: Continue with current plan of care       [place] 03/30/2017, 1:38 PM

## 2017-03-30 NOTE — Progress Notes (Signed)
Chief Complaint: Patient was seen in consultation today for dysphagia at the request of Dr. Reyne Dumas  Referring Physician(s): Dr. Reyne Dumas  Supervising Physician: Marybelle Killings  Patient Status: [facility] - In-pt  History of Present Illness: Alyssa Savage is a 84 y.o. female who has suffered recent left hip fracture and CVA. She is left with residual dysphagia as a result. She is currently getting fed via NGT but failed another swallow eval yesterday. IR is asked to place perc G-tube. Chart, imaging, meds, labs, allergies reviewed.  CT scan showed acceptable anatomic approach for percutaneous access. She did have disended bowel loops but states she has had a 'good' BM and passing flatus. Daughter at bedside  Past Medical History:  Diagnosis Date  . Femoral neck fracture ([place]) 03/2017   left   . GERD (gastroesophageal reflux disease)   . Hypertensive emergency 03/2017  . Hypokalemia 03/2017  . Stroke [facility])     Past Surgical History:  Procedure Laterality Date  . ANTERIOR APPROACH HEMI HIP ARTHROPLASTY Left 03/25/2017   Procedure: ANTERIOR APPROACH HEMI HIP ARTHROPLASTY;  Surgeon: Rod Can, MD;  Location: [place];  Service: Orthopedics;  Laterality: Left;  . NO PAST SURGERIES      Allergies: Patient has no known allergies.  Medications:  Current Facility-Administered Medications:  .  acetaminophen (TYLENOL) tablet 650 mg, 650 mg, Oral, Q4H PRN, 650 mg at 03/26/17 1738 **OR** acetaminophen (TYLENOL) solution 650 mg, 650 mg, Per Tube, Q4H PRN **OR** acetaminophen (TYLENOL) suppository 650 mg, 650 mg, Rectal, Q4H PRN, Ivor Costa, MD .  aspirin suppository 300 mg, 300 mg, Rectal, Daily, 300 mg at 03/30/17 1110 **OR** aspirin tablet 325 mg, 325 mg, Oral, Daily, Ivor Costa, MD, 325 mg at 03/29/17 1027 .  atorvastatin (LIPITOR) tablet 40 mg, 40 mg, Oral, q1800, Costello, Mary A, NP, 40 mg at 03/29/17 1729 .  carvedilol (COREG) tablet 3.125 mg, 3.125 mg, Per Tube, BID  WC, Dana Allan I, MD, 3.125 mg at 03/30/17 1141 .  [START ON 03/31/2017] enoxaparin (LOVENOX) injection 30 mg, 30 mg, Subcutaneous, Q24H, Abrol, Nayana, MD .  feeding supplement (OSMOLITE 1.2 CAL) liquid 1,000 mL, 1,000 mL, Per Tube, Continuous, Abrol, Nayana, MD .  free water 200 mL, 200 mL, Per Tube, Q6H, Dana Allan I, MD, 200 mL at 03/30/17 0600 .  hydrALAZINE (APRESOLINE) injection 10 mg, 10 mg, Intravenous, Q4H PRN, Dana Allan I, MD, 10 mg at 03/30/17 1132 .  HYDROcodone-acetaminophen (NORCO/VICODIN) 5-325 MG per tablet 1-2 tablet, 1-2 tablet, Oral, Q6H PRN, Rod Can, MD, 1 tablet at 03/29/17 1728 .  hydroxypropyl methylcellulose / hypromellose (ISOPTO TEARS / GONIOVISC) 2.5 % ophthalmic solution 1 drop, 1 drop, Both Eyes, TID PRN, Abrol, Nayana, MD .  hydrOXYzine (ATARAX/VISTARIL) tablet 10 mg, 10 mg, Oral, TID PRN, Ivor Costa, MD .  lactated ringers infusion, , Intravenous, Continuous, Montez Hageman, MD, Last Rate: 10 mL/hr at 03/25/17 1103 .  magnesium oxide (MAG-OX) tablet 400 mg, 400 mg, Per Tube, BID, Abrol, Nayana, MD, 400 mg at 03/30/17 1141 .  menthol-cetylpyridinium (CEPACOL) lozenge 3 mg, 1 lozenge, Oral, PRN **OR** phenol (CHLORASEPTIC) mouth spray 1 spray, 1 spray, Mouth/Throat, PRN, Swinteck, Aaron Edelman, MD .  methocarbamol (ROBAXIN) tablet 500 mg, 500 mg, Oral, Q8H PRN, Ivor Costa, MD .  morphine 4 MG/ML injection 2 mg, 2 mg, Intravenous, Q4H PRN, Ivor Costa, MD, 2 mg at 03/23/17 2018 .  nitroGLYCERIN (NITROSTAT) SL tablet 0.4 mg, 0.4 mg, Sublingual, Q5 min PRN, Ivor Costa, MD .  ondansetron (ZOFRAN) tablet 4 mg, 4 mg, Oral, Q6H PRN **OR** ondansetron (ZOFRAN) injection 4 mg, 4 mg, Intravenous, Q6H PRN, Swinteck, Aaron Edelman, MD .  pantoprazole sodium (PROTONIX) 40 mg/20 mL oral suspension 40 mg, 40 mg, Per Tube, QHS, Dana Allan I, MD, 40 mg at 03/29/17 2255 .  phosphorus (K PHOS NEUTRAL) tablet 250 mg, 250 mg, Per Tube, BID, Abrol, Nayana, MD, 250 mg at  03/30/17 1141 .  potassium chloride SA (K-DUR,KLOR-CON) CR tablet 40 mEq, 40 mEq, Oral, Once, Dana Allan I, MD .  senna-docusate (Senokot-S) tablet 1 tablet, 1 tablet, Oral, QHS PRN, Ivor Costa, MD .  zolpidem (AMBIEN) tablet 5 mg, 5 mg, Oral, QHS PRN, Ivor Costa, MD    Family History  Problem Relation Age of Onset  . CAD Mother   . Heart disease Mother     Social History   Socioeconomic History  . Marital status: Widowed    Spouse name: None  . Number of children: None  . Years of education: None  . Highest education level: None  Social Needs  . Financial resource strain: None  . Food insecurity - worry: None  . Food insecurity - inability: None  . Transportation needs - medical: None  . Transportation needs - non-medical: None  Occupational History  . None  Tobacco Use  . Smoking status: Never Smoker  . Smokeless tobacco: Never Used  Substance and Sexual Activity  . Alcohol use: No    Frequency: Never  . Drug use: No  . Sexual activity: None  Other Topics Concern  . None  Social History Narrative  . None     Review of Systems: A 12 point ROS discussed and pertinent positives are indicated in the HPI above.  All other systems are negative.  Review of Systems  Vital Signs: BP (!) 164/100 (BP Location: Left Arm)   Pulse 82   Temp (!) 96.8 F (36 C) (Axillary)   Resp 17   Ht 5' (1.524 m)   Wt 108 lb 7.5 oz (49.2 kg)   SpO2 97%   BMI 21.18 kg/m   Physical Exam  Constitutional: She is oriented to [place], place, and time. She appears well-developed. No distress.  HENT:  Head: Normocephalic.  Mouth/Throat: Oropharynx is clear and moist.  Neck: Normal range of motion. No JVD present.  Cardiovascular: Normal rate, regular rhythm and normal heart sounds.  Pulmonary/Chest: Effort normal and breath sounds normal. No respiratory distress.  Abdominal: Soft. She exhibits no distension and no mass. There is no tenderness.  Neurological: She is alert and  oriented to [place], place, and time.  Skin: Skin is warm.      Imaging: Ct Abdomen Wo Contrast  Result Date: 03/29/2017 CLINICAL DATA:  Dysphagia, preop evaluation for gastrostomy tube EXAM: CT ABDOMEN WITHOUT CONTRAST TECHNIQUE: Multidetector CT imaging of the abdomen was performed following the standard protocol without IV contrast. COMPARISON:  None. FINDINGS: Lower chest: Scattered coronary calcifications. Visualized lung bases clear. No pleural or pericardial effusion. Hepatobiliary: No focal liver abnormality is seen. No gallstones, gallbladder wall thickening, or biliary dilatation. Pancreas: Unremarkable. No pancreatic ductal dilatation or surrounding inflammatory changes. Spleen: Normal in size without focal abnormality. Adrenals/Urinary Tract: Probable bilateral renal cysts, incompletely characterized. No hydronephrosis. Stomach/Bowel: Feeding tube extends just across the pylorus. The stomach is nondilated. There is a safe percutaneous approach for gastrostomy placement. Mild gaseous distention of visualized loops of small bowel and colon. Vascular/Lymphatic: Minimal scattered aortic atheromatous calcifications without aneurysm. No abdominal adenopathy  localized. Other: Lobular right pelvic mass with coarse peripherally calcified components probably myomatous uterus but incompletely characterized. No ascites. No free air. Musculoskeletal: No acute or significant osseous findings. IMPRESSION: 1. A safe percutaneous approach for gastrostomy placement is identified. 2. Coronary and Aortic Atherosclerosis (ICD10-170.0). 3. Probable myomatous uterus, incompletely characterized. Electronically Signed   By: Lucrezia Europe M.D.   On: 03/29/2017 13:40   Dg Chest 1 View  Result Date: 03/21/2017 CLINICAL DATA:  Fall EXAM: CHEST 1 VIEW COMPARISON:  None. FINDINGS: No acute pulmonary infiltrate or effusion. Mild cardiomegaly. Tortuous ectatic aorta with mild atherosclerosis. No pneumothorax. IMPRESSION: 1.  Negative for acute infiltrate or edema 2. Cardiomegaly Electronically Signed   By: Donavan Foil M.D.   On: 03/21/2017 19:50   Dg Pelvis 1-2 Views  Result Date: 03/21/2017 CLINICAL DATA:  Fall with pain EXAM: PELVIS - 1-2 VIEW COMPARISON:  None. FINDINGS: Acute, displaced left femoral neck fracture with mild superior migration of the distal femur. Left femoral head projects in joint. Pubic symphysis and rami are intact. Normal right hip alignment. Multiple large calcified masses within the pelvis, measuring up to 6.4 cm. IMPRESSION: 1. Acute displaced left femoral neck fracture 2. Multiple large calcified masses in the pelvis, possibly due to large calcified fibroids, CT could better localize the calcifications. Electronically Signed   By: Donavan Foil M.D.   On: 03/21/2017 19:47   Ct Head Wo Contrast  Result Date: 03/21/2017 CLINICAL DATA:  [place] 2 weeks ago, altered mental status. EXAM: CT HEAD WITHOUT CONTRAST TECHNIQUE: Contiguous axial images were obtained from the base of the skull through the vertex without intravenous contrast. COMPARISON:  None. FINDINGS: BRAIN: Patchy hypodense RIGHT basal ganglia. Old LEFT basal ganglia and bilateral thalamus lacunar infarcts. Small area suspected RIGHT frontal lobe encephalomalacia. Patchy to confluent supratentorial white matter hypodensities. No advanced parenchymal brain volume loss for age. No hydrocephalus. No hemorrhage. No abnormal extra-axial fluid collections. VASCULAR: Mild calcific atherosclerosis of the carotid siphons. SKULL: No skull fracture. Anteriorly subluxed mandible condyles presumably from open mouth positioning. No significant scalp soft tissue swelling. SINUSES/ORBITS: The mastoid air-cells and included paranasal sinuses are well-aerated.The included ocular globes and orbital contents are non-suspicious. OTHER: 14 mm single bubble of gas LEFT infratemporal fossa. IMPRESSION: 1. Acute nonhemorrhagic RIGHT basal ganglia infarct. 2. Old  bilateral basal ganglia and thalamus lacunar infarcts. Moderate to severe chronic small vessel ischemic disease. 3. Acute findings discussed with and reconfirmed by Dr.JON KNAPP on 03/21/2017 at 8:44 pm. Electronically Signed   By: Elon Alas M.D.   On: 03/21/2017 20:45   Mr Brain Wo Contrast  Result Date: 03/22/2017 CLINICAL DATA:  [place] 2 weeks ago. Gait disturbance. Slurred speech. Left facial droop. EXAM: MRI HEAD WITHOUT CONTRAST MRA HEAD WITHOUT CONTRAST TECHNIQUE: Multiplanar, multiecho pulse sequences of the brain and surrounding structures were obtained without intravenous contrast. Angiographic images of the head were obtained using MRA technique without contrast. COMPARISON:  CT 03/21/2017 FINDINGS: MRI HEAD FINDINGS Brain: 1.5 x 2 cm region of acute infarction in the posterior basal ganglia/posterior limb internal capsule/radiating white matter tracts on the left. No mass effect or hemorrhage. No other acute infarction. Elsewhere, there are extensive chronic small vessel ischemic changes throughout the pons. No focal cerebellar insult. Chronic small-vessel ischemic changes are present throughout the thalami, basal ganglia and hemispheric white matter. Scattered foci of hemosiderin deposition are present scattered throughout the brain related to the old small vessel insults. No evidence of large vessel territory infarction or mass  lesion. No sign of acute hemorrhage. No hydrocephalus or extra-axial collection. Vascular: Major vessels at the base of the brain show flow. Skull and upper cervical spine: Negative Sinuses/Orbits: Clear/normal Other: None MRA HEAD FINDINGS Both internal carotid arteries are patent through the skull base and siphon regions. The anterior and middle cerebral vessels are patent without proximal stenosis. More distal branch vessels show atherosclerotic irregularity and narrowing, particularly evident in the anterior cerebral arteries. Both vertebral arteries are patent to  the basilar. No basilar stenosis. Posterior circulation branch vessels are patent. Pronounced atherosclerotic irregularity of the PCA branches. IMPRESSION: 1.5-2 cm acute infarction in the posterior left basal ganglia, posterior limb internal capsule and radiating white matter tracts. No mass effect or hemorrhage. Widespread chronic small-vessel ischemic changes elsewhere throughout the brain as outlined above. No large vessel occlusion or correctable proximal stenosis. Widespread intracranial medium to small vessel atherosclerotic narrowing and irregularity. Electronically Signed   By: Nelson Chimes M.D.   On: 03/22/2017 11:49   Pelvis Portable  Result Date: 03/25/2017 CLINICAL DATA:  Postop EXAM: PORTABLE PELVIS 1-2 VIEWS COMPARISON:  None. FINDINGS: Left hip arthroplasty, in satisfactory position. Mild degenerative changes of the right hip. Visualized bony pelvis appears intact. Suspected calcified uterine fibroids. Additional residual contrast in the distal small bowel and right colon. IMPRESSION: Left hip arthroplasty, in satisfactory position. Electronically Signed   By: Julian Hy M.D.   On: 03/25/2017 13:57   Dg Abd Portable 1v  Result Date: 03/23/2017 CLINICAL DATA:  Evaluate feeding tube placement EXAM: PORTABLE ABDOMEN - 1 VIEW COMPARISON:  None FINDINGS: The enteric tube tip is in the projection of the expected location of the distal stomach. Diffuse gaseous distension of the large bowel loops identified. Enteric contrast material from swallow study performed earlier today is identified within nondilated small bowel loops. Large calcified fibroids noted within the pelvis. IMPRESSION: 1. Enteric tube tip projects over the expected location of the distal stomach. 2. Diffuse gaseous distension of the large bowel loops which may represent colonic ileus or distal large bowel obstruction. Electronically Signed   By: Kerby Moors M.D.   On: 03/23/2017 17:26   Dg Swallowing Func-speech  Pathology  Result Date: 03/29/2017 Objective Swallowing Evaluation: Type of Study: MBS-Modified Barium Swallow Study  Patient Details Name: Alyssa Savage MRN: [id number] Date of Birth: 1930/09/24 Today's Date: 03/29/2017 Time: SLP Start Time (ACUTE ONLY): 1125 -SLP Stop Time (ACUTE ONLY): 1145 SLP Time Calculation (min) (ACUTE ONLY): 20 min Past Medical History: Past Medical History: Diagnosis Date . Femoral neck fracture ([place]) 03/2017  left  . GERD (gastroesophageal reflux disease)  . Hypertensive emergency 03/2017 . Hypokalemia 03/2017 . Stroke [facility])  Past Surgical History: Past Surgical History: Procedure Laterality Date . ANTERIOR APPROACH HEMI HIP ARTHROPLASTY Left 03/25/2017  Procedure: ANTERIOR APPROACH HEMI HIP ARTHROPLASTY;  Surgeon: Rod Can, MD;  Location: [place];  Service: Orthopedics;  Laterality: Left; . NO PAST SURGERIES   HPI: 84 y.o.femalewho notes that she has had significant slurred speech since 03/21/17. Head CT was performed which shows right basal ganglia infarct; MRI shows Acute infarction in the posteriorLEFTbasal ganglia, posterior limb internal capsule and radiating white matter tracts.  Recent fall with left hip fracture.  Subjective: alert, cooperative, very talkative but unintelligible Assessment / Plan / Recommendation CHL IP CLINICAL IMPRESSIONS 03/29/2017 Clinical Impression Pt's oropharyngeal abilites don't appear changed over previous MBS on 03/23/17. During this study, pt with severe oropharyngeal dysphagia c/b delayed swallow initiation as bolus (honey and nectar by spoon) falls  from vallucula to pyriform sinuses resulting in penetration before and during swallow. Pt also demonstrate significant pharyngeal weakness resulting in widespread moderate to severe pharyngeal residue leading to trace aspiration of all consistencies. With Max A cues pt able to produce 6 to 7 consecutive swallows to decrease residue to mild residue. In addition to decreased safety with consumpiton,  pt demonstrates decreased effeciency with PO consumption. she frequently struggled to breath, gasped, appeared strangled and required Max A cues to relax and swallow, breath. SLP recommends continued NPO status with consideration for long term alternative means of nutrition. SLP will follow for therapeutic exercise to include effortful swallows and CTAR to continue use of swallow musculature.  SLP Visit Diagnosis Dysphagia, oropharyngeal phase (R13.12) Attention and concentration deficit following -- Frontal lobe and executive function deficit following -- Impact on safety and function Severe aspiration risk;Risk for inadequate nutrition/hydration   CHL IP TREATMENT RECOMMENDATION 03/29/2017 Treatment Recommendations Therapy as outlined in treatment plan below   Prognosis 03/29/2017 Prognosis for Safe Diet Advancement Fair Barriers to Reach Goals Severity of deficits Barriers/Prognosis Comment -- CHL IP DIET RECOMMENDATION 03/29/2017 SLP Diet Recommendations NPO;Alternative means - long-term Liquid Administration via -- Medication Administration Via alternative means Compensations -- Postural Changes Remain semi-upright after after feeds/meals (Comment)   CHL IP OTHER RECOMMENDATIONS 03/29/2017 Recommended Consults -- Oral Care Recommendations Oral care QID Other Recommendations Have oral suction available   [facility] IP FOLLOW UP RECOMMENDATIONS 03/29/2017 Follow up Recommendations Skilled Nursing facility   [facility] IP FREQUENCY AND DURATION 03/29/2017 Speech Therapy Frequency (ACUTE ONLY) min 2x/week Treatment Duration 2 weeks      CHL IP ORAL PHASE 03/29/2017 Oral Phase Impaired Oral - Pudding Teaspoon -- Oral - Pudding Cup -- Oral - Honey Teaspoon Right anterior bolus loss Oral - Honey Cup -- Oral - Nectar Teaspoon Right anterior bolus loss Oral - Nectar Cup -- Oral - Nectar Straw -- Oral - Thin Teaspoon NT Oral - Thin Cup -- Oral - Thin Straw -- Oral - Puree NT Oral - Mech Soft -- Oral - Regular -- Oral - Multi-Consistency --  Oral - Pill -- Oral Phase - Comment --  CHL IP PHARYNGEAL PHASE 03/29/2017 Pharyngeal Phase Impaired Pharyngeal- Pudding Teaspoon -- Pharyngeal -- Pharyngeal- Pudding Cup -- Pharyngeal -- Pharyngeal- Honey Teaspoon Delayed swallow initiation-vallecula;Delayed swallow initiation-pyriform sinuses;Reduced pharyngeal peristalsis Pharyngeal -- Pharyngeal- Honey Cup -- Pharyngeal -- Pharyngeal- Nectar Teaspoon -- Pharyngeal -- Pharyngeal- Nectar Cup -- Pharyngeal -- Pharyngeal- Nectar Straw -- Pharyngeal -- Pharyngeal- Thin Teaspoon -- Pharyngeal -- Pharyngeal- Thin Cup -- Pharyngeal -- Pharyngeal- Thin Straw -- Pharyngeal -- Pharyngeal- Puree -- Pharyngeal -- Pharyngeal- Mechanical Soft -- Pharyngeal -- Pharyngeal- Regular -- Pharyngeal -- Pharyngeal- Multi-consistency -- Pharyngeal -- Pharyngeal- Pill -- Pharyngeal -- Pharyngeal Comment --  No flowsheet data found. No flowsheet data found. Alyssa Savage 03/29/2017, 1:45 PM              Dg Swallowing Func-speech Pathology  Result Date: 03/23/2017 Objective Swallowing Evaluation: Type of Study: MBS-Modified Barium Swallow Study  Patient Details Name: Alyssa Savage MRN: [id number] Date of Birth: Oct 10, 1930 Today's Date: 03/23/2017 Time: SLP Start Time (ACUTE ONLY): 1350 -SLP Stop Time (ACUTE ONLY): 1425 SLP Time Calculation (min) (ACUTE ONLY): 35 min Past Medical History: Past Medical History: Diagnosis Date . Femoral neck fracture ([place]) 03/2017  left  . GERD (gastroesophageal reflux disease)  . Hypertensive emergency 03/2017 . Hypokalemia 03/2017 . Stroke [facility])  Past Surgical History: Past Surgical History: Procedure Laterality Date . NO PAST  SURGERIES   HPI: 84 y.o.femalewho notes that she has had significant slurred speech since 03/21/17. Head CT was performed which shows right basal ganglia infarct; MRI shows Acute infarction in the posteriorLEFTbasal ganglia, posterior limb internal capsule and radiating white matter tracts.  Recent fall with left hip fracture.   Subjective: alert, cooperative Assessment / Plan / Recommendation CHL IP CLINICAL IMPRESSIONS 03/23/2017 Clinical Impression Pt presents with a moderate-severe oropharyngeal dysphagia marked by significant impairments in oral control/bolus containment and propulsion, poor labial seal; ineffective laryngeal vestibular closure, leading to eventual penetration and trace aspiration of most consistencies, either before the swallow or after the swallow when residue spills into airway; poor pharyngeal contraction with residuals throughout pharynx.  For now, recommend NPO with temporary enteral feeding (Dr. Posey Pronto has ordered cortrak); allow ice chips after oral care.  SLP will follow for therapeutic exercise and PO trials.  Pt agrees with plan.  SLP Visit Diagnosis Dysphagia, oropharyngeal phase (R13.12) Attention and concentration deficit following -- Frontal lobe and executive function deficit following -- Impact on safety and function Severe aspiration risk   CHL IP TREATMENT RECOMMENDATION 03/23/2017 Treatment Recommendations Therapy as outlined in treatment plan below   No flowsheet data found. CHL IP DIET RECOMMENDATION 03/23/2017 SLP Diet Recommendations NPO;Alternative means - temporary Liquid Administration via -- Medication Administration Via alternative means Compensations -- Postural Changes --   CHL IP OTHER RECOMMENDATIONS 03/23/2017 Recommended Consults -- Oral Care Recommendations Oral care QID Other Recommendations --   CHL IP FOLLOW UP RECOMMENDATIONS 03/23/2017 Follow up Recommendations Inpatient Rehab   CHL IP FREQUENCY AND DURATION 03/23/2017 Speech Therapy Frequency (ACUTE ONLY) min 3x week Treatment Duration 2 weeks      CHL IP ORAL PHASE 03/23/2017 Oral Phase Impaired Oral - Pudding Teaspoon -- Oral - Pudding Cup -- Oral - Honey Teaspoon Right anterior bolus loss;Weak lingual manipulation;Lingual pumping;Reduced posterior propulsion;Right pocketing in lateral sulci;Lingual/palatal residue;Piecemeal  swallowing;Delayed oral transit;Decreased bolus cohesion;Premature spillage Oral - Honey Cup -- Oral - Nectar Teaspoon Right anterior bolus loss;Weak lingual manipulation;Lingual pumping;Reduced posterior propulsion;Right pocketing in lateral sulci;Lingual/palatal residue;Piecemeal swallowing;Delayed oral transit;Decreased bolus cohesion;Premature spillage Oral - Nectar Cup -- Oral - Nectar Straw -- Oral - Thin Teaspoon Right anterior bolus loss;Weak lingual manipulation;Lingual pumping;Reduced posterior propulsion;Right pocketing in lateral sulci;Lingual/palatal residue;Piecemeal swallowing;Delayed oral transit;Decreased bolus cohesion;Premature spillage Oral - Thin Cup -- Oral - Thin Straw -- Oral - Puree Right anterior bolus loss;Weak lingual manipulation;Lingual pumping;Reduced posterior propulsion;Right pocketing in lateral sulci;Lingual/palatal residue;Piecemeal swallowing;Delayed oral transit;Decreased bolus cohesion;Premature spillage Oral - Mech Soft -- Oral - Regular -- Oral - Multi-Consistency -- Oral - Pill -- Oral Phase - Comment --  CHL IP PHARYNGEAL PHASE 03/23/2017 Pharyngeal Phase Impaired Pharyngeal- Pudding Teaspoon -- Pharyngeal -- Pharyngeal- Pudding Cup -- Pharyngeal -- Pharyngeal- Honey Teaspoon Delayed swallow initiation-pyriform sinuses;Reduced pharyngeal peristalsis;Reduced epiglottic inversion;Reduced anterior laryngeal mobility;Reduced laryngeal elevation;Reduced airway/laryngeal closure;Reduced tongue base retraction;Penetration/Apiration after swallow;Trace aspiration;Pharyngeal residue - valleculae;Pharyngeal residue - pyriform Pharyngeal Material enters airway, passes BELOW cords and not ejected out despite cough attempt by patient Pharyngeal- Honey Cup -- Pharyngeal -- Pharyngeal- Nectar Teaspoon Delayed swallow initiation-pyriform sinuses;Reduced pharyngeal peristalsis;Reduced epiglottic inversion;Reduced anterior laryngeal mobility;Reduced laryngeal elevation;Reduced  airway/laryngeal closure;Reduced tongue base retraction;Penetration/Apiration after swallow;Trace aspiration;Pharyngeal residue - valleculae;Pharyngeal residue - pyriform Pharyngeal Material enters airway, passes BELOW cords and not ejected out despite cough attempt by patient Pharyngeal- Nectar Cup -- Pharyngeal -- Pharyngeal- Nectar Straw -- Pharyngeal -- Pharyngeal- Thin Teaspoon Delayed swallow initiation-pyriform sinuses;Reduced pharyngeal peristalsis;Reduced epiglottic inversion;Reduced anterior laryngeal mobility;Reduced laryngeal elevation;Reduced  airway/laryngeal closure;Reduced tongue base retraction;Penetration/Apiration after swallow;Trace aspiration;Pharyngeal residue - valleculae;Pharyngeal residue - pyriform Pharyngeal Material enters airway, passes BELOW cords and not ejected out despite cough attempt by patient Pharyngeal- Thin Cup -- Pharyngeal -- Pharyngeal- Thin Straw -- Pharyngeal -- Pharyngeal- Puree Delayed swallow initiation-pyriform sinuses;Reduced pharyngeal peristalsis;Reduced epiglottic inversion;Reduced anterior laryngeal mobility;Reduced laryngeal elevation;Reduced tongue base retraction;Pharyngeal residue - valleculae;Pharyngeal residue - pyriform Pharyngeal Material enters airway, remains ABOVE vocal cords and not ejected out Pharyngeal- Mechanical Soft -- Pharyngeal -- Pharyngeal- Regular -- Pharyngeal -- Pharyngeal- Multi-consistency -- Pharyngeal -- Pharyngeal- Pill -- Pharyngeal -- Pharyngeal Comment --  No flowsheet data found. No flowsheet data found. Alyssa Savage 03/23/2017, 2:41 PM              Dg C-arm 1-60 Min  Result Date: 03/25/2017 CLINICAL DATA:  84 year old female with left femoral neck fracture undergoing ORIF. EXAM: OPERATIVE left HIP (WITH PELVIS IF PERFORMED) 2 VIEWS TECHNIQUE: Fluoroscopic spot image(s) were submitted for interpretation post-operatively. COMPARISON:  Preoperative radiographs 03/21/2017. FINDINGS: 2 intraoperative fluoroscopic AP spot  views of the left hip demonstrate a proximal left femur hemiarthroplasty. The femoral head component appears normally aligned in the AP projection with the acetabulum. No unexpected osseous changes. Bulky calcified pelvic mass is redemonstrated, probably severe fibroid uterus. FLUOROSCOPY TIME:  0 min 5 sec IMPRESSION: Left hip hemiarthroplasty with no adverse features. Electronically Signed   By: Genevie Ann M.D.   On: 03/25/2017 13:07   Mr Jodene Nam Head Wo Contrast  Result Date: 03/22/2017 CLINICAL DATA:  [place] 2 weeks ago. Gait disturbance. Slurred speech. Left facial droop. EXAM: MRI HEAD WITHOUT CONTRAST MRA HEAD WITHOUT CONTRAST TECHNIQUE: Multiplanar, multiecho pulse sequences of the brain and surrounding structures were obtained without intravenous contrast. Angiographic images of the head were obtained using MRA technique without contrast. COMPARISON:  CT 03/21/2017 FINDINGS: MRI HEAD FINDINGS Brain: 1.5 x 2 cm region of acute infarction in the posterior basal ganglia/posterior limb internal capsule/radiating white matter tracts on the left. No mass effect or hemorrhage. No other acute infarction. Elsewhere, there are extensive chronic small vessel ischemic changes throughout the pons. No focal cerebellar insult. Chronic small-vessel ischemic changes are present throughout the thalami, basal ganglia and hemispheric white matter. Scattered foci of hemosiderin deposition are present scattered throughout the brain related to the old small vessel insults. No evidence of large vessel territory infarction or mass lesion. No sign of acute hemorrhage. No hydrocephalus or extra-axial collection. Vascular: Major vessels at the base of the brain show flow. Skull and upper cervical spine: Negative Sinuses/Orbits: Clear/normal Other: None MRA HEAD FINDINGS Both internal carotid arteries are patent through the skull base and siphon regions. The anterior and middle cerebral vessels are patent without proximal stenosis. More  distal branch vessels show atherosclerotic irregularity and narrowing, particularly evident in the anterior cerebral arteries. Both vertebral arteries are patent to the basilar. No basilar stenosis. Posterior circulation branch vessels are patent. Pronounced atherosclerotic irregularity of the PCA branches. IMPRESSION: 1.5-2 cm acute infarction in the posterior left basal ganglia, posterior limb internal capsule and radiating white matter tracts. No mass effect or hemorrhage. Widespread chronic small-vessel ischemic changes elsewhere throughout the brain as outlined above. No large vessel occlusion or correctable proximal stenosis. Widespread intracranial medium to small vessel atherosclerotic narrowing and irregularity. Electronically Signed   By: Nelson Chimes M.D.   On: 03/22/2017 11:49   Dg Hip Operative Unilat W Or W/o Pelvis Left  Result Date: 03/25/2017 CLINICAL DATA:  83 year old female with left  femoral neck fracture undergoing ORIF. EXAM: OPERATIVE left HIP (WITH PELVIS IF PERFORMED) 2 VIEWS TECHNIQUE: Fluoroscopic spot image(s) were submitted for interpretation post-operatively. COMPARISON:  Preoperative radiographs 03/21/2017. FINDINGS: 2 intraoperative fluoroscopic AP spot views of the left hip demonstrate a proximal left femur hemiarthroplasty. The femoral head component appears normally aligned in the AP projection with the acetabulum. No unexpected osseous changes. Bulky calcified pelvic mass is redemonstrated, probably severe fibroid uterus. FLUOROSCOPY TIME:  0 min 5 sec IMPRESSION: Left hip hemiarthroplasty with no adverse features. Electronically Signed   By: Genevie Ann M.D.   On: 03/25/2017 13:07   Dg Femur Min 2 Views Left  Result Date: 03/21/2017 CLINICAL DATA:  Fall with pain EXAM: LEFT FEMUR 2 VIEWS COMPARISON:  None. FINDINGS: Acute displaced left femoral neck fracture with mild varus angulation. No femoral head dislocation. Partially visible calcified pelvic masses. Distal femur  appears intact. Degenerative changes at the knee IMPRESSION: Acute displaced left femoral neck fracture Electronically Signed   By: Donavan Foil M.D.   On: 03/21/2017 19:47    Labs:  CBC: Recent Labs    03/25/17 0654 03/26/17 0215 03/27/17 0429 03/28/17 0314  WBC 10.4 7.6 6.4 5.5  HGB 9.9* 10.7* 9.9* 9.6*  HCT 30.7* 32.6* 30.6* 29.9*  PLT 243 211 221 237    COAGS: Recent Labs    03/21/17 1950  INR 0.99  APTT 31    BMP: Recent Labs    03/25/17 0654 03/26/17 0215 03/27/17 0429 03/29/17 0917  NA 146* 140 139 143  K 3.4* 3.6 3.4* 3.7  CL 112* 109 109 109  CO2 23 21* 20* 24  GLUCOSE 150* 170* 178* 122*  BUN 23* 25* 30* 27*  CALCIUM 8.4* 7.8* 7.8* 8.4*  CREATININE 1.41* 1.41* 1.55* 1.18*  GFRNONAA 33* 33* 30* 41*  GFRAA 38* 38* 34* 48*    LIVER FUNCTION TESTS: Recent Labs    03/21/17 1950 03/25/17 0654 03/29/17 0917  BILITOT 1.0 1.3* 0.6  AST 36 45* 236*  ALT 18 30 115*  ALKPHOS 85 73 184*  PROT 8.2* 6.1* 6.2*  ALBUMIN 3.4* 2.2* 1.9*    TUMOR MARKERS: No results for input(s): AFPTM, CEA, CA199, CHROMGRNA in the last 8760 hours.  Assessment and Plan: Recent CVA Dysphagia Plan for perc G-tube placement Labs reviewed. Will hold Lovenox tonight. Stop TF at [place] Risks and benefits discussed with the patient including, but not limited to the need for a barium enema during the procedure, bleeding, infection, peritonitis, or damage to adjacent structures.  All of the patient's questions were answered, patient is agreeable to proceed. Consent signed and in chart.    Thank you for this interesting consult.  I greatly enjoyed meeting Velia Arnette and look forward to participating in their care.  A copy of this report was sent to the requesting provider on this date.  Electronically Signed: Ascencion Dike, PA-C 03/30/2017, 3:27 PM   I spent a total of 20 minutes in face to face in clinical consultation, greater than 50% of which was  counseling/coordinating care for G-tube placement

## 2017-03-30 NOTE — Progress Notes (Signed)
Pt  For peg insertion today in IR . No order for NPO but TF turned off at 0300 this am  Pt daughter aware .

## 2017-03-30 NOTE — Progress Notes (Addendum)
[facility] Progress Note  Patient: Alyssa Savage QQI:[id number]   PCP: Patient, No Pcp Per DOB: 03-24-1930   DOA: 03/21/2017   DOS: 03/30/2017   Date of Service: the patient was seen and examined on 03/30/2017  Subjective: Failed modified barium swallow now scheduled for PEG tube placement, 2 feet held at 3 AM, held lovenox this am . Discussed with RN  By the bedside  Hemodynamically stable overnight. Postop day 5 of hip arthroplasty. Plan is to go to SNF once swallowing issues resolve Denies chest pain, no shortness of breath, no fever or chills.         Brief hospital course: Pt. with no known PMH ; admitted on 03/21/2017, presented with complaint of fall, was found to have acute stroke as well as left hip fracture.  Neurology was consulted, stroke workup completed.     Orthopedic  consulted and patient underwent left hip hemiarthroplasty on 03/25/2017.   Pending PEG placement, initiation of tube feeds, then SNF  Assessment and Plan: Acute small vessel left basal ganglia stroke [facility]):   Head CT was performed which shows right basal ganglia stroke  MRI reveals:1.5-2 cm Acute infarction in the posterior LEFT basal ganglia, posterior limb internal capsule and radiating white matter tracts. Nightly small vessel disease with right-sided deficits which are improving, risk factors include diabetes mellitus, hyperlipidemia and hypertension Completed permissive HTN in the setting of acute stroke,  ASA and lipitor LDL 117, hemoglobin A1c 5.9. 2D transthoracic echocardiography 60-65% EF, no acute abnormality. Carotid Doppler? Do not see results , will order PT/OT  -recommends SNF SLP -failed modified barium swallow,  will need PEG Neurology recommends 325 mg of aspirin Consider 30 day cardiac event monitor at discharge, will arrange to rule out atrial fibrillation recommended by Dr. Leonie Man telemetry shows normal sinus rhythm Patient may need PEG tube placement if she for fails modified  barium swallow today.   Follow up with [facility] in 6 weeks   Fall and fracture of femoral neck, left, closed [facility]): Orthopedic surgeon, Dr.Swinteck performed left hip hemiarthroplasty on 2/22 - Pain control: morphine prn and percocet - When necessary Zofran for nausea - Robaxin for muscle spasm  deemed to have acceptable risk from a cardiological perspective, high risk from neurological perspective for worsening of her stroke. Discussed with orthopedic as well as daughter, they were  willing to accept the risk. She is status post  left hip hemiarthroplasty, day #5.  Remains stable will need to convert VAC to portable prevena unit upon d/c as per Dr Hilton Cork Swinteck    Likely acute kidney injury on chronic kidney disease stage III versus acute kidney injury:  Creatinine peaked at 1.71, has ranged between 1.4-1.57, repeat 1.18 -Continue to monitor.    Hypertensive emergency:  -prn hydralazine for SBP>220   may be need further outpatient workup for secondary hypertension including hyperaldosteronism/  renal artery stenosis   -We will continue to optimize for now. Continue Coreg,prn hydralazine    Elevated troponin: trop 0.10. No CP. Likely due to demand ischemia secondary to hypertension and acute stroke. Patient received preop clearance on 2/20 by cardiology - prn Nitroglycerin, Morphine, and aspirin, lipitor  - 2d echo shows no acute abnormality, preserved EF.  Troponins were mildly elevated during this admission without EKG changes   Hypokalemia: K= 2.0  on admission.Now  repleted     Hypomagnesemia: -Magnesium 1.7 , will start magnesium oxide per tube     GERD (gastroesophageal reflux disease) -protonix  Dysphagia. Patient sustained dysphagia post stroke. Unable to pass MBS. Cor Trac placed initially now scheduled for PEG. Started on tube feedings after PEG placement. Anticipate discharge in 2448 hrs.      DVT Prophylaxis: subcutaneous  Heparin Advance goals of care discussion: Full code  Family Communication: family was present at bedside, at the time of interview.  All questions answered.  Disposition:  Skilled nursing facility once feedings are established after PEG placement  consultants: Neurology, orthopedics, cardiology  Procedures: Echocardiogram LV EF: 60% -   65%  Antibiotics: Anti-infectives (From admission, onward)   Start     Dose/Rate Route Frequency Ordered Stop   03/26/17 0600  ceFAZolin (ANCEF) IVPB 2g/100 mL premix     2 g 200 mL/hr over 30 Minutes Intravenous To Surgery 03/25/17 1612 03/27/17 0600   03/26/17 0600  ceFAZolin (ANCEF) IVPB 2g/100 mL premix  Status:  Discontinued     2 g 200 mL/hr over 30 Minutes Intravenous On call to O.R. 03/25/17 1612 03/25/17 1616   03/25/17 1615  ceFAZolin (ANCEF) IVPB 2g/100 mL premix     2 g 200 mL/hr over 30 Minutes Intravenous Every 6 hours 03/25/17 1612 03/25/17 2230       Objective: Physical Exam: Vitals:   03/29/17 1617 03/29/17 2106 03/30/17 0038 03/30/17 0710  BP: (!) 148/76 (!) 157/79 (!) 154/77 (!) 166/83  Pulse: 80 86 83 85  Resp: 18 16 18 16   Temp: 98.6 F (37 C) 98.2 F (36.8 C) 98.2 F (36.8 C) (!) 97.1 F (36.2 C)  TempSrc: Axillary Oral Oral Oral  SpO2: 99% 96% 99% 98%  Weight:   49.2 kg (108 lb 7.5 oz)   Height:        Intake/Output Summary (Last 24 hours) at 03/30/2017 0928 Last data filed at 03/30/2017 0700 Gross per 24 hour  Intake 700 ml  Output 300 ml  Net 400 ml   Filed Weights   03/27/17 0200 03/28/17 0407 03/30/17 0038  Weight: 47.3 kg (104 lb 4.4 oz) 49.1 kg (108 lb 3.9 oz) 49.2 kg (108 lb 7.5 oz)   General: Alert, Awake and Oriented to Time, Place and [place]. Appear in mild distress, affect appropriate Eyes: PERRL, Conjunctiva normal ENT: Oral Mucosa clear moist. Neck: no JVD, no Abnormal Mass Or lumps Cardiovascular: S1 and S2 Present, no Murmur, Peripheral Pulses Present Respiratory: normal respiratory  effort, Bilateral Air entry equal and Decreased, no use of accessory muscle, Clear to Auscultation, no Crackles, no wheezes Abdomen: Bowel Sound present, Soft and no tenderness, no hernia Skin: no redness, no Rash, no induration Extremities: no Pedal edema, no calf tenderness Neurologic: Dysarthric.  Right-sided weakness.  Data Reviewed: CBC: Recent Labs  Lab 03/24/17 0414 03/25/17 0654 03/26/17 0215 03/27/17 0429 03/28/17 0314  WBC 8.8 10.4 7.6 6.4 5.5  NEUTROABS  --  9.0*  --   --   --   HGB 10.5* 9.9* 10.7* 9.9* 9.6*  HCT 32.6* 30.7* 32.6* 30.6* 29.9*  MCV 86.2 86.7 83.8 84.5 84.9  PLT 296 243 211 221 726   Basic Metabolic Panel: Recent Labs  Lab 03/24/17 0414 03/25/17 0654 03/26/17 0215 03/27/17 0429 03/29/17 0917  NA 144 146* 140 139 143  K 3.3* 3.4* 3.6 3.4* 3.7  CL 108 112* 109 109 109  CO2 25 23 21* 20* 24  GLUCOSE 149* 150* 170* 178* 122*  BUN 22* 23* 25* 30* 27*  CREATININE 1.45* 1.41* 1.41* 1.55* 1.18*  CALCIUM 8.7* 8.4* 7.8* 7.8* 8.4*  MG  --  1.6* 1.4* 1.9 1.7  PHOS  --  2.1* 2.4* 2.2*  --     Liver Function Tests: Recent Labs  Lab 03/25/17 0654 03/29/17 0917  AST 45* 236*  ALT 30 115*  ALKPHOS 73 184*  BILITOT 1.3* 0.6  PROT 6.1* 6.2*  ALBUMIN 2.2* 1.9*   No results for input(s): LIPASE, AMYLASE in the last 168 hours. No results for input(s): AMMONIA in the last 168 hours. Coagulation Profile: No results for input(s): INR, PROTIME in the last 168 hours. Cardiac Enzymes: No results for input(s): CKTOTAL, CKMB, CKMBINDEX, TROPONINI in the last 168 hours. BNP (last 3 results) No results for input(s): PROBNP in the last 8760 hours. CBG: Recent Labs  Lab 03/29/17 1616 03/29/17 2010 03/30/17 0017 03/30/17 0414 03/30/17 0827  GLUCAP 121* 101* 149* 139* 110*   Studies: Ct Abdomen Wo Contrast  Result Date: 03/29/2017 CLINICAL DATA:  Dysphagia, preop evaluation for gastrostomy tube EXAM: CT ABDOMEN WITHOUT CONTRAST TECHNIQUE:  Multidetector CT imaging of the abdomen was performed following the standard protocol without IV contrast. COMPARISON:  None. FINDINGS: Lower chest: Scattered coronary calcifications. Visualized lung bases clear. No pleural or pericardial effusion. Hepatobiliary: No focal liver abnormality is seen. No gallstones, gallbladder wall thickening, or biliary dilatation. Pancreas: Unremarkable. No pancreatic ductal dilatation or surrounding inflammatory changes. Spleen: Normal in size without focal abnormality. Adrenals/Urinary Tract: Probable bilateral renal cysts, incompletely characterized. No hydronephrosis. Stomach/Bowel: Feeding tube extends just across the pylorus. The stomach is nondilated. There is a safe percutaneous approach for gastrostomy placement. Mild gaseous distention of visualized loops of small bowel and colon. Vascular/Lymphatic: Minimal scattered aortic atheromatous calcifications without aneurysm. No abdominal adenopathy localized. Other: Lobular right pelvic mass with coarse peripherally calcified components probably myomatous uterus but incompletely characterized. No ascites. No free air. Musculoskeletal: No acute or significant osseous findings. IMPRESSION: 1. A safe percutaneous approach for gastrostomy placement is identified. 2. Coronary and Aortic Atherosclerosis (ICD10-170.0). 3. Probable myomatous uterus, incompletely characterized. Electronically Signed   By: Lucrezia Europe M.D.   On: 03/29/2017 13:40   Dg Swallowing Func-speech Pathology  Result Date: 03/29/2017 Objective Swallowing Evaluation: Type of Study: MBS-Modified Barium Swallow Study  Patient Details Name: Romelle Muldoon MRN: [id number] Date of Birth: 24-Aug-1930 Today's Date: 03/29/2017 Time: SLP Start Time (ACUTE ONLY): 1125 -SLP Stop Time (ACUTE ONLY): 1145 SLP Time Calculation (min) (ACUTE ONLY): 20 min Past Medical History: Past Medical History: Diagnosis Date . Femoral neck fracture ([place]) 03/2017  left  . GERD  (gastroesophageal reflux disease)  . Hypertensive emergency 03/2017 . Hypokalemia 03/2017 . Stroke [facility])  Past Surgical History: Past Surgical History: Procedure Laterality Date . ANTERIOR APPROACH HEMI HIP ARTHROPLASTY Left 03/25/2017  Procedure: ANTERIOR APPROACH HEMI HIP ARTHROPLASTY;  Surgeon: Rod Can, MD;  Location: [place];  Service: Orthopedics;  Laterality: Left; . NO PAST SURGERIES   HPI: 84 y.o.femalewho notes that she has had significant slurred speech since 03/21/17. Head CT was performed which shows right basal ganglia infarct; MRI shows Acute infarction in the posteriorLEFTbasal ganglia, posterior limb internal capsule and radiating white matter tracts.  Recent fall with left hip fracture.  Subjective: alert, cooperative, very talkative but unintelligible Assessment / Plan / Recommendation CHL IP CLINICAL IMPRESSIONS 03/29/2017 Clinical Impression Pt's oropharyngeal abilites don't appear changed over previous MBS on 03/23/17. During this study, pt with severe oropharyngeal dysphagia c/b delayed swallow initiation as bolus (honey and nectar by spoon) falls from vallucula to pyriform sinuses resulting in penetration before and during  swallow. Pt also demonstrate significant pharyngeal weakness resulting in widespread moderate to severe pharyngeal residue leading to trace aspiration of all consistencies. With Max A cues pt able to produce 6 to 7 consecutive swallows to decrease residue to mild residue. In addition to decreased safety with consumpiton, pt demonstrates decreased effeciency with PO consumption. she frequently struggled to breath, gasped, appeared strangled and required Max A cues to relax and swallow, breath. SLP recommends continued NPO status with consideration for long term alternative means of nutrition. SLP will follow for therapeutic exercise to include effortful swallows and CTAR to continue use of swallow musculature.  SLP Visit Diagnosis Dysphagia, oropharyngeal phase (R13.12)  Attention and concentration deficit following -- Frontal lobe and executive function deficit following -- Impact on safety and function Severe aspiration risk;Risk for inadequate nutrition/hydration   CHL IP TREATMENT RECOMMENDATION 03/29/2017 Treatment Recommendations Therapy as outlined in treatment plan below   Prognosis 03/29/2017 Prognosis for Safe Diet Advancement Fair Barriers to Reach Goals Severity of deficits Barriers/Prognosis Comment -- CHL IP DIET RECOMMENDATION 03/29/2017 SLP Diet Recommendations NPO;Alternative means - long-term Liquid Administration via -- Medication Administration Via alternative means Compensations -- Postural Changes Remain semi-upright after after feeds/meals (Comment)   CHL IP OTHER RECOMMENDATIONS 03/29/2017 Recommended Consults -- Oral Care Recommendations Oral care QID Other Recommendations Have oral suction available   CHL IP FOLLOW UP RECOMMENDATIONS 03/29/2017 Follow up Recommendations Skilled Nursing facility   [facility] IP FREQUENCY AND DURATION 03/29/2017 Speech Therapy Frequency (ACUTE ONLY) min 2x/week Treatment Duration 2 weeks      CHL IP ORAL PHASE 03/29/2017 Oral Phase Impaired Oral - Pudding Teaspoon -- Oral - Pudding Cup -- Oral - Honey Teaspoon Right anterior bolus loss Oral - Honey Cup -- Oral - Nectar Teaspoon Right anterior bolus loss Oral - Nectar Cup -- Oral - Nectar Straw -- Oral - Thin Teaspoon NT Oral - Thin Cup -- Oral - Thin Straw -- Oral - Puree NT Oral - Mech Soft -- Oral - Regular -- Oral - Multi-Consistency -- Oral - Pill -- Oral Phase - Comment --  CHL IP PHARYNGEAL PHASE 03/29/2017 Pharyngeal Phase Impaired Pharyngeal- Pudding Teaspoon -- Pharyngeal -- Pharyngeal- Pudding Cup -- Pharyngeal -- Pharyngeal- Honey Teaspoon Delayed swallow initiation-vallecula;Delayed swallow initiation-pyriform sinuses;Reduced pharyngeal peristalsis Pharyngeal -- Pharyngeal- Honey Cup -- Pharyngeal -- Pharyngeal- Nectar Teaspoon -- Pharyngeal -- Pharyngeal- Nectar Cup --  Pharyngeal -- Pharyngeal- Nectar Straw -- Pharyngeal -- Pharyngeal- Thin Teaspoon -- Pharyngeal -- Pharyngeal- Thin Cup -- Pharyngeal -- Pharyngeal- Thin Straw -- Pharyngeal -- Pharyngeal- Puree -- Pharyngeal -- Pharyngeal- Mechanical Soft -- Pharyngeal -- Pharyngeal- Regular -- Pharyngeal -- Pharyngeal- Multi-consistency -- Pharyngeal -- Pharyngeal- Pill -- Pharyngeal -- Pharyngeal Comment --  No flowsheet data found. No flowsheet data found. Happi Overton 03/29/2017, 1:45 PM               Scheduled Meds: . aspirin  300 mg Rectal Daily   Or  . aspirin  325 mg Oral Daily  . atorvastatin  40 mg Oral q1800  . carvedilol  3.125 mg Per Tube BID WC  . enoxaparin (LOVENOX) injection  30 mg Subcutaneous Q24H  . free water  200 mL Per Tube Q6H  . pantoprazole sodium  40 mg Per Tube QHS  . phosphorus  250 mg Oral BID  . potassium chloride  40 mEq Oral Once   Continuous Infusions: . feeding supplement (OSMOLITE 1.2 CAL) 1,000 mL (03/29/17 1026)  . lactated ringers 10 mL/hr at 03/25/17 1103  PRN Meds: acetaminophen **OR** acetaminophen (TYLENOL) oral liquid 160 mg/5 mL **OR** acetaminophen, hydrALAZINE, HYDROcodone-acetaminophen, hydrOXYzine, menthol-cetylpyridinium **OR** phenol, methocarbamol, morphine injection, nitroGLYCERIN, ondansetron **OR** ondansetron (ZOFRAN) IV, senna-docusate, zolpidem  Time spent: 25 minutes   .  03/30/2017 9:28 AM  If 7PM-7AM, please contact night-coverage at [email or web address], password [facility]

## 2017-03-31 ENCOUNTER — Inpatient Hospital Stay (HOSPITAL_COMMUNITY): Payer: Medicare Other

## 2017-03-31 ENCOUNTER — Encounter (HOSPITAL_COMMUNITY): Payer: Self-pay | Admitting: Interventional Radiology

## 2017-03-31 ENCOUNTER — Encounter (HOSPITAL_COMMUNITY): Payer: Medicare Other

## 2017-03-31 HISTORY — PX: IR GASTROSTOMY TUBE MOD SED: IMG625

## 2017-03-31 LAB — BLOOD GAS, ARTERIAL
Acid-Base Excess: 3.8 mmol/L — ABNORMAL HIGH (ref 0.0–2.0)
Bicarbonate: 27.2 mmol/L (ref 20.0–28.0)
DRAWN BY: 39899
FIO2: 21
O2 SAT: 93.9 %
Patient temperature: 99.1
pCO2 arterial: 37.2 mmHg (ref 32.0–48.0)
pH, Arterial: 7.479 — ABNORMAL HIGH (ref 7.350–7.450)
pO2, Arterial: 74 mmHg — ABNORMAL LOW (ref 83.0–108.0)

## 2017-03-31 LAB — GLUCOSE, CAPILLARY
GLUCOSE-CAPILLARY: 159 mg/dL — AB (ref 65–99)
GLUCOSE-CAPILLARY: 101 mg/dL — AB (ref 65–99)
GLUCOSE-CAPILLARY: 101 mg/dL — AB (ref 65–99)
GLUCOSE-CAPILLARY: 117 mg/dL — AB (ref 65–99)
GLUCOSE-CAPILLARY: 156 mg/dL — AB (ref 65–99)
Glucose-Capillary: 155 mg/dL — ABNORMAL HIGH (ref 65–99)
Glucose-Capillary: 161 mg/dL — ABNORMAL HIGH (ref 65–99)
Glucose-Capillary: 215 mg/dL — ABNORMAL HIGH (ref 65–99)

## 2017-03-31 LAB — BASIC METABOLIC PANEL
ANION GAP: 11 (ref 5–15)
BUN: 26 mg/dL — AB (ref 6–20)
CO2: 24 mmol/L (ref 22–32)
Calcium: 8.1 mg/dL — ABNORMAL LOW (ref 8.9–10.3)
Chloride: 107 mmol/L (ref 101–111)
Creatinine, Ser: 1.08 mg/dL — ABNORMAL HIGH (ref 0.44–1.00)
GFR calc Af Amer: 53 mL/min — ABNORMAL LOW (ref >60)
GFR calc non Af Amer: 46 mL/min — ABNORMAL LOW (ref >60)
GLUCOSE: 101 mg/dL — AB (ref 65–99)
POTASSIUM: 3.9 mmol/L (ref 3.5–5.1)
Sodium: 142 mmol/L (ref 135–145)

## 2017-03-31 LAB — CBC
HCT: 27.1 % — ABNORMAL LOW (ref 36.0–46.0)
Hemoglobin: 8.7 g/dL — ABNORMAL LOW (ref 12.0–15.0)
MCH: 27.4 pg (ref 26.0–34.0)
MCHC: 32.1 g/dL (ref 30.0–36.0)
MCV: 85.2 fL (ref 78.0–100.0)
PLATELETS: 288 10*3/uL (ref 150–400)
RBC: 3.18 MIL/uL — AB (ref 3.87–5.11)
RDW: 14.9 % (ref 11.5–15.5)
WBC: 5 10*3/uL (ref 4.0–10.5)

## 2017-03-31 LAB — PROTIME-INR
INR: 1.09
PROTHROMBIN TIME: 14 s (ref 11.4–15.2)

## 2017-03-31 MED ORDER — IOPAMIDOL (ISOVUE-300) INJECTION 61%
INTRAVENOUS | Status: AC
  Administered 2017-03-31: 20 mL
  Filled 2017-03-31: qty 50

## 2017-03-31 MED ORDER — MIDAZOLAM HCL 2 MG/2ML IJ SOLN
INTRAMUSCULAR | Status: AC | PRN
  Administered 2017-03-31: 0.5 mg via INTRAVENOUS
  Administered 2017-03-31: 1 mg via INTRAVENOUS

## 2017-03-31 MED ORDER — NALOXONE HCL 0.4 MG/ML IJ SOLN
INTRAMUSCULAR | Status: AC
  Filled 2017-03-31: qty 1

## 2017-03-31 MED ORDER — FENTANYL CITRATE (PF) 100 MCG/2ML IJ SOLN
INTRAMUSCULAR | Status: AC
  Filled 2017-03-31: qty 4

## 2017-03-31 MED ORDER — ENOXAPARIN SODIUM 30 MG/0.3ML ~~LOC~~ SOLN
30.0000 mg | SUBCUTANEOUS | Status: DC
  Administered 2017-04-01 – 2017-04-04 (×4): 30 mg via SUBCUTANEOUS
  Filled 2017-03-31 (×4): qty 0.3

## 2017-03-31 MED ORDER — MIDAZOLAM HCL 2 MG/2ML IJ SOLN
INTRAMUSCULAR | Status: AC
  Filled 2017-03-31: qty 4

## 2017-03-31 MED ORDER — LIDOCAINE HCL 1 % IJ SOLN
INTRAMUSCULAR | Status: AC
  Filled 2017-03-31: qty 20

## 2017-03-31 MED ORDER — LIDOCAINE HCL (PF) 1 % IJ SOLN
INTRAMUSCULAR | Status: AC | PRN
  Administered 2017-03-31: 10 mL

## 2017-03-31 MED ORDER — FENTANYL CITRATE (PF) 100 MCG/2ML IJ SOLN
INTRAMUSCULAR | Status: AC | PRN
  Administered 2017-03-31: 50 ug via INTRAVENOUS

## 2017-03-31 MED ORDER — SODIUM CHLORIDE 0.9 % IV SOLN
INTRAVENOUS | Status: AC | PRN
  Administered 2017-03-31: 10 mL/h via INTRAVENOUS

## 2017-03-31 MED ORDER — NALOXONE HCL 0.4 MG/ML IJ SOLN
0.4000 mg | INTRAMUSCULAR | Status: DC | PRN
  Administered 2017-03-31: 0.4 mg via INTRAVENOUS

## 2017-03-31 NOTE — Progress Notes (Signed)
Pt TF turned off at 0000 NPO for Peg placement in AM consent in chart, daughter at bedside  And aware.

## 2017-03-31 NOTE — Plan of Care (Signed)
  Education: Knowledge of General Education information will improve 03/31/2017 0201 - Progressing by Carin Hock I, RN   Clinical Measurements: Ability to maintain clinical measurements within normal limits will improve 03/31/2017 0201 - Progressing by Marcos Eke, RN   Health Behavior/Discharge Planning: Ability to manage health-related needs will improve 03/31/2017 0201 - Progressing by Marcos Eke, RN   Clinical Measurements: Will remain free from infection 03/31/2017 0201 - Progressing by Carin Hock I, RN   Clinical Measurements: Diagnostic test results will improve 03/31/2017 0201 - Progressing by Marcos Eke, RN

## 2017-03-31 NOTE — Progress Notes (Signed)
Physical Therapy Treatment Patient Details Name: Alyssa Savage MRN: [id number] DOB: [birth year] Today's Date: 03/31/2017    History of Present Illness 84 y.o. female with a displaced Left femoral neck fracture and acute embolic left-sided basal ganglia stroke. Underwent Left hip hemiarthroplasty, anterior approach 2/22. s/p PEG placement 2/28.    PT Comments    Patient feeling sore today after PEG placement but agreeable to therapy. Tolerated standing bouts in stedy focusing on midline positioning, WB through LLE and activating spine and hip extensors for upright. Pt with heavy right lateral lean. Pain in hip seems improved. Continues to require assist of 2 for all mobility. Will follow.    Follow Up Recommendations  SNF;Supervision/Assistance - 24 hour     Equipment Recommendations  None recommended by PT    Recommendations for Other Services       Precautions / Restrictions Precautions Precautions: Fall Precaution Comments: L ant hip wound vac Restrictions Weight Bearing Restrictions: Yes LLE Weight Bearing: Weight bearing as tolerated    Mobility  Bed Mobility Overal bed mobility: Needs Assistance Bed Mobility: Supine to Sit;Sit to Supine     Supine to sit: +2 for physical assistance;Max assist Sit to supine: Max assist;+2 for physical assistance   General bed mobility comments: Assist to move BLEs to EOB and elevate trunk; able to initiate movement but needs assist.   Transfers Overall transfer level: Needs assistance Equipment used: (2 [name] gait belt and pulling up on stedy) Transfers: Sit to/from Stand Sit to Stand: Max assist;+2 physical assistance         General transfer comment: Able to assist with pulling up with LUE; stood from elevated bed height x3. Right lateral lean. Declined transfer to chair secondary to being sore from PEG placement today.  Ambulation/Gait             General Gait Details: unable   Stairs             Wheelchair Mobility    Modified Rankin (Stroke Patients Only) Modified Rankin (Stroke Patients Only) Pre-Morbid Rankin Score: Slight disability Modified Rankin: Severe disability     Balance Overall balance assessment: Needs assistance Sitting-balance support: Feet supported;Single extremity supported Sitting balance-Leahy Scale: Poor Sitting balance - Comments: R lateral lean, worked on maintaining midline, able to maintain with min guard for seconds at a time but then fatigues with LOB towards right.  Postural control: Right lateral lean;Posterior lean Standing balance support: During functional activity;Single extremity supported Standing balance-Leahy Scale: Poor Standing balance comment: Requires Max A of 2 for static standing balance with heavy right lateral lean. Able to engage hip extensors and back extensors but not able to sustain. Needs help to keep RUE on stedy bar.                             Cognition Arousal/Alertness: Awake/alert Behavior During Therapy: WFL for tasks assessed/performed Overall Cognitive Status: Within Functional Limits for tasks assessed                                 General Comments: Able to follow multi step commands consistently.       Exercises Total Joint Exercises Ankle Circles/Pumps: Left;10 reps;Supine    General Comments General comments (skin integrity, edema, etc.): Daughter present during session.      Pertinent Vitals/Pain Pain Assessment: Faces Faces Pain Scale: Hurts a little bit Pain  Location: L hip with movement; PEG site Pain Descriptors / Indicators: Aching;Sore Pain Intervention(s): Monitored during session;Repositioned    Home Living                      Prior Function            PT Goals (current goals can now be found in the care plan section) Progress towards PT goals: Progressing toward goals(slowly)    Frequency    Min 4X/week      PT Plan Current plan  remains appropriate    Co-evaluation              AM-PAC PT "6 Clicks" Daily Activity  Outcome Measure  Difficulty turning over in bed (including adjusting bedclothes, sheets and blankets)?: Unable Difficulty moving from lying on back to sitting on the side of the bed? : Unable Difficulty sitting down on and standing up from a chair with arms (e.g., wheelchair, bedside commode, etc,.)?: Unable Help needed moving to and from a bed to chair (including a wheelchair)?: Total Help needed walking in hospital room?: Total Help needed climbing 3-5 steps with a railing? : Total 6 Click Score: 6    End of Session Equipment Utilized During Treatment: Gait belt Activity Tolerance: Patient tolerated treatment well;Patient limited by pain Patient left: in bed;with call bell/phone within reach;with bed alarm set;with family/visitor present Nurse Communication: Mobility status;Need for lift equipment PT Visit Diagnosis: Unsteadiness on feet (R26.81);Other abnormalities of gait and mobility (R26.89);Muscle weakness (generalized) (M62.81);Pain;Hemiplegia and hemiparesis;Difficulty in walking, not elsewhere classified (R26.2);Other symptoms and signs involving the nervous system (R29.898) Hemiplegia - Right/Left: Right Hemiplegia - dominant/non-dominant: Dominant Hemiplegia - caused by: Cerebral infarction Pain - Right/Left: Left Pain - part of body: Hip(abdomen)     Time: 4287-6811 PT Time Calculation (min) (ACUTE ONLY): 27 min  Charges:  $Therapeutic Activity: 23-37 mins                    G Codes:       Wray Kearns, PT, DPT [phone]     [name] 03/31/2017, 4:32 PM

## 2017-03-31 NOTE — Sedation Documentation (Signed)
Patient is resting comfortably. 

## 2017-03-31 NOTE — Procedures (Signed)
Interventional Radiology Procedure Note  Procedure: Percutaneous gastrostomy  Complications: None  Estimated Blood Loss: < 10 mL  Findings: 20 Fr bumper retention gastrostomy tube placed with tip in body of stomach.  OK to use in 24 hours.  Samantha Olivera T. [place] Yarbro, M.D Pager:  [phone]    

## 2017-03-31 NOTE — Progress Notes (Signed)
PT Cancellation Note  Patient Details Name: Mairyn Lenahan MRN: [id number] DOB: 09-09-1930   Cancelled Treatment:    Reason Eval/Treat Not Completed: Patient at procedure or test/unavailable   Hussein Macdougal A Orvill Coulthard 03/31/2017, 2:07 PM Wray Kearns, [place], DPT [phone]

## 2017-03-31 NOTE — Progress Notes (Signed)
[facility] Progress Note  Patient: Alyssa Savage IOM:[id number]   PCP: Patient, No Pcp Per DOB: 11/09/1930   DOA: 03/21/2017   DOS: 03/31/2017   Date of Service: the patient was seen and examined on 03/31/2017  Subjective: Obtunded after PEG tube placement today, have 0.4 of narcan and placed on continuous pulse ox  Hemodynamically stable overnight. Postop day 6 of hip arthroplasty. Pain seems to be controlled Denies chest pain, no shortness of breath, no fever or chills.         Brief hospital course:   admitted on 03/21/2017, presented with complaint of fall, was found to have acute stroke as well as left hip fracture.  Neurology was consulted, stroke workup completed.     Orthopedic  consulted and patient underwent left hip hemiarthroplasty on 03/25/2017.   Pending PEG placement 2/28 due to failed modified barium swallow, initiation of tube feeds ordered after PEG tube placement, may not be able to use PEG tube for several hours after placement. Initiate  and advance  tube feeds. Eventually DC to SNF in one to 2 days  Assessment and Plan: Acute small vessel left basal ganglia stroke [facility]):   Head CT was performed which shows right basal ganglia stroke  MRI reveals:1.5-2 cm Acute infarction in the posterior LEFT basal ganglia, posterior limb internal capsule and radiating white matter tracts. Nightly small vessel disease with right-sided deficits which are improving, risk factors include diabetes mellitus, hyperlipidemia and hypertension Completed permissive HTN in the setting of acute stroke,  ASA and lipitor LDL 117, hemoglobin A1c 5.9. 2D transthoracic echocardiography 60-65% EF, no acute abnormality. Carotid Doppler 2/21   , 1 -39% stenosis bilaterally PT/OT  -recommends SNF SLP -failed modified barium swallow,    PEG 2/28 Neurology recommends 325 mg of aspirin Consider 30 day cardiac event monitor at discharge, will arrange to rule out atrial fibrillation recommended by Dr.  Leonie Savage, notified cardiology [facility]) telemetry shows normal sinus rhythm Follow up with [facility] in 6 weeks   Fall and fracture of femoral neck, left, closed [facility]): Orthopedic surgeon, Alyssa Savage performed left hip hemiarthroplasty on 2/22 - Pain control: morphine prn and percocet - When necessary Zofran for nausea - Robaxin for muscle spasm  deemed to have acceptable risk from a cardiac perspective, high risk from neurological perspective for worsening of her stroke. Discussed with orthopedic as well as daughter, they were  willing to accept the risk. She is status post  left hip hemiarthroplasty, day #6.  Remains stable will need to convert VAC to portable prevena unit upon d/c as per Dr Alyssa Savage    Likely acute kidney injury on chronic kidney disease stage III versus acute kidney injury:  Creatinine peaked at 1.71, has ranged between 1.4-1.57, repeat 1.08 -Continue to monitor.    Hypertensive emergency:  -prn hydralazine for SBP>220   may be need further outpatient workup for secondary hypertension including hyperaldosteronism/  renal artery stenosis   -We will continue to optimize for now. Continue Coreg,prn hydralazine    Elevated troponin: trop 0.10. No CP. Likely due to demand ischemia secondary to hypertension and acute stroke. Patient received preop clearance on 2/20 by cardiology - prn Nitroglycerin, Morphine, and aspirin, lipitor  - 2d echo shows no acute abnormality, preserved EF.  Troponins were mildly elevated during this admission without EKG changes   Hypokalemia: K= 2.0  on admission.Now  repleted 3.9    Hypomagnesemia: -Magnesium 1.7 ,   started magnesium oxide  GERD (gastroesophageal reflux disease) -protonix  Dysphagia. Patient sustained dysphagia post stroke. Unable to pass MBS. Cor Trac placed initially now has a  PEG. Start  tube feedings after 24 hrs through  PEG , continue cor trak TF  Anticipate discharge in  24-48 hrs.      DVT Prophylaxis: subcutaneous Heparin Advance goals of care discussion: Full code  Family Communication: family was present at bedside, at the time of interview.  All questions answered.  Disposition:  Skilled nursing facility once feedings are established after PEG placement  consultants: Neurology, orthopedics, cardiology  Procedures: Echocardiogram LV EF: 60% -   65%  Antibiotics: Anti-infectives (From admission, onward)   Start     Dose/Rate Route Frequency Ordered Stop   03/31/17 0700  ceFAZolin (ANCEF) IVPB 2g/100 mL premix     2 g 200 mL/hr over 30 Minutes Intravenous To Surgery 03/30/17 1618 04/01/17 0700   03/26/17 0600  ceFAZolin (ANCEF) IVPB 2g/100 mL premix     2 g 200 mL/hr over 30 Minutes Intravenous To Surgery 03/25/17 1612 03/27/17 0600   03/26/17 0600  ceFAZolin (ANCEF) IVPB 2g/100 mL premix  Status:  Discontinued     2 g 200 mL/hr over 30 Minutes Intravenous On call to O.R. 03/25/17 1612 03/25/17 1616   03/25/17 1615  ceFAZolin (ANCEF) IVPB 2g/100 mL premix     2 g 200 mL/hr over 30 Minutes Intravenous Every 6 hours 03/25/17 1612 03/25/17 2230       Objective: Physical Exam: Vitals:   03/30/17 1947 03/31/17 0020 03/31/17 0422 03/31/17 0800  BP: 108/73 116/78 113/75 128/83  Pulse: 90 78 89 95  Resp: 18 18 18 16   Temp: 98.8 F (37.1 C) 98.8 F (37.1 C) 98 F (36.7 C) 99.1 F (37.3 C)  TempSrc: Oral Oral Oral Oral  SpO2: 99% 99% 100% 97%  Weight:   49 kg (108 lb 0.4 oz)   Height:        Intake/Output Summary (Last 24 hours) at 03/31/2017 0847 Last data filed at 03/31/2017 0000 Gross per 24 hour  Intake 518.33 ml  Output 0 ml  Net 518.33 ml   Filed Weights   03/28/17 0407 03/30/17 0038 03/31/17 0422  Weight: 49.1 kg (108 lb 3.9 oz) 49.2 kg (108 lb 7.5 oz) 49 kg (108 lb 0.4 oz)   General: Alert, Awake and Oriented to Time, Place and [name]. Appear in mild distress, affect appropriate Eyes: PERRL, Conjunctiva normal ENT: Oral  Mucosa clear moist. Neck: no JVD, no Abnormal Mass Or lumps Cardiovascular: S1 and S2 Present, no Murmur, Peripheral Pulses Present Respiratory: normal respiratory effort, Bilateral Air entry equal and Decreased, no use of accessory muscle, Clear to Auscultation, no Crackles, no wheezes Abdomen: Bowel Sound present, Soft and no tenderness, no hernia Skin: no redness, no Rash, no induration Extremities: no Pedal edema, no calf tenderness Neurologic: Dysarthric.  Right-sided weakness.  Data Reviewed: CBC: Recent Labs  Lab 03/25/17 0654 03/26/17 0215 03/27/17 0429 03/28/17 0314  WBC 10.4 7.6 6.4 5.5  NEUTROABS 9.0*  --   --   --   HGB 9.9* 10.7* 9.9* 9.6*  HCT 30.7* 32.6* 30.6* 29.9*  MCV 86.7 83.8 84.5 84.9  PLT 243 211 221 191   Basic Metabolic Panel: Recent Labs  Lab 03/25/17 0654 03/26/17 0215 03/27/17 0429 03/29/17 0917 03/31/17 0515  NA 146* 140 139 143 142  K 3.4* 3.6 3.4* 3.7 3.9  CL 112* 109 109 109 107  CO2 23 21* 20* 24  24  GLUCOSE 150* 170* 178* 122* 101*  BUN 23* 25* 30* 27* 26*  CREATININE 1.41* 1.41* 1.55* 1.18* 1.08*  CALCIUM 8.4* 7.8* 7.8* 8.4* 8.1*  MG 1.6* 1.4* 1.9 1.7  --   PHOS 2.1* 2.4* 2.2*  --   --     Liver Function Tests: Recent Labs  Lab 03/25/17 0654 03/29/17 0917  AST 45* 236*  ALT 30 115*  ALKPHOS 73 184*  BILITOT 1.3* 0.6  PROT 6.1* 6.2*  ALBUMIN 2.2* 1.9*   No results for input(s): LIPASE, AMYLASE in the last 168 hours. No results for input(s): AMMONIA in the last 168 hours. Coagulation Profile: No results for input(s): INR, PROTIME in the last 168 hours. Cardiac Enzymes: No results for input(s): CKTOTAL, CKMB, CKMBINDEX, TROPONINI in the last 168 hours. BNP (last 3 results) No results for input(s): PROBNP in the last 8760 hours. CBG: Recent Labs  Lab 03/30/17 1712 03/30/17 1938 03/30/17 2325 03/31/17 0342 03/31/17 0840  GLUCAP 165* 159* 161* 101* 101*   Studies: No results found.  Scheduled Meds: . aspirin  300  mg Rectal Daily   Or  . aspirin  325 mg Oral Daily  . atorvastatin  40 mg Oral q1800  . carvedilol  3.125 mg Per Tube BID WC  . [START ON 04/01/2017] enoxaparin (LOVENOX) injection  30 mg Subcutaneous Q24H  . free water  200 mL Per Tube Q6H  . magnesium oxide  400 mg Per Tube BID  . pantoprazole sodium  40 mg Per Tube QHS  . potassium chloride  40 mEq Oral Once   Continuous Infusions: .  ceFAZolin (ANCEF) IV    . feeding supplement (OSMOLITE 1.2 CAL) Stopped (03/31/17 0000)  . lactated ringers 10 mL/hr at 03/31/17 0311   PRN Meds: acetaminophen **OR** acetaminophen (TYLENOL) oral liquid 160 mg/5 mL **OR** acetaminophen, hydrALAZINE, HYDROcodone-acetaminophen, hydroxypropyl methylcellulose / hypromellose, hydrOXYzine, menthol-cetylpyridinium **OR** phenol, methocarbamol, morphine injection, nitroGLYCERIN, ondansetron **OR** ondansetron (ZOFRAN) IV, senna-docusate, zolpidem  Time spent: 25 minutes   [facility] Pager: [phone]   03/31/2017 8:47 AM  If 7PM-7AM, please contact night-coverage at [email or web address], password [facility]

## 2017-04-01 LAB — GLUCOSE, CAPILLARY
GLUCOSE-CAPILLARY: 171 mg/dL — AB (ref 65–99)
GLUCOSE-CAPILLARY: 144 mg/dL — AB (ref 65–99)
Glucose-Capillary: 196 mg/dL — ABNORMAL HIGH (ref 65–99)
Glucose-Capillary: 163 mg/dL — ABNORMAL HIGH (ref 65–99)
Glucose-Capillary: 140 mg/dL — ABNORMAL HIGH (ref 65–99)

## 2017-04-01 LAB — CREATININE, SERUM
Creatinine, Ser: 1.26 mg/dL — ABNORMAL HIGH (ref 0.44–1.00)
GFR calc Af Amer: 44 mL/min — ABNORMAL LOW (ref >60)
GFR calc non Af Amer: 38 mL/min — ABNORMAL LOW (ref >60)

## 2017-04-01 LAB — CBC
HEMATOCRIT: 25.6 % — AB (ref 36.0–46.0)
HEMOGLOBIN: 8.3 g/dL — AB (ref 12.0–15.0)
MCH: 27.8 pg (ref 26.0–34.0)
MCHC: 32.4 g/dL (ref 30.0–36.0)
MCV: 85.6 fL (ref 78.0–100.0)
Platelets: 293 10*3/uL (ref 150–400)
RBC: 2.99 MIL/uL — AB (ref 3.87–5.11)
RDW: 14.9 % (ref 11.5–15.5)
WBC: 7.9 10*3/uL (ref 4.0–10.5)

## 2017-04-01 NOTE — Progress Notes (Signed)
CSW confirmed with daughter that the patient's preference for facility would be [facility]. CSW confirmed with [facility] that patient would have a bed to admit tomorrow.   CSW will continue to follow.  Laveda Abbe, [place] Clinical Social Worker [phone]

## 2017-04-01 NOTE — Progress Notes (Signed)
Physical Therapy Treatment Patient Details Name: Alyssa Savage MRN: [id number] DOB: January 30, 1931 Today's Date: 04/01/2017    History of Present Illness 84 y.o. female with a displaced Left femoral neck fracture and acute embolic left-sided basal ganglia stroke. Underwent Left hip hemiarthroplasty, anterior approach 2/22. s/p PEG placement 2/28.    PT Comments    Patient progressing slowly with mobility, but improved with R hip extension activation in hooklying.  MD in during session to remove wound vac.  Continues to be appropriate for SNF level rehab at d/c.   Follow Up Recommendations  SNF;Supervision/Assistance - 24 hour     Equipment Recommendations  None recommended by PT    Recommendations for Other Services       Precautions / Restrictions Precautions Precautions: Fall Restrictions LLE Weight Bearing: Weight bearing as tolerated    Mobility  Bed Mobility Overal bed mobility: Needs Assistance Bed Mobility: Supine to Sit     Supine to sit: +2 for physical assistance;Max assist     General bed mobility comments: assist for bringing legs off bed and to lift trunk, initiated and attempted to have her help with scooting hips with both legs on bed  Transfers Overall transfer level: Needs assistance Equipment used: 1 Tash hand held assist Transfers: Sit to/from Stand;Stand Pivot Transfers Sit to Stand: Max assist;+2 physical assistance Stand pivot transfers: +2 physical assistance;Total assist       General transfer comment: pulled up with L UE, but noted pushing to R with moving L foot further L when attempted to assist with L lateral weight shift; pivot to chair max to total A +2 to R   Ambulation/Gait                 Stairs            Wheelchair Mobility    Modified Rankin (Stroke Patients Only) Modified Rankin (Stroke Patients Only) Pre-Morbid Rankin Score: Slight disability Modified Rankin: Severe disability     Balance Overall  balance assessment: Needs assistance Sitting-balance support: Feet supported;Single extremity supported Sitting balance-Leahy Scale: Poor Sitting balance - Comments: R lateral lean with pushing L foot out to L with poor midline awareness, EOB with at least min A Postural control: Posterior lean;Right lateral lean   Standing balance-Leahy Scale: Zero Standing balance comment: standing for few seconds with R knee and hip blocked and L UE gripping tech pushing L foot out to side                            Cognition Arousal/Alertness: Awake/alert Behavior During Therapy: WFL for tasks assessed/performed Overall Cognitive Status: Within Functional Limits for tasks assessed                                        Exercises Total Joint Exercises Ankle Circles/Pumps: PROM;AROM;10 reps;Supine;Both Short Arc Quad: AROM;10 reps;Left;Supine Heel Slides: AROM;AAROM;Both;Supine;10 reps Other Exercises Other Exercises: able to attempt half bridge on R in supine with some activation of extensors    General Comments        Pertinent Vitals/Pain Faces Pain Scale: Hurts a little bit Pain Location: L hip with exercise Pain Descriptors / Indicators: Aching;Sore Pain Intervention(s): Monitored during session;Repositioned    Home Living                      Prior  Function            PT Goals (current goals can now be found in the care plan section) Progress towards PT goals: Progressing toward goals    Frequency    Min 4X/week      PT Plan Current plan remains appropriate    Co-evaluation              AM-PAC PT "6 Clicks" Daily Activity  Outcome Measure  Difficulty turning over in bed (including adjusting bedclothes, sheets and blankets)?: Unable Difficulty moving from lying on back to sitting on the side of the bed? : Unable Difficulty sitting down on and standing up from a chair with arms (e.g., wheelchair, bedside commode, etc,.)?:  Unable Help needed moving to and from a bed to chair (including a wheelchair)?: Total Help needed walking in hospital room?: Total Help needed climbing 3-5 steps with a railing? : Total 6 Click Score: 6    End of Session Equipment Utilized During Treatment: Gait belt Activity Tolerance: Patient limited by fatigue Patient left: with call bell/phone within reach;in chair;with chair alarm set;with family/visitor present Nurse Communication: Mobility status PT Visit Diagnosis: Muscle weakness (generalized) (M62.81);Hemiplegia and hemiparesis;Difficulty in walking, not elsewhere classified (R26.2) Hemiplegia - Right/Left: Right Hemiplegia - dominant/non-dominant: Dominant Hemiplegia - caused by: Cerebral infarction     Time: 1350-1410 PT Time Calculation (min) (ACUTE ONLY): 20 min  Charges:  $Therapeutic Activity: 8-22 mins                    G CodesMagda Kiel, [place] [phone] 04/01/2017    Alyssa Savage 04/01/2017, 3:40 PM

## 2017-04-01 NOTE — Progress Notes (Signed)
Occupational Therapy Treatment Patient Details Name: Kayia Billinger MRN: [id number] DOB: 1931-01-20 Today's Date: 04/01/2017    History of present illness 84 y.o. female with a displaced Left femoral neck fracture and acute embolic left-sided basal ganglia stroke. Underwent Left hip hemiarthroplasty, anterior approach 2/22. s/p PEG placement 2/28.   OT comments  Pt moved to EOB with max A - she was able to use Rt LE to half bridge with mod facilitation.  Worked on EOB sitting - she requires min - mod A, but maintains Rt and posterior lean with thoracic and lumbar flexion.  Worked on forward excursion of trunk to facilitation anterior weight shift as well as trunk extension.   She required max a to return to bed. Will continue to follow.   Follow Up Recommendations  SNF;Supervision/Assistance - 24 hour    Equipment Recommendations  Other (comment)    Recommendations for Other Services      Precautions / Restrictions Precautions Precautions: Fall Restrictions Weight Bearing Restrictions: Yes LLE Weight Bearing: Weight bearing as tolerated       Mobility Bed Mobility Overal bed mobility: Needs Assistance Bed Mobility: Supine to Sit;Sit to Supine     Supine to sit: Max assist Sit to supine: Max assist   General bed mobility comments: Pt requires assist to move LEs to EOB and to lift trunk and to scoot hips to EOB   Transfers Overall transfer level: Needs assistance Equipment used: 1 [place] hand held assist Transfers: Sit to/from [facility] Sit to Stand: Max assist;+2 physical assistance Stand pivot transfers: +2 physical assistance;Total assist       General transfer comment: did not attempt due to pt fatigue     Balance Overall balance assessment: Needs assistance Sitting-balance support: Feet supported;Single extremity supported Sitting balance-Leahy Scale: Poor Sitting balance - Comments: Pt requires mod A and UE support.  She maintains thoracic  and lumbar flexion with Rt lateral and posterior lean.  Worked on shifting to [employer] and forward.  pt able to initiate small excursions of lumbar extension with mod facilitation  Postural control: Posterior lean;Right lateral lean   Standing balance-Leahy Scale: Zero Standing balance comment: standing for few seconds with R knee and hip blocked and L UE gripping tech pushing L foot out to side                           ADL either performed or assessed with clinical judgement   ADL                                               Vision       Perception     Praxis      Cognition Arousal/Alertness: Awake/alert Behavior During Therapy: WFL for tasks assessed/performed Overall Cognitive Status: Within Functional Limits for tasks assessed(for tasks assessed )                                          Exercises Total Joint Exercises Ankle Circles/Pumps: PROM;AROM;10 reps;Supine;Both Short Arc Quad: AROM;10 reps;Left;Supine Heel Slides: AROM;AAROM;Both;Supine;10 reps Other Exercises Other Exercises: able to attempt half bridge on R in supine with some activation of extensors   Shoulder Instructions  General Comments      Pertinent Vitals/ Pain       Pain Assessment: Faces Faces Pain Scale: Hurts little more Pain Location: Lt hip  Pain Descriptors / Indicators: Sore Pain Intervention(s): Repositioned  Home Living                                          Prior Functioning/Environment              Frequency  Min 2X/week        Progress Toward Goals  OT Goals(current goals can now be found in the care plan section)  Progress towards OT goals: Progressing toward goals     Plan Discharge plan remains appropriate    Co-evaluation                 AM-PAC PT "6 Clicks" Daily Activity     Outcome Measure   Help from another [place] eating meals?: Total Help from another [place] taking care  of personal grooming?: A Lot Help from another [place] toileting, which includes using toliet, bedpan, or urinal?: A Lot Help from another [place] bathing (including washing, rinsing, drying)?: A Lot Help from another [place] to put on and taking off regular upper body clothing?: A Lot Help from another [place] to put on and taking off regular lower body clothing?: A Lot 6 Click Score: 11    End of Session    OT Visit Diagnosis: Other abnormalities of gait and mobility (R26.89);Muscle weakness (generalized) (M62.81);Other symptoms and signs involving cognitive function;Pain;Hemiplegia and hemiparesis Hemiplegia - Right/Left: Right Hemiplegia - dominant/non-dominant: Dominant Hemiplegia - caused by: Cerebral infarction Pain - Right/Left: Left Pain - part of body: Hip   Activity Tolerance Patient tolerated treatment well   Patient Left in bed;with call bell/phone within reach;with bed alarm set   Nurse Communication          Time: 1749-1810 OT Time Calculation (min): 21 min  Charges: OT General Charges $OT Visit: 1 Visit OT Treatments $Neuromuscular Re-education: 8-22 mins  [facility], OTR/L [phone]    Lucille Passy M 04/01/2017, 6:52 PM

## 2017-04-01 NOTE — Progress Notes (Signed)
[facility] Progress Note  Patient: Alyssa Savage AYT:[id number]   PCP: Patient, No Pcp Per DOB: Jun 11, 1930   DOA: 03/21/2017   DOS: 04/01/2017   Date of Service: the patient was seen and examined on 04/01/2017  Subjective: Patient doing well this morning able to answer questions appropriately and follow commands. Daughter at bedside. Hemodynamically stable overnight. Postop day 7 of hip arthroplasty. Pain seems to be controlled denies chest pain, no shortness of breath, no fever or chills.       Brief hospital course:  Patient was admitted on 03/21/2017, presented with complaint of fall, was found to have acute stroke as well as left hip fracture.  Neurology was consulted, stroke workup completed. Orthopedic  consulted and patient underwent left hip hemiarthroplasty on 03/25/2017.  PEG placed on 2/28  By IR due to failed modified barium swallow. Initiate  and advance  tube feeds.   Assessment and Plan: Acute small vessel left basal ganglia stroke [facility]):   Head CT was performed which shows right basal ganglia stroke  MRI reveals:1.5-2 cm Acute infarction in the posterior LEFT basal ganglia, posterior limb internal capsule and radiating white matter tracts. Nightly small vessel disease with right-sided deficits which are improving, risk factors include diabetes mellitus, hyperlipidemia and hypertension Completed permissive HTN in the setting of acute stroke,  ASA and lipitor LDL 117, hemoglobin A1c 5.9. 2D transthoracic echocardiography 60-65% EF, no acute abnormality. Carotid Doppler 2/21   , 1 -39% stenosis bilaterally PT/OT  -recommends SNF SLP -failed modified barium swallow,    PEG  Placed on 2/28 Neurology recommends 325 mg of aspirin Consider 30 day cardiac event monitor at discharge, will arrange to rule out atrial fibrillation recommended by Dr. Leonie Man, notified cardiology [facility]) Follow up with [facility] in 6 weeks   Fall and fracture of femoral neck, left,  closed [facility]): Orthopedic surgeon, Dr.Swinteck performed left hip hemiarthroplasty on 2/22 - Pain control: morphine prn and percocet - When necessary Zofran for nausea - Robaxin for muscle spasm  deemed to have acceptable risk from a cardiac perspective, high risk from neurological perspective for worsening of her stroke. Discussed with orthopedic as well as daughter, they were  willing to accept the risk. She is status post  left hip hemiarthroplasty, day #6.  Remains stable will need to convert VAC to portable prevena unit upon d/c as per Dr Hilton Cork Swinteck    Likely acute kidney injury on chronic kidney disease stage III versus acute kidney injury:  Creatinine peaked at 1.71, has ranged between 1.4-1.57, repeat 1.08>>1.26 -Follow up on BMP   Hypertensive emergency:  -prn hydralazine for SBP>220   may be need further outpatient workup for secondary hypertension including hyperaldosteronism/ renal artery stenosis   -We will continue to optimize for now. Continue Coreg, prn hydralazine   Elevated troponin: trop 0.10. No CP. Likely due to demand ischemia secondary to hypertension and acute stroke. Patient received preop clearance on 2/20 by cardiology. - prn Nitroglycerin, Morphine, and aspirin, lipitor  - 2d echo shows no acute abnormality, preserved EF.  Hypokalemia: K= 2.0  on admission. Now repleted 3.9   Hypomagnesemia: -Magnesium 1.7 , started magnesium oxide    GERD (gastroesophageal reflux disease) -protonix  Dysphagia. Patient sustained dysphagia post stroke. Unable to pass MBS. Cor Trac placed initially now has a  PEG. Discontinue cor trak TF today Start feed with PEG Anticipate discharge in 24 hrs    DVT Prophylaxis: subcutaneous Heparin Advance goals of care discussion: Full  code  Family Communication: family was present at bedside, at the time of interview.  All questions answered.  Disposition:  Skilled nursing facility once feedings are established after  PEG placement  consultants: Neurology, orthopedics, cardiology  Procedures: Echocardiogram LV EF: 60% -   65%  Antibiotics: Anti-infectives (From admission, onward)   Start     Dose/Rate Route Frequency Ordered Stop   03/31/17 0700  ceFAZolin (ANCEF) IVPB 2g/100 mL premix     2 g 200 mL/hr over 30 Minutes Intravenous To Surgery 03/30/17 1618 03/31/17 1024   03/26/17 0600  ceFAZolin (ANCEF) IVPB 2g/100 mL premix     2 g 200 mL/hr over 30 Minutes Intravenous To Surgery 03/25/17 1612 03/27/17 0600   03/26/17 0600  ceFAZolin (ANCEF) IVPB 2g/100 mL premix  Status:  Discontinued     2 g 200 mL/hr over 30 Minutes Intravenous On call to O.R. 03/25/17 1612 03/25/17 1616   03/25/17 1615  ceFAZolin (ANCEF) IVPB 2g/100 mL premix     2 g 200 mL/hr over 30 Minutes Intravenous Every 6 hours 03/25/17 1612 03/25/17 2230       Objective: Physical Exam: Vitals:   03/31/17 2027 04/01/17 0002 04/01/17 0443 04/01/17 0838  BP: 138/78 (!) 138/58 (!) 148/67 (!) 147/74  Pulse: 94 93 63 94  Resp: 18 18 18 16   Temp: 98.2 F (36.8 C) 98 F (36.7 C) 98.1 F (36.7 C) 98.6 F (37 C)  TempSrc: Oral Oral Oral Oral  SpO2: 96% 98% 97% 97%  Weight:   102 lb 15.3 oz (46.7 kg)   Height:        Intake/Output Summary (Last 24 hours) at 04/01/2017 0943 Last data filed at 03/31/2017 1400 Gross per 24 hour  Intake 160 ml  Output -  Net 160 ml   Filed Weights   03/30/17 0038 03/31/17 0422 04/01/17 0443  Weight: 108 lb 7.5 oz (49.2 kg) 108 lb 0.4 oz (49 kg) 102 lb 15.3 oz (46.7 kg)   General: Alert, Awake and Oriented to Time, Place and [place]. Appear in mild distress, affect appropriate Eyes: PERRL, Conjunctiva normal ENT: Oral Mucosa clear moist. Neck: no JVD, no Abnormal Mass Or lumps Cardiovascular: S1 and S2 Present, no Murmur, Peripheral Pulses Present Respiratory: normal respiratory effort, Bilateral Air entry equal and Decreased, no use of accessory muscle, Clear to Auscultation, no Crackles, no  wheezes Abdomen: Bowel Sound present, Soft and no tenderness, no hernia Skin: no redness, no Rash, no induration Extremities: no Pedal edema, no calf tenderness Neurologic: Dysarthric.  Right-sided weakness.  Data Reviewed: CBC: Recent Labs  Lab 03/26/17 0215 03/27/17 0429 03/28/17 0314 03/31/17 0749 04/01/17 0714  WBC 7.6 6.4 5.5 5.0 7.9  HGB 10.7* 9.9* 9.6* 8.7* 8.3*  HCT 32.6* 30.6* 29.9* 27.1* 25.6*  MCV 83.8 84.5 84.9 85.2 85.6  PLT 211 221 237 288 007   Basic Metabolic Panel: Recent Labs  Lab 03/26/17 0215 03/27/17 0429 03/29/17 0917 03/31/17 0515 04/01/17 0714  NA 140 139 143 142  --   K 3.6 3.4* 3.7 3.9  --   CL 109 109 109 107  --   CO2 21* 20* 24 24  --   GLUCOSE 170* 178* 122* 101*  --   BUN 25* 30* 27* 26*  --   CREATININE 1.41* 1.55* 1.18* 1.08* 1.26*  CALCIUM 7.8* 7.8* 8.4* 8.1*  --   MG 1.4* 1.9 1.7  --   --   PHOS 2.4* 2.2*  --   --   --  Liver Function Tests: Recent Labs  Lab 03/29/17 0917  AST 236*  ALT 115*  ALKPHOS 184*  BILITOT 0.6  PROT 6.2*  ALBUMIN 1.9*   No results for input(s): LIPASE, AMYLASE in the last 168 hours. No results for input(s): AMMONIA in the last 168 hours. Coagulation Profile: Recent Labs  Lab 03/31/17 0749  INR 1.09   Cardiac Enzymes: No results for input(s): CKTOTAL, CKMB, CKMBINDEX, TROPONINI in the last 168 hours. BNP (last 3 results) No results for input(s): PROBNP in the last 8760 hours. CBG: Recent Labs  Lab 03/31/17 1619 03/31/17 1957 03/31/17 2353 04/01/17 0430 04/01/17 0819  GLUCAP 156* 155* 215* 196* 171*   Studies: Ir Gastrostomy Tube Mod Sed  Result Date: 03/31/2017 CLINICAL DATA:  Stroke and dysphagia. Need for percutaneous gastrostomy tube for nutrition. EXAM: PERCUTANEOUS GASTROSTOMY TUBE PLACEMENT ANESTHESIA/SEDATION: 1.5 mg IV Versed; 50 mcg IV Fentanyl. Total Moderate Sedation Time 10 minutes. The patient's level of consciousness and physiologic status were continuously  monitored during the procedure by Radiology nursing. CONTRAST:  10 mL Isovue-300 MEDICATIONS: 2 g IV Ancef. IV antibiotic was administered in an appropriate time interval prior to needle puncture of the skin. FLUOROSCOPY TIME:  1 minute and 42 seconds.  3.2 mGy. PROCEDURE: The procedure, risks, benefits, and alternatives were explained to the patient's daughter. Questions regarding the procedure were encouraged and answered. The patient's daughter understands and consents to the procedure. The evening prior to the procedure, the patient was given thin liquid barium to ingest in order to opacify the colon. A 5-French catheter was then advanced through the patient's mouth under fluoroscopy into the esophagus and to the level of the stomach. This catheter was used to insufflate the stomach with air under fluoroscopy. The abdominal wall was prepped with Betadine in a sterile fashion, and a sterile drape was applied covering the operative field. A sterile gown and sterile gloves were used for the procedure. Local anesthesia was provided with 1% Lidocaine. A skin incision was made in the upper abdominal wall. Under fluoroscopy, an 18 gauge trocar needle was advanced into the stomach. Contrast injection was performed to confirm intraluminal position of the needle tip. A single T tack was then deployed in the lumen of the stomach. This was brought up to tension at the skin surface. Over a guidewire, a 9-French sheath was advanced into the lumen of the stomach. The wire was left in place as a safety wire. A loop snare device from a percutaneous gastrostomy kit was then advanced into the stomach. A floppy guide wire was advanced through the orogastric catheter under fluoroscopy in the stomach. The loop snare advanced through the percutaneous gastric access was used to snare the guide wire. This allowed withdrawal of the loop snare out of the patient's mouth by retraction of the orogastric catheter and wire. A 20-French bumper  retention gastrostomy tube was looped around the snare device. It was then pulled back through the patient's mouth. The retention bumper was brought up to the anterior gastric wall. The T tack suture was cut at the skin. The exiting gastrostomy tube was cut to appropriate length and a feeding adapter applied. The catheter was injected with contrast material to confirm position and a fluoroscopic spot image saved. The tube was then flushed with saline. A dressing was applied over the gastrostomy exit site. COMPLICATIONS: None. FINDINGS: Initial fluoroscopy demonstrates adequate opacification of the colon by ingested barium in order to prevent colonic injury during the procedure. The stomach distended well  with air allowing safe placement of the gastrostomy tube. After placement, the tip of the gastrostomy tube lies in the body of the stomach. IMPRESSION: Percutaneous gastrostomy with placement of a 20-French bumper retention tube in the body of the stomach. This tube can be used for percutaneous feeds beginning in 24 hours after placement. Electronically Signed   By: Aletta Edouard M.D.   On: 03/31/2017 11:03    Scheduled Meds: . aspirin  300 mg Rectal Daily   Or  . aspirin  325 mg Oral Daily  . atorvastatin  40 mg Oral q1800  . carvedilol  3.125 mg Per Tube BID WC  . enoxaparin (LOVENOX) injection  30 mg Subcutaneous Q24H  . free water  200 mL Per Tube Q6H  . magnesium oxide  400 mg Per Tube BID  . pantoprazole sodium  40 mg Per Tube QHS  . potassium chloride  40 mEq Oral Once   Continuous Infusions: . feeding supplement (OSMOLITE 1.2 CAL) 1,000 mL (04/01/17 0757)  . lactated ringers 10 mL/hr at 03/31/17 0311   PRN Meds: acetaminophen **OR** acetaminophen (TYLENOL) oral liquid 160 mg/5 mL **OR** acetaminophen, hydrALAZINE, HYDROcodone-acetaminophen, hydroxypropyl methylcellulose / hypromellose, hydrOXYzine, menthol-cetylpyridinium **OR** phenol, methocarbamol, morphine injection, naLOXone (NARCAN)   injection, nitroGLYCERIN, ondansetron **OR** ondansetron (ZOFRAN) IV, senna-docusate, zolpidem  Time spent: 25 minutes   [facility] Pager: [phone]   04/01/2017 9:43 AM  If 7PM-7AM, please contact night-coverage at [email or web address], password [facility]

## 2017-04-01 NOTE — Discharge Instructions (Signed)
° °Dr. Lon Klippel °Joint Replacement Specialist °[facility] °[address] °[place],  [address] °[phone] ° ° °TOTAL HIP REPLACEMENT POSTOPERATIVE DIRECTIONS ° ° ° °Hip Rehabilitation, Guidelines Following Surgery  ° °WEIGHT BEARING °Weight bearing as tolerated with assist device (walker, cane, etc) as directed, use it as long as suggested by your surgeon or therapist, typically at least 4-6 weeks. ° °The results of a hip operation are greatly improved after range of motion and muscle strengthening exercises. Follow all safety measures which are given to protect your hip. If any of these exercises cause increased pain or swelling in your joint, decrease the amount until you are comfortable again. Then slowly increase the exercises. Call your caregiver if you have problems or questions.  ° °HOME CARE INSTRUCTIONS  °Most of the following instructions are designed to prevent the dislocation of your new hip.  °Remove items at home which could result in a fall. This includes throw rugs or furniture in walking pathways.  °Continue medications as instructed at time of discharge. °· You may have some home medications which will be placed on hold until you complete the course of blood thinner medication. °· You may start showering once you are discharged home. Do not remove your dressing. °Do not put on socks or shoes without following the instructions of your caregivers.   °Sit on chairs with arms. Use the chair arms to help push yourself up when arising.  °Arrange for the use of a toilet seat elevator so you are not sitting low.  °· Walk with walker as instructed.  °You may resume a sexual relationship in one month or when given the OK by your caregiver.  °Use walker as long as suggested by your caregivers.  °You may put full weight on your legs and walk as much as is comfortable. °Avoid periods of inactivity such as sitting longer than an hour when not asleep. This helps prevent  blood clots.  °You may return to work once you are cleared by your surgeon.  °Do not drive a car for 6 weeks or until released by your surgeon.  °Do not drive while taking narcotics.  °Wear elastic stockings for two weeks following surgery during the day but you may remove then at night.  °Make sure you keep all of your appointments after your operation with all of your doctors and caregivers. You should call the office at the above phone number and make an appointment for approximately two weeks after the date of your surgery. °Please pick up a stool softener and laxative for home use as long as you are requiring pain medications. °· ICE to the affected hip every three hours for 30 minutes at a time and then as needed for pain and swelling. Continue to use ice on the hip for pain and swelling from surgery. You may notice swelling that will progress down to the foot and ankle.  This is normal after surgery.  Elevate the leg when you are not up walking on it.   °It is important for you to complete the blood thinner medication as prescribed by your doctor. °· Continue to use the breathing machine which will help keep your temperature down.  It is common for your temperature to cycle up and down following surgery, especially at night when you are not up moving around and exerting yourself.  The breathing machine keeps your lungs expanded and your temperature down. ° °RANGE OF MOTION AND STRENGTHENING EXERCISES  °These exercises   are designed to help you keep full movement of your hip joint. Follow your caregiver's or physical therapist's instructions. Perform all exercises about fifteen times, three times per day or as directed. Exercise both hips, even if you have had only one joint replacement. These exercises can be done on a training (exercise) mat, on the floor, on a table or on a bed. Use whatever works the best and is most comfortable for you. Use music or television while you are exercising so that the exercises  are a pleasant break in your day. This will make your life better with the exercises acting as a break in routine you can look forward to.  Lying on your back, slowly slide your foot toward your buttocks, raising your knee up off the floor. Then slowly slide your foot back down until your leg is straight again.  Lying on your back spread your legs as far apart as you can without causing discomfort.  Lying on your side, raise your upper leg and foot straight up from the floor as far as is comfortable. Slowly lower the leg and repeat.  Lying on your back, tighten up the muscle in the front of your thigh (quadriceps muscles). You can do this by keeping your leg straight and trying to raise your heel off the floor. This helps strengthen the largest muscle supporting your knee.  Lying on your back, tighten up the muscles of your buttocks both with the legs straight and with the knee bent at a comfortable angle while keeping your heel on the floor.   SKILLED REHAB INSTRUCTIONS: If the patient is transferred to a skilled rehab facility following release from the hospital, a list of the current medications will be sent to the facility for the patient to continue.  When discharged from the skilled rehab facility, please have the facility set up the patient's [place] prior to being released. Also, the skilled facility will be responsible for providing the patient with their medications at time of release from the facility to include their pain medication and their blood thinner medication. If the patient is still at the rehab facility at time of the two week follow up appointment, the skilled rehab facility will also need to assist the patient in arranging follow up appointment in our office and any transportation needs.  MAKE SURE YOU:  Understand these instructions.  Will watch your condition.  Will get help right away if you are not doing well or get worse.  Pick up stool softner and  laxative for home use following surgery while on pain medications. Daily dressing change with island dressing (non stick telfa dressing) Continue to use ice for pain and swelling after surgery. Do not use any lotions or creams on the incision until instructed by your surgeon. Total Hip Protocol.

## 2017-04-01 NOTE — Progress Notes (Signed)
    Subjective:  Patient reports pain as mild.  Denies N/V/CP/SOB.   Objective:   VITALS:   Vitals:   04/01/17 0002 04/01/17 0443 04/01/17 0838 04/01/17 1003  BP: (!) 138/58 (!) 148/67 (!) 147/74 139/81  Pulse: 93 63 94 92  Resp: 18 18 16 16   Temp: 98 F (36.7 C) 98.1 F (36.7 C) 98.6 F (37 C) 98.2 F (36.8 C)  TempSrc: Oral Oral Oral Oral  SpO2: 98% 97% 97% 99%  Weight:  46.7 kg (102 lb 15.3 oz)    Height:        NAD ABD soft Sensation intact distally Intact pulses distally Dorsiflexion/Plantar flexion intact Incision: dressing C/D/I Compartment soft   Lab Results  Component Value Date   WBC 7.9 04/01/2017   HGB 8.3 (L) 04/01/2017   HCT 25.6 (L) 04/01/2017   MCV 85.6 04/01/2017   PLT 293 04/01/2017   BMET    Component Value Date/Time   NA 142 03/31/2017 0515   K 3.9 03/31/2017 0515   CL 107 03/31/2017 0515   CO2 24 03/31/2017 0515   GLUCOSE 101 (H) 03/31/2017 0515   BUN 26 (H) 03/31/2017 0515   CREATININE 1.26 (H) 04/01/2017 0714   CALCIUM 8.1 (L) 03/31/2017 0515   GFRNONAA 38 (L) 04/01/2017 0714   GFRAA 44 (L) 04/01/2017 0714     Assessment/Plan: 7 Days Post-Op   Principal Problem:   Stroke [facility]) Active Problems:   Fall   Fracture of femoral neck, left, closed ([place])   AKI (acute kidney injury) ([place])   Hypertensive emergency   Elevated troponin   Hypokalemia   GERD (gastroesophageal reflux disease)   Protein-calorie malnutrition, severe   Displaced fracture of left femoral neck ([facility])   WBAT with walker DVT ppx: lovenox, SCDs, TEDS PO pain control PT/OT D/C Prevena Daily dressing change with island dressing (telfa dressing) Dispo: D/C to SNF    Hilton Cork Atha Muradyan 04/01/2017, 2:09 PM   Rod Can, MD Cell [phone]

## 2017-04-01 NOTE — Progress Notes (Signed)
Nutrition Follow-up  DOCUMENTATION CODES:   Severe malnutrition in context of chronic illness  INTERVENTION:   Continue Osmolite 1.2 @ 50 ml/hr via PEG  Continue 200 ml free water flush every 6 hours per MD  Tube feeding regimen provides 1440 kcal (>100% of needs), 67 grams of protein, and 985 ml of H2O. Total free water: 1785 ml   NUTRITION DIAGNOSIS:   Severe Malnutrition related to chronic illness as evidenced by severe muscle depletion, severe fat depletion.  Ongoing  GOAL:   Patient will meet greater than or equal to 90% of their needs  Met with TF  MONITOR:   I & O's, Skin, TF tolerance, Vent status, Diet advancement, Labs  REASON FOR ASSESSMENT:   Consult Enteral/tube feeding initiation and management  ASSESSMENT:   Pt. with no known PMH; admitted on 03/21/2017, presented with complaint of fall, was found to have acute stroke as well as left hip fracture and AKI.  2/20- Cortrak placed, gastric 2/21- Osmolite 1.2 started @ 10 mL/hr with recommendation to advance by 10 mL/hr every 12 hrs to goal rate of 50 mL/hr 2/22- s/p L hip hemiarthroplasty 2/23- started free water flushes through Cortrak of 200 mL QID 2/27 - Failed MBS, PEG placement scheduled 2/28- PEG placed  RD consulted for tube feeding initiation and recommendations. Osmolite 1.2 currently infusing via PEG at 50 ml/hr. Pt also receiving 200 ml free water flush every 6 hours per MD orders. Complete TF regimen providing  1440 kcal (>100% of needs), 67 grams of protein, and 985 ml of H2O. Total free water: 1785 ml.   Medications reviewed and include magnesium oxide and potassium chloride.   Reviewed wt hx; overall noted weight gain since admission, which is favorable, given pt's malnutrition.   CSW assisting with SNF placement once medically stable.   Labs reviewed: CBGS: 144-215.   Diet Order:  Fall precautions  EDUCATION NEEDS:   Not appropriate for education at this time  Skin:  Skin  Assessment: Skin Integrity Issues: Skin Integrity Issues:: Incisions Incisions: closed incision to L hip  Last BM:  03/30/17  Height:   Ht Readings from Last 1 Encounters:  03/25/17 5' (1.524 m)    Weight:   Wt Readings from Last 1 Encounters:  04/01/17 102 lb 15.3 oz (46.7 kg)    Ideal Body Weight:  45.45 kg  BMI:  Body mass index is 20.11 kg/m.  Estimated Nutritional Needs:   Kcal:  4847-2072 calories  Protein:  61-70 grams (1.5-1.7g/kg)  Fluid:  >1.5L    Eliska Hamil A. Jimmye Norman, RD, LDN, CDE Pager: [phone] After hours Pager: [phone]

## 2017-04-01 NOTE — Progress Notes (Signed)
  Speech Language Pathology Treatment: Dysphagia  Patient Details Name: Alyssa Savage MRN: [id number] DOB: 10-17-1930 Today's Date: 04/01/2017 Time: 3299-2426 SLP Time Calculation (min) (ACUTE ONLY): 9 min  Assessment / Plan / Recommendation Clinical Impression  Pt practiced swallowing exercises (CTAR, holding CTAR, and supraglottic swallow) 2 rounds 5X each with min verbal/visual cues to maintain/hold chin-tuck. Pt and daughter completed one round of exercises independently and reported frequent practice throughout the day. Pt and family educated on need to frequently perform oral care (especially) before exercises. MBS results were reinforced to explain the need to practice "hard swallows," and pt's potential swallowing recovery with reconditioning and time. SLP will follow up to ensure compliance with swallowing exercise protocol and to continue to address speech intelligibility goals.   HPI HPI: 84 y.o.femalewho notes that she has had significant slurred speech since 03/21/17. Head CT was performed which shows right basal ganglia infarct; MRI shows Acute infarction in the posteriorLEFTbasal ganglia, posterior limb internal capsule and radiating white matter tracts.  Recent fall with left hip fracture.       SLP Plan  Continue with current plan of care       Recommendations  Diet recommendations: NPO Medication Administration: Via alternative means                Oral Care Recommendations: Oral care QID Follow up Recommendations: Skilled Nursing facility SLP Visit Diagnosis: Dysphagia, oropharyngeal phase (R13.12);Dysarthria and anarthria (R47.1) Plan: Continue with current plan of care       GO              Alyssa Savage SLP Student Clinician   Alyssa Jaxx Huish 04/01/2017, 3:52 PM

## 2017-04-02 DIAGNOSIS — R1310 Dysphagia, unspecified: Secondary | ICD-10-CM

## 2017-04-02 LAB — GLUCOSE, CAPILLARY
GLUCOSE-CAPILLARY: 110 mg/dL — AB (ref 65–99)
GLUCOSE-CAPILLARY: 150 mg/dL — AB (ref 65–99)
Glucose-Capillary: 153 mg/dL — ABNORMAL HIGH (ref 65–99)
Glucose-Capillary: 154 mg/dL — ABNORMAL HIGH (ref 65–99)
Glucose-Capillary: 172 mg/dL — ABNORMAL HIGH (ref 65–99)
Glucose-Capillary: 188 mg/dL — ABNORMAL HIGH (ref 65–99)

## 2017-04-02 LAB — BASIC METABOLIC PANEL
ANION GAP: 11 (ref 5–15)
BUN: 26 mg/dL — ABNORMAL HIGH (ref 6–20)
CHLORIDE: 104 mmol/L (ref 101–111)
CO2: 23 mmol/L (ref 22–32)
Calcium: 8 mg/dL — ABNORMAL LOW (ref 8.9–10.3)
Creatinine, Ser: 1.09 mg/dL — ABNORMAL HIGH (ref 0.44–1.00)
GFR calc Af Amer: 52 mL/min — ABNORMAL LOW (ref >60)
GFR, EST NON AFRICAN AMERICAN: 45 mL/min — AB (ref >60)
GLUCOSE: 173 mg/dL — AB (ref 65–99)
POTASSIUM: 4 mmol/L (ref 3.5–5.1)
Sodium: 138 mmol/L (ref 135–145)

## 2017-04-02 LAB — CBC
HCT: 26.3 % — ABNORMAL LOW (ref 36.0–46.0)
HEMOGLOBIN: 8.4 g/dL — AB (ref 12.0–15.0)
MCH: 27.7 pg (ref 26.0–34.0)
MCHC: 31.9 g/dL (ref 30.0–36.0)
MCV: 86.8 fL (ref 78.0–100.0)
Platelets: 311 10*3/uL (ref 150–400)
RBC: 3.03 MIL/uL — AB (ref 3.87–5.11)
RDW: 15.1 % (ref 11.5–15.5)
WBC: 6.3 10*3/uL (ref 4.0–10.5)

## 2017-04-02 MED ORDER — ATORVASTATIN CALCIUM 40 MG PO TABS: 40.0000 mg | ORAL_TABLET | Freq: Every day | ORAL | 0 refills | Status: DC

## 2017-04-02 MED ORDER — ASPIRIN 325 MG PO TABS: 325.0000 mg | ORAL_TABLET | Freq: Every day | ORAL | 0 refills | Status: DC

## 2017-04-02 MED ORDER — FREE WATER: 200.0000 mL | Freq: Four times a day (QID) | 5 refills | Status: DC

## 2017-04-02 MED ORDER — OSMOLITE 1.2 CAL PO LIQD: 1000.0000 mL | ORAL | 5 refills | Status: DC

## 2017-04-02 MED ORDER — NITROGLYCERIN 0.4 MG SL SUBL: 0.4000 mg | SUBLINGUAL_TABLET | SUBLINGUAL | 0 refills | Status: AC | PRN

## 2017-04-02 MED ORDER — CARVEDILOL 3.125 MG PO TABS: 3.1250 mg | ORAL_TABLET | Freq: Two times a day (BID) | ORAL | 0 refills | Status: DC

## 2017-04-02 MED ORDER — METHOCARBAMOL 500 MG PO TABS: 500.0000 mg | ORAL_TABLET | Freq: Three times a day (TID) | ORAL | 0 refills | Status: DC | PRN

## 2017-04-02 MED ORDER — SENNOSIDES-DOCUSATE SODIUM 8.6-50 MG PO TABS: 1.0000 | ORAL_TABLET | Freq: Every evening | ORAL | 0 refills | Status: DC | PRN

## 2017-04-02 MED ORDER — HYPROMELLOSE (GONIOSCOPIC) 2.5 % OP SOLN: 1.0000 [drp] | Freq: Three times a day (TID) | OPHTHALMIC | 0 refills | Status: DC | PRN

## 2017-04-02 NOTE — Clinical Social Work Note (Signed)
CSW spoke with [facility] and dietary is not there on the weekend to establish tube feeds. Facility states they were unaware pt had a feeding tube.  CSW will notify MD.   Loletha Grayer, [place]

## 2017-04-02 NOTE — Progress Notes (Signed)
Late entry for 0715: While giving bedside report, patient is alert and oriented, with complaint of pain at site of g tube insertion. Oncoming nurse aware.  Daughter, from [place] at bedside. Patient safety maintained.

## 2017-04-02 NOTE — Discharge Summary (Signed)
Physician Discharge Summary  Yardley Testerman MVH:[id number] DOB: [birth year] DOA: 03/21/2017  PCP: Patient, No Pcp Per  Admit date: 03/21/2017 Discharge date: 04/02/2017  Admitted From: Home Disposition: Nursing home  Recommendations for Outpatient Follow-up:  1. Follow up with nursing home provider at earliest convenience 2. Follow-up with neurology as an outpatient 3. Follow-up with orthopedic/Dr. Lyla Glassing as an outpatient 4. Fall precautions   Home Health: No Equipment/Devices: Peg tube  Discharge Condition: Guarded  CODE STATUS: Full Diet recommendation: Heart Healthy tube feeding diet as per dietary recommendations  Brief/Interim Summary: 84 year old female who was admitted on 03/21/2017 after a fall, was found to have acute stroke as well as left hip fracture.  Neurology was consulted and stroke workup has been completed.  Orthopedic surgery was consulted and patient underwent left hip hemiarthroplasty on 03/25/2017.  Due to failed modified barium swallow, patient underwent a PEG tube placement on 03/31/2017.  PEG tube feeding was started on 04/01/2017.  She is tolerating PEG tube feeding.  She will be discharged to a nursing home once bed is available.  Discharge Diagnoses:  Principal Problem:   Stroke [facility]) Active Problems:   Fall   Fracture of femoral neck, left, closed ([place])   AKI (acute kidney injury) ([place])   Hypertensive emergency   Elevated troponin   Hypokalemia   GERD (gastroesophageal reflux disease)   Protein-calorie malnutrition, severe   Displaced fracture of left femoral neck (HCC)  Acute small vessel left basal ganglia stroke:  -Initially underwent a head CT which showed right basal ganglia stroke, followed by an MRI which revealed 1.5-2 cm Acute infarction in the posterior LEFT basal ganglia, posterior limb internal capsule and radiating white matter tracts.  -Aspirinand lipitor -LDL 117, hemoglobin A1c 5.9. -2D transthoracic echocardiography 60-65% EF, no  acute abnormality. Carotid Doppler2/21, 1-39% stenosis bilaterally -PT/OT -recommends SNF -SLP -failed modified barium swallow, PEG 03/31/17 -Neurology recommends 325 mg of aspirin -Consider 30 day cardiac event monitor at discharge, will arrange to rule out atrial fibrillation recommended by Dr. Leonie Man, notified cardiology.  This will be arranged by cardiology -telemetry shows normal sinus rhythm -Follow up with [facility] in 6 weeks   Falland fracture of femoral neck, left, closed [facility]): -Orthopedic surgeon, Dr.Swinteck performed left hip hemiarthroplasty on 03/25/17 -She is status post left hip hemiarthroplasty -Outpatient follow-up with Dr. Lyla Glassing.  Wound care as per orthopedics recommendations.  Patient will be discharged on Lovenox for DVT prophylaxis as per orthopedics.   Likely acute kidney injury on chronic kidney disease stage III versus acute kidney injury: -Resolved.  Creatinine stable.  Outpatient follow-up  Hypertensive emergency -BP improved.  Continue Coreg.  Titrate as an outpatient  Elevated troponin: trop 0.10. No CP.Likely due to demand ischemia secondary to hypertensionandacute stroke. Patient received preop clearance on 03/23/17 by cardiology -prn Nitroglycerin, Morphine, and aspirin, lipitor  -2d echo shows no acute abnormality, preserved EF. -Troponins were mildly elevated during this admission without EKG changes   Hypokalemia: Replaced.  Hypomagnesemia: -Replaced  Dysphagia -Patient sustained dysphagia post stroke. -Unable to pass MBS. -Continue PEG tube feeding as per dietary recommendations  Discharge Instructions  Discharge Instructions    Ambulatory referral to Neurology   Complete by:  As directed    An appointment is requested in approximately: 6 weeks Follow up with stroke clinic (Dr Leonie Man preferred, if not available, then consider [facility] Chestnut, [facility] or Jaynee Eagles whoever is available) at [facility] in about 6-8 weeks.  Thanks.   Call MD for:  difficulty breathing,  headache or visual disturbances   Complete by:  As directed    Call MD for:  extreme fatigue   Complete by:  As directed    Call MD for:  hives   Complete by:  As directed    Call MD for:  persistant dizziness or light-headedness   Complete by:  As directed    Call MD for:  persistant nausea and vomiting   Complete by:  As directed    Call MD for:  severe uncontrolled pain   Complete by:  As directed    Call MD for:  temperature >100.4   Complete by:  As directed    Diet - low sodium heart healthy   Complete by:  As directed    Tube feeding diet as per Dietary recommendations   Increase activity slowly   Complete by:  As directed    Fall precautions     Allergies as of 04/02/2017   No Known Allergies     Medication List    STOP taking these medications   OVER THE COUNTER MEDICATION     TAKE these medications   aspirin 325 MG tablet Place 1 tablet (325 mg total) into feeding tube daily. Start taking on:  04/03/2017   atorvastatin 40 MG tablet Commonly known as:  LIPITOR Place 1 tablet (40 mg total) into feeding tube daily at 6 PM.   carvedilol 3.125 MG tablet Commonly known as:  COREG Place 1 tablet (3.125 mg total) into feeding tube 2 (two) times daily with a meal.   enoxaparin 30 MG/0.3ML injection Commonly known as:  LOVENOX Inject 0.3 mLs (30 mg total) into the skin daily.   feeding supplement (OSMOLITE 1.2 CAL) Liqd Place 1,000 mLs into feeding tube continuous.   free water Soln Place 200 mLs into feeding tube every 6 (six) hours.   HYDROcodone-acetaminophen 5-325 MG tablet Commonly known as:  NORCO/VICODIN Take 1-2 tablets by mouth every 6 (six) hours as needed.   hydroxypropyl methylcellulose / hypromellose 2.5 % ophthalmic solution Commonly known as:  ISOPTO TEARS / GONIOVISC Place 1 drop into both eyes 3 (three) times daily as needed for dry eyes.   methocarbamol 500 MG tablet Commonly known as:   ROBAXIN Place 1 tablet (500 mg total) into feeding tube every 8 (eight) hours as needed for muscle spasms.   nitroGLYCERIN 0.4 MG SL tablet Commonly known as:  NITROSTAT Place 1 tablet (0.4 mg total) under the tongue every 5 (five) minutes as needed for chest pain.   senna-docusate 8.6-50 MG tablet Commonly known as:  Senokot-S Place 1 tablet into feeding tube at bedtime as needed for mild constipation.      Follow-up Information    Garvin Fila, MD. Schedule an appointment as soon as possible for a visit in 6 week(s).   Specialties:  Neurology, Radiology Contact information: [address] [place] [address] [phone]        Rod Can, MD. Schedule an appointment as soon as possible for a visit on 04/12/2017.   Specialty:  Orthopedic Surgery Why:  suture removal Contact information: [address] [place] [address] [phone]        Follow up Follow up on 04/01/2017.        [place] Follow up on 04/13/2017.   Why:  your appointment time is 10:00 am. Please arrive 15 min early and bring: insurance, picture ID and current medications. Contact information: [place]  [place] [id number] [phone]         No Known Allergies  Consultations:  Neurology/orthopedics/cardiology   Procedures/Studies: Ct Abdomen Wo Contrast  Result Date: 03/29/2017 CLINICAL DATA:  Dysphagia, preop evaluation for gastrostomy tube EXAM: CT ABDOMEN WITHOUT CONTRAST TECHNIQUE: Multidetector CT imaging of the abdomen was performed following the standard protocol without IV contrast. COMPARISON:  None. FINDINGS: Lower chest: Scattered coronary calcifications. Visualized lung bases clear. No pleural or pericardial effusion. Hepatobiliary: No focal liver abnormality is seen. No gallstones, gallbladder wall thickening, or biliary dilatation. Pancreas: Unremarkable. No pancreatic  ductal dilatation or surrounding inflammatory changes. Spleen: Normal in size without focal abnormality. Adrenals/Urinary Tract: Probable bilateral renal cysts, incompletely characterized. No hydronephrosis. Stomach/Bowel: Feeding tube extends just across the pylorus. The stomach is nondilated. There is a safe percutaneous approach for gastrostomy placement. Mild gaseous distention of visualized loops of small bowel and colon. Vascular/Lymphatic: Minimal scattered aortic atheromatous calcifications without aneurysm. No abdominal adenopathy localized. Other: Lobular right pelvic mass with coarse peripherally calcified components probably myomatous uterus but incompletely characterized. No ascites. No free air. Musculoskeletal: No acute or significant osseous findings. IMPRESSION: 1. A safe percutaneous approach for gastrostomy placement is identified. 2. Coronary and Aortic Atherosclerosis (ICD10-170.0). 3. Probable myomatous uterus, incompletely characterized. Electronically Signed   By: Lucrezia Europe M.D.   On: 03/29/2017 13:40   Dg Chest 1 View  Result Date: 03/21/2017 CLINICAL DATA:  Fall EXAM: CHEST 1 VIEW COMPARISON:  None. FINDINGS: No acute pulmonary infiltrate or effusion. Mild cardiomegaly. Tortuous ectatic aorta with mild atherosclerosis. No pneumothorax. IMPRESSION: 1. Negative for acute infiltrate or edema 2. Cardiomegaly Electronically Signed   By: Donavan Foil M.D.   On: 03/21/2017 19:50   Dg Pelvis 1-2 Views  Result Date: 03/21/2017 CLINICAL DATA:  Fall with pain EXAM: PELVIS - 1-2 VIEW COMPARISON:  None. FINDINGS: Acute, displaced left femoral neck fracture with mild superior migration of the distal femur. Left femoral head projects in joint. Pubic symphysis and rami are intact. Normal right hip alignment. Multiple large calcified masses within the pelvis, measuring up to 6.4 cm. IMPRESSION: 1. Acute displaced left femoral neck fracture 2. Multiple large calcified masses in the pelvis, possibly  due to large calcified fibroids, CT could better localize the calcifications. Electronically Signed   By: Donavan Foil M.D.   On: 03/21/2017 19:47   Ct Head Wo Contrast  Result Date: 03/21/2017 CLINICAL DATA:  [place] 2 weeks ago, altered mental status. EXAM: CT HEAD WITHOUT CONTRAST TECHNIQUE: Contiguous axial images were obtained from the base of the skull through the vertex without intravenous contrast. COMPARISON:  None. FINDINGS: BRAIN: Patchy hypodense RIGHT basal ganglia. Old LEFT basal ganglia and bilateral thalamus lacunar infarcts. Small area suspected RIGHT frontal lobe encephalomalacia. Patchy to confluent supratentorial white matter hypodensities. No advanced parenchymal brain volume loss for age. No hydrocephalus. No hemorrhage. No abnormal extra-axial fluid collections. VASCULAR: Mild calcific atherosclerosis of the carotid siphons. SKULL: No skull fracture. Anteriorly subluxed mandible condyles presumably from open mouth positioning. No significant scalp soft tissue swelling. SINUSES/ORBITS: The mastoid air-cells and included paranasal sinuses are well-aerated.The included ocular globes and orbital contents are non-suspicious. OTHER: 14 mm single bubble of gas LEFT infratemporal fossa. IMPRESSION: 1. Acute nonhemorrhagic RIGHT basal ganglia infarct. 2. Old bilateral basal ganglia and thalamus lacunar infarcts. Moderate to severe chronic small vessel ischemic disease. 3. Acute findings discussed with and reconfirmed by Dr.JON KNAPP on 03/21/2017 at 8:44 pm. Electronically Signed   By: Elon Alas  M.D.   On: 03/21/2017 20:45   Mr Brain Wo Contrast  Result Date: 03/22/2017 CLINICAL DATA:  [place] 2 weeks ago. Gait disturbance. Slurred speech. Left facial droop. EXAM: MRI HEAD WITHOUT CONTRAST MRA HEAD WITHOUT CONTRAST TECHNIQUE: Multiplanar, multiecho pulse sequences of the brain and surrounding structures were obtained without intravenous contrast. Angiographic images of the head were obtained  using MRA technique without contrast. COMPARISON:  CT 03/21/2017 FINDINGS: MRI HEAD FINDINGS Brain: 1.5 x 2 cm region of acute infarction in the posterior basal ganglia/posterior limb internal capsule/radiating white matter tracts on the left. No mass effect or hemorrhage. No other acute infarction. Elsewhere, there are extensive chronic small vessel ischemic changes throughout the pons. No focal cerebellar insult. Chronic small-vessel ischemic changes are present throughout the thalami, basal ganglia and hemispheric white matter. Scattered foci of hemosiderin deposition are present scattered throughout the brain related to the old small vessel insults. No evidence of large vessel territory infarction or mass lesion. No sign of acute hemorrhage. No hydrocephalus or extra-axial collection. Vascular: Major vessels at the base of the brain show flow. Skull and upper cervical spine: Negative Sinuses/Orbits: Clear/normal Other: None MRA HEAD FINDINGS Both internal carotid arteries are patent through the skull base and siphon regions. The anterior and middle cerebral vessels are patent without proximal stenosis. More distal branch vessels show atherosclerotic irregularity and narrowing, particularly evident in the anterior cerebral arteries. Both vertebral arteries are patent to the basilar. No basilar stenosis. Posterior circulation branch vessels are patent. Pronounced atherosclerotic irregularity of the PCA branches. IMPRESSION: 1.5-2 cm acute infarction in the posterior left basal ganglia, posterior limb internal capsule and radiating white matter tracts. No mass effect or hemorrhage. Widespread chronic small-vessel ischemic changes elsewhere throughout the brain as outlined above. No large vessel occlusion or correctable proximal stenosis. Widespread intracranial medium to small vessel atherosclerotic narrowing and irregularity. Electronically Signed   By: Nelson Chimes M.D.   On: 03/22/2017 11:49   Ir Gastrostomy  Tube Mod Sed  Result Date: 03/31/2017 CLINICAL DATA:  Stroke and dysphagia. Need for percutaneous gastrostomy tube for nutrition. EXAM: PERCUTANEOUS GASTROSTOMY TUBE PLACEMENT ANESTHESIA/SEDATION: 1.5 mg IV Versed; 50 mcg IV Fentanyl. Total Moderate Sedation Time 10 minutes. The patient's level of consciousness and physiologic status were continuously monitored during the procedure by Radiology nursing. CONTRAST:  10 mL Isovue-300 MEDICATIONS: 2 g IV Ancef. IV antibiotic was administered in an appropriate time interval prior to needle puncture of the skin. FLUOROSCOPY TIME:  1 minute and 42 seconds.  3.2 mGy. PROCEDURE: The procedure, risks, benefits, and alternatives were explained to the patient's daughter. Questions regarding the procedure were encouraged and answered. The patient's daughter understands and consents to the procedure. The evening prior to the procedure, the patient was given thin liquid barium to ingest in order to opacify the colon. A 5-French catheter was then advanced through the patient's mouth under fluoroscopy into the esophagus and to the level of the stomach. This catheter was used to insufflate the stomach with air under fluoroscopy. The abdominal wall was prepped with Betadine in a sterile fashion, and a sterile drape was applied covering the operative field. A sterile gown and sterile gloves were used for the procedure. Local anesthesia was provided with 1% Lidocaine. A skin incision was made in the upper abdominal wall. Under fluoroscopy, an 18 gauge trocar needle was advanced into the stomach. Contrast injection was performed to confirm intraluminal position of the needle tip. A single T tack was then deployed in the  lumen of the stomach. This was brought up to tension at the skin surface. Over a guidewire, a 9-French sheath was advanced into the lumen of the stomach. The wire was left in place as a safety wire. A loop snare device from a percutaneous gastrostomy kit was then  advanced into the stomach. A floppy guide wire was advanced through the orogastric catheter under fluoroscopy in the stomach. The loop snare advanced through the percutaneous gastric access was used to snare the guide wire. This allowed withdrawal of the loop snare out of the patient's mouth by retraction of the orogastric catheter and wire. A 20-French bumper retention gastrostomy tube was looped around the snare device. It was then pulled back through the patient's mouth. The retention bumper was brought up to the anterior gastric wall. The T tack suture was cut at the skin. The exiting gastrostomy tube was cut to appropriate length and a feeding adapter applied. The catheter was injected with contrast material to confirm position and a fluoroscopic spot image saved. The tube was then flushed with saline. A dressing was applied over the gastrostomy exit site. COMPLICATIONS: None. FINDINGS: Initial fluoroscopy demonstrates adequate opacification of the colon by ingested barium in order to prevent colonic injury during the procedure. The stomach distended well with air allowing safe placement of the gastrostomy tube. After placement, the tip of the gastrostomy tube lies in the body of the stomach. IMPRESSION: Percutaneous gastrostomy with placement of a 20-French bumper retention tube in the body of the stomach. This tube can be used for percutaneous feeds beginning in 24 hours after placement. Electronically Signed   By: Aletta Edouard M.D.   On: 03/31/2017 11:03   Pelvis Portable  Result Date: 03/25/2017 CLINICAL DATA:  Postop EXAM: PORTABLE PELVIS 1-2 VIEWS COMPARISON:  None. FINDINGS: Left hip arthroplasty, in satisfactory position. Mild degenerative changes of the right hip. Visualized bony pelvis appears intact. Suspected calcified uterine fibroids. Additional residual contrast in the distal small bowel and right colon. IMPRESSION: Left hip arthroplasty, in satisfactory position. Electronically Signed    By: Julian Hy M.D.   On: 03/25/2017 13:57   Dg Abd Portable 1v  Result Date: 03/23/2017 CLINICAL DATA:  Evaluate feeding tube placement EXAM: PORTABLE ABDOMEN - 1 VIEW COMPARISON:  None FINDINGS: The enteric tube tip is in the projection of the expected location of the distal stomach. Diffuse gaseous distension of the large bowel loops identified. Enteric contrast material from swallow study performed earlier today is identified within nondilated small bowel loops. Large calcified fibroids noted within the pelvis. IMPRESSION: 1. Enteric tube tip projects over the expected location of the distal stomach. 2. Diffuse gaseous distension of the large bowel loops which may represent colonic ileus or distal large bowel obstruction. Electronically Signed   By: Kerby Moors M.D.   On: 03/23/2017 17:26   Dg Swallowing Func-speech Pathology  Result Date: 03/29/2017 Objective Swallowing Evaluation: Type of Study: MBS-Modified Barium Swallow Study  Patient Details Name: Darien Mignogna MRN: [id number] Date of Birth: [birth year] Today's Date: 03/29/2017 Time: SLP Start Time (ACUTE ONLY): 1125 -SLP Stop Time (ACUTE ONLY): 1145 SLP Time Calculation (min) (ACUTE ONLY): 20 min Past Medical History: Past Medical History: Diagnosis Date . Femoral neck fracture ([place]) 03/2017  left  . GERD (gastroesophageal reflux disease)  . Hypertensive emergency 03/2017 . Hypokalemia 03/2017 . Stroke [facility])  Past Surgical History: Past Surgical History: Procedure Laterality Date . ANTERIOR APPROACH HEMI HIP ARTHROPLASTY Left 03/25/2017  Procedure: ANTERIOR APPROACH HEMI HIP ARTHROPLASTY;  Surgeon: Rod Can, MD;  Location: [place];  Service: Orthopedics;  Laterality: Left; . NO PAST SURGERIES   HPI: 84 y.o.femalewho notes that she has had significant slurred speech since 03/21/17. Head CT was performed which shows right basal ganglia infarct; MRI shows Acute infarction in the posteriorLEFTbasal ganglia, posterior limb internal  capsule and radiating white matter tracts.  Recent fall with left hip fracture.  Subjective: alert, cooperative, very talkative but unintelligible Assessment / Plan / Recommendation CHL IP CLINICAL IMPRESSIONS 03/29/2017 Clinical Impression Pt's oropharyngeal abilites don't appear changed over previous MBS on 03/23/17. During this study, pt with severe oropharyngeal dysphagia c/b delayed swallow initiation as bolus (honey and nectar by spoon) falls from vallucula to pyriform sinuses resulting in penetration before and during swallow. Pt also demonstrate significant pharyngeal weakness resulting in widespread moderate to severe pharyngeal residue leading to trace aspiration of all consistencies. With Max A cues pt able to produce 6 to 7 consecutive swallows to decrease residue to mild residue. In addition to decreased safety with consumpiton, pt demonstrates decreased effeciency with PO consumption. she frequently struggled to breath, gasped, appeared strangled and required Max A cues to relax and swallow, breath. SLP recommends continued NPO status with consideration for long term alternative means of nutrition. SLP will follow for therapeutic exercise to include effortful swallows and CTAR to continue use of swallow musculature.  SLP Visit Diagnosis Dysphagia, oropharyngeal phase (R13.12) Attention and concentration deficit following -- Frontal lobe and executive function deficit following -- Impact on safety and function Severe aspiration risk;Risk for inadequate nutrition/hydration   CHL IP TREATMENT RECOMMENDATION 03/29/2017 Treatment Recommendations Therapy as outlined in treatment plan below   Prognosis 03/29/2017 Prognosis for Safe Diet Advancement Fair Barriers to Reach Goals Severity of deficits Barriers/Prognosis Comment -- CHL IP DIET RECOMMENDATION 03/29/2017 SLP Diet Recommendations NPO;Alternative means - long-term Liquid Administration via -- Medication Administration Via alternative means Compensations --  Postural Changes Remain semi-upright after after feeds/meals (Comment)   CHL IP OTHER RECOMMENDATIONS 03/29/2017 Recommended Consults -- Oral Care Recommendations Oral care QID Other Recommendations Have oral suction available   CHL IP FOLLOW UP RECOMMENDATIONS 03/29/2017 Follow up Recommendations Skilled Nursing facility   [facility] IP FREQUENCY AND DURATION 03/29/2017 Speech Therapy Frequency (ACUTE ONLY) min 2x/week Treatment Duration 2 weeks      CHL IP ORAL PHASE 03/29/2017 Oral Phase Impaired Oral - Pudding Teaspoon -- Oral - Pudding Cup -- Oral - Honey Teaspoon Right anterior bolus loss Oral - Honey Cup -- Oral - Nectar Teaspoon Right anterior bolus loss Oral - Nectar Cup -- Oral - Nectar Straw -- Oral - Thin Teaspoon NT Oral - Thin Cup -- Oral - Thin Straw -- Oral - Puree NT Oral - Mech Soft -- Oral - Regular -- Oral - Multi-Consistency -- Oral - Pill -- Oral Phase - Comment --  CHL IP PHARYNGEAL PHASE 03/29/2017 Pharyngeal Phase Impaired Pharyngeal- Pudding Teaspoon -- Pharyngeal -- Pharyngeal- Pudding Cup -- Pharyngeal -- Pharyngeal- Honey Teaspoon Delayed swallow initiation-vallecula;Delayed swallow initiation-pyriform sinuses;Reduced pharyngeal peristalsis Pharyngeal -- Pharyngeal- Honey Cup -- Pharyngeal -- Pharyngeal- Nectar Teaspoon -- Pharyngeal -- Pharyngeal- Nectar Cup -- Pharyngeal -- Pharyngeal- Nectar Straw -- Pharyngeal -- Pharyngeal- Thin Teaspoon -- Pharyngeal -- Pharyngeal- Thin Cup -- Pharyngeal -- Pharyngeal- Thin Straw -- Pharyngeal -- Pharyngeal- Puree -- Pharyngeal -- Pharyngeal- Mechanical Soft -- Pharyngeal -- Pharyngeal- Regular -- Pharyngeal -- Pharyngeal- Multi-consistency -- Pharyngeal -- Pharyngeal- Pill -- Pharyngeal -- Pharyngeal Comment --  No flowsheet data found. No flowsheet data found.  Happi Overton 03/29/2017, 1:45 PM              Dg Swallowing Func-speech Pathology  Result Date: 03/23/2017 Objective Swallowing Evaluation: Type of Study: MBS-Modified Barium Swallow Study  Patient  Details Name: Marcelia Petersen MRN: [id number] Date of Birth: 1930/08/02 Today's Date: 03/23/2017 Time: SLP Start Time (ACUTE ONLY): 1350 -SLP Stop Time (ACUTE ONLY): 1425 SLP Time Calculation (min) (ACUTE ONLY): 35 min Past Medical History: Past Medical History: Diagnosis Date . Femoral neck fracture ([place]) 03/2017  left  . GERD (gastroesophageal reflux disease)  . Hypertensive emergency 03/2017 . Hypokalemia 03/2017 . Stroke [facility])  Past Surgical History: Past Surgical History: Procedure Laterality Date . NO PAST SURGERIES   HPI: 84 y.o.femalewho notes that she has had significant slurred speech since 03/21/17. Head CT was performed which shows right basal ganglia infarct; MRI shows Acute infarction in the posteriorLEFTbasal ganglia, posterior limb internal capsule and radiating white matter tracts.  Recent fall with left hip fracture.  Subjective: alert, cooperative Assessment / Plan / Recommendation CHL IP CLINICAL IMPRESSIONS 03/23/2017 Clinical Impression Pt presents with a moderate-severe oropharyngeal dysphagia marked by significant impairments in oral control/bolus containment and propulsion, poor labial seal; ineffective laryngeal vestibular closure, leading to eventual penetration and trace aspiration of most consistencies, either before the swallow or after the swallow when residue spills into airway; poor pharyngeal contraction with residuals throughout pharynx.  For now, recommend NPO with temporary enteral feeding (Dr. Posey Pronto has ordered cortrak); allow ice chips after oral care.  SLP will follow for therapeutic exercise and PO trials.  Pt agrees with plan.  SLP Visit Diagnosis Dysphagia, oropharyngeal phase (R13.12) Attention and concentration deficit following -- Frontal lobe and executive function deficit following -- Impact on safety and function Severe aspiration risk   CHL IP TREATMENT RECOMMENDATION 03/23/2017 Treatment Recommendations Therapy as outlined in treatment plan below   No flowsheet data  found. CHL IP DIET RECOMMENDATION 03/23/2017 SLP Diet Recommendations NPO;Alternative means - temporary Liquid Administration via -- Medication Administration Via alternative means Compensations -- Postural Changes --   CHL IP OTHER RECOMMENDATIONS 03/23/2017 Recommended Consults -- Oral Care Recommendations Oral care QID Other Recommendations --   CHL IP FOLLOW UP RECOMMENDATIONS 03/23/2017 Follow up Recommendations Inpatient Rehab   CHL IP FREQUENCY AND DURATION 03/23/2017 Speech Therapy Frequency (ACUTE ONLY) min 3x week Treatment Duration 2 weeks      CHL IP ORAL PHASE 03/23/2017 Oral Phase Impaired Oral - Pudding Teaspoon -- Oral - Pudding Cup -- Oral - Honey Teaspoon Right anterior bolus loss;Weak lingual manipulation;Lingual pumping;Reduced posterior propulsion;Right pocketing in lateral sulci;Lingual/palatal residue;Piecemeal swallowing;Delayed oral transit;Decreased bolus cohesion;Premature spillage Oral - Honey Cup -- Oral - Nectar Teaspoon Right anterior bolus loss;Weak lingual manipulation;Lingual pumping;Reduced posterior propulsion;Right pocketing in lateral sulci;Lingual/palatal residue;Piecemeal swallowing;Delayed oral transit;Decreased bolus cohesion;Premature spillage Oral - Nectar Cup -- Oral - Nectar Straw -- Oral - Thin Teaspoon Right anterior bolus loss;Weak lingual manipulation;Lingual pumping;Reduced posterior propulsion;Right pocketing in lateral sulci;Lingual/palatal residue;Piecemeal swallowing;Delayed oral transit;Decreased bolus cohesion;Premature spillage Oral - Thin Cup -- Oral - Thin Straw -- Oral - Puree Right anterior bolus loss;Weak lingual manipulation;Lingual pumping;Reduced posterior propulsion;Right pocketing in lateral sulci;Lingual/palatal residue;Piecemeal swallowing;Delayed oral transit;Decreased bolus cohesion;Premature spillage Oral - Mech Soft -- Oral - Regular -- Oral - Multi-Consistency -- Oral - Pill -- Oral Phase - Comment --  CHL IP PHARYNGEAL PHASE 03/23/2017 Pharyngeal  Phase Impaired Pharyngeal- Pudding Teaspoon -- Pharyngeal -- Pharyngeal- Pudding Cup -- Pharyngeal -- Pharyngeal- Honey Teaspoon Delayed swallow initiation-pyriform sinuses;Reduced pharyngeal  peristalsis;Reduced epiglottic inversion;Reduced anterior laryngeal mobility;Reduced laryngeal elevation;Reduced airway/laryngeal closure;Reduced tongue base retraction;Penetration/Apiration after swallow;Trace aspiration;Pharyngeal residue - valleculae;Pharyngeal residue - pyriform Pharyngeal Material enters airway, passes BELOW cords and not ejected out despite cough attempt by patient Pharyngeal- Honey Cup -- Pharyngeal -- Pharyngeal- Nectar Teaspoon Delayed swallow initiation-pyriform sinuses;Reduced pharyngeal peristalsis;Reduced epiglottic inversion;Reduced anterior laryngeal mobility;Reduced laryngeal elevation;Reduced airway/laryngeal closure;Reduced tongue base retraction;Penetration/Apiration after swallow;Trace aspiration;Pharyngeal residue - valleculae;Pharyngeal residue - pyriform Pharyngeal Material enters airway, passes BELOW cords and not ejected out despite cough attempt by patient Pharyngeal- Nectar Cup -- Pharyngeal -- Pharyngeal- Nectar Straw -- Pharyngeal -- Pharyngeal- Thin Teaspoon Delayed swallow initiation-pyriform sinuses;Reduced pharyngeal peristalsis;Reduced epiglottic inversion;Reduced anterior laryngeal mobility;Reduced laryngeal elevation;Reduced airway/laryngeal closure;Reduced tongue base retraction;Penetration/Apiration after swallow;Trace aspiration;Pharyngeal residue - valleculae;Pharyngeal residue - pyriform Pharyngeal Material enters airway, passes BELOW cords and not ejected out despite cough attempt by patient Pharyngeal- Thin Cup -- Pharyngeal -- Pharyngeal- Thin Straw -- Pharyngeal -- Pharyngeal- Puree Delayed swallow initiation-pyriform sinuses;Reduced pharyngeal peristalsis;Reduced epiglottic inversion;Reduced anterior laryngeal mobility;Reduced laryngeal elevation;Reduced tongue base  retraction;Pharyngeal residue - valleculae;Pharyngeal residue - pyriform Pharyngeal Material enters airway, remains ABOVE vocal cords and not ejected out Pharyngeal- Mechanical Soft -- Pharyngeal -- Pharyngeal- Regular -- Pharyngeal -- Pharyngeal- Multi-consistency -- Pharyngeal -- Pharyngeal- Pill -- Pharyngeal -- Pharyngeal Comment --  No flowsheet data found. No flowsheet data found. Juan Quam Laurice 03/23/2017, 2:41 PM              Dg C-arm 1-60 Min  Result Date: 03/25/2017 CLINICAL DATA:  84 year old female with left femoral neck fracture undergoing ORIF. EXAM: OPERATIVE left HIP (WITH PELVIS IF PERFORMED) 2 VIEWS TECHNIQUE: Fluoroscopic spot image(s) were submitted for interpretation post-operatively. COMPARISON:  Preoperative radiographs 03/21/2017. FINDINGS: 2 intraoperative fluoroscopic AP spot views of the left hip demonstrate a proximal left femur hemiarthroplasty. The femoral head component appears normally aligned in the AP projection with the acetabulum. No unexpected osseous changes. Bulky calcified pelvic mass is redemonstrated, probably severe fibroid uterus. FLUOROSCOPY TIME:  0 min 5 sec IMPRESSION: Left hip hemiarthroplasty with no adverse features. Electronically Signed   By: Genevie Ann M.D.   On: 03/25/2017 13:07   Mr Jodene Nam Head Wo Contrast  Result Date: 03/22/2017 CLINICAL DATA:  [place] 2 weeks ago. Gait disturbance. Slurred speech. Left facial droop. EXAM: MRI HEAD WITHOUT CONTRAST MRA HEAD WITHOUT CONTRAST TECHNIQUE: Multiplanar, multiecho pulse sequences of the brain and surrounding structures were obtained without intravenous contrast. Angiographic images of the head were obtained using MRA technique without contrast. COMPARISON:  CT 03/21/2017 FINDINGS: MRI HEAD FINDINGS Brain: 1.5 x 2 cm region of acute infarction in the posterior basal ganglia/posterior limb internal capsule/radiating white matter tracts on the left. No mass effect or hemorrhage. No other acute infarction.  Elsewhere, there are extensive chronic small vessel ischemic changes throughout the pons. No focal cerebellar insult. Chronic small-vessel ischemic changes are present throughout the thalami, basal ganglia and hemispheric white matter. Scattered foci of hemosiderin deposition are present scattered throughout the brain related to the old small vessel insults. No evidence of large vessel territory infarction or mass lesion. No sign of acute hemorrhage. No hydrocephalus or extra-axial collection. Vascular: Major vessels at the base of the brain show flow. Skull and upper cervical spine: Negative Sinuses/Orbits: Clear/normal Other: None MRA HEAD FINDINGS Both internal carotid arteries are patent through the skull base and siphon regions. The anterior and middle cerebral vessels are patent without proximal stenosis. More distal branch vessels show atherosclerotic irregularity and narrowing, particularly evident in the  anterior cerebral arteries. Both vertebral arteries are patent to the basilar. No basilar stenosis. Posterior circulation branch vessels are patent. Pronounced atherosclerotic irregularity of the PCA branches. IMPRESSION: 1.5-2 cm acute infarction in the posterior left basal ganglia, posterior limb internal capsule and radiating white matter tracts. No mass effect or hemorrhage. Widespread chronic small-vessel ischemic changes elsewhere throughout the brain as outlined above. No large vessel occlusion or correctable proximal stenosis. Widespread intracranial medium to small vessel atherosclerotic narrowing and irregularity. Electronically Signed   By: Nelson Chimes M.D.   On: 03/22/2017 11:49   Dg Hip Operative Unilat W Or W/o Pelvis Left  Result Date: 03/25/2017 CLINICAL DATA:  84 year old female with left femoral neck fracture undergoing ORIF. EXAM: OPERATIVE left HIP (WITH PELVIS IF PERFORMED) 2 VIEWS TECHNIQUE: Fluoroscopic spot image(s) were submitted for interpretation post-operatively. COMPARISON:   Preoperative radiographs 03/21/2017. FINDINGS: 2 intraoperative fluoroscopic AP spot views of the left hip demonstrate a proximal left femur hemiarthroplasty. The femoral head component appears normally aligned in the AP projection with the acetabulum. No unexpected osseous changes. Bulky calcified pelvic mass is redemonstrated, probably severe fibroid uterus. FLUOROSCOPY TIME:  0 min 5 sec IMPRESSION: Left hip hemiarthroplasty with no adverse features. Electronically Signed   By: Genevie Ann M.D.   On: 03/25/2017 13:07   Dg Femur Min 2 Views Left  Result Date: 03/21/2017 CLINICAL DATA:  Fall with pain EXAM: LEFT FEMUR 2 VIEWS COMPARISON:  None. FINDINGS: Acute displaced left femoral neck fracture with mild varus angulation. No femoral head dislocation. Partially visible calcified pelvic masses. Distal femur appears intact. Degenerative changes at the knee IMPRESSION: Acute displaced left femoral neck fracture Electronically Signed   By: Donavan Foil M.D.   On: 03/21/2017 19:47    PEG tube placement on 03/31/2017  Echo with EF of 60-65%   Subjective: Patient seen and examined at bedside.  Complains of some pain around the PEG tube site.  No overnight fever or vomiting.  Discharge Exam: Vitals:   04/02/17 0449 04/02/17 0820  BP: (!) 169/88 (!) 160/83  Pulse: 92 88  Resp: 16 16  Temp: 98.7 F (37.1 C) 98.7 F (37.1 C)  SpO2: 98% 96%   Vitals:   04/01/17 2021 04/02/17 0007 04/02/17 0449 04/02/17 0820  BP: (!) 153/72 139/82 (!) 169/88 (!) 160/83  Pulse: 92 90 92 88  Resp: _0 Temp: 98.3 F (36.8 C) 98.7 F (37.1 C) 98.7 F (37.1 C) 98.7 F (37.1 C)  TempSrc: Oral Oral Oral Oral  SpO2: 95% 96% 98% 96%  Weight:   54.4 kg (119 lb 14.9 oz)   Height:        General: Pt is alert, awake, not in acute distress Cardiovascular: Rate controlled, S1/S2 + Respiratory: Bilateral decreased breath sounds at bases Abdominal: Soft, NT, ND, bowel sounds +; PEG tube present Extremities:  no edema, no cyanosis.  Right-sided weakness present    The results of significant diagnostics from this hospitalization (including imaging, microbiology, ancillary and laboratory) are listed below for reference.     Microbiology: Recent Results (from the past 240 hour(s))  Surgical PCR screen     Status: None   Collection Time: 03/25/17  6:06 AM  Result Value Ref Range Status   MRSA, PCR NEGATIVE NEGATIVE Final   Staphylococcus aureus NEGATIVE NEGATIVE Final    Comment: (NOTE) The Xpert SA Assay (FDA approved for NASAL specimens in patients 75 years of age and older), is one component of a comprehensive  surveillance program. It is not intended to diagnose infection nor to guide or monitor treatment. Performed at [facility], [place] [address] [place], [place] [address]      Labs: BNP (last 3 results) No results for input(s): BNP in the last 8760 hours. Basic Metabolic Panel: Recent Labs  Lab 03/27/17 0429 03/29/17 0917 03/31/17 0515 04/01/17 0714 04/02/17 0407  NA 139 143 142  --  138  K 3.4* 3.7 3.9  --  4.0  CL 109 109 107  --  104  CO2 20* 24 24  --  23  GLUCOSE 178* 122* 101*  --  173*  BUN 30* 27* 26*  --  26*  CREATININE 1.55* 1.18* 1.08* 1.26* 1.09*  CALCIUM 7.8* 8.4* 8.1*  --  8.0*  MG 1.9 1.7  --   --   --   PHOS 2.2*  --   --   --   --    Liver Function Tests: Recent Labs  Lab 03/29/17 0917  AST 236*  ALT 115*  ALKPHOS 184*  BILITOT 0.6  PROT 6.2*  ALBUMIN 1.9*   No results for input(s): LIPASE, AMYLASE in the last 168 hours. No results for input(s): AMMONIA in the last 168 hours. CBC: Recent Labs  Lab 03/27/17 0429 03/28/17 0314 03/31/17 0749 04/01/17 0714 04/02/17 0407  WBC 6.4 5.5 5.0 7.9 6.3  HGB 9.9* 9.6* 8.7* 8.3* 8.4*  HCT 30.6* 29.9* 27.1* 25.6* 26.3*  MCV 84.5 84.9 85.2 85.6 86.8  PLT 221 237 288 293 311   Cardiac Enzymes: No results for input(s): CKTOTAL, CKMB, CKMBINDEX, TROPONINI in the last 168  hours. BNP: Invalid input(s): POCBNP CBG: Recent Labs  Lab 04/01/17 1714 04/01/17 2013 04/02/17 0026 04/02/17 0432 04/02/17 0840  GLUCAP 163* 140* 153* 172* 110*   D-Dimer No results for input(s): DDIMER in the last 72 hours. Hgb A1c No results for input(s): HGBA1C in the last 72 hours. Lipid Profile No results for input(s): CHOL, HDL, LDLCALC, TRIG, CHOLHDL, LDLDIRECT in the last 72 hours. Thyroid function studies No results for input(s): TSH, T4TOTAL, T3FREE, THYROIDAB in the last 72 hours.  Invalid input(s): FREET3 Anemia work up No results for input(s): VITAMINB12, FOLATE, FERRITIN, TIBC, IRON, RETICCTPCT in the last 72 hours. Urinalysis    Component Value Date/Time   COLORURINE YELLOW 03/22/2017 1528   APPEARANCEUR CLEAR 03/22/2017 1528   LABSPEC 1.012 03/22/2017 1528   PHURINE 7.0 03/22/2017 1528   GLUCOSEU 50 (A) 03/22/2017 1528   HGBUR SMALL (A) 03/22/2017 1528   BILIRUBINUR NEGATIVE 03/22/2017 1528   KETONESUR 5 (A) 03/22/2017 1528   PROTEINUR 30 (A) 03/22/2017 1528   NITRITE NEGATIVE 03/22/2017 1528   LEUKOCYTESUR NEGATIVE 03/22/2017 1528   Sepsis Labs Invalid input(s): PROCALCITONIN,  WBC,  LACTICIDVEN Microbiology Recent Results (from the past 240 hour(s))  Surgical PCR screen     Status: None   Collection Time: 03/25/17  6:06 AM  Result Value Ref Range Status   MRSA, PCR NEGATIVE NEGATIVE Final   Staphylococcus aureus NEGATIVE NEGATIVE Final    Comment: (NOTE) The Xpert SA Assay (FDA approved for NASAL specimens in patients 22 years of age and older), is one component of a comprehensive surveillance program. It is not intended to diagnose infection nor to guide or monitor treatment. Performed at [facility], [place] [address], [place], [place] [address]      Time coordinating discharge: 40 minutes  SIGNED:   Aline August, MD  [facility] 04/02/2017,  9:47 AM Pager: [phone]  If 7PM-7AM, please contact  night-coverage [email or web address] Password [email or web address]

## 2017-04-03 LAB — GLUCOSE, CAPILLARY
GLUCOSE-CAPILLARY: 145 mg/dL — AB (ref 65–99)
Glucose-Capillary: 169 mg/dL — ABNORMAL HIGH (ref 65–99)

## 2017-04-03 NOTE — Clinical Social Work Note (Addendum)
CSW has started bed search for facilities that have offered and can do the tube feeds today. Spoke with pt's daughter at bedside to update her on the search. If unable to find a facility today to take pt, [facility] will take pt tomorrow.  [facility]-- can not take today [facility]-- can not take today [place]--can not take today [facility]--no beds  [place]--no beds [place]--no beds  [place], [place]

## 2017-04-03 NOTE — Progress Notes (Signed)
Patient ID: Alyssa Savage, female   DOB: 1930/09/14, 84 y.o.   MRN: [id number] Patient was supposed to be discharged yesterday to nursing home.  This could not happen because the nursing home did not have capabilities to continue PEG tube feedings over the weekend.  Patient will be discharged to nursing home once that will be available.  Social worker following.  Patient seen and examined at bedside and discussed with the patient's daughter.  Please refer to the full discharge summary done by me on 04/02/2017 for full details.

## 2017-04-04 LAB — ALDOSTERONE + RENIN ACTIVITY W/ RATIO
ALDO / PRA Ratio: <4.8 (ref 0.0–30.0)
Aldosterone: <1 ng/dL (ref 0.0–30.0)
PRA LC/MS/MS: 0.209 ng/mL/hr (ref 0.167–5.380)

## 2017-04-04 LAB — GLUCOSE, CAPILLARY
GLUCOSE-CAPILLARY: 144 mg/dL — AB (ref 65–99)
GLUCOSE-CAPILLARY: 145 mg/dL — AB (ref 65–99)
GLUCOSE-CAPILLARY: 146 mg/dL — AB (ref 65–99)
GLUCOSE-CAPILLARY: 167 mg/dL — AB (ref 65–99)

## 2017-04-04 NOTE — Care Management Note (Signed)
Case Management Note  Patient Details  Name: Astella Desir MRN: [id number] Date of Birth: 05-Sep-1930  Subjective/Objective:                    Action/Plan: Pt is discharging to [facility] today. No further needs per CM.   Expected Discharge Date:  04/02/17               Expected Discharge Plan:  Skilled Nursing Facility  In-House Referral:  Clinical Social Work  Discharge planning Services     Post Acute Care Choice:    Choice offered to:     DME Arranged:    DME Agency:     HH Arranged:    [place] Agency:     Status of Service:  Completed, signed off  If discussed at [employer], dates discussed:    Additional Comments:  [name], RN 04/04/2017, 10:30 AM

## 2017-04-04 NOTE — Clinical Social Work Placement (Addendum)
Nurse to call report to [phone], Room 121B   Transport set up for 1:00 PM.   CLINICAL SOCIAL WORK PLACEMENT  NOTE  Date:  04/04/2017  Patient Details  Name: Alyssa Savage MRN: [id number] Date of Birth: [birth year]  Clinical Social Work is seeking post-discharge placement for this patient at the [place] level of care (*CSW will initial, date and re-position this form in  chart as items are completed):  Yes   Patient/family provided with [facility] Work Department's list of facilities offering this level of care within the geographic area requested by the patient (or if unable, by the patient's family).  Yes   Patient/family informed of their freedom to choose among providers that offer the needed level of care, that participate in Medicare, Medicaid or managed care program needed by the patient, have an available bed and are willing to accept the patient.  Yes   Patient/family informed of [facility]'s ownership interest in [facility] and [facility], as well as of the fact that they are under no obligation to receive care at these facilities.  PASRR submitted to EDS on       PASRR number received on       Existing PASRR number confirmed on       FL2 transmitted to all facilities in geographic area requested by pt/family on 03/28/17     FL2 transmitted to all facilities within larger geographic area on       Patient informed that his/her managed care company has contracts with or will negotiate with certain facilities, including the following:        Yes   Patient/family informed of bed offers received.  Patient chooses bed at [facility]     Physician recommends and patient chooses bed at      Patient to be transferred to [facility] on 04/04/17.  Patient to be transferred to facility by [facility]     Patient family notified on 04/04/17 of transfer.  Name of family member notified:  Daughter     PHYSICIAN        Additional Comment:    _______________________________________________ Geralynn Ochs, LCSW 04/04/2017, 11:30 AM

## 2017-04-04 NOTE — Progress Notes (Signed)
Nurse attempted to call report. Nurse never cam e to the phone. I will try back.

## 2017-04-04 NOTE — Progress Notes (Signed)
Patient discharging to [place] care   Transported by [facility]  Patient mother took all patient belongings.  Nurse called report

## 2017-04-04 NOTE — Progress Notes (Signed)
Patient ID: Alyssa Savage, female   DOB: 07/11/1930, 84 y.o.   MRN: [id number] Patient still waiting to be discharged to nursing home as she could not be discharged over the weekend because of the nursing home not having capabilities to continue PEG tube feedings over the weekend.  Patient will be discharged to nursing home once this is arranged.  Social worker following.  Patient seen and examined at bedside.  Please refer to the full discharge summary done by me on 04/02/2017 for full details.

## 2017-04-05 ENCOUNTER — Encounter: Payer: Self-pay | Admitting: Cardiology

## 2017-04-13 ENCOUNTER — Ambulatory Visit (INDEPENDENT_AMBULATORY_CARE_PROVIDER_SITE_OTHER): Payer: Medicaid Other | Admitting: Physician Assistant

## 2017-04-19 ENCOUNTER — Encounter (HOSPITAL_COMMUNITY): Payer: Self-pay | Admitting: Emergency Medicine

## 2017-04-19 ENCOUNTER — Emergency Department (HOSPITAL_COMMUNITY): Payer: Medicare Other

## 2017-04-19 ENCOUNTER — Inpatient Hospital Stay (HOSPITAL_COMMUNITY)
Admission: EM | Admit: 2017-04-19 | Discharge: 2017-04-25 | DRG: 064 | Disposition: A | Discharge status: Skilled Nursing Facility | Payer: Medicare Other | Attending: Internal Medicine | Admitting: Internal Medicine

## 2017-04-19 DIAGNOSIS — Z7982 Long term (current) use of aspirin: Secondary | ICD-10-CM

## 2017-04-19 DIAGNOSIS — I69351 Hemiplegia and hemiparesis following cerebral infarction affecting right dominant side: Secondary | ICD-10-CM

## 2017-04-19 DIAGNOSIS — Z6822 Body mass index (BMI) 22.0-22.9, adult: Secondary | ICD-10-CM

## 2017-04-19 DIAGNOSIS — N183 Chronic kidney disease, stage 3 unspecified: Secondary | ICD-10-CM

## 2017-04-19 DIAGNOSIS — R4182 Altered mental status, unspecified: Secondary | ICD-10-CM | POA: Diagnosis not present

## 2017-04-19 DIAGNOSIS — G9340 Encephalopathy, unspecified: Secondary | ICD-10-CM | POA: Diagnosis present

## 2017-04-19 DIAGNOSIS — R29723 NIHSS score 23: Secondary | ICD-10-CM | POA: Diagnosis present

## 2017-04-19 DIAGNOSIS — E43 Unspecified severe protein-calorie malnutrition: Secondary | ICD-10-CM | POA: Diagnosis present

## 2017-04-19 DIAGNOSIS — N390 Urinary tract infection, site not specified: Secondary | ICD-10-CM | POA: Diagnosis present

## 2017-04-19 DIAGNOSIS — E1122 Type 2 diabetes mellitus with diabetic chronic kidney disease: Secondary | ICD-10-CM | POA: Diagnosis present

## 2017-04-19 DIAGNOSIS — R1310 Dysphagia, unspecified: Secondary | ICD-10-CM

## 2017-04-19 DIAGNOSIS — I639 Cerebral infarction, unspecified: Secondary | ICD-10-CM | POA: Diagnosis not present

## 2017-04-19 DIAGNOSIS — R4701 Aphasia: Secondary | ICD-10-CM | POA: Diagnosis present

## 2017-04-19 DIAGNOSIS — I129 Hypertensive chronic kidney disease with stage 1 through stage 4 chronic kidney disease, or unspecified chronic kidney disease: Secondary | ICD-10-CM | POA: Diagnosis present

## 2017-04-19 DIAGNOSIS — G8250 Quadriplegia, unspecified: Secondary | ICD-10-CM | POA: Diagnosis present

## 2017-04-19 DIAGNOSIS — Z96642 Presence of left artificial hip joint: Secondary | ICD-10-CM | POA: Diagnosis present

## 2017-04-19 DIAGNOSIS — Z79899 Other long term (current) drug therapy: Secondary | ICD-10-CM

## 2017-04-19 DIAGNOSIS — Z931 Gastrostomy status: Secondary | ICD-10-CM

## 2017-04-19 DIAGNOSIS — I6302 Cerebral infarction due to thrombosis of basilar artery: Secondary | ICD-10-CM

## 2017-04-19 DIAGNOSIS — E785 Hyperlipidemia, unspecified: Secondary | ICD-10-CM | POA: Diagnosis present

## 2017-04-19 DIAGNOSIS — K219 Gastro-esophageal reflux disease without esophagitis: Secondary | ICD-10-CM | POA: Diagnosis present

## 2017-04-19 DIAGNOSIS — G9341 Metabolic encephalopathy: Secondary | ICD-10-CM | POA: Diagnosis present

## 2017-04-19 LAB — CBC WITH DIFFERENTIAL/PLATELET
Basophils Absolute: 0 10*3/uL (ref 0.0–0.1)
Basophils Relative: 1 %
EOS ABS: 0.2 10*3/uL (ref 0.0–0.7)
Eosinophils Relative: 4 %
HEMATOCRIT: 35.6 % — AB (ref 36.0–46.0)
HEMOGLOBIN: 11.2 g/dL — AB (ref 12.0–15.0)
LYMPHS ABS: 1.2 10*3/uL (ref 0.7–4.0)
Lymphocytes Relative: 25 %
MCH: 27.8 pg (ref 26.0–34.0)
MCHC: 31.5 g/dL (ref 30.0–36.0)
MCV: 88.3 fL (ref 78.0–100.0)
Monocytes Absolute: 0.4 10*3/uL (ref 0.1–1.0)
Monocytes Relative: 7 %
NEUTROS PCT: 63 %
Neutro Abs: 3 10*3/uL (ref 1.7–7.7)
Platelets: 272 10*3/uL (ref 150–400)
RBC: 4.03 MIL/uL (ref 3.87–5.11)
RDW: 17 % — ABNORMAL HIGH (ref 11.5–15.5)
WBC: 4.8 10*3/uL (ref 4.0–10.5)

## 2017-04-19 LAB — I-STAT CHEM 8, ED
BUN: 23 mg/dL — ABNORMAL HIGH (ref 6–20)
CALCIUM ION: 1.18 mmol/L (ref 1.15–1.40)
Chloride: 106 mmol/L (ref 101–111)
Creatinine, Ser: 1 mg/dL (ref 0.44–1.00)
Glucose, Bld: 83 mg/dL (ref 65–99)
HEMATOCRIT: 31 % — AB (ref 36.0–46.0)
Hemoglobin: 10.5 g/dL — ABNORMAL LOW (ref 12.0–15.0)
Potassium: 3.9 mmol/L (ref 3.5–5.1)
SODIUM: 142 mmol/L (ref 135–145)
TCO2: 26 mmol/L (ref 22–32)

## 2017-04-19 LAB — URINALYSIS, ROUTINE W REFLEX MICROSCOPIC
BILIRUBIN URINE: NEGATIVE
GLUCOSE, UA: NEGATIVE mg/dL
HGB URINE DIPSTICK: NEGATIVE
KETONES UR: NEGATIVE mg/dL
Nitrite: NEGATIVE
PH: 7 (ref 5.0–8.0)
Protein, ur: NEGATIVE mg/dL
Specific Gravity, Urine: 1.015 (ref 1.005–1.030)

## 2017-04-19 LAB — COMPREHENSIVE METABOLIC PANEL
ALBUMIN: 2.1 g/dL — AB (ref 3.5–5.0)
ALT: 117 U/L — ABNORMAL HIGH (ref 14–54)
ANION GAP: 6 (ref 5–15)
AST: 75 U/L — AB (ref 15–41)
Alkaline Phosphatase: 103 U/L (ref 38–126)
BILIRUBIN TOTAL: 0.3 mg/dL (ref 0.3–1.2)
BUN: 21 mg/dL — AB (ref 6–20)
CO2: 24 mmol/L (ref 22–32)
Calcium: 8.4 mg/dL — ABNORMAL LOW (ref 8.9–10.3)
Chloride: 109 mmol/L (ref 101–111)
Creatinine, Ser: 1 mg/dL (ref 0.44–1.00)
GFR calc Af Amer: 58 mL/min — ABNORMAL LOW (ref >60)
GFR calc non Af Amer: 50 mL/min — ABNORMAL LOW (ref >60)
GLUCOSE: 89 mg/dL (ref 65–99)
Potassium: 3.9 mmol/L (ref 3.5–5.1)
Sodium: 139 mmol/L (ref 135–145)
TOTAL PROTEIN: 5.8 g/dL — AB (ref 6.5–8.1)

## 2017-04-19 LAB — AMMONIA: AMMONIA: 22 umol/L (ref 9–35)

## 2017-04-19 LAB — VITAMIN B12: Vitamin B-12: 654 pg/mL (ref 180–914)

## 2017-04-19 LAB — ETHANOL: Alcohol, Ethyl (B): <10 mg/dL (ref <10)

## 2017-04-19 LAB — CBG MONITORING, ED: Glucose-Capillary: 89 mg/dL (ref 65–99)

## 2017-04-19 LAB — I-STAT CG4 LACTIC ACID, ED: Lactic Acid, Venous: 1 mmol/L (ref 0.5–1.9)

## 2017-04-19 MED ORDER — TRAZODONE HCL 50 MG PO TABS
25.0000 mg | ORAL_TABLET | Freq: Every evening | ORAL | Status: DC | PRN
  Administered 2017-04-19: 25 mg via ORAL
  Filled 2017-04-19: qty 1

## 2017-04-19 MED ORDER — ACETAMINOPHEN 325 MG PO TABS: 650.0000 mg | ORAL_TABLET | Freq: Four times a day (QID) | ORAL | Status: DC | PRN

## 2017-04-19 MED ORDER — SODIUM CHLORIDE 0.9 % IV SOLN
1.0000 g | INTRAVENOUS | Status: DC
  Filled 2017-04-19: qty 10

## 2017-04-19 MED ORDER — CLOTRIMAZOLE 1 % VA CREA
1.0000 | TOPICAL_CREAM | Freq: Every day | VAGINAL | Status: DC
  Administered 2017-04-19 – 2017-04-24 (×5): 1 via VAGINAL
  Filled 2017-04-19: qty 45

## 2017-04-19 MED ORDER — MAGNESIUM SULFATE 2 GM/50ML IV SOLN
2.0000 g | Freq: Once | INTRAVENOUS | Status: DC
  Filled 2017-04-19: qty 50

## 2017-04-19 MED ORDER — CARVEDILOL 3.125 MG PO TABS
3.1250 mg | ORAL_TABLET | Freq: Two times a day (BID) | ORAL | Status: DC
  Administered 2017-04-19 – 2017-04-25 (×12): 3.125 mg
  Filled 2017-04-19 (×12): qty 1

## 2017-04-19 MED ORDER — HYDRALAZINE HCL 20 MG/ML IJ SOLN
5.0000 mg | INTRAMUSCULAR | Status: DC | PRN
Start: 2017-04-19 — End: 2017-04-25
  Administered 2017-04-19 – 2017-04-24 (×4): 5 mg via INTRAVENOUS
  Filled 2017-04-19 (×4): qty 1

## 2017-04-19 MED ORDER — FREE WATER
200.0000 mL | Freq: Four times a day (QID) | Status: DC
  Administered 2017-04-19 – 2017-04-20 (×3): 200 mL

## 2017-04-19 MED ORDER — ASPIRIN 325 MG PO TABS
325.0000 mg | ORAL_TABLET | Freq: Every day | ORAL | Status: DC
  Administered 2017-04-19 – 2017-04-21 (×3): 325 mg
  Filled 2017-04-19 (×3): qty 1

## 2017-04-19 MED ORDER — SODIUM CHLORIDE 0.9 % IV SOLN
1.0000 g | Freq: Once | INTRAVENOUS | Status: AC
  Administered 2017-04-19: 1 g via INTRAVENOUS
  Filled 2017-04-19: qty 10

## 2017-04-19 MED ORDER — POLYVINYL ALCOHOL 1.4 % OP SOLN
2.0000 [drp] | OPHTHALMIC | Status: DC | PRN
  Filled 2017-04-19: qty 15

## 2017-04-19 MED ORDER — ENOXAPARIN SODIUM 30 MG/0.3ML ~~LOC~~ SOLN
30.0000 mg | SUBCUTANEOUS | Status: DC
  Administered 2017-04-20 – 2017-04-25 (×6): 30 mg via SUBCUTANEOUS
  Filled 2017-04-19 (×6): qty 0.3

## 2017-04-19 MED ORDER — SENNOSIDES-DOCUSATE SODIUM 8.6-50 MG PO TABS: 1.0000 | ORAL_TABLET | Freq: Every evening | ORAL | Status: DC | PRN

## 2017-04-19 MED ORDER — SODIUM CHLORIDE 0.9 % IV SOLN
INTRAVENOUS | Status: AC
  Administered 2017-04-19: 19:00:00 via INTRAVENOUS

## 2017-04-19 MED ORDER — ONDANSETRON HCL 4 MG PO TABS: 4.0000 mg | ORAL_TABLET | Freq: Four times a day (QID) | ORAL | Status: DC | PRN

## 2017-04-19 MED ORDER — ACETAMINOPHEN 650 MG RE SUPP: 650.0000 mg | Freq: Four times a day (QID) | RECTAL | Status: DC | PRN

## 2017-04-19 MED ORDER — ATORVASTATIN CALCIUM 40 MG PO TABS
40.0000 mg | ORAL_TABLET | Freq: Every day | ORAL | Status: DC
  Administered 2017-04-19 – 2017-04-24 (×6): 40 mg
  Filled 2017-04-19 (×6): qty 1

## 2017-04-19 MED ORDER — ONDANSETRON HCL 4 MG/2ML IJ SOLN: 4.0000 mg | Freq: Four times a day (QID) | INTRAMUSCULAR | Status: DC | PRN

## 2017-04-19 MED ORDER — CARBOXYMETHYLCELLULOSE SODIUM 0.5 % OP SOLN: 1.0000 [drp] | Freq: Three times a day (TID) | OPHTHALMIC | Status: DC | PRN

## 2017-04-19 MED ORDER — HYDROCODONE-ACETAMINOPHEN 5-325 MG PO TABS: 1.0000 | ORAL_TABLET | Freq: Four times a day (QID) | ORAL | Status: DC | PRN

## 2017-04-19 NOTE — ED Provider Notes (Signed)
[facility] EMERGENCY DEPARTMENT Provider Note   CSN: [id number] Arrival date & time: 04/19/17  1355     History   Chief Complaint Chief Complaint  Patient presents with  . Altered Mental Status    HPI Alyssa Savage is a 84 y.o. female.  Level 5 caveat due to altered mental status.  HPI   Alyssa Savage is a 84 y.o. female, with a history of stroke, HTN, GERD, and femoral neck fracture, presenting to the ED from [place] with reported altered mental status.  Last seen normal March 15.  They state patient just stopped talking.  Patient was reportedly given IV fluids at the facility. Patient is able to nod or shake her head in response to questions.  She indicates she is not experience any pain currently, including chest pain, abdominal pain, or headache.  She indicates she is not experiencing shortness of breath.    Past Medical History:  Diagnosis Date  . Femoral neck fracture ([place]) 03/2017   left   . GERD (gastroesophageal reflux disease)   . Hypertensive emergency 03/2017  . Hypokalemia 03/2017  . Stroke [facility])     Patient Active Problem List   Diagnosis Date Noted  . Displaced fracture of left femoral neck ([place]) 03/25/2017  . Protein-calorie malnutrition, severe 03/24/2017  . Fall 03/21/2017  . Fracture of femoral neck, left, closed ([place]) 03/21/2017  . AKI (acute kidney injury) ([place]) 03/21/2017  . Hypertensive emergency 03/21/2017  . Elevated troponin 03/21/2017  . Hypokalemia 03/21/2017  . Stroke ([place]) 03/21/2017  . GERD (gastroesophageal reflux disease)   . Closed fracture of left hip ([place])   . Prolonged Q-T interval on ECG     Past Surgical History:  Procedure Laterality Date  . ANTERIOR APPROACH HEMI HIP ARTHROPLASTY Left 03/25/2017   Procedure: ANTERIOR APPROACH HEMI HIP ARTHROPLASTY;  Surgeon: Rod Can, MD;  Location: [facility];  Service: Orthopedics;  Laterality: Left;  . IR GASTROSTOMY TUBE MOD SED  03/31/2017    . NO PAST SURGERIES      OB History    No data available       Home Medications    Prior to Admission medications   Medication Sig Start Date End Date Taking? Authorizing Provider  Amino Acids-Protein Hydrolys (FEEDING SUPPLEMENT, PRO-STAT SUGAR FREE 64,) LIQD Place 30 mLs into feeding tube daily.   Yes [provider]  aspirin 325 MG tablet Place 1 tablet (325 mg total) into feeding tube daily. 04/03/17  Yes Aline August, MD  atorvastatin (LIPITOR) 40 MG tablet Place 1 tablet (40 mg total) into feeding tube daily at 6 PM. 04/02/17  Yes Alekh, Kshitiz, MD  carboxymethylcellulose (REFRESH PLUS) 0.5 % SOLN Place 1 drop into both eyes every 8 (eight) hours as needed (dry eyes).   Yes [provider]  carvedilol (COREG) 3.125 MG tablet Place 1 tablet (3.125 mg total) into feeding tube 2 (two) times daily with a meal. 04/02/17  Yes Alekh, Kshitiz, MD  enoxaparin (LOVENOX) 30 MG/0.3ML injection Inject 0.3 mLs (30 mg total) into the skin daily. 03/28/17  Yes Swinteck, Aaron Edelman, MD  HYDROcodone-acetaminophen (NORCO/VICODIN) 5-325 MG tablet Take 1-2 tablets by mouth every 6 (six) hours as needed. Patient taking differently: Place 1-2 tablets into feeding tube every 6 (six) hours as needed (pain).  03/28/17  Yes Swinteck, Aaron Edelman, MD  methocarbamol (ROBAXIN) 500 MG tablet Place 1 tablet (500 mg total) into feeding tube every 8 (eight) hours as needed for muscle spasms. 04/02/17  Yes Aline August, MD  nitroGLYCERIN (NITROSTAT) 0.4 MG SL tablet Place 1 tablet (0.4 mg total) under the tongue every 5 (five) minutes as needed for chest pain. 04/02/17  Yes Aline August, MD  Nutritional Supplements (FEEDING SUPPLEMENT, OSMOLITE 1.2 CAL,) LIQD Place 1,000 mLs into feeding tube continuous. Patient taking differently: Place 237 mLs into feeding tube 4 (four) times daily.  04/02/17  Yes Aline August, MD  senna-docusate (SENOKOT-S) 8.6-50 MG tablet Place 1 tablet into feeding tube at bedtime as needed  for mild constipation. 04/02/17  Yes Aline August, MD  Water For Irrigation, Sterile (FREE WATER) SOLN Place 200 mLs into feeding tube every 6 (six) hours. Patient taking differently: Place 200 mLs into feeding tube 4 (four) times daily.  04/02/17  Yes Aline August, MD  hydroxypropyl methylcellulose / hypromellose (ISOPTO TEARS / GONIOVISC) 2.5 % ophthalmic solution Place 1 drop into both eyes 3 (three) times daily as needed for dry eyes. Patient not taking: Reported on 04/19/2017 04/02/17   Aline August, MD    Family History Family History  Problem Relation Age of Onset  . CAD Mother   . Heart disease Mother     Social History Social History   Tobacco Use  . Smoking status: Never Smoker  . Smokeless tobacco: Never Used  Substance Use Topics  . Alcohol use: No    Frequency: Never  . Drug use: No     Allergies   Patient has no known allergies.   Review of Systems Review of Systems  Unable to perform ROS: Mental status change     Physical Exam Updated Vital Signs BP (!) 151/88   Pulse 93   Temp 99 F (37.2 C) (Rectal)   Resp (!) 25   SpO2 98%   Physical Exam  Constitutional: She appears well-developed and well-nourished. No distress.  HENT:  Head: Normocephalic and atraumatic.  Mouth/Throat: Oropharynx is clear and moist.  Eyes: Conjunctivae and EOM are normal. Pupils are equal, round, and reactive to light.  Neck: Neck supple.  Cardiovascular: Normal rate, regular rhythm, normal heart sounds and intact distal pulses.  Pulmonary/Chest: Effort normal and breath sounds normal. No respiratory distress.  Abdominal: Soft. There is no tenderness. There is no guarding.  PEG tube noted.  Musculoskeletal: She exhibits edema.  Bilateral peripheral pitting edema.  Patient is noted to have bandage overlying surgical incision on the left anterior hip.  Bandages dated 04/18/2017.  Incision appears clean without swelling, erythema, or purulence.  Lymphadenopathy:    She has  no cervical adenopathy.  Neurological: She is alert.  Patient is able to nod or shake her head in response to questions. She has questionable right sided facial droop. Her muscle tone is flaccid on the right in the upper and lower extremities.   She has motor function intact in the left upper and lower extremities.  Skin: Skin is warm and dry. She is not diaphoretic.  Psychiatric: She has a normal mood and affect. Her behavior is normal.  Nursing note and vitals reviewed.    ED Treatments / Results  Labs (all labs ordered are listed, but only abnormal results are displayed) Labs Reviewed  COMPREHENSIVE METABOLIC PANEL - Abnormal; Notable for the following components:      Result Value   BUN 21 (*)    Calcium 8.4 (*)    Total Protein 5.8 (*)    Albumin 2.1 (*)    AST 75 (*)    ALT 117 (*)    GFR  calc non Af Amer 50 (*)    GFR calc Af Amer 58 (*)    All other components within normal limits  CBC WITH DIFFERENTIAL/PLATELET - Abnormal; Notable for the following components:   Hemoglobin 11.2 (*)    HCT 35.6 (*)    RDW 17.0 (*)    All other components within normal limits  URINALYSIS, ROUTINE W REFLEX MICROSCOPIC - Abnormal; Notable for the following components:   APPearance HAZY (*)    Leukocytes, UA SMALL (*)    Bacteria, UA RARE (*)    Squamous Epithelial / LPF 0-5 (*)    All other components within normal limits  I-STAT CHEM 8, ED - Abnormal; Notable for the following components:   BUN 23 (*)    Hemoglobin 10.5 (*)    HCT 31.0 (*)    All other components within normal limits  URINE CULTURE  CULTURE, BLOOD (ROUTINE X 2)  CULTURE, BLOOD (ROUTINE X 2)  AMMONIA  ETHANOL  CBG MONITORING, ED  I-STAT CG4 LACTIC ACID, ED    EKG  EKG Interpretation  Date/Time:  Tuesday April 19 2017 15:31:00 EDT Ventricular Rate:  93 PR Interval:    QRS Duration: 79 QT Interval:  420 QTC Calculation: 523 R Axis:   -33 Text Interpretation:  Sinus rhythm Probable left atrial  enlargement Abnormal R-wave progression, early transition Left ventricular hypertrophy Prolonged QT interval Confirmed by Virgel Manifold [phone]) on 04/19/2017 3:50:19 PM       Radiology Ct Head Wo Contrast  Result Date: 04/19/2017 CLINICAL DATA:  Altered mental status EXAM: CT HEAD WITHOUT CONTRAST TECHNIQUE: Contiguous axial images were obtained from the base of the skull through the vertex without intravenous contrast. COMPARISON:  Brain MRI 03/22/2017 FINDINGS: Brain: No mass lesion, intraparenchymal hemorrhage or extra-axial collection. No evidence of acute cortical infarct. There is periventricular hypoattenuation compatible with chronic microvascular disease. Old left basal ganglia lacunar infarct. Vascular: No hyperdense vessel or unexpected vascular calcification. Skull: Normal visualized skull base, calvarium and extracranial soft tissues. Sinuses/Orbits: No sinus fluid levels or advanced mucosal thickening. No mastoid effusion. Normal orbits. IMPRESSION: Chronic ischemic microangiopathy without acute intracranial abnormality. Old left gangliocapsular lacunar infarct. Electronically Signed   By: Ulyses Jarred M.D.   On: 04/19/2017 15:38   Dg Chest Port 1 View  Result Date: 04/19/2017 CLINICAL DATA:  Loss of consciousness.  Right-sided weakness. EXAM: PORTABLE CHEST 1 VIEW COMPARISON:  Chest x-ray 03/21/2017. FINDINGS: Cardiomegaly with normal pulmonary vascularity. No focal infiltrate. No pleural effusion or pneumothorax. Degenerative change thoracic spine. IMPRESSION: 1.  Cardiomegaly with normal pulmonary vascularity. 2.  No acute pulmonary disease. Electronically Signed   By: Marcello Moores  Register   On: 04/19/2017 14:33    Procedures Procedures (including critical care time)  Medications Ordered in ED Medications  cefTRIAXone (ROCEPHIN) 1 g in sodium chloride 0.9 % 100 mL IVPB (not administered)     Initial Impression / Assessment and Plan / ED Course  I have reviewed the triage vital  signs and the nursing notes.  Pertinent labs & imaging results that were available during my care of the patient were reviewed by me and considered in my medical decision making (see chart for details).     Patient presents with reported altered mental status.  Patient is nonverbal on exam.  Other than her nonverbal status, patient seems to have a neurologic exam consistent with reports of her stroke deficits noted in February.  End of shift patient care handoff report given to [facility],  PA-C. Plan to have patient admitted for altered mental status.    Final Clinical Impressions(s) / ED Diagnoses   Final diagnoses:  None    ED Discharge Orders    None       Layla Maw 04/19/17 [place], [place], DO 04/20/17 1504

## 2017-04-19 NOTE — ED Provider Notes (Signed)
Care assumed from previous provider PA Joy. Please see note for further details. Briefly, patient is a 84 y.o. female who presents from nursing home for altered mental status. Case discussed, plan agreed upon. Will follow up on pending labs and head CT. Likely will need admission.    Labs reviewed: WBC count 4.8. Lactic wdl. UA with TNTC white cells, small leuks, rare bacteria. Does have yeast in urine sample as well. Urine cx obtained.   CT head without acute changes.   Patient evaluated. Non-verbal. Drooling from the right side of the mouth. Per chart review, she does have hx of previous left-sided infarct with right-sided deficits. Uncertain if right-sided weakness noticed on my exam is residual vs. New. MRI ordered for further evaluation. Hospitalist consulted who will admit.    Sonali Wivell, Ozella Almond, PA-C 04/19/17 1724    Virgel Manifold, MD 04/19/17 1932

## 2017-04-19 NOTE — ED Notes (Signed)
Patient transported to CT 

## 2017-04-19 NOTE — ED Triage Notes (Signed)
Pt here from [facility] with c/o aloc times days , pt arrived by ems , ems states that pt would answer yes and no but that was all she would say

## 2017-04-19 NOTE — H&P (Signed)
History and Physical    Alyssa Savage KDX:[id number] DOB: 1930-12-24 DOA: 04/19/2017  PCP: Patient, No Pcp Per Patient coming from: [facility]   Chief Complaint: acute encephalopathy  HPI: Alyssa Savage is a 84 y.o. female with medical history significant recent stroke assaulting in right-sided weakness severe dysphagia PEG tube, hypertension, GERD,femoral neck fracture presents to the emergency department with chief complaint of altered mental status. Initial evaluation concerning for urinary tract infection and possible neurological event. [facility] are asked to admit  Information is obtained from the chart and the patient noting that information from patient may be unreliable she's become nonverbal but is alert. Last month patient has stroke as well as hip fracture was treated and then was discharged to facility with right-sided deficits severe dysphagia that she could not pass modified barium swallow and therefore had a PEG tube.  Reportedly she is able to communicate until 2 days ago. Staff at facility indicate that she "just stop talking". No report of any fever nausea vomiting diarrhea. No report of any complaints of pain shortness of breath. She was provided IV fluids at the facility. No record of any dysuria hematuria frequency or urgency.    ED Course: in the emergency department asked temp is 99 rectally she's hypertensive otherwise hemodynamically stable and not hypoxic. She is provided with IV fluids and Rocephin  Review of Systems: As per HPI otherwise all other systems reviewed and are negative.   Ambulatory Status:bedbound full assist  Past Medical History:  Diagnosis Date  . Femoral neck fracture ([place]) 03/2017   left   . GERD (gastroesophageal reflux disease)   . Hypertensive emergency 03/2017  . Hypokalemia 03/2017  . Stroke [facility])     Past Surgical History:  Procedure Laterality Date  . ANTERIOR APPROACH HEMI HIP ARTHROPLASTY Left 03/25/2017     Procedure: ANTERIOR APPROACH HEMI HIP ARTHROPLASTY;  Surgeon: Rod Can, MD;  Location: [facility];  Service: Orthopedics;  Laterality: Left;  . IR GASTROSTOMY TUBE MOD SED  03/31/2017  . NO PAST SURGERIES      Social History   Socioeconomic History  . Marital status: Widowed    Spouse name: Not on file  . Number of children: Not on file  . Years of education: Not on file  . Highest education level: Not on file  Social Needs  . Financial resource strain: Not on file  . Food insecurity - worry: Not on file  . Food insecurity - inability: Not on file  . Transportation needs - medical: Not on file  . Transportation needs - non-medical: Not on file  Occupational History  . Not on file  Tobacco Use  . Smoking status: Never Smoker  . Smokeless tobacco: Never Used  Substance and Sexual Activity  . Alcohol use: No    Frequency: Never  . Drug use: No  . Sexual activity: Not on file  Other Topics Concern  . Not on file  Social History Narrative  . Not on file    No Known Allergies  Family History  Problem Relation Age of Onset  . CAD Mother   . Heart disease Mother     Prior to Admission medications   Medication Sig Start Date End Date Taking? Authorizing Provider  Amino Acids-Protein Hydrolys (FEEDING SUPPLEMENT, PRO-STAT SUGAR FREE 64,) LIQD Place 30 mLs into feeding tube daily.   Yes [provider]  aspirin 325 MG tablet Place 1 tablet (325 mg total) into feeding tube daily. 04/03/17  Yes  Aline August, MD  atorvastatin (LIPITOR) 40 MG tablet Place 1 tablet (40 mg total) into feeding tube daily at 6 PM. 04/02/17  Yes Alekh, Kshitiz, MD  carboxymethylcellulose (REFRESH PLUS) 0.5 % SOLN Place 1 drop into both eyes every 8 (eight) hours as needed (dry eyes).   Yes [provider]  carvedilol (COREG) 3.125 MG tablet Place 1 tablet (3.125 mg total) into feeding tube 2 (two) times daily with a meal. 04/02/17  Yes Alekh, Kshitiz, MD  enoxaparin (LOVENOX) 30  MG/0.3ML injection Inject 0.3 mLs (30 mg total) into the skin daily. 03/28/17  Yes Swinteck, Aaron Edelman, MD  HYDROcodone-acetaminophen (NORCO/VICODIN) 5-325 MG tablet Take 1-2 tablets by mouth every 6 (six) hours as needed. Patient taking differently: Place 1-2 tablets into feeding tube every 6 (six) hours as needed (pain).  03/28/17  Yes Swinteck, Aaron Edelman, MD  methocarbamol (ROBAXIN) 500 MG tablet Place 1 tablet (500 mg total) into feeding tube every 8 (eight) hours as needed for muscle spasms. 04/02/17  Yes Aline August, MD  nitroGLYCERIN (NITROSTAT) 0.4 MG SL tablet Place 1 tablet (0.4 mg total) under the tongue every 5 (five) minutes as needed for chest pain. 04/02/17  Yes Aline August, MD  Nutritional Supplements (FEEDING SUPPLEMENT, OSMOLITE 1.2 CAL,) LIQD Place 1,000 mLs into feeding tube continuous. Patient taking differently: Place 237 mLs into feeding tube 4 (four) times daily.  04/02/17  Yes Aline August, MD  senna-docusate (SENOKOT-S) 8.6-50 MG tablet Place 1 tablet into feeding tube at bedtime as needed for mild constipation. 04/02/17  Yes Aline August, MD  Water For Irrigation, Sterile (FREE WATER) SOLN Place 200 mLs into feeding tube every 6 (six) hours. Patient taking differently: Place 200 mLs into feeding tube 4 (four) times daily.  04/02/17  Yes Aline August, MD    Physical Exam: Vitals:   04/19/17 1414 04/19/17 1445 04/19/17 1515 04/19/17 1715  BP: (!) 161/112 (!) 151/88 (!) 157/104 (!) 160/112  Pulse: 92 93 89 94  Resp: (!) 24 (!) 25 18 14   Temp: 99 F (37.2 C)     TempSrc: Rectal     SpO2: 98% 98% 99% 98%     General:  Appears calm and comfortable alert in no acute distress Eyes:  PERRL, EOMI, normal lids, iris ENT:  grossly normal hearing, lips & tongue, mucous membranes of her mouth are pink and moist Neck:  no LAD, masses or thyromegaly Cardiovascular:  RRR, no m/r/g. 1+ lower extremity edema Respiratory:  CTA bilaterally, no w/r/r. Normal respiratory effort. Abdomen:   soft, ntnd, positive bowel sounds throughout PEG tube noted right without erythema or swelling Skin:  no rash or induration seen on limited exam surgical site left anterior hip clean and dry Musculoskeletal:  grossly normal tone BUE/BLE, good ROM, no bony abnormality Psychiatric:  grossly normal mood and affect, speech fluent and appropriate, AOx3 Neurologic:  Alert. Attempts to follow commands. Able to move left arm and left leg. Right arm and leg flaccid. Will smile and nod to simple questions.  Labs on Admission: I have personally reviewed following labs and imaging studies  CBC: Recent Labs  Lab 04/19/17 1421 04/19/17 1516  WBC 4.8  --   NEUTROABS 3.0  --   HGB 11.2* 10.5*  HCT 35.6* 31.0*  MCV 88.3  --   PLT 272  --    Basic Metabolic Panel: Recent Labs  Lab 04/19/17 1421 04/19/17 1516  NA 139 142  K 3.9 3.9  CL 109 106  CO2 24  --  GLUCOSE 89 83  BUN 21* 23*  CREATININE 1.00 1.00  CALCIUM 8.4*  --    GFR: CrCl cannot be calculated (Unknown ideal weight.). Liver Function Tests: Recent Labs  Lab 04/19/17 1421  AST 75*  ALT 117*  ALKPHOS 103  BILITOT 0.3  PROT 5.8*  ALBUMIN 2.1*   No results for input(s): LIPASE, AMYLASE in the last 168 hours. Recent Labs  Lab 04/19/17 1421  AMMONIA 22   Coagulation Profile: No results for input(s): INR, PROTIME in the last 168 hours. Cardiac Enzymes: No results for input(s): CKTOTAL, CKMB, CKMBINDEX, TROPONINI in the last 168 hours. BNP (last 3 results) No results for input(s): PROBNP in the last 8760 hours. HbA1C: No results for input(s): HGBA1C in the last 72 hours. CBG: Recent Labs  Lab 04/19/17 1405  GLUCAP 89   Lipid Profile: No results for input(s): CHOL, HDL, LDLCALC, TRIG, CHOLHDL, LDLDIRECT in the last 72 hours. Thyroid Function Tests: No results for input(s): TSH, T4TOTAL, FREET4, T3FREE, THYROIDAB in the last 72 hours. Anemia Panel: No results for input(s): VITAMINB12, FOLATE, FERRITIN, TIBC,  IRON, RETICCTPCT in the last 72 hours. Urine analysis:    Component Value Date/Time   COLORURINE YELLOW 04/19/2017 1421   APPEARANCEUR HAZY (A) 04/19/2017 1421   LABSPEC 1.015 04/19/2017 1421   PHURINE 7.0 04/19/2017 1421   GLUCOSEU NEGATIVE 04/19/2017 1421   HGBUR NEGATIVE 04/19/2017 1421   BILIRUBINUR NEGATIVE 04/19/2017 1421   KETONESUR NEGATIVE 04/19/2017 1421   PROTEINUR NEGATIVE 04/19/2017 1421   NITRITE NEGATIVE 04/19/2017 1421   LEUKOCYTESUR SMALL (A) 04/19/2017 1421    Creatinine Clearance: CrCl cannot be calculated (Unknown ideal weight.).  Sepsis Labs: @LABRCNTIP (procalcitonin:4,lacticidven:4) )No results found for this or any previous visit (from the past 240 hour(s)).   Radiological Exams on Admission: Ct Head Wo Contrast  Result Date: 04/19/2017 CLINICAL DATA:  Altered mental status EXAM: CT HEAD WITHOUT CONTRAST TECHNIQUE: Contiguous axial images were obtained from the base of the skull through the vertex without intravenous contrast. COMPARISON:  Brain MRI 03/22/2017 FINDINGS: Brain: No mass lesion, intraparenchymal hemorrhage or extra-axial collection. No evidence of acute cortical infarct. There is periventricular hypoattenuation compatible with chronic microvascular disease. Old left basal ganglia lacunar infarct. Vascular: No hyperdense vessel or unexpected vascular calcification. Skull: Normal visualized skull base, calvarium and extracranial soft tissues. Sinuses/Orbits: No sinus fluid levels or advanced mucosal thickening. No mastoid effusion. Normal orbits. IMPRESSION: Chronic ischemic microangiopathy without acute intracranial abnormality. Old left gangliocapsular lacunar infarct. Electronically Signed   By: Ulyses Jarred M.D.   On: 04/19/2017 15:38   Dg Chest Port 1 View  Result Date: 04/19/2017 CLINICAL DATA:  Loss of consciousness.  Right-sided weakness. EXAM: PORTABLE CHEST 1 VIEW COMPARISON:  Chest x-ray 03/21/2017. FINDINGS: Cardiomegaly with normal  pulmonary vascularity. No focal infiltrate. No pleural effusion or pneumothorax. Degenerative change thoracic spine. IMPRESSION: 1.  Cardiomegaly with normal pulmonary vascularity. 2.  No acute pulmonary disease. Electronically Signed   By: Marcello Moores  Register   On: 04/19/2017 14:33    EKG: Sinus rhythm Probable left atrial enlargement Abnormal R-wave progression, early transition Left ventricular hypertrophy Prolonged QT interval  Assessment/Plan Principal Problem:   Acute encephalopathy Active Problems:   GERD (gastroesophageal reflux disease)   Stroke ([facility])   Protein-calorie malnutrition, severe   UTI (urinary tract infection)   Dysphagia   1. Acute encephalopathy. Etiology uncertain. Some question about worsening facial droop? Urinalysis is concerning for UTI. No other signs of infection. No metabolic derangement. Ammonia level within the  limits of normal. Lactic acid within normal limits of normal. CT of the head without acute abnormality. -Admit -follow MRI results -follow blood cultures -obtain a B-12 and folate -hold any altering medications -neuro checks  #2. Urinary tract infection. Urinalysis with small leukocytes WBCs too numerous to Rare bacteria and yeast. He was provided Rocephin in the emergency department -continue Rocephin -Follow urine culture  #3. Stroke/dysphasia. Chart review indicates last month patient suffered acute infarction in the posterior left basal ganglia. 2-D transthoracic echocardiography revealed an EF of 6065%, carotid Doppler 1-39% percent stenosis bilaterally, she failed modified barium swallow when PEG tube inserted February 28 -see #1 -nutritional consult for tube feeding -Free water per home regimen -Continue aspirin and statin  #4. chronic kidney disease stage III. -Stable at baseline  #5. Hypertension. Poor control in the emergency department. Home medications include Coreg -continue Coreg -When necessary hydralazine  #6. Recent left hip  fracture. Last hospitalization she underwent a left hip hemiarthroplasty. She received physical therapy and was discharged to staff. Home meds include Lovenox for DVT prophylaxis per orthopedics -Physical therapy -Continue Lovenox    DVT prophylaxis: lovenox  Code Status: full  Family Communication: none present Disposition Plan: back to facility  Consults called: none  Admission status: obs    Radene Gunning MD [facility]  If 7PM-7AM, please contact night-coverage [email or web address] Password [facility]  04/19/2017, 5:43 PM

## 2017-04-20 ENCOUNTER — Other Ambulatory Visit: Payer: Self-pay

## 2017-04-20 ENCOUNTER — Inpatient Hospital Stay (HOSPITAL_COMMUNITY): Payer: Medicare Other

## 2017-04-20 DIAGNOSIS — G8250 Quadriplegia, unspecified: Secondary | ICD-10-CM | POA: Diagnosis present

## 2017-04-20 DIAGNOSIS — I6302 Cerebral infarction due to thrombosis of basilar artery: Secondary | ICD-10-CM | POA: Diagnosis not present

## 2017-04-20 DIAGNOSIS — E785 Hyperlipidemia, unspecified: Secondary | ICD-10-CM | POA: Diagnosis present

## 2017-04-20 DIAGNOSIS — Z96642 Presence of left artificial hip joint: Secondary | ICD-10-CM | POA: Diagnosis present

## 2017-04-20 DIAGNOSIS — N183 Chronic kidney disease, stage 3 unspecified: Secondary | ICD-10-CM

## 2017-04-20 DIAGNOSIS — Z7982 Long term (current) use of aspirin: Secondary | ICD-10-CM | POA: Diagnosis not present

## 2017-04-20 DIAGNOSIS — R4701 Aphasia: Secondary | ICD-10-CM | POA: Diagnosis present

## 2017-04-20 DIAGNOSIS — G9340 Encephalopathy, unspecified: Secondary | ICD-10-CM | POA: Diagnosis not present

## 2017-04-20 DIAGNOSIS — Z931 Gastrostomy status: Secondary | ICD-10-CM | POA: Diagnosis not present

## 2017-04-20 DIAGNOSIS — K219 Gastro-esophageal reflux disease without esophagitis: Secondary | ICD-10-CM | POA: Diagnosis present

## 2017-04-20 DIAGNOSIS — I639 Cerebral infarction, unspecified: Secondary | ICD-10-CM | POA: Diagnosis present

## 2017-04-20 DIAGNOSIS — G9341 Metabolic encephalopathy: Secondary | ICD-10-CM | POA: Diagnosis present

## 2017-04-20 DIAGNOSIS — I129 Hypertensive chronic kidney disease with stage 1 through stage 4 chronic kidney disease, or unspecified chronic kidney disease: Secondary | ICD-10-CM | POA: Diagnosis present

## 2017-04-20 DIAGNOSIS — E43 Unspecified severe protein-calorie malnutrition: Secondary | ICD-10-CM | POA: Diagnosis present

## 2017-04-20 DIAGNOSIS — R29723 NIHSS score 23: Secondary | ICD-10-CM | POA: Diagnosis present

## 2017-04-20 DIAGNOSIS — R1310 Dysphagia, unspecified: Secondary | ICD-10-CM | POA: Diagnosis present

## 2017-04-20 DIAGNOSIS — Z6822 Body mass index (BMI) 22.0-22.9, adult: Secondary | ICD-10-CM | POA: Diagnosis not present

## 2017-04-20 DIAGNOSIS — N3000 Acute cystitis without hematuria: Secondary | ICD-10-CM | POA: Diagnosis not present

## 2017-04-20 DIAGNOSIS — I69351 Hemiplegia and hemiparesis following cerebral infarction affecting right dominant side: Secondary | ICD-10-CM | POA: Diagnosis not present

## 2017-04-20 DIAGNOSIS — Z79899 Other long term (current) drug therapy: Secondary | ICD-10-CM | POA: Diagnosis not present

## 2017-04-20 DIAGNOSIS — R4182 Altered mental status, unspecified: Secondary | ICD-10-CM | POA: Diagnosis present

## 2017-04-20 DIAGNOSIS — E1122 Type 2 diabetes mellitus with diabetic chronic kidney disease: Secondary | ICD-10-CM | POA: Diagnosis present

## 2017-04-20 DIAGNOSIS — N390 Urinary tract infection, site not specified: Secondary | ICD-10-CM | POA: Diagnosis present

## 2017-04-20 LAB — HEPATIC FUNCTION PANEL
ALBUMIN: 2 g/dL — AB (ref 3.5–5.0)
ALK PHOS: 99 U/L (ref 38–126)
ALT: 104 U/L — AB (ref 14–54)
AST: 61 U/L — ABNORMAL HIGH (ref 15–41)
Bilirubin, Direct: <0.1 mg/dL — ABNORMAL LOW (ref 0.1–0.5)
TOTAL PROTEIN: 5.8 g/dL — AB (ref 6.5–8.1)
Total Bilirubin: 0.4 mg/dL (ref 0.3–1.2)

## 2017-04-20 LAB — CBC
HCT: 25.3 % — ABNORMAL LOW (ref 36.0–46.0)
HEMOGLOBIN: 7.9 g/dL — AB (ref 12.0–15.0)
MCH: 27.4 pg (ref 26.0–34.0)
MCHC: 31.2 g/dL (ref 30.0–36.0)
MCV: 87.8 fL (ref 78.0–100.0)
PLATELETS: 351 10*3/uL (ref 150–400)
RBC: 2.88 MIL/uL — AB (ref 3.87–5.11)
RDW: 17.3 % — ABNORMAL HIGH (ref 11.5–15.5)
WBC: 5.4 10*3/uL (ref 4.0–10.5)

## 2017-04-20 LAB — GLUCOSE, CAPILLARY
GLUCOSE-CAPILLARY: 122 mg/dL — AB (ref 65–99)
GLUCOSE-CAPILLARY: 107 mg/dL — AB (ref 65–99)
Glucose-Capillary: 173 mg/dL — ABNORMAL HIGH (ref 65–99)

## 2017-04-20 LAB — BASIC METABOLIC PANEL
ANION GAP: 11 (ref 5–15)
BUN: 20 mg/dL (ref 6–20)
CHLORIDE: 106 mmol/L (ref 101–111)
CO2: 21 mmol/L — AB (ref 22–32)
Calcium: 8.5 mg/dL — ABNORMAL LOW (ref 8.9–10.3)
Creatinine, Ser: 0.96 mg/dL (ref 0.44–1.00)
GFR calc non Af Amer: 53 mL/min — ABNORMAL LOW (ref >60)
Glucose, Bld: 86 mg/dL (ref 65–99)
Potassium: 3.7 mmol/L (ref 3.5–5.1)
SODIUM: 138 mmol/L (ref 135–145)

## 2017-04-20 LAB — URINE CULTURE: Culture: >=100000 — AB

## 2017-04-20 LAB — FOLATE RBC
FOLATE, HEMOLYSATE: 339.7 ng/mL
FOLATE, RBC: 1231 ng/mL (ref >498)
Hematocrit: 27.6 % — ABNORMAL LOW (ref 34.0–46.6)

## 2017-04-20 LAB — MRSA PCR SCREENING: MRSA BY PCR: NEGATIVE

## 2017-04-20 MED ORDER — MUPIROCIN 2 % EX OINT
1.0000 "application " | TOPICAL_OINTMENT | Freq: Two times a day (BID) | CUTANEOUS | Status: DC
  Filled 2017-04-20: qty 22

## 2017-04-20 MED ORDER — ACETAMINOPHEN 325 MG PO TABS
650.0000 mg | ORAL_TABLET | Freq: Four times a day (QID) | ORAL | Status: DC | PRN
  Administered 2017-04-24: 650 mg
  Filled 2017-04-20: qty 2

## 2017-04-20 MED ORDER — JEVITY 1.2 CAL PO LIQD: 1000.0000 mL | ORAL | Status: DC

## 2017-04-20 MED ORDER — OSMOLITE 1.2 CAL PO LIQD
1000.0000 mL | ORAL | Status: DC
  Administered 2017-04-20 – 2017-04-23 (×2): 1000 mL
  Filled 2017-04-20 (×8): qty 1000

## 2017-04-20 MED ORDER — FLUCONAZOLE IN SODIUM CHLORIDE 200-0.9 MG/100ML-% IV SOLN
200.0000 mg | INTRAVENOUS | Status: DC
  Administered 2017-04-20: 200 mg via INTRAVENOUS
  Filled 2017-04-20 (×2): qty 100

## 2017-04-20 MED ORDER — CHLORHEXIDINE GLUCONATE CLOTH 2 % EX PADS: 6.0000 | MEDICATED_PAD | Freq: Every day | CUTANEOUS | Status: DC

## 2017-04-20 MED ORDER — ACETAMINOPHEN 650 MG RE SUPP: 650.0000 mg | Freq: Four times a day (QID) | RECTAL | Status: DC | PRN

## 2017-04-20 NOTE — Progress Notes (Signed)
CSW following for discharge plan. Patient is a recent readmission, see last assessment below completed on 03/22/17. Patient discharged to [facility] for SNF. CSW to meet with patient and family to determine plan to return to SNF when medically ready.  Laveda Abbe, [place] Clinical Social Worker [phone]      Clinical Social Work Assessment  Patient Details  Name: Alyssa Savage MRN: [id number] Date of Birth: 1930-08-14  Date of referral:  03/22/17               Reason for consult:  Facility Placement                 Permission sought to share information with:    Permission granted to share information::  No             Name::                   Agency::                Relationship::                Contact Information:     Housing/Transportation Living arrangements for the past 2 months:  Single Family Home(per pt alone. ) Source of Information:  Patient Patient Interpreter Needed:  None Criminal Activity/Legal Involvement Pertinent to Current Situation/Hospitalization:  No - Comment as needed Significant Relationships:  Adult Children, Other Family Members Lives with:  Self Do you feel safe going back to the place where you live?  Yes Need for family participation in patient care:  Yes (Comment)  Care giving concerns:  CSW spoke with pt at bedside. At this time pt expressed no concerns to CSW.    Social Worker assessment / plan:  CSW spoke with pt at bedside. During this time CSW was informed that pt is from home alone. Pt reports that pt has support from other family members (a cousin) that check on pt at times. Pt expressed that pt has a daughter that lives in [place] (not sure) and that she may be coming to see pt once back home. Pt is agreeable to SNF placement at the time of discharge if needed.   Employment status:  Retired Forensic scientist:  Self Pay (Medicaid Pending) PT Recommendations:  Not assessed at this time Information /  Referral to community resources:  [place]  Patient/Family's Response to care:  Pt appeared to be understanding and agreeable to plan of care at this time.   Patient/Family's Understanding of and Emotional Response to Diagnosis, Current Treatment, and Prognosis:  No further questions or concerns have been presented to CSW at this time.  Emotional Assessment Appearance:  Appears stated age Attitude/Demeanor/Rapport:  Engaged Affect (typically observed):    Orientation:  Oriented to Self Alcohol / Substance use:  Not Applicable Psych involvement (Current and /or in the community):  No (Comment)(not at this time.)  Discharge Needs  Concerns to be addressed:  Denies Needs/Concerns at this time Readmission within the last 30 days:  No Current discharge risk:  Dependent with Mobility Barriers to Discharge:  Continued Medical Work up   [employer], [place] 03/22/2017, 10:24 AM

## 2017-04-20 NOTE — Progress Notes (Signed)
PROGRESS NOTE  Alyssa Savage ACZ:[id number] DOB: Aug 25, 1930 DOA: 04/19/2017 PCP: Patient, No Pcp Per  Brief Narrative: 84 year old woman PMH recent stroke with right-sided hemiparesis, severe dysphagia, status post PEG, left hip fracture, presented with acute encephalopathy from skilled nursing facility, previously verbal but over the last 2 days prior to presentation "stop talking".  CT head without acute changes.  Admitted for acute encephalopathy of unclear etiology, UTI.  Assessment/Plan Acute encephalopathy, etiology unclear I suspect UTI.  Alert and following commands.  Doubt new stroke.  CT head unremarkable.  Lactic acid within normal limits. --Continue antibiotics.  If fails to improve would consider further imaging but doubt stroke at this time.  UTI --Continue empiric antibiotics.  Add Diflucan.  PMH left MCA stroke with right-sided weakness.  Appears to be stable.  Severe dysphagia, status post PEG tube.  Chronic kidney disease stage III --Stable.  Recent left hip fracture, status post left hip hemiarthroplasty --Continue Lovenox for DVT prophylaxis.  Physical therapy.  Severe malnutrition --Per dietitian  DVT prophylaxis: enoxaparin Code Status: full Family Communication: cousin at bedside Disposition Plan: return to SNF    Murray Hodgkins, MD  [facility] Direct contact: [phone] --Via amion app OR  --[email or web address]; password [email or web address]  7PM-7AM contact night coverage as above 04/20/2017, 4:06 PM  LOS: 0 days   Consultants:    Procedures:    Antimicrobials:  Ceftriaxone 3/19 >>  Interval history/Subjective: Family at bedside.  Patient not speaking.  They report that she will had dysarthria or difficulty speaking after her stroke but was able to slowly verbalize.  Patient is awake and alert and answers questions with head nod and does follow simple commands.  Objective: Vitals:  Vitals:   04/20/17 1100 04/20/17 1500  BP: (!) 149/80 (!)  156/88  Pulse: 96 98  Resp: 17 17  Temp: 99 F (37.2 C) 98.9 F (37.2 C)  SpO2: 98% 97%    Exam:  Constitutional:  . Appears calm and comfortable Eyes:  . pupils and irises appear normal . Normal lids  ENMT:  . grossly normal hearing  Respiratory:  . CTA bilaterally, no w/r/r.  . Respiratory effort normal Cardiovascular:  . RRR, no m/r/g . No LE extremity edema   Abdomen:  . Soft, nontender Musculoskeletal:  . Right upper and lower extremity weakness noted Moves left upper and left lower extremities to command. Neurologic:  . Right facial weakness noted Psychiatric:  . Mental status o Mood, affect difficult to assess but appears appropriate . Orientation cannot be assessed   I have personally reviewed the following:   Labs:  Blood sugars stable  Basic metabolic panel unremarkable  Elevated transaminases somewhat improved today.  Hemoglobin 7.9  Imaging studies:  Chest x-ray, CT head no acute disease  Medical tests:  EKG sinus rhythm, LVH  Scheduled Meds: . aspirin  325 mg Per Tube Daily  . atorvastatin  40 mg Per Tube q1800  . carvedilol  3.125 mg Per Tube BID WC  . Chlorhexidine Gluconate Cloth  6 each Topical Q0600  . clotrimazole  1 Applicatorful Vaginal QHS  . enoxaparin  30 mg Subcutaneous Q24H  . mupirocin ointment  1 application Nasal BID   Continuous Infusions: . sodium chloride 50 mL/hr at 04/19/17 1846  . cefTRIAXone (ROCEPHIN)  IV    . feeding supplement (OSMOLITE 1.2 CAL) 1,000 mL (04/20/17 1307)  . fluconazole (DIFLUCAN) IV    . magnesium sulfate 1 - 4 g bolus IVPB      Principal Problem:  Acute encephalopathy Active Problems:   GERD (gastroesophageal reflux disease)   Stroke (HCC)   Protein-calorie malnutrition, severe   UTI (urinary tract infection)   Dysphagia   CKD (chronic kidney disease), stage III ([place])   LOS: 0 days

## 2017-04-20 NOTE — Progress Notes (Signed)
Initial Nutrition Assessment  DOCUMENTATION CODES:   Severe malnutrition in context of chronic illness  INTERVENTION:    Initiate Osmolite 1.2 formula at 20 ml/hr and increase by 10 ml every 4 hours to goal rate of 50 ml/hr  Provides 1440 kcals, 67 gm protein, 985 ml of free water  NUTRITION DIAGNOSIS:   Severe Malnutrition related to chronic illness(CVA with residual deficits) as evidenced by severe muscle depletion and severe fat depletion   GOAL:   Patient will meet greater than or equal to 90% of their needs  MONITOR:   TF tolerance, Labs, Skin, Weight trends, I & O's  REASON FOR ASSESSMENT:   Consult Enteral/tube feeding initiation and management  ASSESSMENT:   84 y.o. Female with PMH significant recent stroke, severe dysphagia PEG tube, HTN, femoral neck fracture presents to ED with chief complaint of altered mental status.   2/26  MBSS, SLP rec long term means of nutrition 2/28  PEG tube placed  Pt admitted from [facility]. She is familiar to Clinical Nutrition with recent hospitalization. Receives bolus TF regimen of Osmolite 1.2: 1 can QID with PS daily.  Pt's weight has increased since recent discharge. Free water flushes at 200 ml 4 times daily. Labs and medications reviewed. CBG 89.  NUTRITION - FOCUSED PHYSICAL EXAM:    Most Recent Value  Orbital Region  Severe depletion  Upper Arm Region  Severe depletion  Thoracic and Lumbar Region  Severe depletion  Buccal Region  Severe depletion  Temple Region  Severe depletion  Clavicle Bone Region  Severe depletion  Clavicle and Acromion Bone Region  Severe depletion  Scapular Bone Region  Severe depletion  Dorsal Hand  Severe depletion  Patellar Region  Severe depletion  Anterior Thigh Region  Severe depletion  Posterior Calf Region  Severe depletion  Edema (RD Assessment)  None     Diet Order:  Diet NPO time specified  EDUCATION NEEDS:   Not appropriate for education at this  time  Skin:  Skin Assessment: Reviewed RN Assessment  Last BM:  PTA   Intake/Output Summary (Last 24 hours) at 04/20/2017 1141 Last data filed at 04/20/2017 0300 Gross per 24 hour  Intake 500 ml  Output 15 ml  Net 485 ml   Height:   Ht Readings from Last 1 Encounters:  04/20/17 5' (1.524 m)   Weight:   Wt Readings from Last 1 Encounters:  04/20/17 119 lb 7.8 oz (54.2 kg)   03/01 102 lbs  02/25 108 lbs  Ideal Body Weight:  45.4 kg  BMI:  Body mass index is 23.34 kg/m.  Estimated Nutritional Needs:   Kcal:  1200-1400  Protein:  60-75 gm  Fluid:  >/= 1.5 L  Arthur Holms, RD, LDN Pager #: [phone] After-Hours Pager #: [phone]

## 2017-04-20 NOTE — Plan of Care (Signed)
  Progressing Education: Knowledge of General Education information will improve 04/20/2017 1216 - Progressing by Harlin Heys, RN Health Behavior/Discharge Planning: Ability to manage health-related needs will improve 04/20/2017 1216 - Progressing by Harlin Heys, RN Activity: Risk for activity intolerance will decrease 04/20/2017 1216 - Progressing by Harlin Heys, RN Nutrition: Adequate nutrition will be maintained 04/20/2017 1216 - Progressing by Harlin Heys, RN Coping: Level of anxiety will decrease 04/20/2017 1216 - Progressing by Harlin Heys, RN Elimination: Will not experience complications related to bowel motility 04/20/2017 1216 - Progressing by Harlin Heys, RN Safety: Ability to remain free from injury will improve 04/20/2017 1216 - Progressing by Harlin Heys, RN Skin Integrity: Risk for impaired skin integrity will decrease 04/20/2017 1216 - Progressing by Harlin Heys, RN

## 2017-04-21 DIAGNOSIS — R1310 Dysphagia, unspecified: Secondary | ICD-10-CM

## 2017-04-21 DIAGNOSIS — I6302 Cerebral infarction due to thrombosis of basilar artery: Secondary | ICD-10-CM

## 2017-04-21 LAB — GLUCOSE, CAPILLARY
GLUCOSE-CAPILLARY: 165 mg/dL — AB (ref 65–99)
GLUCOSE-CAPILLARY: 161 mg/dL — AB (ref 65–99)
GLUCOSE-CAPILLARY: 119 mg/dL — AB (ref 65–99)
Glucose-Capillary: 147 mg/dL — ABNORMAL HIGH (ref 65–99)
Glucose-Capillary: 179 mg/dL — ABNORMAL HIGH (ref 65–99)
Glucose-Capillary: 196 mg/dL — ABNORMAL HIGH (ref 65–99)

## 2017-04-21 MED ORDER — CLOPIDOGREL BISULFATE 75 MG PO TABS
75.0000 mg | ORAL_TABLET | Freq: Every day | ORAL | Status: DC
  Administered 2017-04-22 – 2017-04-25 (×4): 75 mg
  Filled 2017-04-21 (×4): qty 1

## 2017-04-21 MED ORDER — FLUCONAZOLE 100 MG PO TABS
200.0000 mg | ORAL_TABLET | ORAL | Status: DC
  Administered 2017-04-21 – 2017-04-24 (×4): 200 mg
  Filled 2017-04-21 (×4): qty 2

## 2017-04-21 NOTE — Progress Notes (Signed)
PROGRESS NOTE  Alyssa Savage YQI:[id number] DOB: 1930-09-29 DOA: 04/19/2017 PCP: Patient, No Pcp Per  Brief Narrative: 84 year old woman PMH recent stroke with right-sided hemiparesis, severe dysphagia, status post PEG, left hip fracture, presented with acute encephalopathy from skilled nursing facility, previously verbal but over the last 2 days prior to presentation "stop talking".  CT head without acute changes.  Admitted for acute encephalopathy of unclear etiology, UTI.  Assessment/Plan Small acute infarct right paramedian pons --Continue aspirin, statin for now.  Discussed with Dr. Rory Percy.  Neurology will evaluate later today for further recommendations. It is not clear that the stroke is playing a role in patient's acute encephalopathy.  Acute encephalopathy, suspect candiduria/UTI as driving force, also consider stroke. --No significant change today.  Remains nonverbal but does follow commands. Continue fluconazole.  UTI --Continue fluconazole.  PMH left MCA stroke with right-sided weakness.    Severe dysphagia, status post PEG tube. --Continue PEG tube feeds.  Remain n.p.o.  Chronic kidney disease stage III --Creatinine at baseline.  Elevated transaminases, trending down, possibly related to tube feeds.  Follow-up as an outpatient.  Recent left hip fracture, status post left hip hemiarthroplasty --Continue Lovenox for DVT prophylaxis.  Physical therapy.  Severe malnutrition --Per dietitian  DVT prophylaxis: enoxaparin Code Status: full Family Communication: cousin at bedside Disposition Plan: return to SNF    Murray Hodgkins, MD  [facility] Direct contact: [phone] --Via amion app OR  --[email or web address]; password [email or web address]  7PM-7AM contact night coverage as above 04/21/2017, 10:45 AM  LOS: 1 day   Consultants:    Procedures:    Antimicrobials:  Ceftriaxone 3/19   Fluconazole 3/20 >>   Interval history/Subjective: Nonverbal, but alert and  follows commands.  Objective: Vitals:  Vitals:   04/21/17 0414 04/21/17 0838  BP: (!) 139/92 130/69  Pulse: 86 89  Resp: 18 20  Temp: 98.7 F (37.1 C) 99.5 F (37.5 C)  SpO2: 97% 97%    Exam: Constitutional:   . Appears calm and comfortable Eyes:  . pupils and irises appear normal ENMT:  . grossly normal hearing  Respiratory:  . CTA bilaterally, no w/r/r.  . Respiratory effort normal Cardiovascular:  . RRR, no m/r/g . No LE extremity edema   Abdomen:  . Soft, nontender, nondistended.  PEG tube noted. Musculoskeletal:  . Right upper and right lower extremity weak.  Moves left upper and left lower extremity to command. Neurologic:  . Right facial weakness noted.  No apparent changes noted. Psychiatric:  . Mental status . Difficult to assess.  She is awake and does follow simple motor commands.  She is nonverbal.    I have personally reviewed the following:   Labs:  Blood sugar stable.  Basic metabolic panel unremarkable.  AST, ALT trending down.  61, 104 respectively.  Hemoglobin 7.9, stable.  Imaging studies:  MRI brain showed small acute infarct right paramedian pons.  Subacute infarct left basal ganglia unchanged in size.  Numerous scattered chronic microhemorrhages throughout the brain.  Could indicate chronic hypertensive microangiopathy, amyloid angiopathy or combination.  Medical tests:    Scheduled Meds: . aspirin  325 mg Per Tube Daily  . atorvastatin  40 mg Per Tube q1800  . carvedilol  3.125 mg Per Tube BID WC  . Chlorhexidine Gluconate Cloth  6 each Topical Q0600  . clotrimazole  1 Applicatorful Vaginal QHS  . enoxaparin  30 mg Subcutaneous Q24H  . mupirocin ointment  1 application Nasal BID   Continuous Infusions: . feeding supplement (OSMOLITE 1.2  CAL) 1,000 mL (04/20/17 1307)  . fluconazole (DIFLUCAN) IV Stopped (04/20/17 1800)  . magnesium sulfate 1 - 4 g bolus IVPB      Principal Problem:   Acute encephalopathy Active  Problems:   Stroke (HCC)   Protein-calorie malnutrition, severe   UTI (urinary tract infection)   Dysphagia   CKD (chronic kidney disease), stage III ([place])   LOS: 1 day

## 2017-04-21 NOTE — Consult Note (Addendum)
Neurology Consultation  Reason for Consult: Stroke evaluation  Referring Physician: Dr. Maylene Roes  CC: Weakness, encephalopathy   History is obtained from patient's family and chart review.   HPI: Alyssa Savage is a 84 y.o. female with a pmhx of recent stroke with right hemiparesis, severe dysphagia with PEG tube, left hip fracture who presented from her skilled nursing facility with worsening deficits and altered mental status. On admission she was noted to be non verbal with new left sided weakness. CT head was negative for acute changes but MRI brain noted a new acute infarct of the right paramedian pons. Her previous left basal ganglia infarct was unchanged in size. Work up was also notable for a UTI.   LKW: unknown - at best Friday 04/15/17 tpa given?: no- outside window Premorbid modified Rankin scale (mRS): 5 ROS: Unable to obtain due to altered mental status.   Past Medical History:  Diagnosis Date  . Femoral neck fracture ([place]) 03/2017   left   . GERD (gastroesophageal reflux disease)   . Hypertensive emergency 03/2017  . Hypokalemia 03/2017  . Stroke [facility])    Family History  Problem Relation Age of Onset  . CAD Mother   . Heart disease Mother     Social History:   reports that she has never smoked. She has never used smokeless tobacco. She reports that she does not drink alcohol or use drugs.  Medications  Current Facility-Administered Medications:  .  acetaminophen (TYLENOL) tablet 650 mg, 650 mg, Per Tube, Q6H PRN **OR** acetaminophen (TYLENOL) suppository 650 mg, 650 mg, Rectal, Q6H PRN, Samuella Cota, MD .  aspirin tablet 325 mg, 325 mg, Per Tube, Daily, Black, Lezlie Octave, NP, 325 mg at 04/21/17 0951 .  atorvastatin (LIPITOR) tablet 40 mg, 40 mg, Per Tube, q1800, Radene Gunning, NP, 40 mg at 04/20/17 1700 .  carvedilol (COREG) tablet 3.125 mg, 3.125 mg, Per Tube, BID WC, Black, Lezlie Octave, NP, 3.125 mg at 04/21/17 0909 .  Chlorhexidine Gluconate Cloth 2 % PADS 6  each, 6 each, Topical, Q0600, Dessa Phi, DO .  clotrimazole (GYNE-LOTRIMIN) vaginal cream 1 Applicatorful, 1 Applicatorful, Vaginal, QHS, Dessa Phi, DO, 1 Applicatorful at 67/89/38 2155 .  enoxaparin (LOVENOX) injection 30 mg, 30 mg, Subcutaneous, Q24H, Black, Karen M, NP, 30 mg at 04/21/17 0951 .  feeding supplement (OSMOLITE 1.2 CAL) liquid 1,000 mL, 1,000 mL, Per Tube, Continuous, Samuella Cota, MD, Last Rate: 50 mL/hr at 04/20/17 1307, 1,000 mL at 04/20/17 1307 .  fluconazole (DIFLUCAN) IVPB 200 mg, 200 mg, Intravenous, Q24H, Samuella Cota, MD, Stopped at 04/20/17 1800 .  hydrALAZINE (APRESOLINE) injection 5 mg, 5 mg, Intravenous, Q4H PRN, Black, Karen M, NP, 5 mg at 04/20/17 2041 .  HYDROcodone-acetaminophen (NORCO/VICODIN) 5-325 MG per tablet 1-2 tablet, 1-2 tablet, Per Tube, Q6H PRN, Black, Karen M, NP .  magnesium sulfate IVPB 2 g 50 mL, 2 g, Intravenous, Once, Black, Karen M, NP .  mupirocin ointment (BACTROBAN) 2 % 1 application, 1 application, Nasal, BID, Dessa Phi, DO .  polyvinyl alcohol (LIQUIFILM TEARS) 1.4 % ophthalmic solution 2 drop, 2 drop, Both Eyes, PRN, Dessa Phi, DO .  senna-docusate (Senokot-S) tablet 1 tablet, 1 tablet, Per Tube, QHS PRN, Black, Karen M, NP .  traZODone (DESYREL) tablet 25 mg, 25 mg, Oral, QHS PRN, Black, Karen M, NP, 25 mg at 04/19/17 2152   Exam: Current vital signs: BP 130/69 (BP Location: Right Arm)   Pulse 89   Temp 99.5 F (  37.5 C) (Oral) Comment: RN Notified  Resp 20   Ht 5' (1.524 m) Comment: approximately 5' per family  Wt 119 lb 4.3 oz (54.1 kg)   SpO2 97%   BMI 23.29 kg/m  Vital signs in last 24 hours: Temp:  [98.7 F (37.1 C)-99.5 F (37.5 C)] 99.5 F (37.5 C) (03/21 0838) Pulse Rate:  [86-98] 89 (03/21 0838) Resp:  [17-20] 20 (03/21 0838) BP: (130-184)/(69-92) 130/69 (03/21 0838) SpO2:  [97 %-99 %] 97 % (03/21 0838) Weight:  [119 lb 4.3 oz (54.1 kg)] 119 lb 4.3 oz (54.1 kg) (03/21 0414)  GENERAL:  Awake, alert in NAD HEENT: - Head & neck deviated to the left. Normocephalic and atraumatic, dry mm, no LN++, no Thyromegally LUNGS - Breathing comfortably on RA ABDOMEN - Soft, nontender, non-distended, PEG tube Ext: warm, well perfused, intact peripheral pulses, no edema  NEURO:  Mental Status: Alert but non verbal, nods yes and no, follows simple commands. Language: Non verbal, comprehension intact Cranial Nerves: PERRL. EOMI, visual fields full, facial sensation intact, hearing intact, tongue deviated and protruding to the left.  Motor: Complete right hemiparesis, able to move fingers and toes only on the left. Tone: is normal and bulk is normal Sensation- Intact to light touch bilaterally Coordination: Unable to assess Reflexes: 2+ throughout  Gait- defeerred [id number]   Labs I have reviewed labs in epic and the results pertinent to this consultation are: CBC    Component Value Date/Time   WBC 5.4 04/20/2017 0501   RBC 2.88 (L) 04/20/2017 0501   HGB 7.9 (L) 04/20/2017 0501   HCT 25.3 (L) 04/20/2017 0501   HCT 27.6 (L) 04/19/2017 1836   PLT 351 04/20/2017 0501   MCV 87.8 04/20/2017 0501   MCH 27.4 04/20/2017 0501   MCHC 31.2 04/20/2017 0501   RDW 17.3 (H) 04/20/2017 0501   LYMPHSABS 1.2 04/19/2017 1421   MONOABS 0.4 04/19/2017 1421   EOSABS 0.2 04/19/2017 1421   BASOSABS 0.0 04/19/2017 1421    CMP     Component Value Date/Time   NA 138 04/20/2017 0501   K 3.7 04/20/2017 0501   CL 106 04/20/2017 0501   CO2 21 (L) 04/20/2017 0501   GLUCOSE 86 04/20/2017 0501   BUN 20 04/20/2017 0501   CREATININE 0.96 04/20/2017 0501   CALCIUM 8.5 (L) 04/20/2017 0501   PROT 5.8 (L) 04/20/2017 0501   ALBUMIN 2.0 (L) 04/20/2017 0501   AST 61 (H) 04/20/2017 0501   ALT 104 (H) 04/20/2017 0501   ALKPHOS 99 04/20/2017 0501   BILITOT 0.4 04/20/2017 0501   GFRNONAA 53 (L) 04/20/2017 0501   GFRAA >60 04/20/2017 0501    Lipid Panel     Component Value Date/Time   CHOL 203 (H)  03/22/2017 0344   TRIG 90 03/22/2017 0344   HDL 68 03/22/2017 0344   CHOLHDL 3.0 03/22/2017 0344   VLDL 18 03/22/2017 0344   LDLCALC 117 (H) 03/22/2017 0344   Imaging I have reviewed the images obtained:  CT-scan of the brain: IMPRESSION: Chronic ischemic microangiopathy without acute intracranial abnormality. Old left gangliocapsular lacunar infarct.  MRI examination of the brain: IMPRESSION: 1. Small acute infarct of the right paramedian pons. 2. Subacute infarct of the left basal ganglia, unchanged in size. 3. Numerous scattered chronic microhemorrhages throughout the brain, unchanged. This could indicate chronic hypertensive microangiopathy, amyloid angiopathy or a combination of the two.  Assessment: 84 yo F recently admitted with left basal ganglia CVA and right hemiparesis presenting from  SNF with aphasia, right tongue paralysis, and worsening motor deficits now involving her left side, rendering her essentially quadriplegic.  Acute right paramedian pontine stroke seen on MRI - likely small vessel etiology.  Impression: Acute CVA of the right paramedial pons Subacute left basal ganglia CVA   Recommendations: -- Will change ASA to Plavix  -- No further imaging for now given recent admission and stroke work up -- Continue Lipitor 40 -- Tele -- PT / OT -- Goals of care discussion  -- Stroke team to follow in AM   Velna Ochs, M.D. - PGY2 Pager: [phone] 04/21/2017, 9:59 AM    Attending Neurohospitalist Addendum Patient seen and examined with APP/Resident. Agree with the history and physical as documented above. Agree with the plan as documented, which I helped formulate. I have made the edits in the note above. I have independently reviewed the chart, obtained history, review of systems and examined the patient.I have personally reviewed pertinent head/neck/spine imaging (CT/MRI). MRI shows the new paramedian pontine stroke on right. Also shows evolution of  prior left BG stroke. Please feel free to call with any questions. --- Amie Portland, MD [facility] Pager: [phone]  If 7pm to 7am, please call on call as listed on AMION.

## 2017-04-22 DIAGNOSIS — I639 Cerebral infarction, unspecified: Secondary | ICD-10-CM

## 2017-04-22 LAB — CBC
HEMATOCRIT: 27.3 % — AB (ref 36.0–46.0)
Hemoglobin: 8.6 g/dL — ABNORMAL LOW (ref 12.0–15.0)
MCH: 27.7 pg (ref 26.0–34.0)
MCHC: 31.5 g/dL (ref 30.0–36.0)
MCV: 87.8 fL (ref 78.0–100.0)
PLATELETS: 388 10*3/uL (ref 150–400)
RBC: 3.11 MIL/uL — ABNORMAL LOW (ref 3.87–5.11)
RDW: 17.6 % — AB (ref 11.5–15.5)
WBC: 7.3 10*3/uL (ref 4.0–10.5)

## 2017-04-22 LAB — GLUCOSE, CAPILLARY
GLUCOSE-CAPILLARY: 172 mg/dL — AB (ref 65–99)
Glucose-Capillary: 147 mg/dL — ABNORMAL HIGH (ref 65–99)
Glucose-Capillary: 146 mg/dL — ABNORMAL HIGH (ref 65–99)
Glucose-Capillary: 135 mg/dL — ABNORMAL HIGH (ref 65–99)

## 2017-04-22 MED ORDER — STROKE: EARLY STAGES OF RECOVERY BOOK
Freq: Once | Status: AC
  Administered 2017-04-22: 19:00:00

## 2017-04-22 NOTE — Progress Notes (Signed)
  PROGRESS NOTE  Alyssa Savage YIF:[id number] DOB: 11-Mar-1930 DOA: 04/19/2017 PCP: Patient, No Pcp Per  Brief Narrative: 84 year old woman PMH recent stroke with right-sided hemiparesis, severe dysphagia, status post PEG, left hip fracture, presented with acute encephalopathy from skilled nursing facility, previously verbal but over the last 2 days prior to presentation "stop talking".  CT head without acute changes.  Admitted for acute encephalopathy of unclear etiology, UTI.  Assessment/Plan Small acute infarct right paramedian pons --Neurology changed aspirin to Plavix.  Continue Lipitor.  Further recommendations per stroke team.  Acute encephalopathy --Appears probably improved.  Remains nonverbal but does follow simple commands and is very alert.  I suspect she is very close to new baseline.  I begin to doubt that infection is playing any role in her a aphasia.  UTI --Continue fluconazole.  PMH left MCA stroke with right-sided weakness.    Severe dysphagia, status post PEG tube. --Continue PEG tube feeds.  Remain n.p.o.  Chronic kidney disease stage III --Stable.  Elevated transaminases, trending down, possibly related to tube feeds.  Follow-up as an outpatient.  Recent left hip fracture, status post left hip hemiarthroplasty --Continue Lovenox for DVT prophylaxis.  Continue physical therapy therapy.  Severe malnutrition --Per dietitian  DVT prophylaxis: enoxaparin Code Status: full Family Communication:  Disposition Plan: return to SNF nightly next 48 hours if remains stable   Murray Hodgkins, MD  [facility] Direct contact: [phone] --[place] [place]  --[email or web address]; password [email or web address]  7PM-7AM contact night coverage as above 04/22/2017, 2:10 PM  LOS: 2 days   Consultants:    Procedures:    Antimicrobials:  Ceftriaxone 3/19   Fluconazole 3/20 >>   Interval history/Subjective: Remains nonverbal but alert and follows  commands.  Objective: Vitals:  Vitals:   04/22/17 0809 04/22/17 1152  BP: 138/81 (!) 160/85  Pulse: 96 93  Resp: 16 16  Temp: 98.3 F (36.8 C) 98.8 F (37.1 C)  SpO2: 97% 97%    Exam: Constitutional:   . Appears calm and comfortable Eyes:  . pupils and irises appear normal ENMT:  . grossly normal hearing  Respiratory:  . CTA bilaterally, no w/r/r.  . Respiratory effort normal Cardiovascular:  . RRR, no m/r/g . No LE extremity edema   Abdomen:  . Soft, nontender, nondistended Musculoskeletal:  . Right upper and lower extremity weak, flaccid. . Moves left upper and left lower extremity to command Neurologic:  . No apparent change.  Remains nonverbal but does track with eyes, make head gestures and follow simple commands. Psychiatric:  . Mental status o Mood, affect difficult to assess    I have personally reviewed the following:   Labs:  Blood sugars stable.  Hemoglobin stable, 8.6.  Scheduled Meds: . atorvastatin  40 mg Per Tube q1800  . carvedilol  3.125 mg Per Tube BID WC  . clopidogrel  75 mg Per Tube Daily  . clotrimazole  1 Applicatorful Vaginal QHS  . enoxaparin  30 mg Subcutaneous Q24H  . fluconazole  200 mg Per Tube Q24H   Continuous Infusions: . feeding supplement (OSMOLITE 1.2 CAL) 1,000 mL (04/20/17 1307)  . magnesium sulfate 1 - 4 g bolus IVPB      Principal Problem:   Acute encephalopathy Active Problems:   Stroke (HCC)   Protein-calorie malnutrition, severe   UTI (urinary tract infection)   Dysphagia   CKD (chronic kidney disease), stage III ([place])   LOS: 2 days

## 2017-04-22 NOTE — Progress Notes (Signed)
Patients BP at 2012 was 189/93 gave her 5 mg Hydralazine at 2115 it was 135//82, will continue to monitor.

## 2017-04-22 NOTE — Progress Notes (Signed)
STROKE TEAM PROGRESS NOTE   SUBJECTIVE (INTERVAL HISTORY) Her family is not at bedside. Nods when asked if she is doing well.    OBJECTIVE Vitals:   04/21/17 2115 04/21/17 2356 04/22/17 0500 04/22/17 0809  BP: 135/82 (!) 153/86 132/76 138/81  Pulse:  (!) 106 91 96  Resp:  20 20 16   Temp:  97.8 F (36.6 C) 98 F (36.7 C) 98.3 F (36.8 C)  TempSrc:  Oral Oral Oral  SpO2:  97% 98% 97%  Weight:   52.9 kg (116 lb 10 oz)   Height:        CBC:  Recent Labs  Lab 04/19/17 1421  04/20/17 0501 04/22/17 0303  WBC 4.8  --  5.4 7.3  NEUTROABS 3.0  --   --   --   HGB 11.2*   < > 7.9* 8.6*  HCT 35.6*   < > 25.3* 27.3*  MCV 88.3  --  87.8 87.8  PLT 272  --  351 388   < > = values in this interval not displayed.    Basic Metabolic Panel:  Recent Labs  Lab 04/19/17 1421 04/19/17 1516 04/20/17 0501  NA 139 142 138  K 3.9 3.9 3.7  CL 109 106 106  CO2 24  --  21*  GLUCOSE 89 83 86  BUN 21* 23* 20  CREATININE 1.00 1.00 0.96  CALCIUM 8.4*  --  8.5*    Lipid Panel:     Component Value Date/Time   CHOL 203 (H) 03/22/2017 0344   TRIG 90 03/22/2017 0344   HDL 68 03/22/2017 0344   CHOLHDL 3.0 03/22/2017 0344   VLDL 18 03/22/2017 0344   LDLCALC 117 (H) 03/22/2017 0344   HgbA1c:  Lab Results  Component Value Date   HGBA1C 6.3 (H) 03/22/2017   Urine Drug Screen:     Component Value Date/Time   LABOPIA NONE DETECTED 03/22/2017 1528   COCAINSCRNUR NONE DETECTED 03/22/2017 1528   LABBENZ NONE DETECTED 03/22/2017 1528   AMPHETMU NONE DETECTED 03/22/2017 1528   THCU NONE DETECTED 03/22/2017 1528   LABBARB NONE DETECTED 03/22/2017 1528    Alcohol Level     Component Value Date/Time   ETH <10 04/19/2017 1421   MRI brain 04/02/2017:  1. Small acute infarct of the right paramedian pons. 2. Subacute infarct of the left basal ganglia, unchanged in size. 3. Numerous scattered chronic microhemorrhages throughout the brain,unchanged. This could indicate chronic hypertensive  microangiopathy,amyloid angiopathy or a combination of the two.   Ct Head Wo Contrast Result Date: 03/21/2017 IMPRESSION: 1. Acute nonhemorrhagic RIGHT basal ganglia infarct. 2. Old bilateral basal ganglia and thalamus lacunar infarcts. Moderate to severe chronic small vessel ischemic disease. 3. Acute findings discussed with and reconfirmed by Dr.JON KNAPP on 03/21/2017 at 8:44 pm. Electronically Signed   By: Elon Alas M.D.   On: 03/21/2017 20:45   Mr Brain Lottie Dawson Contrast Mr Jodene Nam Head Wo Contrast Result Date: 03/22/2017  IMPRESSION: 1.5-2 cm acute infarction in the posterior left basal ganglia, posterior limb internal capsule and radiating white matter tracts. No mass effect or hemorrhage. Widespread chronic small-vessel ischemic changes elsewhere throughout the brain as outlined above. No large vessel occlusion or correctable proximal stenosis. Widespread intracranial medium to small vessel atherosclerotic narrowing and irregularity. Electronically Signed   By: Nelson Chimes M.D.   On: 03/22/2017 11:49   Echocardiogram:  Left ventricle: The cavity size was normal. There was moderate concentric hypertrophy. Systolic function was normal. The estimated ejection fraction was in the range of 60% to 65%. Wall motion was normal; there were no regional wall motion abnormalities  B/L Carotid U/S 03/24/2017:  1-39% carotid stenosis    PHYSICAL EXAM  PHYSICAL EXAM Physical exam: Exam: Gen: NAD Eyes: anicteric sclerae, moist conjunctivae                    CV: no MRG, no carotid bruits, no peripheral edema Mental Status: Alert, follows simple commands and nods  Neuro: Detailed Neurologic Exam  Speech:    Non verbal  Cranial Nerves:    The pupils are equal, round, and reactive to light.. Attempted, Fundi not visualized.  EOMI. Midline conjugate gaze. Tongue deviation to the left. Hearing intact to voice. Lower facial weakness.   Motor Observation:    no  involuntary movements noted.     Strength:    Dense left hemiparesis but can squeeze fingers     Sensation:  Intact to LT  ASSESSMENT/PLAN This is an 84 year old female who was recently admitted to [facility] with an acute infarction in the posterior left basal ganglia, posterior limb internal capsule and radiating white matter tracts which was likely due to small vessel disease with right-sided deficits.  Past medical history diabetes, hyperlipidemia and hypertension, previous strokes.  Patient presents for admission again for acute stroke of the right paramedian pons due to small vessel disease.  She also has chronic microhemorrhages throughout the brain.   Continue Plavix/Statin (ASA changed to Plavix) Frequent neuro checks Telemetry monitoring PT/OT/SLP Consult PM & Rehab Consult Case Management /MSW Ongoing aggressive stroke risk factor management Patient counseled to be compliant withherantithrombotic medications Patient counseled on Lifestyle modifications including, Diet, Exercise, and Stress Follow up with [facility] in 6 weeks with NP Aaron Mose OF STROKES: Old bilateral basal ganglia and thalamus lacunar infarcts, recent admission for infarction in the posterior left basal ganglia, posterior limb internal capsule and radiating white matter tracta  DYSPHAGIA: NPO until passes SLP swallow evaluation Aspiration Precautions in progress  R/O AFIB: May need 30 day cardiac event monitor at discharge  HYPERTENSION: Stable, some elevated B/P's noted since admission Permissive hypertension (OK if <220/120) for 24-48 hours post stroke and then gradually normalized within 5-7 days. Labetolol PRN Long term BP goal normotensive.  Home Meds: NONE  HYPERLIPIDEMIA 03/22/2017: Labs(Brief)          Component Value Date/Time   CHOL 203 (H) 03/22/2017 0344   TRIG 90 03/22/2017 0344   HDL 68 03/22/2017 0344   CHOLHDL 3.0 03/22/2017 0344   VLDL 18  03/22/2017 0344   LDLCALC 117 (H) 03/22/2017 0344    Home Meds:  NONE LDL  goal < 70 Started on  Lipitor daily at last admission, continue Continue statin at discharge  DIABETES: 03/2017 RecentLabs       Lab Results  Component Value Date   HGBA1C 6.3 (H) 03/22/2017      HgbA1c goal < 7.0 Continue CBG monitoring and SSI to maintain glucose 140-180 mg/dl DM education   Other Active Problems: Principal Problem:   Stroke [facility]) Active Problems:   Fall   Fracture of femoral neck, left, closed (HCC)    Protein-calorie malnutrition, severe    Hospital day # 2  Personally examined patient and images, and have participated in and made any corrections needed to history, physical, neuro exam,assessment and plan  as stated above.  I have personally obtained the history, evaluated lab date, reviewed imaging studies and agree with radiology interpretations.    Sarina Ill, MD Stroke Neurology  Stroke will sign off at this time. Please re-consult as necessary. F/u with [name] in 4-6 weeks.  To contact Stroke Continuity provider, please refer to [email or web address] After hours, contact General Neurology

## 2017-04-22 NOTE — Care Management Note (Signed)
Case Management Note  Patient Details  Name: Alyssa Savage MRN: [id number] Date of Birth: Jan 10, 1931  Subjective/Objective:   Pt admitted from [facility] with Stroke.                  Action/Plan: Plan is for patient to return to [facility]. MD please place orders for PT/OT as appropriate. CM following.   Expected Discharge Date:                  Expected Discharge Plan:  Skilled Nursing Facility  In-House Referral:  Clinical Social Work  Discharge planning Services     Post Acute Care Choice:    Choice offered to:     DME Arranged:    DME Agency:     HH Arranged:    [place] Agency:     Status of Service:  In process, will continue to follow  If discussed at Long Length of Stay Meetings, dates discussed:    Additional Comments:  [name], RN 04/22/2017, 4:22 PM

## 2017-04-22 NOTE — NC FL2 (Signed)
[place] MEDICAID FL2 LEVEL OF CARE SCREENING TOOL     IDENTIFICATION  Patient Name: Alyssa Savage Birthdate: [birth year] Sex: female Admission Date (Current Location): 04/19/2017  [facility] and [place] Number:  Herbalist and Address:  [facility], [place] [address], [place], [place] [address]      Provider Number: [id number]  Attending Physician Name and Address:  Samuella Cota, MD  Relative Name and Phone Number:       Current Level of Care: Hospital Recommended Level of Care: [place] Prior Approval Number:    Date Approved/Denied:   PASRR Number: [id number]  Discharge Plan: SNF    Current Diagnoses: Patient Active Problem List   Diagnosis Date Noted  . Acute CVA (cerebrovascular accident) ([place]) 04/22/2017  . CKD (chronic kidney disease), stage III ([place]) 04/20/2017  . Acute encephalopathy 04/19/2017  . UTI (urinary tract infection) 04/19/2017  . Dysphagia 04/19/2017  . Displaced fracture of left femoral neck ([place]) 03/25/2017  . Protein-calorie malnutrition, severe 03/24/2017  . Fall 03/21/2017  . Fracture of femoral neck, left, closed ([place]) 03/21/2017  . AKI (acute kidney injury) ([place]) 03/21/2017  . Hypertensive emergency 03/21/2017  . Elevated troponin 03/21/2017  . Hypokalemia 03/21/2017  . Stroke ([place]) 03/21/2017  . GERD (gastroesophageal reflux disease)   . Closed fracture of left hip ([place])   . Prolonged Q-T interval on ECG     Orientation RESPIRATION BLADDER Height & Weight     (Pt is non verbal )  Normal Incontinent Weight: 116 lb 10 oz (52.9 kg) Height:  5' (152.4 cm)(approximately 5' per family)  BEHAVIORAL SYMPTOMS/MOOD NEUROLOGICAL BOWEL NUTRITION STATUS      Incontinent Diet(NPO at this time)  AMBULATORY STATUS COMMUNICATION OF NEEDS Skin   Extensive Assist Non-Verbally Surgical wounds, Wound Vac(Closed incision left hip, no dressing. Closed incision left anterior hip.)                        Personal Care Assistance Level of Assistance  Dressing, Feeding, Bathing Bathing Assistance: Maximum assistance Feeding assistance: Limited assistance Dressing Assistance: Maximum assistance     Functional Limitations Info  Sight, Hearing, Speech Sight Info: Adequate Hearing Info: Adequate Speech Info: (Non verbal)    SPECIAL CARE FACTORS FREQUENCY  PT (By licensed PT), OT (By licensed OT)     PT Frequency: 5x OT Frequency: 5x            Contractures Contractures Info: Not present    Additional Factors Info  Code Status, Allergies Code Status Info: FULL Allergies Info:  No known allergies           Current Medications (04/22/2017):  This is the current hospital active medication list Current Facility-Administered Medications  Medication Dose Route Frequency Provider Last Rate Last Dose  .  stroke: mapping our early stages of recovery book   Does not apply Once Samuella Cota, MD      . acetaminophen (TYLENOL) tablet 650 mg  650 mg Per Tube Q6H PRN Samuella Cota, MD       Or  . acetaminophen (TYLENOL) suppository 650 mg  650 mg Rectal Q6H PRN Samuella Cota, MD      . atorvastatin (LIPITOR) tablet 40 mg  40 mg Per Tube q1800 Radene Gunning, NP   40 mg at 04/21/17 1743  . carvedilol (COREG) tablet 3.125 mg  3.125 mg Per Tube BID WC Black, Lezlie Octave, NP  3.125 mg at 04/22/17 1024  . clopidogrel (PLAVIX) tablet 75 mg  75 mg Per Tube Daily Velna Ochs, MD   75 mg at 04/22/17 1024  . clotrimazole (GYNE-LOTRIMIN) vaginal cream 1 Applicatorful  1 Applicatorful Vaginal QHS Dessa Phi, DO   1 Applicatorful at 75/17/00 2051  . enoxaparin (LOVENOX) injection 30 mg  30 mg Subcutaneous Q24H Radene Gunning, NP   30 mg at 04/22/17 1024  . feeding supplement (OSMOLITE 1.2 CAL) liquid 1,000 mL  1,000 mL Per Tube Continuous Samuella Cota, MD 50 mL/hr at 04/20/17 1307 1,000 mL at 04/20/17 1307  . fluconazole (DIFLUCAN) tablet 200 mg  200 mg Per Tube  Q24H Samuella Cota, MD   200 mg at 04/21/17 1743  . hydrALAZINE (APRESOLINE) injection 5 mg  5 mg Intravenous Q4H PRN Radene Gunning, NP   5 mg at 04/21/17 2050  . HYDROcodone-acetaminophen (NORCO/VICODIN) 5-325 MG per tablet 1-2 tablet  1-2 tablet Per Tube Q6H PRN Black, Lezlie Octave, NP      . magnesium sulfate IVPB 2 g 50 mL  2 g Intravenous Once Black, Karen M, NP      . polyvinyl alcohol (LIQUIFILM TEARS) 1.4 % ophthalmic solution 2 drop  2 drop Both Eyes PRN Dessa Phi, DO      . senna-docusate (Senokot-S) tablet 1 tablet  1 tablet Per Tube QHS PRN Radene Gunning, NP      . traZODone (DESYREL) tablet 25 mg  25 mg Oral QHS PRN Radene Gunning, NP   25 mg at 04/19/17 2152     Discharge Medications: Please see discharge summary for a list of discharge medications.  Relevant Imaging Results:  Relevant Lab Results:   Additional Information SSN: [id number]  Eileen Stanford, LCSW

## 2017-04-23 LAB — GLUCOSE, CAPILLARY
GLUCOSE-CAPILLARY: 108 mg/dL — AB (ref 65–99)
GLUCOSE-CAPILLARY: 177 mg/dL — AB (ref 65–99)
Glucose-Capillary: 107 mg/dL — ABNORMAL HIGH (ref 65–99)
Glucose-Capillary: 186 mg/dL — ABNORMAL HIGH (ref 65–99)

## 2017-04-23 NOTE — Progress Notes (Signed)
PROGRESS NOTE  Alyssa Savage IWP:[id number] DOB: March 15, 1930 DOA: 04/19/2017 PCP: Patient, No Pcp Per  HPI/Recap of past 31 hours: 84 year old woman PMH recent stroke with right-sided hemiparesis, severe dysphagia, status post PEG, left hip fracture, presented with acute encephalopathy from skilled nursing facility, previously verbal but over the last 2 days prior to presentation "stopped talking".  CT head without acute changes.  MRI showed small acute infarct.  Neurology consulted. Admitted for further management  Today met patient at bedside, easily arousable, nonverbal, was able to nod to questions asked.  Looks comfortable   Assessment/Plan: Principal Problem:   Acute CVA (cerebrovascular accident) ([place]) Active Problems:   Stroke ([place])   Protein-calorie malnutrition, severe   Acute encephalopathy   UTI (urinary tract infection)   Dysphagia   CKD (chronic kidney disease), stage III (HCC)  Small acute infarct right paramedian pons MRI showed small acute infarct of the right paramedian pons  Echo with EF of 60-65% LDL 117, A1c 6.3 PT/OT/SLP Neurology on board changed aspirin to Plavix, continue Lipitor Follow-up with neurology outpatient in 4-6 weeks  Acute anabolic encephalopathy Likely improved, remains nonverbal but does follow simple commands, alert Unlikely infectious  UTI Continue fluconazole.  PMH left MCA stroke with right-sided weakness Severe dysphagia, status post PEG tube. Continue PEG tube feeds.  Remain n.p.o.  Chronic kidney disease stage III Stable  Elevated transaminases Trending down, possibly related to tube feeds Follow-up as an outpatient.  Recent left hip fracture, status post left hip hemiarthroplasty Continue Lovenox for DVT prophylaxis Continue PT/OT  Severe malnutrition Per dietitian    Code Status: Full  Family Communication: None at bedside  Disposition Plan: Back to  SNF   Consultants:  Neurology  Procedures:  None  Antimicrobials:  Status post ceftriaxone  Fluconazole  DVT prophylaxis: Lovenox   Objective: Vitals:   04/23/17 0000 04/23/17 0433 04/23/17 0749 04/23/17 1301  BP: 136/78 (!) 164/82 (!) 170/82 (!) (P) 169/94  Pulse: 78 93 (!) 103 (P) 93  Resp: _0 (P) 20  Temp: 98.9 F (37.2 C) 98.9 F (37.2 C) 98.8 F (37.1 C) (P) 97.8 F (36.6 C)  TempSrc: Oral Oral Oral (P) Oral  SpO2: 97% 99% 100% (P) 99%  Weight:      Height:        Intake/Output Summary (Last 24 hours) at 04/23/2017 1410 Last data filed at 04/23/2017 0900 Gross per 24 hour  Intake 750 ml  Output -  Net 750 ml   Filed Weights   04/20/17 0113 04/21/17 0414 04/22/17 0500  Weight: 54.2 kg (119 lb 7.8 oz) 54.1 kg (119 lb 4.3 oz) 52.9 kg (116 lb 10 oz)    Exam:   General: NAD, looks comfortable  Cardiovascular: S1, S2 present  Respiratory: CTAB  Abdomen: Soft, nontender, nondistended, bowel sounds present, PEG tube in place  Musculoskeletal: No pedal edema bilaterally  Skin: Normal  Psychiatry: Unable to assess  Neuro: Right hemiparesis   Data Reviewed: CBC: Recent Labs  Lab 04/19/17 1421 04/19/17 1516 04/19/17 1836 04/20/17 0501 04/22/17 0303  WBC 4.8  --   --  5.4 7.3  NEUTROABS 3.0  --   --   --   --   HGB 11.2* 10.5*  --  7.9* 8.6*  HCT 35.6* 31.0* 27.6* 25.3* 27.3*  MCV 88.3  --   --  87.8 87.8  PLT 272  --   --  351 505   Basic Metabolic Panel: Recent Labs  Lab 04/19/17 1421  04/19/17 1516 04/20/17 0501  NA 139 142 138  K 3.9 3.9 3.7  CL 109 106 106  CO2 24  --  21*  GLUCOSE 89 83 86  BUN 21* 23* 20  CREATININE 1.00 1.00 0.96  CALCIUM 8.4*  --  8.5*   GFR: Estimated Creatinine Clearance: 31.3 mL/min (by C-G formula based on SCr of 0.96 mg/dL). Liver Function Tests: Recent Labs  Lab 04/19/17 1421 04/20/17 0501  AST 75* 61*  ALT 117* 104*  ALKPHOS 103 99  BILITOT 0.3 0.4  PROT 5.8* 5.8*  ALBUMIN 2.1*  2.0*   No results for input(s): LIPASE, AMYLASE in the last 168 hours. Recent Labs  Lab 04/19/17 1421  AMMONIA 22   Coagulation Profile: No results for input(s): INR, PROTIME in the last 168 hours. Cardiac Enzymes: No results for input(s): CKTOTAL, CKMB, CKMBINDEX, TROPONINI in the last 168 hours. BNP (last 3 results) No results for input(s): PROBNP in the last 8760 hours. HbA1C: No results for input(s): HGBA1C in the last 72 hours. CBG: Recent Labs  Lab 04/22/17 0830 04/22/17 1116 04/22/17 1620 04/23/17 0745 04/23/17 1143  GLUCAP 146* 172* 135* 108* 177*   Lipid Profile: No results for input(s): CHOL, HDL, LDLCALC, TRIG, CHOLHDL, LDLDIRECT in the last 72 hours. Thyroid Function Tests: No results for input(s): TSH, T4TOTAL, FREET4, T3FREE, THYROIDAB in the last 72 hours. Anemia Panel: No results for input(s): VITAMINB12, FOLATE, FERRITIN, TIBC, IRON, RETICCTPCT in the last 72 hours. Urine analysis:    Component Value Date/Time   COLORURINE YELLOW 04/19/2017 1421   APPEARANCEUR HAZY (A) 04/19/2017 1421   LABSPEC 1.015 04/19/2017 1421   PHURINE 7.0 04/19/2017 1421   GLUCOSEU NEGATIVE 04/19/2017 1421   HGBUR NEGATIVE 04/19/2017 1421   BILIRUBINUR NEGATIVE 04/19/2017 1421   KETONESUR NEGATIVE 04/19/2017 1421   PROTEINUR NEGATIVE 04/19/2017 1421   NITRITE NEGATIVE 04/19/2017 1421   LEUKOCYTESUR SMALL (A) 04/19/2017 1421   Sepsis Labs: _0 (procalcitonin:4,lacticidven:4)  ) Recent Results (from the past 240 hour(s))  Urine Culture     Status: Abnormal   Collection Time: 04/18/17  2:21 PM  Result Value Ref Range Status   Specimen Description URINE, CATHETERIZED  Final   Special Requests NONE  Final   Culture >=100,000 COLONIES/mL YEAST (A)  Final   Report Status 04/20/2017 FINAL  Final  Culture, blood (routine x 2)     Status: None (Preliminary result)   Collection Time: 04/19/17  3:41 PM  Result Value Ref Range Status   Specimen Description BLOOD LEFT  ANTECUBITAL  Final   Special Requests   Final    BOTTLES DRAWN AEROBIC AND ANAEROBIC Blood Culture adequate volume   Culture   Final    NO GROWTH 4 DAYS Performed at [facility], [place] [address], [place], [place] [address]    Report Status PENDING  Incomplete  Culture, blood (routine x 2)     Status: None (Preliminary result)   Collection Time: 04/19/17  6:36 PM  Result Value Ref Range Status   Specimen Description BLOOD BLOOD RIGHT WRIST  Final   Special Requests   Final    IN BOTH AEROBIC AND ANAEROBIC BOTTLES Blood Culture adequate volume   Culture   Final    NO GROWTH 4 DAYS Performed at [facility], [place] [address], [place], [place] [address]    Report Status PENDING  Incomplete  MRSA PCR Screening     Status: None   Collection Time: 04/20/17  1:21 AM  Result Value Ref Range Status   MRSA by PCR NEGATIVE NEGATIVE Final    Comment:        The GeneXpert MRSA Assay (FDA approved for NASAL specimens only), is one component of a comprehensive MRSA colonization surveillance program. It is not intended to diagnose MRSA infection nor to guide or monitor treatment for MRSA infections. Performed at [facility], [place] [address] [place], [place] [address]       Studies: No results found.  Scheduled Meds: . atorvastatin  40 mg Per Tube q1800  . carvedilol  3.125 mg Per Tube BID WC  . clopidogrel  75 mg Per Tube Daily  . clotrimazole  1 Applicatorful Vaginal QHS  . enoxaparin  30 mg Subcutaneous Q24H  . fluconazole  200 mg Per Tube Q24H    Continuous Infusions: . feeding supplement (OSMOLITE 1.2 CAL) 1,000 mL (04/20/17 1307)  . magnesium sulfate 1 - 4 g bolus IVPB       LOS: 3 days     Alma Friendly, MD [facility]   If 7PM-7AM, please contact night-coverage [email or web address] Password [email or web address] 04/23/2017, 2:10 PM

## 2017-04-23 NOTE — Progress Notes (Signed)
Occupational Therapy Evaluation Patient Details Name: Alyssa Savage MRN: [id number] DOB: [birth year] Today's Date: 04/23/2017    History of Present Illness  84 year old female who was recently admitted to [facility] with an acute infarction in the posterior left basal ganglia, posterior limb internal capsule and radiating white matter tracts resulting in R hemiplegia. Pt had sustained a fall prior to the CVA and underwent a L THA - ant approach and was WBAT. Now admitted for acute CVA in R paramedian pons resulting in L sided weakness. Pt with PEG from CVA in 03/2017.  Past medical history diabetes, hyperlipidemia and hypertension, previous strokes.     Clinical Impression   Familiar with this pt from prior admission. Prior to CVA in February, pt lived alone and was completely independent, enjoyed gardening and had just retired from [employer]. Since CVA in February, per family friend present for eval, pt had been participating well with therapy at snf. Pt presents with a significant functional status change, requiring total A +2 for mobility and total A for ADL. Pt is able to communicate appropriately with head nods and indicates she remembers this therapist from previous admission.  Pt demonstrates minimal movement in LUE/LE. Pt demonstrates active L hand, elbow and shoulder movement but is unable to use functionally at this time. Pt is at high risk for skin breakdown. Recommend B Prevalon boots to wear while in bed. Will follow acutely to facilitate DC back to SNF for rehab. Recommend palliative medicine consult.     Follow Up Recommendations  SNF;Supervision/Assistance - 24 hour    Equipment Recommendations  None recommended by OT    Recommendations for Other Services Other (comment)(Palliative Medicine)     Precautions / Restrictions Precautions Precautions: Fall Precaution Comments: at risk for skin breakdown Restrictions Weight Bearing Restrictions: Yes LLE Weight Bearing: Weight  bearing as tolerated      Mobility Bed Mobility Overal bed mobility: Needs Assistance Bed Mobility: Supine to Sit;Sit to Supine     Supine to sit: Total assist;+2 for physical assistance Sit to supine: Total assist;+2 for physical assistance      Transfers                 General transfer comment: not attempted    Balance Overall balance assessment: Needs assistance   Sitting balance-Leahy Scale: Zero                                     ADL either performed or assessed with clinical judgement   ADL   Eating/Feeding: NPO(peg; difficulty keeping her tongue in her mouth; apparent di)                                   Functional mobility during ADLs: Total assistance;+2 for physical assistance General ADL Comments: total A with all ADL     Vision   Vision Assessment?: Vision impaired- to be further tested in functional context     Perception     Praxis      Pertinent Vitals/Pain Pain Assessment: Faces Faces Pain Scale: Hurts little more Pain Location: with L hip movement Pain Descriptors / Indicators: Grimacing Pain Intervention(s): Limited activity within patient's tolerance     Hand Dominance Right   Extremity/Trunk Assessment Upper Extremity Assessment Upper Extremity Assessment: RUE deficits/detail;LUE deficits/detail RUE Deficits / Details: nonfunctional RUE. Brunstrom stage 1;  increased flexor tone LUE Deficits / Details: Significant weakness; isolated joint movement - gross grasp/release - able to hold washcloth and assist with wiping her mouth; elbow flexion/extension present  @ 2/5. minimal shoulder movement; Ablet o assist with propping using LUE LUE Coordination: decreased fine motor;decreased gross motor   Lower Extremity Assessment Lower Extremity Assessment: LLE deficits/detail LLE Deficits / Details: ableto elicit leg ext/flex and minimal dorsiflexion on command; painful with hip movement but pt trying to  initiate movement   Cervical / Trunk Assessment Cervical / Trunk Assessment: Kyphotic(forward head)   Communication Communication Communication: Expressive difficulties(nonverbal during assessement, but nodding appropirately)   Cognition Arousal/Alertness: Awake/alert Behavior During Therapy: Flat affect Overall Cognitive Status: Difficult to assess                                 General Comments: Pt appeared to be nodding head appropriately and attempting to follow commands; nodded head "yes" that she remembered this therapist from previous admission; Pt able to communicate through nods/ye/no that whe wanted a towel on her chest    General Comments       Exercises Exercises: General Upper Extremity General Exercises - Upper Extremity Shoulder Flexion: Left;10 reps;AAROM;Seated Elbow Flexion: AAROM;Left;10 reps;Seated Elbow Extension: AAROM;Left;10 reps;Seated Composite Extension: AAROM;Left;10 reps;Seated   Shoulder Instructions      Home Living Family/patient expects to be discharged to:: Skilled nursing facility                                        Prior Functioning/Environment Level of Independence: Independent        Comments: Prior to CVA in February, pt completely independent; family states pt was participating well wtih therapy at SNF        OT Problem List: Decreased strength;Decreased range of motion;Decreased activity tolerance;Impaired balance (sitting and/or standing);Decreased cognition;Impaired vision/perception;Decreased coordination;Decreased safety awareness;Decreased knowledge of use of DME or AE;Decreased knowledge of precautions;Impaired sensation;Impaired tone;Impaired UE functional use;Pain;Increased edema      OT Treatment/Interventions: Self-care/ADL training;Therapeutic exercise;Neuromuscular education;DME and/or AE instruction;Splinting;Therapeutic activities;Patient/family education;Balance training;Cognitive  remediation/compensation;Visual/perceptual remediation/compensation    OT Goals(Current goals can be found in the care plan section) Acute Rehab OT Goals Patient Stated Goal: unable to state OT Goal Formulation: With family Time For Goal Achievement: 05/07/17 Potential to Achieve Goals: Fair  OT Frequency: Min 2X/week   Barriers to D/C:            Co-evaluation PT/OT/SLP Co-Evaluation/Treatment: Yes Reason for Co-Treatment: Complexity of the patient's impairments (multi-system involvement);To address functional/ADL transfers   OT goals addressed during session: ADL's and self-care      AM-PAC PT "6 Clicks" Daily Activity     Outcome Measure Help from another Hank eating meals?: Total Help from another [place] taking care of personal grooming?: Total Help from another [place] toileting, which includes using toliet, bedpan, or urinal?: Total Help from another [place] bathing (including washing, rinsing, drying)?: Total Help from another [place] to put on and taking off regular upper body clothing?: Total Help from another [place] to put on and taking off regular lower body clothing?: Total 6 Click Score: 6   End of Session Nurse Communication: Mobility status  Activity Tolerance: Patient tolerated treatment well Patient left: in bed;with call bell/phone within reach;with bed alarm set;with family/visitor present  OT Visit Diagnosis: Other abnormalities of gait and  mobility (R26.89);Muscle weakness (generalized) (M62.81);Cognitive communication deficit (R41.841);Hemiplegia and hemiparesis;Pain Symptoms and signs involving cognitive functions: Cerebral infarction Hemiplegia - Right/Left: (both) Hemiplegia - dominant/non-dominant: Non-Dominant Hemiplegia - caused by: Cerebral infarction Pain - Right/Left: Left Pain - part of body: Hip                Time: 3403-5248 OT Time Calculation (min): 28 min Charges:  OT General Charges $OT Visit: 1 Visit OT Evaluation $OT Eval  Moderate Complexity: 1 Mod G-Codes:     Endia Moncur, OT/L  [phone] 04/23/2017  Elika Godar,HILLARY 04/23/2017, 11:05 AM

## 2017-04-23 NOTE — Evaluation (Signed)
Physical Therapy Evaluation Patient Details Name: Alyssa Savage MRN: [id number] DOB: March 24, 1930 Today's Date: 04/23/2017   History of Present Illness  Pt is an 84 year old female who was recently admitted to [facility] with an acute infarction in the posterior left basal ganglia, posterior limb internal capsule and radiating white matter tracts resulting in R hemiplegia. Pt had sustained a fall prior to the CVA adn underwent a L THA - ant approach and was WBAT. Now admitted for acute CVA in R paramedian pons resulting in L sided weakness. Pt with PEG from CVA in 03/2017.  Past medical history diabetes, hyperlipidemia and hypertension, previous strokes.      Clinical Impression  Pt presented supine in bed with HOB elevated, awake and willing to participate in therapy session. Pt's family member "Cecilie Lowers" present throughout session as well and provided PLOF information. Prior to initial CVA in February of this year, pt was completely independent and still working. Pt currently requires total A x2 for bed mobility and total A to maintain upright sitting balance at EOB. Pt would continue to benefit from skilled physical therapy services at this time while admitted and after d/c to address the below listed limitations in order to improve overall safety and independence with functional mobility.     Follow Up Recommendations SNF;Supervision/Assistance - 24 hour    Equipment Recommendations  None recommended by PT    Recommendations for Other Services Other (comment)(PALLIATIVE CONSULT)     Precautions / Restrictions Precautions Precautions: Fall Precaution Comments: at risk for skin breakdown Restrictions Weight Bearing Restrictions: Yes LLE Weight Bearing: Weight bearing as tolerated      Mobility  Bed Mobility Overal bed mobility: Needs Assistance Bed Mobility: Supine to Sit;Sit to Supine     Supine to sit: Total assist;+2 for physical assistance Sit to supine: Total assist;+2 for physical  assistance   General bed mobility comments: total A for all aspects with use of bed pads  Transfers                 General transfer comment: not attempted  Ambulation/Gait                Stairs            Wheelchair Mobility    Modified Rankin (Stroke Patients Only) Modified Rankin (Stroke Patients Only) Pre-Morbid Rankin Score: Moderately severe disability Modified Rankin: Moderately severe disability     Balance Overall balance assessment: Needs assistance   Sitting balance-Leahy Scale: Zero                                       Pertinent Vitals/Pain Pain Assessment: Faces Faces Pain Scale: Hurts little more Pain Location: with L hip movement Pain Descriptors / Indicators: Grimacing Pain Intervention(s): Monitored during session;Repositioned    Home Living Family/patient expects to be discharged to:: Skilled nursing facility                      Prior Function Level of Independence: Independent         Comments: Prior to CVA in February, pt completely independent; family states pt was participating well with therapy at SNF     Hand Dominance        Extremity/Trunk Assessment   Upper Extremity Assessment Upper Extremity Assessment: Defer to OT evaluation    Lower Extremity Assessment Lower Extremity Assessment: RLE deficits/detail;LLE deficits/detail RLE Deficits /  Details: no active movement of R LE, pitting edema noted in dorsal aspect of foot LLE Deficits / Details: ableto elicit leg ext/flex and minimal dorsiflexion on command; painful with hip movement but pt trying to initiate movement    Cervical / Trunk Assessment Cervical / Trunk Assessment: Kyphotic(anteriorly rounded shoulders, forward head position)  Communication   Communication: Expressive difficulties;Other (comment)(nonverbal during evaluation but able to nod/shake head )  Cognition Arousal/Alertness: Awake/alert Behavior During Therapy:  Flat affect Overall Cognitive Status: Difficult to assess                                        General Comments      Exercises     Assessment/Plan    PT Assessment Patient needs continued PT services  PT Problem List Decreased strength;Decreased range of motion;Decreased activity tolerance;Decreased balance;Decreased mobility;Decreased coordination;Decreased cognition;Decreased knowledge of use of DME;Decreased safety awareness;Decreased knowledge of precautions;Pain       PT Treatment Interventions DME instruction;Functional mobility training;Therapeutic activities;Therapeutic exercise;Balance training;Neuromuscular re-education;Patient/family education    PT Goals (Current goals can be found in the Care Plan section)  Acute Rehab PT Goals Patient Stated Goal: unable to state PT Goal Formulation: Patient unable to participate in goal setting Time For Goal Achievement: 05/07/17 Potential to Achieve Goals: Fair    Frequency Min 3X/week   Barriers to discharge        Co-evaluation PT/OT/SLP Co-Evaluation/Treatment: Yes Reason for Co-Treatment: Complexity of the patient's impairments (multi-system involvement);For patient/therapist safety;To address functional/ADL transfers PT goals addressed during session: Mobility/safety with mobility;Balance;Strengthening/ROM         AM-PAC PT "6 Clicks" Daily Activity  Outcome Measure Difficulty turning over in bed (including adjusting bedclothes, sheets and blankets)?: Unable Difficulty moving from lying on back to sitting on the side of the bed? : Unable Difficulty sitting down on and standing up from a chair with arms (e.g., wheelchair, bedside commode, etc,.)?: Unable Help needed moving to and from a bed to chair (including a wheelchair)?: Total Help needed walking in hospital room?: Total Help needed climbing 3-5 steps with a railing? : Total 6 Click Score: 6    End of Session   Activity Tolerance: Patient  limited by fatigue Patient left: in bed;with call bell/phone within reach;with family/visitor present Nurse Communication: Mobility status PT Visit Diagnosis: Other abnormalities of gait and mobility (R26.89);Other symptoms and signs involving the nervous system (R29.898);Pain Pain - Right/Left: Left Pain - part of body: Hip    Time: 8250-0370 PT Time Calculation (min) (ACUTE ONLY): 30 min   Charges:   PT Evaluation $PT Eval Moderate Complexity: 1 Mod     PT G Codes:        [place], PT, DPT [place] 04/23/2017, 2:20 PM

## 2017-04-24 LAB — BASIC METABOLIC PANEL
ANION GAP: 8 (ref 5–15)
BUN: 26 mg/dL — AB (ref 6–20)
CALCIUM: 8.6 mg/dL — AB (ref 8.9–10.3)
CO2: 23 mmol/L (ref 22–32)
Chloride: 113 mmol/L — ABNORMAL HIGH (ref 101–111)
Creatinine, Ser: 0.99 mg/dL (ref 0.44–1.00)
GFR calc Af Amer: 59 mL/min — ABNORMAL LOW (ref >60)
GFR calc non Af Amer: 51 mL/min — ABNORMAL LOW (ref >60)
Glucose, Bld: 136 mg/dL — ABNORMAL HIGH (ref 65–99)
Potassium: 4.6 mmol/L (ref 3.5–5.1)
Sodium: 144 mmol/L (ref 135–145)

## 2017-04-24 LAB — CBC WITH DIFFERENTIAL/PLATELET
BASOS ABS: 0 10*3/uL (ref 0.0–0.1)
Basophils Relative: 0 %
EOS PCT: 5 %
Eosinophils Absolute: 0.3 10*3/uL (ref 0.0–0.7)
HEMATOCRIT: 26.1 % — AB (ref 36.0–46.0)
Hemoglobin: 8 g/dL — ABNORMAL LOW (ref 12.0–15.0)
LYMPHS PCT: 23 %
Lymphs Abs: 1.5 10*3/uL (ref 0.7–4.0)
MCH: 27.4 pg (ref 26.0–34.0)
MCHC: 30.7 g/dL (ref 30.0–36.0)
MCV: 89.4 fL (ref 78.0–100.0)
MONO ABS: 0.6 10*3/uL (ref 0.1–1.0)
MONOS PCT: 9 %
NEUTROS ABS: 4.3 10*3/uL (ref 1.7–7.7)
Neutrophils Relative %: 63 %
Platelets: 334 10*3/uL (ref 150–400)
RBC: 2.92 MIL/uL — ABNORMAL LOW (ref 3.87–5.11)
RDW: 17.9 % — AB (ref 11.5–15.5)
WBC: 6.8 10*3/uL (ref 4.0–10.5)

## 2017-04-24 LAB — CULTURE, BLOOD (ROUTINE X 2)
CULTURE: NO GROWTH
CULTURE: NO GROWTH
SPECIAL REQUESTS: ADEQUATE

## 2017-04-24 LAB — GLUCOSE, CAPILLARY
GLUCOSE-CAPILLARY: 144 mg/dL — AB (ref 65–99)
GLUCOSE-CAPILLARY: 167 mg/dL — AB (ref 65–99)
Glucose-Capillary: 132 mg/dL — ABNORMAL HIGH (ref 65–99)
Glucose-Capillary: 144 mg/dL — ABNORMAL HIGH (ref 65–99)

## 2017-04-24 MED ORDER — WHITE PETROLATUM EX OINT
TOPICAL_OINTMENT | CUTANEOUS | Status: AC
  Administered 2017-04-24: 15:00:00
  Filled 2017-04-24: qty 28.35

## 2017-04-24 NOTE — Evaluation (Signed)
Speech Language Pathology Evaluation Patient Details Name: Alyssa Savage MRN: [id number] DOB: 1930-12-20 Today's Date: 04/24/2017 Time: 8101-7510 SLP Time Calculation (min) (ACUTE ONLY): 20 min  Problem List:  Patient Active Problem List   Diagnosis Date Noted  . Acute CVA (cerebrovascular accident) ([place]) 04/22/2017  . CKD (chronic kidney disease), stage III ([place]) 04/20/2017  . Acute encephalopathy 04/19/2017  . UTI (urinary tract infection) 04/19/2017  . Dysphagia 04/19/2017  . Displaced fracture of left femoral neck ([place]) 03/25/2017  . Protein-calorie malnutrition, severe 03/24/2017  . Fall 03/21/2017  . Fracture of femoral neck, left, closed ([place]) 03/21/2017  . AKI (acute kidney injury) ([place]) 03/21/2017  . Hypertensive emergency 03/21/2017  . Elevated troponin 03/21/2017  . Hypokalemia 03/21/2017  . Stroke ([place]) 03/21/2017  . GERD (gastroesophageal reflux disease)   . Closed fracture of left hip ([place])   . Prolonged Q-T interval on ECG    Past Medical History:  Past Medical History:  Diagnosis Date  . Femoral neck fracture ([place]) 03/2017   left   . GERD (gastroesophageal reflux disease)   . Hypertensive emergency 03/2017  . Hypokalemia 03/2017  . Stroke [facility])    Past Surgical History:  Past Surgical History:  Procedure Laterality Date  . ANTERIOR APPROACH HEMI HIP ARTHROPLASTY Left 03/25/2017   Procedure: ANTERIOR APPROACH HEMI HIP ARTHROPLASTY;  Surgeon: Rod Can, MD;  Location: [place];  Service: Orthopedics;  Laterality: Left;  . IR GASTROSTOMY TUBE MOD SED  03/31/2017  . NO PAST SURGERIES     HPI:  Pt is an 84 y.o. female with PMH of recent stroke with residual right sided deficit, severe dysphagia with PEG in place, left hip hx, HTN who presents with 2 day history of acute encephalopathy. She is transferred from [facility], admitted 3/19. Report states she had right-sided deficits but was verbal and was able to communicate. Two days ago, she "just stopped talking."  MRI showed "Small acute infarct of the right paramedian pons, subacute infarct of the left basal ganglia, unchanged in size, numerous scattered chronic microhemorrhages throughout the brain, unchanged." Per MD note 3/23 pt continues to be nonverbal but nodding to questions asked. Following recent stroke in Feb 2019, pt had adequate expressive/ receptive language/ cognition but moderate-severe dysrathria. Cognitive linguistic evaluation was ordered.    Assessment / Plan / Recommendation Clinical Impression  Pt currently presenting with a profound dysarthria impacting voice and speech production; speech production attempts are essentially 0% intelligible as pt currently has very little labial/ lingual movement. Receptive language is relatively intact for tasks assessed- pt answering yes/ no questions accurately and following 2-step directions, gesturing toward named items in room. Expressive language was difficult to assess due to dysarthria; during automatic language tasks (stating name, days of week, etc) pt moved mouth and appeared to attempt approximations although no voice production with it. Pt was provided with a communication board; this was also a challenge for the pt to use due to impaired motor skills for pointing. For now, asking yes/ no questions is the best modality for having wants/ needs met; this was reviewed with RN. Pt will benefit from continued speech therapy to target speech production/ identify functional means of communicating; prognosis for improvement is fair given dysarthria PTA and severity of impairments.     SLP Assessment  SLP Recommendation/Assessment: Patient needs continued Speech Lanaguage Pathology Services SLP Visit Diagnosis: Dysarthria and anarthria (R47.1)    Follow Up Recommendations  Skilled Nursing facility    Frequency and Duration min 3x week  2 weeks      SLP Evaluation Cognition  Overall Cognitive Status: Difficult to assess Arousal/Alertness:  Awake/alert Orientation Level: Oriented to [place];Oriented to place;Other (comment)(difficult to assess orientation to time/ situation) Attention: Selective Sustained Attention: Appears intact       Comprehension  Auditory Comprehension Overall Auditory Comprehension: Appears within functional limits for tasks assessed Yes/No Questions: Within Functional Limits Commands: Within Functional Limits Conversation: Other (comment)(unable- nonverbal) Reading Comprehension Reading Status: Not tested    Expression Expression Primary Mode of Expression: Other (comment)(nonverbal- functional communication system not established) Verbal Expression Overall Verbal Expression: Impaired Initiation: Impaired Automatic Speech: Counting;Day of week Level of Generative/Spontaneous Verbalization: Word Repetition: Impaired Naming: Impairment Pragmatics: No impairment Non-Verbal Means of Communication: Communication board(attempted in room- imprecise but may help guide yes/ no ques) Written Expression Written Expression: Unable to assess (comment)   Oral / Motor  Oral Motor/Sensory Function Overall Oral Motor/Sensory Function: Severe impairment Facial ROM: Reduced left;Suspected CN VII (facial) dysfunction Lingual ROM: Reduced right;Reduced left;Suspected CN XII (hypoglossal) dysfunction Lingual Strength: Reduced Motor Speech Overall Motor Speech: Impaired Respiration: Within functional limits Phonation: Aphonic;Hoarse Articulation: Impaired Level of Impairment: Word Intelligibility: Intelligibility reduced Word: 0-24% accurate   GO                    Kern Reap, MA, CCC-SLP 04/24/2017, 2:03 PM  [phone]

## 2017-04-24 NOTE — Progress Notes (Signed)
PROGRESS NOTE  Alyssa Savage OIT:[id number] DOB: Mar 17, 1930 DOA: 04/19/2017 PCP: Patient, No Pcp Per  HPI/Recap of past 42 hours: 84 year old woman PMH recent stroke with right-sided hemiparesis, severe dysphagia, status post PEG, left hip fracture, presented with acute encephalopathy from skilled nursing facility, previously verbal but over the last 2 days prior to presentation "stopped talking".  CT head without acute changes.  MRI showed small acute infarct.  Neurology consulted. Admitted for further management  Today met patient at bedside, easily arousable, nonverbal, was able to nod to questions asked.  Looks comfortable.  04/24/2017-patient seen.  No significant history from patient.  Patient is aphasic.  Disposition is been coordinated.  Hopefully, the patient be discharged back to skilled nursing facility tomorrow.     Assessment/Plan: Principal Problem:   Acute CVA (cerebrovascular accident) ([place]) Active Problems:   Stroke ([place])   Protein-calorie malnutrition, severe   Acute encephalopathy   UTI (urinary tract infection)   Dysphagia   CKD (chronic kidney disease), stage III (H[facility])  Small acute infarct right paramedian pons MRI showed small acute infarct of the right paramedian pons  Echo with EF of 60-65% LDL 117, A1c 6.3 PT/OT/SLP Neurology on board changed aspirin to Plavix, continue Lipitor Follow-up with neurology outpatient in 4-6 weeks  Acute anabolic encephalopathy Likely improved, remains nonverbal but does follow simple commands, alert Unlikely infectious  UTI Continue fluconazole.  PMH left MCA stroke with right-sided weakness Severe dysphagia, status post PEG tube. Continue PEG tube feeds.  Remain n.p.o.  Chronic kidney disease stage III Stable  Elevated transaminases Trending down, possibly related to tube feeds Follow-up as an outpatient.  Recent left hip fracture, status post left hip hemiarthroplasty Continue Lovenox for DVT  prophylaxis Continue PT/OT  Severe malnutrition Per dietitian    Code Status: Full  Family Communication: None at bedside  Disposition Plan: Back to SNF   Consultants:  Neurology  Procedures:  None  Antimicrobials:  Status post ceftriaxone  Fluconazole  DVT prophylaxis: Lovenox   Objective: Vitals:   04/24/17 0000 04/24/17 0500 04/24/17 0901 04/24/17 1154  BP: (!) 169/90 (!) 171/90 (!) 144/88 (!) 160/90  Pulse: 94 97 90 93  Resp: _0 Temp: 98.8 F (37.1 C) 99.8 F (37.7 C) 98.4 F (36.9 C) 98.1 F (36.7 C)  TempSrc: Axillary Axillary Axillary Axillary  SpO2: 97% 98% 98% 100%  Weight:  53.3 kg (117 lb 8.1 oz)    Height:        Intake/Output Summary (Last 24 hours) at 04/24/2017 1643 Last data filed at 04/24/2017 0500 Gross per 24 hour  Intake -  Output 600 ml  Net -600 ml   Filed Weights   04/21/17 0414 04/22/17 0500 04/24/17 0500  Weight: 54.1 kg (119 lb 4.3 oz) 52.9 kg (116 lb 10 oz) 53.3 kg (117 lb 8.1 oz)    Exam:   General: NAD, looks comfortable  Cardiovascular: S1, S2 present  Respiratory: CTAB  Abdomen: Soft, nontender, nondistended, bowel sounds present, PEG tube in place  Musculoskeletal: No pedal edema bilaterally  Skin: Normal  Psychiatry: Unable to assess  Neuro: Right hemiparesis   Data Reviewed: CBC: Recent Labs  Lab 04/19/17 1421 04/19/17 1516 04/19/17 1836 04/20/17 0501 04/22/17 0303 04/24/17 0425  WBC 4.8  --   --  5.4 7.3 6.8  NEUTROABS 3.0  --   --   --   --  4.3  HGB 11.2* 10.5*  --  7.9* 8.6* 8.0*  HCT 35.6* 31.0*  27.6* 25.3* 27.3* 26.1*  MCV 88.3  --   --  87.8 87.8 89.4  PLT 272  --   --  351 388 619   Basic Metabolic Panel: Recent Labs  Lab 04/19/17 1421 04/19/17 1516 04/20/17 0501 04/24/17 0425  NA 139 142 138 144  K 3.9 3.9 3.7 4.6  CL 109 106 106 113*  CO2 24  --  21* 23  GLUCOSE 89 83 86 136*  BUN 21* 23* 20 26*  CREATININE 1.00 1.00 0.96 0.99  CALCIUM 8.4*  --  8.5*  8.6*   GFR: Estimated Creatinine Clearance: 30.4 mL/min (by C-G formula based on SCr of 0.99 mg/dL). Liver Function Tests: Recent Labs  Lab 04/19/17 1421 04/20/17 0501  AST 75* 61*  ALT 117* 104*  ALKPHOS 103 99  BILITOT 0.3 0.4  PROT 5.8* 5.8*  ALBUMIN 2.1* 2.0*   No results for input(s): LIPASE, AMYLASE in the last 168 hours. Recent Labs  Lab 04/19/17 1421  AMMONIA 22   Coagulation Profile: No results for input(s): INR, PROTIME in the last 168 hours. Cardiac Enzymes: No results for input(s): CKTOTAL, CKMB, CKMBINDEX, TROPONINI in the last 168 hours. BNP (last 3 results) No results for input(s): PROBNP in the last 8760 hours. HbA1C: No results for input(s): HGBA1C in the last 72 hours. CBG: Recent Labs  Lab 04/23/17 1559 04/23/17 2157 04/24/17 0623 04/24/17 0817 04/24/17 1156  GLUCAP 107* 186* 144* 132* 167*   Lipid Profile: No results for input(s): CHOL, HDL, LDLCALC, TRIG, CHOLHDL, LDLDIRECT in the last 72 hours. Thyroid Function Tests: No results for input(s): TSH, T4TOTAL, FREET4, T3FREE, THYROIDAB in the last 72 hours. Anemia Panel: No results for input(s): VITAMINB12, FOLATE, FERRITIN, TIBC, IRON, RETICCTPCT in the last 72 hours. Urine analysis:    Component Value Date/Time   COLORURINE YELLOW 04/19/2017 1421   APPEARANCEUR HAZY (A) 04/19/2017 1421   LABSPEC 1.015 04/19/2017 1421   PHURINE 7.0 04/19/2017 1421   GLUCOSEU NEGATIVE 04/19/2017 1421   HGBUR NEGATIVE 04/19/2017 1421   BILIRUBINUR NEGATIVE 04/19/2017 1421   KETONESUR NEGATIVE 04/19/2017 1421   PROTEINUR NEGATIVE 04/19/2017 1421   NITRITE NEGATIVE 04/19/2017 1421   LEUKOCYTESUR SMALL (A) 04/19/2017 1421   Sepsis Labs: _0 (procalcitonin:4,lacticidven:4)  ) Recent Results (from the past 240 hour(s))  Urine Culture     Status: Abnormal   Collection Time: 04/18/17  2:21 PM  Result Value Ref Range Status   Specimen Description URINE, CATHETERIZED  Final   Special Requests NONE   Final   Culture >=100,000 COLONIES/mL YEAST (A)  Final   Report Status 04/20/2017 FINAL  Final  Culture, blood (routine x 2)     Status: None   Collection Time: 04/19/17  3:41 PM  Result Value Ref Range Status   Specimen Description BLOOD LEFT ANTECUBITAL  Final   Special Requests   Final    BOTTLES DRAWN AEROBIC AND ANAEROBIC Blood Culture adequate volume   Culture   Final    NO GROWTH 5 DAYS Performed at [facility], [place] [address] [place], [place] [address]    Report Status 04/24/2017 FINAL  Final  Culture, blood (routine x 2)     Status: None   Collection Time: 04/19/17  6:36 PM  Result Value Ref Range Status   Specimen Description BLOOD BLOOD RIGHT WRIST  Final   Special Requests   Final    IN BOTH AEROBIC AND ANAEROBIC BOTTLES Blood Culture adequate volume   Culture   Final  NO GROWTH 5 DAYS Performed at [facility], [place] [address], [place], [place] [address]    Report Status 04/24/2017 FINAL  Final  MRSA PCR Screening     Status: None   Collection Time: 04/20/17  1:21 AM  Result Value Ref Range Status   MRSA by PCR NEGATIVE NEGATIVE Final    Comment:        The GeneXpert MRSA Assay (FDA approved for NASAL specimens only), is one component of a comprehensive MRSA colonization surveillance program. It is not intended to diagnose MRSA infection nor to guide or monitor treatment for MRSA infections. Performed at [facility], [place] [address] [place], [place] [address]       Studies: No results found.  Scheduled Meds: . atorvastatin  40 mg Per Tube q1800  . carvedilol  3.125 mg Per Tube BID WC  . clopidogrel  75 mg Per Tube Daily  . clotrimazole  1 Applicatorful Vaginal QHS  . enoxaparin  30 mg Subcutaneous Q24H  . fluconazole  200 mg Per Tube Q24H    Continuous Infusions: . feeding supplement (OSMOLITE 1.2 CAL) 1,000 mL (04/23/17 1735)  . magnesium sulfate 1 - 4 g bolus IVPB       LOS: 4 days     Bonnell Public, MD [facility]   If 7PM-7AM, please contact night-coverage [email or web address] Password [facility] 04/24/2017, 4:43 PM

## 2017-04-25 ENCOUNTER — Other Ambulatory Visit: Payer: Self-pay

## 2017-04-25 LAB — GLUCOSE, CAPILLARY: Glucose-Capillary: 152 mg/dL — ABNORMAL HIGH (ref 65–99)

## 2017-04-25 MED ORDER — CLOTRIMAZOLE 1 % VA CREA: 1.0000 | TOPICAL_CREAM | Freq: Every day | VAGINAL | 0 refills | Status: DC

## 2017-04-25 MED ORDER — CLOPIDOGREL BISULFATE 75 MG PO TABS: 75.0000 mg | ORAL_TABLET | Freq: Every day | ORAL | 0 refills | Status: DC

## 2017-04-25 MED ORDER — FREE WATER: 200.0000 mL | Freq: Four times a day (QID) | Status: DC

## 2017-04-25 MED ORDER — PRO-STAT SUGAR FREE PO LIQD
30.0000 mL | Freq: Every day | ORAL | Status: DC
  Administered 2017-04-25: 30 mL
  Filled 2017-04-25: qty 30

## 2017-04-25 MED ORDER — NYSTATIN 100000 UNIT/ML MT SUSP: 5.0000 mL | Freq: Four times a day (QID) | OROMUCOSAL | 0 refills | Status: DC

## 2017-04-25 MED ORDER — NYSTATIN 100000 UNIT/ML MT SUSP
5.0000 mL | Freq: Four times a day (QID) | OROMUCOSAL | Status: DC
  Administered 2017-04-25: 500000 [IU] via ORAL
  Filled 2017-04-25 (×3): qty 5

## 2017-04-25 MED ORDER — FLUCONAZOLE 200 MG PO TABS: 100.0000 mg | ORAL_TABLET | Freq: Every day | ORAL | 0 refills | Status: AC

## 2017-04-25 MED ORDER — OSMOLITE 1.2 CAL PO LIQD
237.0000 mL | Freq: Four times a day (QID) | ORAL | Status: DC
  Filled 2017-04-25 (×5): qty 237

## 2017-04-25 NOTE — Care Management Note (Signed)
Case Management Note  Patient Details  Name: Alyssa Savage MRN: [id number] Date of Birth: 1930-02-06  Subjective/Objective:                    Action/Plan: Pt discharging back to [facility]. CM signing off.  Expected Discharge Date:  04/25/17               Expected Discharge Plan:  Skilled Nursing Facility  In-House Referral:  Clinical Social Work  Discharge planning Services     Post Acute Care Choice:    Choice offered to:     DME Arranged:    DME Agency:     HH Arranged:    HH Agency:     Status of Service:  In process, will continue to follow  If discussed at Long Length of Stay Meetings, dates discussed:    Additional Comments:  [name], RN 04/25/2017, 11:52 AM

## 2017-04-25 NOTE — Progress Notes (Signed)
Nutrition Follow-up  DOCUMENTATION CODES:   Severe malnutrition in context of chronic illness  INTERVENTION:  Change patient to plan she was on prior to admission: Osm 1.2 1 can QID, Pro-stat 30mL daily  Provides 1240 calories, 68 gm protein, 780mL free water Additional free water of 200mL 4 times daily (100mL before and after each bolus)  Provides total volume of 1580mL free water  NUTRITION DIAGNOSIS:   Severe Malnutrition related to chronic illness(CVA with residual deficits) as evidenced by severe fat depletion, severe muscle depletion(TF dependency via PEG tube). -ongoing  GOAL:   Patient will meet greater than or equal to 90% of their needs -met with TF  MONITOR:   TF tolerance, Labs, Skin, Weight trends, I & O's  ASSESSMENT:   84 y.o. Female with PMH significant recent stroke, severe dysphagia PEG tube, HTN, femoral neck fracture presents to ED with chief complaint of altered mental status.   Patient resting comfortably at bedside. Tolerating tubefeeds. Aphasiac - unable to provide history. D/C to SNF today, transition to bolus feeds.   Labs reviewed:  BUN 26  Medications reviewed and include:  Mg 2g IV  Diet Order:  Diet NPO time specified  EDUCATION NEEDS:   Not appropriate for education at this time  Skin:  Skin Assessment: Reviewed RN Assessment  Last BM:  04/23/2017  Height:   Ht Readings from Last 1 Encounters:  04/20/17 5' (1.524 m)    Weight:   Wt Readings from Last 1 Encounters:  04/25/17 117 lb 8.1 oz (53.3 kg)    Ideal Body Weight:  45.4 kg  BMI:  Body mass index is 22.95 kg/m.  Estimated Nutritional Needs:   Kcal:  1200-1400  Protein:  60-75 gm  Fluid:  >/= 1.5 L  William M. Ward, MS, RD LDN Inpatient Clinical Dietitian Pager [phone]  

## 2017-04-25 NOTE — Discharge Summary (Signed)
Physician Discharge Summary  Patient ID: Alyssa Savage MRN: [id number] DOB/AGE: 03-Oct-1930 84 y.o.  Admit date: 04/19/2017 Discharge date: 04/25/2017  Admission Diagnoses:  Discharge Diagnoses:  Principal Problem:   Acute CVA (cerebrovascular accident) [facility]) Active Problems:   Stroke ([place])   Protein-calorie malnutrition, severe   Acute encephalopathy   UTI (urinary tract infection)   Dysphagia   CKD (chronic kidney disease), stage III ([place])   Discharged Condition: stable  Hospital Course: Patient is 84 year old American female with past medical history significant for recent stroke with right-sided hemiparesis, severe dysphagia, status post PEG, left hip fracture.  Patient was admitted with acute encephalopathy from skilled nursing facility and 2-day history of aphasia prior to presentation.  CT head without acute changes.  MRI showed small acute infarct.  Neurology consulted.  Patient was admitted for further management.    Small acute infarct right paramedian pons MRI showed small acute infarct of the right paramedian pons  Echo with EF of 60-65% LDL 117, A1c 6.3 PT/OT/SLP Neurology consulted, and  aspirin was changed to Plavix.  Lipitor was continued. Follow-up with neurology outpatient in 4-6 weeks  Acute encephalopathy Resolved. Patient remains nonverbal but does follow simple commands, alert.  UTI Continuefluconazole.  PMH left MCA stroke with right-sided weakness Severe dysphagia, status post PEG tube. ContinuePEG tube feeds.   Chronic kidney disease stage III Stable  Elevated transaminases Trending down, possibly related to tube feeds Follow-up as an outpatient.  Recent left hip fracture, status post left hip hemiarthroplasty ContinueLovenox for DVT prophylaxis Continue PT/OT  Severe malnutrition Perdietitian  Consults: neurology  Significant Diagnostic Studies: radiology: MRI: Small acute infarct of the right paramedian pons.  Subacute  infarct of the left basal ganglia, unchanged in size.  Numerous scattered chronic microhemorrhages throughout the brain, unchanged. This could indicate chronic hypertensive microangiopathy, amyloid angiopathy or a combination of the two.  CT Head: Chronic ischemic microangiopathy without acute intracranial abnormality. Old left gangliocapsular lacunar infarct.  Discharge Exam: Blood pressure (!) 166/79, pulse 97, temperature 98.9 F (37.2 C), temperature source Oral, resp. rate 18, height 5' (1.524 m), weight 53.3 kg (117 lb 8.1 oz), SpO2 97 %.   Disposition: Discharge disposition: 03-[facility]       Discharge Instructions    Ambulatory referral to Neurology   Complete by:  As directed    Follow up with stroke clinic NP (Jessica Vanschaick or Cecille Rubin, if both not available, consider Dr. Antony Contras, Dr. Bess Harvest, or Dr. Sarina Ill) at [facility] in about 4 weeks.   Call MD for:   Complete by:  As directed    Please call MD if the symptoms worsen   Increase activity slowly   Complete by:  As directed      Allergies as of 04/25/2017   No Known Allergies     Medication List    STOP taking these medications   aspirin 325 MG tablet   methocarbamol 500 MG tablet Commonly known as:  ROBAXIN   senna-docusate 8.6-50 MG tablet Commonly known as:  Senokot-S     TAKE these medications   atorvastatin 40 MG tablet Commonly known as:  LIPITOR Place 1 tablet (40 mg total) into feeding tube daily at 6 PM.   carboxymethylcellulose 0.5 % Soln Commonly known as:  REFRESH PLUS Place 1 drop into both eyes every 8 (eight) hours as needed (dry eyes).   carvedilol 3.125 MG tablet Commonly known as:  COREG Place 1 tablet (3.125 mg total) into  feeding tube 2 (two) times daily with a meal.   clopidogrel 75 MG tablet Commonly known as:  PLAVIX Place 1 tablet (75 mg total) into feeding tube daily. Start taking on:  04/26/2017    clotrimazole 1 % vaginal cream Commonly known as:  GYNE-LOTRIMIN Place 1 Applicatorful vaginally at bedtime.   enoxaparin 30 MG/0.3ML injection Commonly known as:  LOVENOX Inject 0.3 mLs (30 mg total) into the skin daily.   feeding supplement (OSMOLITE 1.2 CAL) Liqd Place 1,000 mLs into feeding tube continuous. What changed:    how much to take  when to take this   feeding supplement (PRO-STAT SUGAR FREE 64) Liqd Place 30 mLs into feeding tube daily.   fluconazole 200 MG tablet Commonly known as:  DIFLUCAN Place 0.5 tablets (100 mg total) into feeding tube daily for 7 days.   free water Soln Place 200 mLs into feeding tube every 6 (six) hours. What changed:  when to take this   HYDROcodone-acetaminophen 5-325 MG tablet Commonly known as:  NORCO/VICODIN Take 1-2 tablets by mouth every 6 (six) hours as needed. What changed:    how to take this  reasons to take this   nitroGLYCERIN 0.4 MG SL tablet Commonly known as:  NITROSTAT Place 1 tablet (0.4 mg total) under the tongue every 5 (five) minutes as needed for chest pain.   nystatin 100000 UNIT/ML suspension Commonly known as:  MYCOSTATIN Take 5 mLs (500,000 Units total) by mouth 4 (four) times daily.      Follow-up Information    [name] Follow up in 4 week(s).   Specialty:  Neurology Why:  Stroke Clinic. Office will call with appt date and time Contact information: [address] [place] [phone]          Greater than 32 minutes spent discharging this patient.  SignedBonnell Public 04/25/2017, 11:47 AM

## 2017-04-25 NOTE — Progress Notes (Signed)
Pt discharge education and instructions completed. Reported called off to nurse Lanelle Bal at the facility. Pt discharge to [facility] transported pt off to disposition. PEG capped and intact. IV and telemetry removed. Pt transported off unit via stretcher with belongings to the side. Delia Heady RN

## 2017-04-25 NOTE — Progress Notes (Signed)
Discharge to: [facility] Anticipated discharge date: 04/25/17 Family notified: Yes, at bedside and by phone Cecilie Lowers) Transportation by: [facility]  Report #: [phone], Room 20B  [place] signing off.  Laveda Abbe LCSW [phone]

## 2017-05-05 ENCOUNTER — Ambulatory Visit (INDEPENDENT_AMBULATORY_CARE_PROVIDER_SITE_OTHER): Payer: Medicare Other

## 2017-05-05 ENCOUNTER — Other Ambulatory Visit: Payer: Self-pay | Admitting: Cardiology

## 2017-05-05 DIAGNOSIS — I639 Cerebral infarction, unspecified: Secondary | ICD-10-CM

## 2017-05-05 DIAGNOSIS — I4891 Unspecified atrial fibrillation: Secondary | ICD-10-CM

## 2017-05-27 ENCOUNTER — Encounter (HOSPITAL_COMMUNITY): Payer: Self-pay | Admitting: Emergency Medicine

## 2017-05-27 ENCOUNTER — Inpatient Hospital Stay (HOSPITAL_COMMUNITY)
Admission: EM | Admit: 2017-05-27 | Discharge: 2017-06-05 | DRG: 371 | Disposition: A | Discharge status: Skilled Nursing Facility | Payer: Medicare Other | Attending: Internal Medicine | Admitting: Internal Medicine

## 2017-05-27 ENCOUNTER — Other Ambulatory Visit: Payer: Self-pay

## 2017-05-27 DIAGNOSIS — R197 Diarrhea, unspecified: Secondary | ICD-10-CM | POA: Diagnosis not present

## 2017-05-27 DIAGNOSIS — K219 Gastro-esophageal reflux disease without esophagitis: Secondary | ICD-10-CM | POA: Diagnosis present

## 2017-05-27 DIAGNOSIS — K5641 Fecal impaction: Secondary | ICD-10-CM | POA: Diagnosis present

## 2017-05-27 DIAGNOSIS — A0472 Enterocolitis due to Clostridium difficile, not specified as recurrent: Principal | ICD-10-CM

## 2017-05-27 DIAGNOSIS — I69351 Hemiplegia and hemiparesis following cerebral infarction affecting right dominant side: Secondary | ICD-10-CM

## 2017-05-27 DIAGNOSIS — I69320 Aphasia following cerebral infarction: Secondary | ICD-10-CM

## 2017-05-27 DIAGNOSIS — I129 Hypertensive chronic kidney disease with stage 1 through stage 4 chronic kidney disease, or unspecified chronic kidney disease: Secondary | ICD-10-CM | POA: Diagnosis present

## 2017-05-27 DIAGNOSIS — D62 Acute posthemorrhagic anemia: Secondary | ICD-10-CM | POA: Diagnosis present

## 2017-05-27 DIAGNOSIS — K625 Hemorrhage of anus and rectum: Secondary | ICD-10-CM

## 2017-05-27 DIAGNOSIS — Z96642 Presence of left artificial hip joint: Secondary | ICD-10-CM | POA: Diagnosis present

## 2017-05-27 DIAGNOSIS — D259 Leiomyoma of uterus, unspecified: Secondary | ICD-10-CM

## 2017-05-27 DIAGNOSIS — D638 Anemia in other chronic diseases classified elsewhere: Secondary | ICD-10-CM | POA: Diagnosis present

## 2017-05-27 DIAGNOSIS — E876 Hypokalemia: Secondary | ICD-10-CM | POA: Diagnosis present

## 2017-05-27 DIAGNOSIS — R9431 Abnormal electrocardiogram [ECG] [EKG]: Secondary | ICD-10-CM | POA: Diagnosis present

## 2017-05-27 DIAGNOSIS — R112 Nausea with vomiting, unspecified: Secondary | ICD-10-CM | POA: Diagnosis present

## 2017-05-27 DIAGNOSIS — K5900 Constipation, unspecified: Secondary | ICD-10-CM

## 2017-05-27 DIAGNOSIS — I639 Cerebral infarction, unspecified: Secondary | ICD-10-CM | POA: Diagnosis present

## 2017-05-27 DIAGNOSIS — E43 Unspecified severe protein-calorie malnutrition: Secondary | ICD-10-CM | POA: Diagnosis present

## 2017-05-27 DIAGNOSIS — N183 Chronic kidney disease, stage 3 unspecified: Secondary | ICD-10-CM | POA: Diagnosis present

## 2017-05-27 DIAGNOSIS — R319 Hematuria, unspecified: Secondary | ICD-10-CM

## 2017-05-27 DIAGNOSIS — R0989 Other specified symptoms and signs involving the circulatory and respiratory systems: Secondary | ICD-10-CM

## 2017-05-27 DIAGNOSIS — R1310 Dysphagia, unspecified: Secondary | ICD-10-CM

## 2017-05-27 DIAGNOSIS — E785 Hyperlipidemia, unspecified: Secondary | ICD-10-CM | POA: Diagnosis present

## 2017-05-27 DIAGNOSIS — E860 Dehydration: Secondary | ICD-10-CM | POA: Diagnosis present

## 2017-05-27 DIAGNOSIS — Z931 Gastrostomy status: Secondary | ICD-10-CM

## 2017-05-27 DIAGNOSIS — N390 Urinary tract infection, site not specified: Secondary | ICD-10-CM | POA: Diagnosis present

## 2017-05-27 MED ORDER — SODIUM CHLORIDE 0.9 % IV BOLUS
500.0000 mL | Freq: Once | INTRAVENOUS | Status: AC
  Administered 2017-05-28: 500 mL via INTRAVENOUS

## 2017-05-27 MED ORDER — ONDANSETRON HCL 4 MG/2ML IJ SOLN
4.0000 mg | Freq: Once | INTRAMUSCULAR | Status: AC
  Administered 2017-05-28: 4 mg via INTRAVENOUS
  Filled 2017-05-27: qty 2

## 2017-05-27 NOTE — ED Triage Notes (Addendum)
Pt arrived [facility] from [facility]. Reports dark brown emesis. Pt's daughter reports diarrhea the past 4 days. Pt also reports pain in her throat rated at a 6/10.

## 2017-05-28 ENCOUNTER — Emergency Department (HOSPITAL_COMMUNITY): Payer: Medicare Other

## 2017-05-28 ENCOUNTER — Encounter (HOSPITAL_COMMUNITY): Payer: Self-pay | Admitting: Physician Assistant

## 2017-05-28 DIAGNOSIS — E46 Unspecified protein-calorie malnutrition: Secondary | ICD-10-CM | POA: Diagnosis not present

## 2017-05-28 DIAGNOSIS — N3000 Acute cystitis without hematuria: Secondary | ICD-10-CM | POA: Diagnosis not present

## 2017-05-28 DIAGNOSIS — I6302 Cerebral infarction due to thrombosis of basilar artery: Secondary | ICD-10-CM | POA: Diagnosis not present

## 2017-05-28 DIAGNOSIS — K219 Gastro-esophageal reflux disease without esophagitis: Secondary | ICD-10-CM | POA: Diagnosis not present

## 2017-05-28 DIAGNOSIS — N183 Chronic kidney disease, stage 3 (moderate): Secondary | ICD-10-CM | POA: Diagnosis present

## 2017-05-28 DIAGNOSIS — R112 Nausea with vomiting, unspecified: Secondary | ICD-10-CM | POA: Diagnosis not present

## 2017-05-28 DIAGNOSIS — Z931 Gastrostomy status: Secondary | ICD-10-CM | POA: Diagnosis not present

## 2017-05-28 DIAGNOSIS — K921 Melena: Secondary | ICD-10-CM | POA: Diagnosis not present

## 2017-05-28 DIAGNOSIS — K5641 Fecal impaction: Secondary | ICD-10-CM | POA: Diagnosis present

## 2017-05-28 DIAGNOSIS — E43 Unspecified severe protein-calorie malnutrition: Secondary | ICD-10-CM | POA: Diagnosis present

## 2017-05-28 DIAGNOSIS — R9431 Abnormal electrocardiogram [ECG] [EKG]: Secondary | ICD-10-CM | POA: Diagnosis not present

## 2017-05-28 DIAGNOSIS — R1310 Dysphagia, unspecified: Secondary | ICD-10-CM | POA: Diagnosis present

## 2017-05-28 DIAGNOSIS — Z96642 Presence of left artificial hip joint: Secondary | ICD-10-CM | POA: Diagnosis present

## 2017-05-28 DIAGNOSIS — I69320 Aphasia following cerebral infarction: Secondary | ICD-10-CM | POA: Diagnosis not present

## 2017-05-28 DIAGNOSIS — N390 Urinary tract infection, site not specified: Secondary | ICD-10-CM | POA: Diagnosis not present

## 2017-05-28 DIAGNOSIS — D638 Anemia in other chronic diseases classified elsewhere: Secondary | ICD-10-CM | POA: Diagnosis present

## 2017-05-28 DIAGNOSIS — I129 Hypertensive chronic kidney disease with stage 1 through stage 4 chronic kidney disease, or unspecified chronic kidney disease: Secondary | ICD-10-CM | POA: Diagnosis present

## 2017-05-28 DIAGNOSIS — E860 Dehydration: Secondary | ICD-10-CM | POA: Diagnosis present

## 2017-05-28 DIAGNOSIS — K5900 Constipation, unspecified: Secondary | ICD-10-CM

## 2017-05-28 DIAGNOSIS — A0472 Enterocolitis due to Clostridium difficile, not specified as recurrent: Secondary | ICD-10-CM | POA: Diagnosis present

## 2017-05-28 DIAGNOSIS — K625 Hemorrhage of anus and rectum: Secondary | ICD-10-CM | POA: Diagnosis not present

## 2017-05-28 DIAGNOSIS — D62 Acute posthemorrhagic anemia: Secondary | ICD-10-CM | POA: Diagnosis present

## 2017-05-28 DIAGNOSIS — R197 Diarrhea, unspecified: Secondary | ICD-10-CM | POA: Diagnosis present

## 2017-05-28 DIAGNOSIS — I69351 Hemiplegia and hemiparesis following cerebral infarction affecting right dominant side: Secondary | ICD-10-CM | POA: Diagnosis not present

## 2017-05-28 DIAGNOSIS — E876 Hypokalemia: Secondary | ICD-10-CM | POA: Diagnosis present

## 2017-05-28 DIAGNOSIS — R710 Precipitous drop in hematocrit: Secondary | ICD-10-CM | POA: Diagnosis not present

## 2017-05-28 DIAGNOSIS — R319 Hematuria, unspecified: Secondary | ICD-10-CM | POA: Diagnosis not present

## 2017-05-28 DIAGNOSIS — D259 Leiomyoma of uterus, unspecified: Secondary | ICD-10-CM | POA: Diagnosis present

## 2017-05-28 DIAGNOSIS — E785 Hyperlipidemia, unspecified: Secondary | ICD-10-CM | POA: Diagnosis present

## 2017-05-28 LAB — COMPREHENSIVE METABOLIC PANEL
ALBUMIN: 2.4 g/dL — AB (ref 3.5–5.0)
ALT: 55 U/L — ABNORMAL HIGH (ref 14–54)
AST: 37 U/L (ref 15–41)
Alkaline Phosphatase: 87 U/L (ref 38–126)
Anion gap: 9 (ref 5–15)
BUN: 36 mg/dL — AB (ref 6–20)
CHLORIDE: 106 mmol/L (ref 101–111)
CO2: 25 mmol/L (ref 22–32)
Calcium: 8.8 mg/dL — ABNORMAL LOW (ref 8.9–10.3)
Creatinine, Ser: 0.94 mg/dL (ref 0.44–1.00)
GFR calc Af Amer: >60 mL/min (ref >60)
GFR calc non Af Amer: 54 mL/min — ABNORMAL LOW (ref >60)
GLUCOSE: 134 mg/dL — AB (ref 65–99)
Potassium: 3.4 mmol/L — ABNORMAL LOW (ref 3.5–5.1)
Sodium: 140 mmol/L (ref 135–145)
Total Bilirubin: 0.4 mg/dL (ref 0.3–1.2)
Total Protein: 6.5 g/dL (ref 6.5–8.1)

## 2017-05-28 LAB — CBC WITH DIFFERENTIAL/PLATELET
BASOS ABS: 0 10*3/uL (ref 0.0–0.1)
BASOS PCT: 0 %
Eosinophils Absolute: 0.2 10*3/uL (ref 0.0–0.7)
Eosinophils Relative: 2 %
HCT: 32.4 % — ABNORMAL LOW (ref 36.0–46.0)
Hemoglobin: 10.1 g/dL — ABNORMAL LOW (ref 12.0–15.0)
Lymphocytes Relative: 12 %
Lymphs Abs: 1.6 10*3/uL (ref 0.7–4.0)
MCH: 27.5 pg (ref 26.0–34.0)
MCHC: 31.2 g/dL (ref 30.0–36.0)
MCV: 88.3 fL (ref 78.0–100.0)
MONO ABS: 0.8 10*3/uL (ref 0.1–1.0)
Monocytes Relative: 6 %
NEUTROS ABS: 11.2 10*3/uL — AB (ref 1.7–7.7)
Neutrophils Relative %: 80 %
PLATELETS: 377 10*3/uL (ref 150–400)
RBC: 3.67 MIL/uL — AB (ref 3.87–5.11)
RDW: 16.6 % — AB (ref 11.5–15.5)
WBC: 13.9 10*3/uL — AB (ref 4.0–10.5)

## 2017-05-28 LAB — MRSA PCR SCREENING: MRSA by PCR: NEGATIVE

## 2017-05-28 LAB — URINALYSIS, ROUTINE W REFLEX MICROSCOPIC
BILIRUBIN URINE: NEGATIVE
Glucose, UA: NEGATIVE mg/dL
HGB URINE DIPSTICK: NEGATIVE
Ketones, ur: NEGATIVE mg/dL
NITRITE: NEGATIVE
PH: 5 (ref 5.0–8.0)
Protein, ur: NEGATIVE mg/dL
SPECIFIC GRAVITY, URINE: 1.016 (ref 1.005–1.030)

## 2017-05-28 LAB — LIPASE, BLOOD: Lipase: 72 U/L — ABNORMAL HIGH (ref 11–51)

## 2017-05-28 LAB — TROPONIN I: Troponin I: 0.04 ng/mL (ref <0.03)

## 2017-05-28 LAB — I-STAT CG4 LACTIC ACID, ED: Lactic Acid, Venous: 1.4 mmol/L (ref 0.5–1.9)

## 2017-05-28 LAB — PROTIME-INR
INR: 1.12
Prothrombin Time: 14.3 seconds (ref 11.4–15.2)

## 2017-05-28 MED ORDER — SODIUM CHLORIDE 0.9 % IV SOLN
1.0000 g | INTRAVENOUS | Status: DC
  Administered 2017-05-28 – 2017-05-30 (×3): 1 g via INTRAVENOUS
  Filled 2017-05-28 (×3): qty 10

## 2017-05-28 MED ORDER — ACETAMINOPHEN 325 MG PO TABS
650.0000 mg | ORAL_TABLET | Freq: Four times a day (QID) | ORAL | Status: DC | PRN
  Administered 2017-06-01 – 2017-06-02 (×2): 650 mg via ORAL
  Filled 2017-05-28 (×2): qty 2

## 2017-05-28 MED ORDER — POLYVINYL ALCOHOL 1.4 % OP SOLN
1.0000 [drp] | Freq: Three times a day (TID) | OPHTHALMIC | Status: DC | PRN
  Filled 2017-05-28: qty 15

## 2017-05-28 MED ORDER — MORPHINE SULFATE (PF) 4 MG/ML IV SOLN
1.0000 mg | INTRAVENOUS | Status: DC | PRN
  Administered 2017-05-28 – 2017-06-01 (×7): 1 mg via INTRAVENOUS
  Filled 2017-05-28 (×8): qty 1

## 2017-05-28 MED ORDER — PRO-STAT SUGAR FREE PO LIQD
30.0000 mL | Freq: Every day | ORAL | Status: DC
  Administered 2017-05-28 – 2017-06-05 (×8): 30 mL
  Filled 2017-05-28 (×8): qty 30

## 2017-05-28 MED ORDER — HYDROXYZINE HCL 25 MG PO TABS
25.0000 mg | ORAL_TABLET | Freq: Once | ORAL | Status: AC
  Administered 2017-05-28: 25 mg via ORAL
  Filled 2017-05-28: qty 1

## 2017-05-28 MED ORDER — FREE WATER
200.0000 mL | Freq: Four times a day (QID) | Status: DC
  Administered 2017-05-28 – 2017-06-05 (×31): 200 mL

## 2017-05-28 MED ORDER — BISACODYL 10 MG RE SUPP
10.0000 mg | Freq: Every day | RECTAL | Status: DC | PRN
Start: 2017-05-28 — End: 2017-06-05

## 2017-05-28 MED ORDER — CARVEDILOL 3.125 MG PO TABS
3.1250 mg | ORAL_TABLET | Freq: Two times a day (BID) | ORAL | Status: DC
  Administered 2017-05-28 – 2017-06-05 (×16): 3.125 mg
  Filled 2017-05-28 (×17): qty 1

## 2017-05-28 MED ORDER — ORAL CARE MOUTH RINSE
15.0000 mL | Freq: Two times a day (BID) | OROMUCOSAL | Status: DC
  Administered 2017-05-28 – 2017-06-05 (×16): 15 mL via OROMUCOSAL

## 2017-05-28 MED ORDER — OSMOLITE 1.2 CAL PO LIQD
237.0000 mL | Freq: Four times a day (QID) | ORAL | Status: DC
  Administered 2017-05-28 – 2017-06-05 (×30): 237 mL
  Filled 2017-05-28 (×42): qty 237

## 2017-05-28 MED ORDER — CLOTRIMAZOLE 1 % VA CREA: 1.0000 | TOPICAL_CREAM | Freq: Every day | VAGINAL | Status: DC

## 2017-05-28 MED ORDER — SODIUM CHLORIDE 0.9 % IV SOLN
INTRAVENOUS | Status: DC
  Administered 2017-05-28: 09:00:00 via INTRAVENOUS
  Administered 2017-05-28: 1 mL via INTRAVENOUS

## 2017-05-28 MED ORDER — SENNOSIDES-DOCUSATE SODIUM 8.6-50 MG PO TABS: 1.0000 | ORAL_TABLET | Freq: Every evening | ORAL | Status: DC | PRN

## 2017-05-28 MED ORDER — NYSTATIN 100000 UNIT/ML MT SUSP: 5.0000 mL | Freq: Four times a day (QID) | OROMUCOSAL | Status: DC

## 2017-05-28 MED ORDER — ACETAMINOPHEN 650 MG RE SUPP: 650.0000 mg | Freq: Four times a day (QID) | RECTAL | Status: DC | PRN

## 2017-05-28 MED ORDER — ENOXAPARIN SODIUM 30 MG/0.3ML ~~LOC~~ SOLN
30.0000 mg | SUBCUTANEOUS | Status: DC
  Administered 2017-05-28: 30 mg via SUBCUTANEOUS
  Filled 2017-05-28 (×2): qty 0.3

## 2017-05-28 MED ORDER — MORPHINE SULFATE (PF) 4 MG/ML IV SOLN
2.0000 mg | Freq: Once | INTRAVENOUS | Status: AC
  Administered 2017-05-28: 2 mg via INTRAVENOUS
  Filled 2017-05-28: qty 1

## 2017-05-28 MED ORDER — ONDANSETRON HCL 4 MG PO TABS: 4.0000 mg | ORAL_TABLET | Freq: Four times a day (QID) | ORAL | Status: DC | PRN

## 2017-05-28 MED ORDER — POTASSIUM CHLORIDE 10 MEQ/100ML IV SOLN
10.0000 meq | Freq: Once | INTRAVENOUS | Status: AC
  Administered 2017-05-28: 10 meq via INTRAVENOUS
  Filled 2017-05-28: qty 100

## 2017-05-28 MED ORDER — ATORVASTATIN CALCIUM 40 MG PO TABS
40.0000 mg | ORAL_TABLET | Freq: Every day | ORAL | Status: DC
  Administered 2017-05-28 – 2017-06-04 (×8): 40 mg
  Filled 2017-05-28 (×9): qty 1

## 2017-05-28 MED ORDER — CLOPIDOGREL BISULFATE 75 MG PO TABS
75.0000 mg | ORAL_TABLET | Freq: Every day | ORAL | Status: DC
  Administered 2017-05-28 – 2017-06-05 (×8): 75 mg
  Filled 2017-05-28 (×8): qty 1

## 2017-05-28 MED ORDER — ONDANSETRON HCL 4 MG/2ML IJ SOLN: 4.0000 mg | Freq: Four times a day (QID) | INTRAMUSCULAR | Status: DC | PRN

## 2017-05-28 NOTE — ED Notes (Signed)
Pt returned from xray

## 2017-05-28 NOTE — Progress Notes (Signed)
Pt received from [facility], transferred to [address].

## 2017-05-28 NOTE — ED Notes (Signed)
EDP at bedside  

## 2017-05-28 NOTE — Progress Notes (Signed)
   INFECTIOUS DISEASE ATTENDING  NOTE:   I was called re this patient who has candida in urine   It is NOT at all clear to me from chart review or conversations with ER MD that patient has had ANY symptoms  Asymptomatic funguria SHOULD NOT BE TREATED  If she has a catheter and there later arises concerns for systemic infection with fevers then that catheter should be removed  Please call with questions. I will DC her antifungal therapy.

## 2017-05-28 NOTE — ED Notes (Signed)
Pt arrived with subcutaneous catheter in left thigh with transparent dressing dated 05/23/17. Dressing C,D,I.

## 2017-05-28 NOTE — ED Provider Notes (Signed)
[facility] EMERGENCY DEPARTMENT Provider Note   CSN: [id number] Arrival date & time: 05/27/17  2244     History   Chief Complaint Chief Complaint  Patient presents with  . Emesis    HPI Alyssa Savage is a 84 y.o. female.  Patient presents to the emergency department for evaluation of diarrhea and vomiting.  Patient has had several days of profuse, watery diarrhea.  No rectal bleeding or melena.  Tonight she became nauseated and vomited one time.  She has had cough and chest congestion for the last couple days as well.  No documented fevers.  Patient denies any abdominal pain.  She feels very weak and fatigued.     Past Medical History:  Diagnosis Date  . Femoral neck fracture ([place]) 03/2017   left   . GERD (gastroesophageal reflux disease)   . Hypertensive emergency 03/2017  . Hypokalemia 03/2017  . Stroke [facility])     Patient Active Problem List   Diagnosis Date Noted  . Nausea and vomiting 05/28/2017  . Acute CVA (cerebrovascular accident) ([place]) 04/22/2017  . CKD (chronic kidney disease), stage III ([place]) 04/20/2017  . Acute encephalopathy 04/19/2017  . UTI (urinary tract infection) 04/19/2017  . Dysphagia 04/19/2017  . Displaced fracture of left femoral neck ([place]) 03/25/2017  . Protein-calorie malnutrition, severe 03/24/2017  . Fall 03/21/2017  . Fracture of femoral neck, left, closed ([place]) 03/21/2017  . AKI (acute kidney injury) ([place]) 03/21/2017  . Hypertensive emergency 03/21/2017  . Elevated troponin 03/21/2017  . Hypokalemia 03/21/2017  . Stroke ([place]) 03/21/2017  . GERD (gastroesophageal reflux disease)   . Closed fracture of left hip ([place])   . Prolonged Q-T interval on ECG     Past Surgical History:  Procedure Laterality Date  . ANTERIOR APPROACH HEMI HIP ARTHROPLASTY Left 03/25/2017   Procedure: ANTERIOR APPROACH HEMI HIP ARTHROPLASTY;  Surgeon: Rod Can, MD;  Location: [facility];  Service: Orthopedics;  Laterality: Left;  . IR  GASTROSTOMY TUBE MOD SED  03/31/2017  . NO PAST SURGERIES       OB History   None      Home Medications    Prior to Admission medications   Medication Sig Start Date End Date Taking? Authorizing Provider  Amino Acids-Protein Hydrolys (FEEDING SUPPLEMENT, PRO-STAT SUGAR FREE 64,) LIQD Place 30 mLs into feeding tube daily.    [provider]  atorvastatin (LIPITOR) 40 MG tablet Place 1 tablet (40 mg total) into feeding tube daily at 6 PM. 04/02/17   Aline August, MD  carboxymethylcellulose (REFRESH PLUS) 0.5 % SOLN Place 1 drop into both eyes every 8 (eight) hours as needed (dry eyes).    [provider]  carvedilol (COREG) 3.125 MG tablet Place 1 tablet (3.125 mg total) into feeding tube 2 (two) times daily with a meal. 04/02/17   Aline August, MD  clopidogrel (PLAVIX) 75 MG tablet Place 1 tablet (75 mg total) into feeding tube daily. 04/26/17   Dana Allan I, MD  clotrimazole (GYNE-LOTRIMIN) 1 % vaginal cream Place 1 Applicatorful vaginally at bedtime. 04/25/17   Dana Allan I, MD  enoxaparin (LOVENOX) 30 MG/0.3ML injection Inject 0.3 mLs (30 mg total) into the skin daily. 03/28/17   Swinteck, Aaron Edelman, MD  HYDROcodone-acetaminophen (NORCO/VICODIN) 5-325 MG tablet Take 1-2 tablets by mouth every 6 (six) hours as needed. Patient taking differently: Place 1-2 tablets into feeding tube every 6 (six) hours as needed (pain).  03/28/17   Swinteck, Aaron Edelman, MD  nitroGLYCERIN (NITROSTAT) 0.4 MG  SL tablet Place 1 tablet (0.4 mg total) under the tongue every 5 (five) minutes as needed for chest pain. 04/02/17   Aline August, MD  Nutritional Supplements (FEEDING SUPPLEMENT, OSMOLITE 1.2 CAL,) LIQD Place 1,000 mLs into feeding tube continuous. Patient taking differently: Place 237 mLs into feeding tube 4 (four) times daily.  04/02/17   Aline August, MD  nystatin (MYCOSTATIN) 100000 UNIT/ML suspension Take 5 mLs (500,000 Units total) by mouth 4 (four) times daily. 04/25/17   Bonnell Public, MD  Water For Irrigation, Sterile (FREE WATER) SOLN Place 200 mLs into feeding tube every 6 (six) hours. Patient taking differently: Place 200 mLs into feeding tube 4 (four) times daily.  04/02/17   Aline August, MD    Family History Family History  Problem Relation Age of Onset  . CAD Mother   . Heart disease Mother     Social History Social History   Tobacco Use  . Smoking status: Never Smoker  . Smokeless tobacco: Never Used  Substance Use Topics  . Alcohol use: No    Frequency: Never  . Drug use: No     Allergies   Patient has no known allergies.   Review of Systems Review of Systems  Constitutional: Positive for fatigue. Negative for fever.  Gastrointestinal: Positive for abdominal distention, diarrhea, nausea and vomiting. Negative for abdominal pain.  All other systems reviewed and are negative.    Physical Exam Updated Vital Signs BP (!) 144/92   Pulse 94   Temp 98.1 F (36.7 C) (Oral)   Resp (!) 23   Ht 5' (1.524 m)   Wt 53.1 kg (117 lb)   SpO2 100%   BMI 22.85 kg/m   Physical Exam  Constitutional: She is oriented to [place], place, and time. She appears well-developed and well-nourished. No distress.  HENT:  Head: Normocephalic and atraumatic.  Right Ear: Hearing normal.  Left Ear: Hearing normal.  Nose: Nose normal.  Mouth/Throat: Oropharynx is clear and moist and mucous membranes are normal.  Eyes: Pupils are equal, round, and reactive to light. Conjunctivae and EOM are normal.  Neck: Normal range of motion. Neck supple.  Cardiovascular: Regular rhythm, S1 normal and S2 normal. Exam reveals no gallop and no friction rub.  No murmur heard. Pulmonary/Chest: Effort normal and breath sounds normal. No respiratory distress. She exhibits no tenderness.  Abdominal: Soft. Normal appearance and bowel sounds are normal. There is no hepatosplenomegaly. There is no tenderness. There is no rebound, no guarding, no tenderness at McBurney's  point and negative Murphy's sign. No hernia.  Musculoskeletal: Normal range of motion.  Neurological: She is alert and oriented to [place], place, and time. She has normal strength. No cranial nerve deficit or sensory deficit. Coordination normal. GCS eye subscore is 4. GCS verbal subscore is 5. GCS motor subscore is 6.  Skin: Skin is warm, dry and intact. No rash noted. No cyanosis.  Psychiatric: She has a normal mood and affect. Her speech is normal and behavior is normal. Thought content normal.  Nursing note and vitals reviewed.    ED Treatments / Results  Labs (all labs ordered are listed, but only abnormal results are displayed) Labs Reviewed  CBC WITH DIFFERENTIAL/PLATELET - Abnormal; Notable for the following components:      Result Value   WBC 13.9 (*)    RBC 3.67 (*)    Hemoglobin 10.1 (*)    HCT 32.4 (*)    RDW 16.6 (*)    Neutro Abs 11.2 (*)  All other components within normal limits  COMPREHENSIVE METABOLIC PANEL - Abnormal; Notable for the following components:   Potassium 3.4 (*)    Glucose, Bld 134 (*)    BUN 36 (*)    Calcium 8.8 (*)    Albumin 2.4 (*)    ALT 55 (*)    GFR calc non Af Amer 54 (*)    All other components within normal limits  LIPASE, BLOOD - Abnormal; Notable for the following components:   Lipase 72 (*)    All other components within normal limits  URINALYSIS, ROUTINE W REFLEX MICROSCOPIC - Abnormal; Notable for the following components:   APPearance HAZY (*)    Leukocytes, UA LARGE (*)    WBC, UA >50 (*)    Bacteria, UA FEW (*)    All other components within normal limits  TROPONIN I - Abnormal; Notable for the following components:   Troponin I 0.04 (*)    All other components within normal limits  URINE CULTURE  PROTIME-INR  TROPONIN I  I-STAT CG4 LACTIC ACID, ED    EKG EKG Interpretation  Date/Time:  Saturday May 28 2017 00:14:38 EDT Ventricular Rate:  101 PR Interval:    QRS Duration: 89 QT Interval:  362 QTC  Calculation: 470 R Axis:   -36 Text Interpretation:  Sinus tachycardia with irregular rate Left axis deviation Borderline repolarization abnormality Confirmed by Orpah Greek [phone]) on 05/28/2017 12:21:16 AM   Radiology US Abdomen Complete  Result Date: 05/28/2017 CLINICAL DATA:  Initial evaluation for acute vomiting for 4 days. Gastric tube in place. EXAM: ABDOMEN ULTRASOUND COMPLETE COMPARISON:  Prior CT from 03/29/2017. FINDINGS: Gallbladder: Gallbladder sludge without cholelithiasis. Gallbladder wall measure within normal limits of 1.5 mm. No free pericholecystic fluid. No sonographic Murphy sign elicited on exam. Common bile duct: Diameter: 6.8 mm Liver: No focal lesion identified. Within normal limits in parenchymal echogenicity. Portal vein is patent on color Doppler imaging with normal direction of blood flow towards the liver. IVC: No abnormality visualized. Pancreas: Not visualized. Spleen: Size and appearance within normal limits. Right Kidney: Length: 9.1 cm. Increased echogenicity within the renal parenchyma, suggesting medical renal disease. No mass or hydronephrosis visualized. 4.3 x 4.3 x 4.8 cm and 1.6 x 1.4 x 1.4 cm cyst noted. Left Kidney: Not visualized. Abdominal aorta: No aneurysm visualized. Other findings: None. IMPRESSION: 1. Gallbladder sludge without cholelithiasis or evidence for acute cholecystitis. No biliary dilatation. 2. Increased echogenicity within the right renal parenchyma, suggesting medical renal disease. No hydronephrosis. Left kidney not visualized due to technical limitations. 3. Right renal cysts as above. Electronically Signed   By: Jeannine Boga M.D.   On: 05/28/2017 03:33   Dg Abd Acute W/chest  Result Date: 05/28/2017 CLINICAL DATA:  Vomiting and diarrhea. EXAM: DG ABDOMEN ACUTE W/ 1V CHEST COMPARISON:  Chest x-ray dated April 19, 2017. Abdominal x-ray dated April 20, 2017. FINDINGS: The patient is rotated to the left on the chest x-ray.  Stable mild cardiomegaly. Normal pulmonary vascularity. No focal consolidation, pleural effusion, or pneumothorax. No acute osseous abnormality. Gastrostomy tube remains in place. No dilated small bowel loops or air-fluid levels. Large colonic stool burden with large stool ball in the rectum. No pneumoperitoneum. Multiple large calcified fibroids are again noted within the pelvis. IMPRESSION: 1. Large colonic stool burden with large stool ball in the rectum, concerning for constipation with fecal impaction. 2. No evidence of bowel obstruction. 3.  No active cardiopulmonary disease. Electronically Signed   By: Huntley Dec  Derry M.D.   On: 05/28/2017 07:27    Procedures Procedures (including critical care time)  Medications Ordered in ED Medications  enoxaparin (LOVENOX) injection 30 mg (has no administration in time range)  atorvastatin (LIPITOR) tablet 40 mg (has no administration in time range)  carvedilol (COREG) tablet 3.125 mg (has no administration in time range)  free water 200 mL (has no administration in time range)  feeding supplement (PRO-STAT SUGAR FREE 64) liquid 30 mL (has no administration in time range)  carboxymethylcellulose (REFRESH PLUS) 0.5 % ophthalmic solution 1 drop (has no administration in time range)  clotrimazole (GYNE-LOTRIMIN) vaginal cream 1 Applicatorful (has no administration in time range)  clopidogrel (PLAVIX) tablet 75 mg (has no administration in time range)  nystatin (MYCOSTATIN) 100000 UNIT/ML suspension 500,000 Units (has no administration in time range)  feeding supplement (OSMOLITE 1.2 CAL) liquid 237 mL (has no administration in time range)  0.9 %  sodium chloride infusion (has no administration in time range)  acetaminophen (TYLENOL) tablet 650 mg (has no administration in time range)    Or  acetaminophen (TYLENOL) suppository 650 mg (has no administration in time range)  senna-docusate (Senokot-S) tablet 1 tablet (has no administration in time range)    bisacodyl (DULCOLAX) suppository 10 mg (has no administration in time range)  ondansetron (ZOFRAN) tablet 4 mg (has no administration in time range)    Or  ondansetron (ZOFRAN) injection 4 mg (has no administration in time range)  morphine 4 MG/ML injection 1 mg (has no administration in time range)  sodium chloride 0.9 % bolus 500 mL (0 mLs Intravenous Stopped 05/28/17 0210)  ondansetron (ZOFRAN) injection 4 mg (4 mg Intravenous Given 05/28/17 0030)  morphine 4 MG/ML injection 2 mg (2 mg Intravenous Given 05/28/17 0428)  hydrOXYzine (ATARAX/VISTARIL) tablet 25 mg (25 mg Oral Given 05/28/17 0430)     Initial Impression / Assessment and Plan / ED Course  I have reviewed the triage vital signs and the nursing notes.  Pertinent labs & imaging results that were available during my care of the patient were reviewed by me and considered in my medical decision making (see chart for details).     Patient presents to the emergency department for evaluation of multiple problems.  Patient has had nausea and vomiting and has been having persistent watery brown diarrhea.  She has had abdominal distention but denies abdominal pain.  Recent noted to have an elevated lipase.  Ultrasound did not show any acute abnormality.  X-ray shows significant stool retention and fecal impaction.  I was able to manually disimpact a large amount of stool.  Urinalysis suggestive of infection.  There is yeast present.  When you look at her most previous hospitalization, she was treated for yeast urinary tract infection with Diflucan.  It appears that this is persistent and possibly resistant.  Patient will be hospitalized for further management.  Final Clinical Impressions(s) / ED Diagnoses   Final diagnoses:  Constipation, unspecified constipation type  Urinary tract infection with hematuria, site unspecified    ED Discharge Orders    None       Orpah Greek, MD 05/28/17 [phone]

## 2017-05-28 NOTE — H&P (Addendum)
History and Physical    Alyssa Savage ERX:[id number] DOB: [birth year] DOA: 05/27/2017   PCP: Patient, No Pcp Per   Patient coming from: Nursing Home    Chief Complaint: Nausea, vomiting, abdominal distention  HPI: Alyssa Savage is a 84 y.o. female with medical history significant for recent stroke, with residual right-sided deficits, severe dysphagia with PEG in place, history of left hip fracture in February 2019, hypertension among other issues listed below, presenting today from skilled nursing facility with nausea, vomiting, abdominal distention with intermittent watery diarrhea and constipation.  The patient is unable to speak due to recent stroke, and at the facility was less interactive (however here at the emergency department is more alert and able to interact).  In review, the patient had been diagnosed with a UTI during last admission, cultures positive for fungus, and had been placed on Diflucan per tube.  Today, her UA continues to be positive for fungus in urine, with cultures pending.  Thus, she is being admitted for the treatment of UTI, as well as management of her GI symptoms.  No documented fevers.  No apparent shortness of breath or chest pain.  She appears very weak and fatigued.  No apparent bleeding issues.  ED Course:  BP (!) 144/92   Pulse 94   Temp 98.1 F (36.7 C) (Oral)   Resp (!) 23   Ht 5' (1.524 m)   Wt 53.1 kg (117 lb)   SpO2 100%   BMI 22.85 kg/m   White count is elevated at 13.9. X-ray showed significant stool retention and fecal impaction, she was manually disimpacted, with a large amount of stools. Urine analysis was suggestive of infection as well, with significant yeast present. Hemoglobin 10.1. Potassium 3.4. Lipase 72. Lactic acid normal at Troponin 0.04. EKG with sinus tachycardia, no acute findings. Patient received IV antiemetics with Zofran, morphine for pain, and she also received a thorax PRN for itching   Review of Systems:  As  per HPI otherwise all other systems reviewed and are negative  Past Medical History:  Diagnosis Date  . Femoral neck fracture ([place]) 03/2017   left   . GERD (gastroesophageal reflux disease)   . Hypertensive emergency 03/2017  . Hypokalemia 03/2017  . Stroke [facility])     Past Surgical History:  Procedure Laterality Date  . ANTERIOR APPROACH HEMI HIP ARTHROPLASTY Left 03/25/2017   Procedure: ANTERIOR APPROACH HEMI HIP ARTHROPLASTY;  Surgeon: Rod Can, MD;  Location: [place];  Service: Orthopedics;  Laterality: Left;  . IR GASTROSTOMY TUBE MOD SED  03/31/2017  . NO PAST SURGERIES      Social History Social History   Socioeconomic History  . Marital status: Widowed    Spouse name: Not on file  . Number of children: Not on file  . Years of education: Not on file  . Highest education level: Not on file  Occupational History  . Not on file  Social Needs  . Financial resource strain: Not on file  . Food insecurity:    Worry: Not on file    Inability: Not on file  . Transportation needs:    Medical: Not on file    Non-medical: Not on file  Tobacco Use  . Smoking status: Never Smoker  . Smokeless tobacco: Never Used  Substance and Sexual Activity  . Alcohol use: No    Frequency: Never  . Drug use: No  . Sexual activity: Not on file  Lifestyle  . Physical activity:    Days  per week: Not on file    Minutes per session: Not on file  . Stress: Not on file  Relationships  . Social connections:    Talks on phone: Not on file    Gets together: Not on file    Attends religious service: Not on file    Active member of club or organization: Not on file    Attends meetings of clubs or organizations: Not on file    Relationship status: Not on file  . Intimate partner violence:    Fear of current or ex partner: Not on file    Emotionally abused: Not on file    Physically abused: Not on file    Forced sexual activity: Not on file  Other Topics Concern  . Not on file  Social  History Narrative  . Not on file     No Known Allergies  Family History  Problem Relation Age of Onset  . CAD Mother   . Heart disease Mother       Prior to Admission medications   Medication Sig Start Date End Date Taking? Authorizing Provider  Amino Acids-Protein Hydrolys (FEEDING SUPPLEMENT, PRO-STAT SUGAR FREE 64,) LIQD Place 30 mLs into feeding tube daily.    [provider]  atorvastatin (LIPITOR) 40 MG tablet Place 1 tablet (40 mg total) into feeding tube daily at 6 PM. 04/02/17   Aline August, MD  carboxymethylcellulose (REFRESH PLUS) 0.5 % SOLN Place 1 drop into both eyes every 8 (eight) hours as needed (dry eyes).    [provider]  carvedilol (COREG) 3.125 MG tablet Place 1 tablet (3.125 mg total) into feeding tube 2 (two) times daily with a meal. 04/02/17   Aline August, MD  clopidogrel (PLAVIX) 75 MG tablet Place 1 tablet (75 mg total) into feeding tube daily. 04/26/17   Dana Allan I, MD  clotrimazole (GYNE-LOTRIMIN) 1 % vaginal cream Place 1 Applicatorful vaginally at bedtime. 04/25/17   Dana Allan I, MD  enoxaparin (LOVENOX) 30 MG/0.3ML injection Inject 0.3 mLs (30 mg total) into the skin daily. 03/28/17   Swinteck, Aaron Edelman, MD  HYDROcodone-acetaminophen (NORCO/VICODIN) 5-325 MG tablet Take 1-2 tablets by mouth every 6 (six) hours as needed. Patient taking differently: Place 1-2 tablets into feeding tube every 6 (six) hours as needed (pain).  03/28/17   Swinteck, Aaron Edelman, MD  nitroGLYCERIN (NITROSTAT) 0.4 MG SL tablet Place 1 tablet (0.4 mg total) under the tongue every 5 (five) minutes as needed for chest pain. 04/02/17   Aline August, MD  Nutritional Supplements (FEEDING SUPPLEMENT, OSMOLITE 1.2 CAL,) LIQD Place 1,000 mLs into feeding tube continuous. Patient taking differently: Place 237 mLs into feeding tube 4 (four) times daily.  04/02/17   Aline August, MD  nystatin (MYCOSTATIN) 100000 UNIT/ML suspension Take 5 mLs (500,000 Units total) by  mouth 4 (four) times daily. 04/25/17   Bonnell Public, MD  Water For Irrigation, Sterile (FREE WATER) SOLN Place 200 mLs into feeding tube every 6 (six) hours. Patient taking differently: Place 200 mLs into feeding tube 4 (four) times daily.  04/02/17   Aline August, MD    Physical Exam:  Vitals:   05/28/17 0400 05/28/17 0430 05/28/17 0700 05/28/17 0730  BP: (!) 153/95 (!) 149/92 (!) 155/84 (!) 144/92  Pulse: (!) 103 (!) 105  94  Resp: (!) 25 19  (!) 23  Temp:      TempSrc:      SpO2: 97% 98%  100%  Weight:  Height:       Constitutional: NAD, calm, cachectic, ill-appearing temporal wasting noted.   Eyes: PERRL, lids and conjunctivae normal ENMT: Mucous membranes are moist, without exudate or lesions facial asymmetry noted. Neck: normal, supple, no masses, no thyromegaly Respiratory: clear to auscultation bilaterally, no wheezing, no crackles. Normal respiratory effort  Cardiovascular: Regular rate and rhythm, 2 out of 6 murmur, rubs or gallops. No extremity edema. 2+ pedal pulses. No carotid bruits.  Abdomen: Mildly distended but improved after disimpaction, non tender, No hepatosplenomegaly. Bowel sounds positive.  Musculoskeletal: no clubbing / cyanosis. Moves left side, there is right-sided weakness.   Skin: no jaundice, No lesions.  Neurologic: Sensation intact  Strength equal in left, on the right there is weakness due to recent stroke Psychiatric:   Alert and oriented x 3, flat affect  Labs on Admission: I have personally reviewed following labs and imaging studies  CBC: Recent Labs  Lab 05/27/17 2346  WBC 13.9*  NEUTROABS 11.2*  HGB 10.1*  HCT 32.4*  MCV 88.3  PLT 263    Basic Metabolic Panel: Recent Labs  Lab 05/27/17 2346  NA 140  K 3.4*  CL 106  CO2 25  GLUCOSE 134*  BUN 36*  CREATININE 0.94  CALCIUM 8.8*    GFR: Estimated Creatinine Clearance: 32 mL/min (by C-G formula based on SCr of 0.94 mg/dL).  Liver Function Tests: Recent Labs    Lab 05/27/17 2346  AST 37  ALT 55*  ALKPHOS 87  BILITOT 0.4  PROT 6.5  ALBUMIN 2.4*   Recent Labs  Lab 05/27/17 2346  LIPASE 72*   No results for input(s): AMMONIA in the last 168 hours.  Coagulation Profile: Recent Labs  Lab 05/27/17 2346  INR 1.12    Cardiac Enzymes: Recent Labs  Lab 05/27/17 2346 05/28/17 0533  TROPONINI 0.04* <0.03    BNP (last 3 results) No results for input(s): PROBNP in the last 8760 hours.  HbA1C: No results for input(s): HGBA1C in the last 72 hours.  CBG: No results for input(s): GLUCAP in the last 168 hours.  Lipid Profile: No results for input(s): CHOL, HDL, LDLCALC, TRIG, CHOLHDL, LDLDIRECT in the last 72 hours.  Thyroid Function Tests: No results for input(s): TSH, T4TOTAL, FREET4, T3FREE, THYROIDAB in the last 72 hours.  Anemia Panel: No results for input(s): VITAMINB12, FOLATE, FERRITIN, TIBC, IRON, RETICCTPCT in the last 72 hours.  Urine analysis:    Component Value Date/Time   COLORURINE YELLOW 05/28/2017 0449   APPEARANCEUR HAZY (A) 05/28/2017 0449   LABSPEC 1.016 05/28/2017 0449   PHURINE 5.0 05/28/2017 0449   GLUCOSEU NEGATIVE 05/28/2017 0449   HGBUR NEGATIVE 05/28/2017 0449   BILIRUBINUR NEGATIVE 05/28/2017 0449   KETONESUR NEGATIVE 05/28/2017 0449   PROTEINUR NEGATIVE 05/28/2017 0449   NITRITE NEGATIVE 05/28/2017 0449   LEUKOCYTESUR LARGE (A) 05/28/2017 0449    Sepsis Labs: _0 (procalcitonin:4,lacticidven:4) )No results found for this or any previous visit (from the past 240 hour(s)).   Radiological Exams on Admission: US Abdomen Complete  Result Date: 05/28/2017 CLINICAL DATA:  Initial evaluation for acute vomiting for 4 days. Gastric tube in place. EXAM: ABDOMEN ULTRASOUND COMPLETE COMPARISON:  Prior CT from 03/29/2017. FINDINGS: Gallbladder: Gallbladder sludge without cholelithiasis. Gallbladder wall measure within normal limits of 1.5 mm. No free pericholecystic fluid. No sonographic Murphy  sign elicited on exam. Common bile duct: Diameter: 6.8 mm Liver: No focal lesion identified. Within normal limits in parenchymal echogenicity. Portal vein is patent on color Doppler imaging with normal  direction of blood flow towards the liver. IVC: No abnormality visualized. Pancreas: Not visualized. Spleen: Size and appearance within normal limits. Right Kidney: Length: 9.1 cm. Increased echogenicity within the renal parenchyma, suggesting medical renal disease. No mass or hydronephrosis visualized. 4.3 x 4.3 x 4.8 cm and 1.6 x 1.4 x 1.4 cm cyst noted. Left Kidney: Not visualized. Abdominal aorta: No aneurysm visualized. Other findings: None. IMPRESSION: 1. Gallbladder sludge without cholelithiasis or evidence for acute cholecystitis. No biliary dilatation. 2. Increased echogenicity within the right renal parenchyma, suggesting medical renal disease. No hydronephrosis. Left kidney not visualized due to technical limitations. 3. Right renal cysts as above. Electronically Signed   By: Jeannine Boga M.D.   On: 05/28/2017 03:33   Dg Abd Acute W/chest  Result Date: 05/28/2017 CLINICAL DATA:  Vomiting and diarrhea. EXAM: DG ABDOMEN ACUTE W/ 1V CHEST COMPARISON:  Chest x-ray dated April 19, 2017. Abdominal x-ray dated April 20, 2017. FINDINGS: The patient is rotated to the left on the chest x-ray. Stable mild cardiomegaly. Normal pulmonary vascularity. No focal consolidation, pleural effusion, or pneumothorax. No acute osseous abnormality. Gastrostomy tube remains in place. No dilated small bowel loops or air-fluid levels. Large colonic stool burden with large stool ball in the rectum. No pneumoperitoneum. Multiple large calcified fibroids are again noted within the pelvis. IMPRESSION: 1. Large colonic stool burden with large stool ball in the rectum, concerning for constipation with fecal impaction. 2. No evidence of bowel obstruction. 3.  No active cardiopulmonary disease. Electronically Signed   By: Titus Dubin M.D.   On: 05/28/2017 07:27    EKG: Independently reviewed.  Assessment/Plan Principal Problem:   UTI (urinary tract infection) Active Problems:   GERD (gastroesophageal reflux disease)   Stroke (HCC)   Prolonged Q-T interval on ECG   Protein-calorie malnutrition, severe   Dysphagia   CKD (chronic kidney disease), stage III (HCC)     Nausea and vomiting, with intermittent periods of diarrhea, X-ray showed significant stool retention and fecal impaction, she was manually disimpacted, with a large amount of stools with significant improvement of her symptoms. Lipase 72, Alk phos normal  Continue to provide laxative in the form of warm water enema when needed, for comfort to further disimpact Antiemetics PRN IVF No indication for GI consult at this time   Hypokalemia, mild  Likely due to volume loss   Current K  3.4  Replenish 10 meq IV today  Repeat BMET in am   Urinary tract infection, urinalysis with large leukocytes, as well as yeast.  The patient had a recent UTI with yeast in urine, treated with Diflucan, but it appears that this has been resistant to the therapy per EDP. Patient asymptomatic for funguria.  White count is 10.9.  The patient is afebrile, lactic acid is 1.4 ED physician obtained ID consultation, to help in its management.  Appreciate their involvement IV Rocephin Follow urine culture  History of CVA, with right-sided weakness.  Patient is on PEG tube inserted on March 31, 2017, due to inability to swallow Nutritional consult for tube feeding Continue Plavix, Lovenox PT and OT once patient is able to mobilize Free water per home regimen Continue aspirin and statin  Chronic kidney disease stage 3, current creatinine 0.94 at baseline. Lab Results  Component Value Date   CREATININE 0.94 05/27/2017   CREATININE 0.99 04/24/2017   CREATININE 0.96 04/20/2017  IVF Repeat BMET in am    Hypertension BP  144/92   Pulse 94   Continue  home  anti-hypertensive medications per tube with Coreg Will consider PRN hydralazine  Hyperlipidemia Continue home statins   Recent left hip fracture, status post left hip hemiarthroplasty Continue Lovenox for DVT prophylaxis  Anemia of chronic disease Hemoglobin on admission 10.9  Hcult   Repeat CBC in am  No transfusion is indicated at this time    DVT prophylaxis:  Lovenox Code Status:    Full  Family Communication:  Discussed with patient Disposition Plan: Expect patient to be discharged to home after condition improves Consults called:    ID per EDP Admission status: IP Medsurg    Sharene Butters, PA-C [facility]   Amion text  [phone]   05/28/2017, 7:51 AM

## 2017-05-28 NOTE — ED Notes (Signed)
Patient transported to X-ray 

## 2017-05-28 NOTE — ED Notes (Signed)
Patient transported to Ultrasound 

## 2017-05-29 DIAGNOSIS — R112 Nausea with vomiting, unspecified: Secondary | ICD-10-CM

## 2017-05-29 DIAGNOSIS — I6302 Cerebral infarction due to thrombosis of basilar artery: Secondary | ICD-10-CM

## 2017-05-29 LAB — CBC
HCT: 28.1 % — ABNORMAL LOW (ref 36.0–46.0)
Hemoglobin: 8.5 g/dL — ABNORMAL LOW (ref 12.0–15.0)
MCH: 27.2 pg (ref 26.0–34.0)
MCHC: 30.2 g/dL (ref 30.0–36.0)
MCV: 90.1 fL (ref 78.0–100.0)
PLATELETS: 326 10*3/uL (ref 150–400)
RBC: 3.12 MIL/uL — ABNORMAL LOW (ref 3.87–5.11)
RDW: 16.9 % — AB (ref 11.5–15.5)
WBC: 10.1 10*3/uL (ref 4.0–10.5)

## 2017-05-29 LAB — BASIC METABOLIC PANEL
Anion gap: 7 (ref 5–15)
BUN: 29 mg/dL — AB (ref 6–20)
CALCIUM: 8 mg/dL — AB (ref 8.9–10.3)
CHLORIDE: 112 mmol/L — AB (ref 101–111)
CO2: 23 mmol/L (ref 22–32)
CREATININE: 0.79 mg/dL (ref 0.44–1.00)
GFR calc Af Amer: >60 mL/min (ref >60)
GFR calc non Af Amer: >60 mL/min (ref >60)
GLUCOSE: 81 mg/dL (ref 65–99)
Potassium: 3.3 mmol/L — ABNORMAL LOW (ref 3.5–5.1)
Sodium: 142 mmol/L (ref 135–145)

## 2017-05-29 LAB — URINE CULTURE

## 2017-05-29 MED ORDER — HYDRALAZINE HCL 20 MG/ML IJ SOLN: 10.0000 mg | Freq: Four times a day (QID) | INTRAMUSCULAR | Status: DC | PRN

## 2017-05-29 MED ORDER — GABAPENTIN 600 MG PO TABS
300.0000 mg | ORAL_TABLET | Freq: Two times a day (BID) | ORAL | Status: DC
  Administered 2017-05-29 – 2017-06-05 (×14): 300 mg
  Filled 2017-05-29 (×14): qty 1

## 2017-05-29 MED ORDER — POTASSIUM CHLORIDE IN NACL 20-0.9 MEQ/L-% IV SOLN
INTRAVENOUS | Status: AC
  Administered 2017-05-29 (×2): via INTRAVENOUS
  Filled 2017-05-29 (×2): qty 1000

## 2017-05-29 MED ORDER — APIXABAN 2.5 MG PO TABS
2.5000 mg | ORAL_TABLET | Freq: Two times a day (BID) | ORAL | Status: DC
  Administered 2017-05-29 (×2): 2.5 mg
  Filled 2017-05-29 (×2): qty 1

## 2017-05-29 NOTE — Progress Notes (Addendum)
Patient ID: Alyssa Savage, female   DOB: [birth year], 84 y.o.   MRN: [id number]                                                                PROGRESS NOTE                                                                                                                                                                                                             Patient Demographics:    Alyssa Savage, is a 84 y.o. female, DOB - 08/18/1930, URK:[id number]  Admit date - 05/27/2017   Admitting Physician Jani Gravel, MD  Outpatient Primary MD for the patient is Patient, No Pcp Per  LOS - 1  Outpatient Specialists:    Chief Complaint  Patient presents with  . Emesis       Brief Narrative    84 y.o. female with medical history significant for recent stroke, with residual right-sided deficits, severe dysphagia with PEG in place, history of left hip fracture in February 2019, hypertension among other issues listed below, presenting today from skilled nursing facility with nausea, vomiting, abdominal distention with intermittent watery diarrhea and constipation.  The patient is unable to speak due to recent stroke, and at the facility was less interactive (however here at the emergency department is more alert and able to interact).  In review, the patient had been diagnosed with a UTI during last admission, cultures positive for fungus, and had been placed on Diflucan per tube.  Today, her UA continues to be positive for fungus in urine, with cultures pending.  Thus, she is being admitted for the treatment of UTI, as well as management of her GI symptoms.  No documented fevers.  No apparent shortness of breath or chest pain.  She appears very weak and fatigued.  No apparent bleeding issues.  ED Course:  BP (!) 144/92   Pulse 94   Temp 98.1 F (36.7 C) (Oral)   Resp (!) 23   Ht 5' (1.524 m)   Wt 53.1 kg (117 lb)   SpO2 100%   BMI 22.85 kg/m   White count is elevated at 13.9. X-ray showed  significant stool retention and fecal impaction, she was manually disimpacted, with a large amount of stools. Urine analysis was suggestive of infection  as well, with significant yeast present. Hemoglobin 10.1. Potassium 3.4. Lipase 72. Lactic acid normal at Troponin 0.04. EKG with sinus tachycardia, no acute findings. Patient received IV antiemetics with Zofran, morphine for pain, and she also received a thorax PRN for itching      Subjective:    Alyssa Savage today has been afebrile overnite.  Denies dysuria, hematuria, flank pain, n/v. Seen by ID, no antifungal tx needed.  Awaiting urine culture.    No headache, No chest pain, No abdominal pain - No Nausea, No new weakness tingling or numbness, No Cough - SOB.   Assessment  & Plan :    Principal Problem:   UTI (urinary tract infection) Active Problems:   GERD (gastroesophageal reflux disease)   Stroke (HCC)   Prolonged Q-T interval on ECG   Protein-calorie malnutrition, severe   Dysphagia   CKD (chronic kidney disease), stage III (HCC)   Nausea and vomiting    Nausea and vomiting, with intermittent periods of diarrhea X-ray showed significant stool retention and fecal impaction, she was manually disimpacted, with a large amount of stools with significant improvement of her symptoms. Continue to provide laxative in the form of warm water enema when needed, for comfort to further disimpact Antiemetics PRN   Hypokalemia,  Replete  Repeat BMET in am  Urinary tract infection, urinalysis with large leukocytes, as well as yeast.  The patient had a recent UTI with yeast in urine, treated with Diflucan, but it appears that this has been resistant to the therapy per EDP. Patient asymptomatic for funguria.  White count is 10.9.  The patient is afebrile, lactic acid is 1.4 ID consulted by ED, no indication for antifungal medication Cont  IV Rocephin Follow urine culture Once culture back can d/c on appropriate  abx  History of CVA, with right-sided weakness.  Patient is on PEG tube inserted on March 31, 2017, due to inability to swallow Nutritional consult for tube feeding Continue Plavix, Lovenox PT and OT once patient is able to mobilize Free water per home regimen Continue aspirin and statin  Chronic kidney disease stage 3, current creatinine 0.94 at baseline. RecentLabs       Lab Results  Component Value Date   CREATININE 0.94 05/27/2017   CREATININE 0.99 04/24/2017   CREATININE 0.96 04/20/2017    IVF Repeat BMET in am    Hypertension BP  144/92   Pulse 94   Continue home anti-hypertensive medications per tube with Coreg Hydralazine 10mg  iv q6h prn sbp >160  Hyperlipidemia Continue home statins   Recent left hip fracture, status post left hip hemiarthroplasty Start Gabapentin 300mg  peg bid for pain D/c lovenox, eliquis as per pharmcy  Anemia of chronic disease Hemoglobin on admission 10.9  Hcult   Repeat CBC in am  No transfusion is indicated at this time    DVT prophylaxis:  Lovenox=> eliquis Code Status:    Full  Family Communication:  Discussed with patient and daughter Disposition Plan: Expect patient to be discharged to home after condition improves and urine culture returns Consults called:    ID per EDP Admission status: IP Medsurg         Lab Results  Component Value Date   PLT 326 05/29/2017    Antibiotics  :  Rocephin iv 4/27=>  Anti-infectives (From admission, onward)   Start     Dose/Rate Route Frequency Ordered Stop   05/28/17 0930  cefTRIAXone (ROCEPHIN) 1 g in sodium chloride 0.9 % 100 mL IVPB  1 g 200 mL/hr over 30 Minutes Intravenous Every 24 hours 05/28/17 0903          Objective:   Vitals:   05/28/17 1433 05/28/17 2133 05/29/17 0539 05/29/17 0600  BP: 122/74 109/80 (!) 162/87   Pulse: 84 91 89   Resp: 17     Temp: 97.8 F (36.6 C) 98.9 F (37.2 C) 98.5 F (36.9 C)   TempSrc: Axillary Oral Oral    SpO2: 100% 99% 100%   Weight: 52.6 kg (115 lb 15.4 oz)   54.8 kg (120 lb 13 oz)  Height:        Wt Readings from Last 3 Encounters:  05/29/17 54.8 kg (120 lb 13 oz)  04/25/17 53.3 kg (117 lb 8.1 oz)  04/04/17 54.4 kg (119 lb 14.9 oz)     Intake/Output Summary (Last 24 hours) at 05/29/2017 1029 Last data filed at 05/29/2017 0956 Gross per 24 hour  Intake 1730.75 ml  Output 500 ml  Net 1230.75 ml     Physical Exam  Awake Alert, Oriented X 3, No new F.N deficits, Normal affect [place].AT,PERRAL Supple Neck,No JVD, No cervical lymphadenopathy appriciated.  Symmetrical Chest wall movement, Good air movement bilaterally, CTAB RRR,No Gallops,Rubs or new Murmurs, No Parasternal Heave +ve B.Sounds, Abd Soft, No tenderness, No organomegaly appriciated, No rebound - guarding or rigidity. No Cyanosis, Clubbing or edema, No new Rash or bruise   Peg in place Leg sided weakness     Data Review:    CBC Recent Labs  Lab 05/27/17 2346 05/29/17 0745  WBC 13.9* 10.1  HGB 10.1* 8.5*  HCT 32.4* 28.1*  PLT 377 326  MCV 88.3 90.1  MCH 27.5 27.2  MCHC 31.2 30.2  RDW 16.6* 16.9*  LYMPHSABS 1.6  --   MONOABS 0.8  --   EOSABS 0.2  --   BASOSABS 0.0  --     Chemistries  Recent Labs  Lab 05/27/17 2346 05/29/17 0745  NA 140 142  K 3.4* 3.3*  CL 106 112*  CO2 25 23  GLUCOSE 134* 81  BUN 36* 29*  CREATININE 0.94 0.79  CALCIUM 8.8* 8.0*  AST 37  --   ALT 55*  --   ALKPHOS 87  --   BILITOT 0.4  --    ------------------------------------------------------------------------------------------------------------------ No results for input(s): CHOL, HDL, LDLCALC, TRIG, CHOLHDL, LDLDIRECT in the last 72 hours.  Lab Results  Component Value Date   HGBA1C 6.3 (H) 03/22/2017   ------------------------------------------------------------------------------------------------------------------ No results for input(s): TSH, T4TOTAL, T3FREE, THYROIDAB in the last 72 hours.  Invalid  input(s): FREET3 ------------------------------------------------------------------------------------------------------------------ No results for input(s): VITAMINB12, FOLATE, FERRITIN, TIBC, IRON, RETICCTPCT in the last 72 hours.  Coagulation profile Recent Labs  Lab 05/27/17 2346  INR 1.12    No results for input(s): DDIMER in the last 72 hours.  Cardiac Enzymes Recent Labs  Lab 05/27/17 2346 05/28/17 0533  TROPONINI 0.04* <0.03   ------------------------------------------------------------------------------------------------------------------ No results found for: BNP  Inpatient Medications  Scheduled Meds: . atorvastatin  40 mg Per Tube q1800  . carvedilol  3.125 mg Per Tube BID WC  . clopidogrel  75 mg Per Tube Daily  . enoxaparin  30 mg Subcutaneous Q24H  . feeding supplement (OSMOLITE 1.2 CAL)  237 mL Per Tube QID  . feeding supplement (PRO-STAT SUGAR FREE 64)  30 mL Per Tube Daily  . free water  200 mL Per Tube Q6H  . mouth rinse  15 mL Mouth Rinse BID   Continuous Infusions: .  sodium chloride 1 mL (05/28/17 2128)  . cefTRIAXone (ROCEPHIN)  IV Stopped (05/28/17 1149)   PRN Meds:.acetaminophen **OR** acetaminophen, bisacodyl, morphine injection, ondansetron **OR** ondansetron (ZOFRAN) IV, polyvinyl alcohol, senna-docusate  Micro Results Recent Results (from the past 240 hour(s))  MRSA PCR Screening     Status: None   Collection Time: 05/28/17  5:09 PM  Result Value Ref Range Status   MRSA by PCR NEGATIVE NEGATIVE Final    Comment:        The GeneXpert MRSA Assay (FDA approved for NASAL specimens only), is one component of a comprehensive MRSA colonization surveillance program. It is not intended to diagnose MRSA infection nor to guide or monitor treatment for MRSA infections. Performed at [facility], [place] [address] [place], [place] [address]     Radiology Reports US Abdomen Complete  Result Date: 05/28/2017 CLINICAL DATA:  Initial  evaluation for acute vomiting for 4 days. Gastric tube in place. EXAM: ABDOMEN ULTRASOUND COMPLETE COMPARISON:  Prior CT from 03/29/2017. FINDINGS: Gallbladder: Gallbladder sludge without cholelithiasis. Gallbladder wall measure within normal limits of 1.5 mm. No free pericholecystic fluid. No sonographic Murphy sign elicited on exam. Common bile duct: Diameter: 6.8 mm Liver: No focal lesion identified. Within normal limits in parenchymal echogenicity. Portal vein is patent on color Doppler imaging with normal direction of blood flow towards the liver. IVC: No abnormality visualized. Pancreas: Not visualized. Spleen: Size and appearance within normal limits. Right Kidney: Length: 9.1 cm. Increased echogenicity within the renal parenchyma, suggesting medical renal disease. No mass or hydronephrosis visualized. 4.3 x 4.3 x 4.8 cm and 1.6 x 1.4 x 1.4 cm cyst noted. Left Kidney: Not visualized. Abdominal aorta: No aneurysm visualized. Other findings: None. IMPRESSION: 1. Gallbladder sludge without cholelithiasis or evidence for acute cholecystitis. No biliary dilatation. 2. Increased echogenicity within the right renal parenchyma, suggesting medical renal disease. No hydronephrosis. Left kidney not visualized due to technical limitations. 3. Right renal cysts as above. Electronically Signed   By: Jeannine Boga M.D.   On: 05/28/2017 03:33   Dg Abd Acute W/chest  Result Date: 05/28/2017 CLINICAL DATA:  Vomiting and diarrhea. EXAM: DG ABDOMEN ACUTE W/ 1V CHEST COMPARISON:  Chest x-ray dated April 19, 2017. Abdominal x-ray dated April 20, 2017. FINDINGS: The patient is rotated to the left on the chest x-ray. Stable mild cardiomegaly. Normal pulmonary vascularity. No focal consolidation, pleural effusion, or pneumothorax. No acute osseous abnormality. Gastrostomy tube remains in place. No dilated small bowel loops or air-fluid levels. Large colonic stool burden with large stool ball in the rectum. No  pneumoperitoneum. Multiple large calcified fibroids are again noted within the pelvis. IMPRESSION: 1. Large colonic stool burden with large stool ball in the rectum, concerning for constipation with fecal impaction. 2. No evidence of bowel obstruction. 3.  No active cardiopulmonary disease. Electronically Signed   By: Titus Dubin M.D.   On: 05/28/2017 07:27    Time Spent in minutes  30   Jani Gravel M.D on 05/29/2017 at 10:29 AM  Between 7am to 7pm - Pager - [phone]    After 7pm go to [email or web address] - password [facility]  [facility] -  Office  [phone]

## 2017-05-29 NOTE — Progress Notes (Signed)
ANTICOAGULATION CONSULT NOTE - Initial Consult  Pharmacy Consult for Eliquis Indication: DVT px s/p L THA  No Known Allergies  Patient Measurements: Height: 5' (152.4 cm) Weight: 120 lb 13 oz (54.8 kg) IBW/kg (Calculated) : 45.5  Vital Signs: Temp: 98.5 F (36.9 C) (04/28 0539) Temp Source: Oral (04/28 0539) BP: 162/87 (04/28 0539) Pulse Rate: 89 (04/28 0539)  Labs: Recent Labs    05/27/17 2346 05/28/17 0533 05/29/17 0745  HGB 10.1*  --  8.5*  HCT 32.4*  --  28.1*  PLT 377  --  326  LABPROT 14.3  --   --   INR 1.12  --   --   CREATININE 0.94  --  0.79  TROPONINI 0.04* <0.03  --     Estimated Creatinine Clearance: 40.7 mL/min (by C-G formula based on SCr of 0.79 mg/dL).  Medical History: Past Medical History:  Diagnosis Date  . Femoral neck fracture ([place]) 03/2017   left   . GERD (gastroesophageal reflux disease)   . Hypertensive emergency 03/2017  . Hypokalemia 03/2017  . Stroke [facility])    Assessment: 84 yo F presents with emesis. Now s/p L THA. Switching from Lovenox to Eliquis. Hgb 8.5, plts wnl.  Goal of Therapy:  Monitor platelets by anticoagulation protocol: Yes   Plan:  Start Eliquis 2.5mg  PO BID per tube Monitor CBC, s/s of bleed   Glendon Dunwoody J 05/29/2017,12:09 PM

## 2017-05-30 ENCOUNTER — Encounter (HOSPITAL_COMMUNITY): Payer: Self-pay | Admitting: Internal Medicine

## 2017-05-30 DIAGNOSIS — E46 Unspecified protein-calorie malnutrition: Secondary | ICD-10-CM

## 2017-05-30 DIAGNOSIS — D259 Leiomyoma of uterus, unspecified: Secondary | ICD-10-CM

## 2017-05-30 DIAGNOSIS — R197 Diarrhea, unspecified: Secondary | ICD-10-CM

## 2017-05-30 DIAGNOSIS — R710 Precipitous drop in hematocrit: Secondary | ICD-10-CM

## 2017-05-30 DIAGNOSIS — N390 Urinary tract infection, site not specified: Secondary | ICD-10-CM

## 2017-05-30 DIAGNOSIS — E43 Unspecified severe protein-calorie malnutrition: Secondary | ICD-10-CM

## 2017-05-30 DIAGNOSIS — R319 Hematuria, unspecified: Secondary | ICD-10-CM

## 2017-05-30 DIAGNOSIS — R9431 Abnormal electrocardiogram [ECG] [EKG]: Secondary | ICD-10-CM

## 2017-05-30 DIAGNOSIS — K921 Melena: Secondary | ICD-10-CM

## 2017-05-30 DIAGNOSIS — R1310 Dysphagia, unspecified: Secondary | ICD-10-CM

## 2017-05-30 LAB — COMPREHENSIVE METABOLIC PANEL
ALBUMIN: 1.7 g/dL — AB (ref 3.5–5.0)
ALT: 61 U/L — ABNORMAL HIGH (ref 14–54)
AST: 48 U/L — AB (ref 15–41)
Alkaline Phosphatase: 69 U/L (ref 38–126)
Anion gap: 8 (ref 5–15)
BUN: 25 mg/dL — AB (ref 6–20)
CHLORIDE: 112 mmol/L — AB (ref 101–111)
CO2: 21 mmol/L — ABNORMAL LOW (ref 22–32)
Calcium: 8 mg/dL — ABNORMAL LOW (ref 8.9–10.3)
Creatinine, Ser: 0.71 mg/dL (ref 0.44–1.00)
GFR calc Af Amer: >60 mL/min (ref >60)
GFR calc non Af Amer: >60 mL/min (ref >60)
GLUCOSE: 96 mg/dL (ref 65–99)
POTASSIUM: 3.7 mmol/L (ref 3.5–5.1)
Sodium: 141 mmol/L (ref 135–145)
Total Bilirubin: 0.3 mg/dL (ref 0.3–1.2)
Total Protein: 5.1 g/dL — ABNORMAL LOW (ref 6.5–8.1)

## 2017-05-30 LAB — C DIFFICILE QUICK SCREEN W PCR REFLEX
C DIFFICILE (CDIFF) INTERP: DETECTED
C Diff antigen: POSITIVE — AB
C Diff toxin: POSITIVE — AB

## 2017-05-30 LAB — CBC
HEMATOCRIT: 25.4 % — AB (ref 36.0–46.0)
Hemoglobin: 8 g/dL — ABNORMAL LOW (ref 12.0–15.0)
MCH: 28.3 pg (ref 26.0–34.0)
MCHC: 31.5 g/dL (ref 30.0–36.0)
MCV: 89.8 fL (ref 78.0–100.0)
PLATELETS: 346 10*3/uL (ref 150–400)
RBC: 2.83 MIL/uL — AB (ref 3.87–5.11)
RDW: 17.1 % — ABNORMAL HIGH (ref 11.5–15.5)
WBC: 7.5 10*3/uL (ref 4.0–10.5)

## 2017-05-30 LAB — GLUCOSE, CAPILLARY
GLUCOSE-CAPILLARY: 82 mg/dL (ref 65–99)
GLUCOSE-CAPILLARY: 133 mg/dL — AB (ref 65–99)

## 2017-05-30 LAB — OCCULT BLOOD X 1 CARD TO LAB, STOOL: Fecal Occult Bld: POSITIVE — AB

## 2017-05-30 MED ORDER — RANITIDINE HCL 150 MG/10ML PO SYRP
150.0000 mg | ORAL_SOLUTION | Freq: Every day | ORAL | Status: DC
  Administered 2017-05-30 – 2017-06-05 (×7): 150 mg
  Filled 2017-05-30 (×7): qty 10

## 2017-05-30 MED ORDER — VANCOMYCIN 50 MG/ML ORAL SOLUTION
125.0000 mg | Freq: Four times a day (QID) | ORAL | Status: DC
  Administered 2017-05-30 – 2017-06-05 (×22): 125 mg
  Filled 2017-05-30 (×26): qty 2.5

## 2017-05-30 MED ORDER — JEVITY 1.2 CAL PO LIQD: 1000.0000 mL | ORAL | Status: DC

## 2017-05-30 MED ORDER — FAMOTIDINE IN NACL 20-0.9 MG/50ML-% IV SOLN
20.0000 mg | Freq: Two times a day (BID) | INTRAVENOUS | Status: DC
  Administered 2017-05-30: 20 mg via INTRAVENOUS
  Filled 2017-05-30: qty 50

## 2017-05-30 NOTE — Evaluation (Signed)
Physical Therapy Evaluation Patient Details Name: Alyssa Savage MRN: [id number] DOB: Oct 13, 1930 Today's Date: 05/30/2017   History of Present Illness  84 y.o. female with medical history significant for recent stroke, with residual right-sided deficits, severe dysphagia with PEG in place, history of left hip fracture in February 2019, hypertension among other issues listed below, presenting today from skilled nursing facility with nausea, vomiting, abdominal distention with intermittent watery diarrhea and constipation.  The patient is unable to speak due to recent stroke, and at the facility was less interactive (however here at the emergency department is more alert and able to interact).  In review, the patient had been diagnosed with a UTI during last admission, cultures positive for fungus, and had been placed on Diflucan per tube.  Today, her UA continues to be positive for fungus in urine, with cultures pending.  Thus, she is being admitted for the treatment of UTI, as well as management of her GI symptoms.  No documented fevers.  No apparent shortness of breath or chest pain.  She appears very weak and fatigued.  Bloody stools recently.   Clinical Impression  Patient evaluated by Physical Therapy with no further acute PT needs identified. All education has been completed and the patient has no further questions. Patient's mobility is currently quite limited due to her medical and physical deficits. Pt currently with functional limitations due to the deficits listed below (see PT Problem List).  See below for any follow-up Physical Therapy or equipment needs. Pt will benefit from skilled PT when returns to the venue listed below to increase their independence and safety with mobility.  Acute PT is signing off. Thank you for this referral.      Follow Up Recommendations SNF;Supervision/Assistance - 24 hour    Equipment Recommendations       Recommendations for Other Services        Precautions / Restrictions Precautions Precautions: Fall Restrictions Weight Bearing Restrictions: No      Mobility  Bed Mobility Overal bed mobility: Needs Assistance Bed Mobility: Rolling Rolling: Mod assist;+2 for physical assistance         General bed mobility comments: able to use her Lt UE to help with rolling  Transfers                 General transfer comment: not attempted this date  Ambulation/Gait             General Gait Details: not attempted this date  Stairs            Wheelchair Mobility    Modified Rankin (Stroke Patients Only)       Balance                                             Pertinent Vitals/Pain Pain Assessment: Faces Faces Pain Scale: Hurts even more Pain Intervention(s): Limited activity within patient's tolerance;Monitored during session;Repositioned    Home Living Family/patient expects to be discharged to:: Skilled nursing facility                      Prior Function Level of Independence: Needs assistance         Comments: Prior to CVA in February, pt completely independent; unclear if patient was participating in therapy at SNF proir to this admission.     Hand Dominance  Extremity/Trunk Assessment   Upper Extremity Assessment Upper Extremity Assessment: Defer to OT evaluation    Lower Extremity Assessment Lower Extremity Assessment: Generalized weakness;RLE deficits/detail RLE Deficits / Details: No volitional movement ascertained with assistance.       Communication   Communication: Expressive difficulties;Other (comment)  Cognition Arousal/Alertness: Awake/alert Behavior During Therapy: WFL for tasks assessed/performed Overall Cognitive Status: Within Functional Limits for tasks assessed                                 General Comments: was able to answer questions non-verbally with gestures, glutteral noises and head nods       General Comments      Exercises General Exercises - Lower Extremity Ankle Circles/Pumps: AAROM;Strengthening;Both;10 reps(for all below exercises Rt LE PROM) Hip ABduction/ADduction: Supine;AAROM;Strengthening;Both;10 reps Hip Flexion/Marching: Supine;Strengthening;Both;10 reps   Assessment/Plan    PT Assessment All further PT needs can be met in the next venue of care  PT Problem List Decreased strength;Decreased mobility;Decreased range of motion;Decreased coordination;Decreased activity tolerance       PT Treatment Interventions      PT Goals (Current goals can be found in the Care Plan section)  Acute Rehab PT Goals Patient Stated Goal: Feel better PT Goal Formulation: With patient Time For Goal Achievement: 06/13/17 Potential to Achieve Goals: Fair    Frequency     Barriers to discharge        Co-evaluation               AM-PAC PT "6 Clicks" Daily Activity  Outcome Measure Difficulty turning over in bed (including adjusting bedclothes, sheets and blankets)?: A Lot Difficulty moving from lying on back to sitting on the side of the bed? : Unable Difficulty sitting down on and standing up from a chair with arms (e.g., wheelchair, bedside commode, etc,.)?: Unable Help needed moving to and from a bed to chair (including a wheelchair)?: Total Help needed walking in hospital room?: Total Help needed climbing 3-5 steps with a railing? : Total 6 Click Score: 7    End of Session   Activity Tolerance: Patient limited by fatigue;Other (comment)(patient also exhibited somewhat continnuous bloody BM with movement of LEs; deferred EOB and OOB activities until GI issues can be addressed.) Patient left: in bed;with bed alarm set;with call bell/phone within reach Nurse Communication: Mobility status PT Visit Diagnosis: Muscle weakness (generalized) (M62.81);Difficulty in walking, not elsewhere classified (R26.2);Hemiplegia and hemiparesis Hemiplegia - Right/Left:  Right Hemiplegia - caused by: Cerebral infarction    Time: 1015-1040 PT Time Calculation (min) (ACUTE ONLY): 25 min   Charges:   PT Evaluation $PT Eval Moderate Complexity: 1 Mod     PT G Codes:        [place] Welliver D. Hartnett-Rands, MS, PT Per [place] [phone] 05/30/2017, 11:16 AM

## 2017-05-30 NOTE — Evaluation (Signed)
Occupational Therapy Evaluation Patient Details Name: Alyssa Savage MRN: [id number] DOB: [birth year] Today's Date: 05/30/2017    History of Present Illness 84 y.o. female with medical history significant for recent stroke, with residual right-sided deficits, severe dysphagia with PEG in place, history of left hip fracture in February 2019, hypertension among other issues listed below, presenting today from skilled nursing facility with nausea, vomiting, abdominal distention with intermittent watery diarrhea and constipation. The patient is unable to speak due to recent stroke, and at the facility was less interactive (however here at the emergency department is more alert and able to interact). Pt admitted with UTI and management for GI symptoms.   Clinical Impression   Pt admitted from SNF. She was able to respond to questions today with nodding and gesturing today. Pt indicated that she had been participating in therapies at SNF but only bed level exercises. She requires total assistance for ADL at SNF. Pt with no active movement with R UE and spasticity noted throughout but PROM WFL. Positioned R UE supported on blanket roll with hand in functional position and pt able to sustain. Educated Therapist, sports concerning positioning recommendations with RUE and she verbalized understanding. Additionally recommended Prevalon boots to minimize risk of skin breakdown on B heels and RN reports she will obtain these for pt. OT and PT assisted pt in toileting hygiene after bowel movement and noted significant blood in stool. RN aware. Recommend return to SNF once medically stable. Any further OT needs will be met at next venue of care. Acute OT will sign off.      Follow Up Recommendations  SNF    Equipment Recommendations  Other (comment)(defer to next venue of care)    Recommendations for Other Services       Precautions / Restrictions Precautions Precautions: Fall Restrictions Weight Bearing Restrictions:  No      Mobility Bed Mobility Overal bed mobility: Needs Assistance Bed Mobility: Rolling Rolling: Mod assist;+2 for physical assistance         General bed mobility comments: able to use L UE to assist with rolling  Transfers                 General transfer comment: unsafe to attempt; pt remains in bed at [facility]    Balance                                           ADL either performed or assessed with clinical judgement   ADL Overall ADL's : At baseline                                       General ADL Comments: total assistance for ADL; OT and PT assisted pt in pericare     Vision Patient Visual Report: No change from baseline Additional Comments: Makes eye contact, tracks therapist's movement around room. Occulomotor ROM in tact.      Perception     Praxis      Pertinent Vitals/Pain Pain Assessment: Faces Faces Pain Scale: Hurts even more Pain Location: Grimacing with R UE PROM and R LE PROM.  Pain Intervention(s): Limited activity within patient's tolerance;Monitored during session;Repositioned     Hand Dominance     Extremity/Trunk Assessment Upper Extremity Assessment Upper Extremity Assessment: RUE deficits/detail;LUE deficits/detail RUE Deficits /  Details: Noted spasticity throughout RUE; PROM WFL. Pt maintains slight flexion throughout hand but is not digging into palm and able to position with open and functional hand position. No shoulder subluxation noted. Recommended to RN to maintain  RUE supported on pillow.  LUE Deficits / Details: generalized weakness with poor fine motor coordination; grossly 3/5 strength   Lower Extremity Assessment Lower Extremity Assessment: Defer to PT evaluation RLE Deficits / Details: No volitional movement ascertained with assistance.       Communication Communication Communication: Expressive difficulties;Other (comment)   Cognition Arousal/Alertness: Awake/alert Behavior  During Therapy: WFL for tasks assessed/performed Overall Cognitive Status: Within Functional Limits for tasks assessed                                 General Comments: was able to answer questions non-verbally with gestures, glutteral noises and head nods   General Comments  Note R heel with padded dressing. Recommended Prevalon boots to maintain skin integrity.     Exercises General Exercises - Lower Extremity Ankle Circles/Pumps: AAROM;Strengthening;Both;10 reps(for all below exercises Rt LE PROM) Hip ABduction/ADduction: Supine;AAROM;Strengthening;Both;10 reps Hip Flexion/Marching: Supine;Strengthening;Both;10 reps   Shoulder Instructions      Home Living Family/patient expects to be discharged to:: Skilled nursing facility                                 Additional Comments: Has been at SNF since previous stroke      Prior Functioning/Environment Level of Independence: Needs assistance        Comments: Dependent on staff for all ADL        OT Problem List: Decreased strength;Decreased range of motion;Decreased activity tolerance;Decreased safety awareness;Decreased cognition;Decreased knowledge of use of DME or AE;Decreased knowledge of precautions;Impaired UE functional use      OT Treatment/Interventions:      OT Goals(Current goals can be found in the care plan section) Acute Rehab OT Goals Patient Stated Goal: Feel better OT Goal Formulation: With patient Time For Goal Achievement: 06/13/17 Potential to Achieve Goals: Good  OT Frequency:     Barriers to D/C:            Co-evaluation PT/OT/SLP Co-Evaluation/Treatment: Yes Reason for Co-Treatment: Complexity of the patient's impairments (multi-system involvement);For patient/therapist safety   OT goals addressed during session: ADL's and self-care;Strengthening/ROM      AM-PAC PT "6 Clicks" Daily Activity     Outcome Measure Help from another Ker[place] eating meals?: Total(PEG  tube) Help from another Schippers taking care of personal grooming?: Total Help from another [place] toileting, which includes using toliet, bedpan, or urinal?: Total Help from another [place] bathing (including washing, rinsing, drying)?: Total Help from another Go[place] to put on and taking off regular upper body clothing?: Total Help from another Delaurentis to put on and taking off regular lower body clothing?: Total 6 Click Score: 6   End of Session Nurse Communication: Mobility status  Activity Tolerance: Patient tolerated treatment well Patient left: in bed;with bed alarm set;with call bell/phone within reach  OT Visit Diagnosis: Other symptoms and signs involving cognitive function;Hemiplegia and hemiparesis Hemiplegia - Right/Left: Right Hemiplegia - caused by: Cerebral infarction                Time: 1013-1040 OT Time Calculation (min): 27 min Charges:  OT General Charges $OT Visit: 1 Visit OT Evaluation $OT Eval  Moderate Complexity: 1 Mod OT Treatments $Self Care/Home Management : 8-22 mins G-Codes:     Norman Herrlich, MS OTR/L  Pager: [place] A Sloane Junkin 05/30/2017, 1:44 PM

## 2017-05-30 NOTE — NC FL2 (Signed)
[place] MEDICAID FL2 LEVEL OF CARE SCREENING TOOL     IDENTIFICATION  Patient Name: Alyssa Savage Birthdate: 12-19-1930 Sex: female Admission Date (Current Location): 05/27/2017  [facility] and [place] Number:  Herbalist and Address:  [facility], [place] [address] [place], [place] [address]      Provider Number: [id number]  Attending Physician Name and Address:  Kayleen Memos, DO  Relative Name and Phone Number:       Current Level of Care: Hospital Recommended Level of Care: [place] Prior Approval Number:    Date Approved/Denied:   PASRR Number: [id number]  Discharge Plan: SNF    Current Diagnoses: Patient Active Problem List   Diagnosis Date Noted  . Nausea and vomiting 05/28/2017  . Constipation   . Acute CVA (cerebrovascular accident) ([place]) 04/22/2017  . CKD (chronic kidney disease), stage III ([place]) 04/20/2017  . Acute encephalopathy 04/19/2017  . UTI (urinary tract infection) 04/19/2017  . Dysphagia 04/19/2017  . Displaced fracture of left femoral neck ([place]) 03/25/2017  . Protein-calorie malnutrition, severe 03/24/2017  . Fall 03/21/2017  . Fracture of femoral neck, left, closed ([place]) 03/21/2017  . AKI (acute kidney injury) ([place]) 03/21/2017  . Hypertensive emergency 03/21/2017  . Elevated troponin 03/21/2017  . Hypokalemia 03/21/2017  . Stroke ([place]) 03/21/2017  . GERD (gastroesophageal reflux disease)   . Closed fracture of left hip ([place])   . Prolonged Q-T interval on ECG     Orientation RESPIRATION BLADDER Height & Weight     (pt is alert but nonverbal)  Normal Incontinent, External catheter Weight: 120 lb 13 oz (54.8 kg) Height:  5' (152.4 cm)  BEHAVIORAL SYMPTOMS/MOOD NEUROLOGICAL BOWEL NUTRITION STATUS      Incontinent Feeding tube(gastrostomy tube LUQ Q4 feeds)  AMBULATORY STATUS COMMUNICATION OF NEEDS Skin   Extensive Assist Non-Verbally Normal                       Personal Care  Assistance Level of Assistance  Bathing, Feeding, Dressing Bathing Assistance: Maximum assistance Feeding assistance: Independent Dressing Assistance: Maximum assistance     Functional Limitations Info  Sight, Hearing, Speech Sight Info: Adequate Hearing Info: Adequate Speech Info: Impaired(pt is nonverbal)    SPECIAL CARE FACTORS FREQUENCY  PT (By licensed PT), OT (By licensed OT)     PT Frequency: 5x week OT Frequency: 5x week            Contractures Contractures Info: Not present    Additional Factors Info  Code Status, Allergies Code Status Info: Full Code Allergies Info: No Known Allergies           Current Medications (05/30/2017):  This is the current hospital active medication list Current Facility-Administered Medications  Medication Dose Route Frequency Provider Last Rate Last Dose  . acetaminophen (TYLENOL) tablet 650 mg  650 mg Oral Q6H PRN Rondel Jumbo, PA-C       Or  . acetaminophen (TYLENOL) suppository 650 mg  650 mg Rectal Q6H PRN Rondel Jumbo, PA-C      . atorvastatin (LIPITOR) tablet 40 mg  40 mg Per Tube q1800 Rondel Jumbo, PA-C   40 mg at 05/29/17 1853  . bisacodyl (DULCOLAX) suppository 10 mg  10 mg Rectal Daily PRN Rondel Jumbo, PA-C      . carvedilol (COREG) tablet 3.125 mg  3.125 mg Per Tube BID WC Sharene Butters E, PA-C   3.125 mg at 05/29/17 1623  .  cefTRIAXone (ROCEPHIN) 1 g in sodium chloride 0.9 % 100 mL IVPB  1 g Intravenous Q24H Rondel Jumbo, PA-C   Stopped at 05/30/17 1009  . clopidogrel (PLAVIX) tablet 75 mg  75 mg Per Tube Daily Rondel Jumbo, PA-C   75 mg at 05/29/17 1221  . famotidine (PEPCID) IVPB 20 mg premix  20 mg Intravenous Q12H Hall, Carole N, DO      . feeding supplement (OSMOLITE 1.2 CAL) liquid 237 mL  237 mL Per Tube QID Rondel Jumbo, PA-C   Stopped at 05/29/17 2356  . feeding supplement (PRO-STAT SUGAR FREE 64) liquid 30 mL  30 mL Per Tube Daily Sharene Butters E, PA-C   30 mL at 05/29/17 1222  . free  water 200 mL  200 mL Per Tube Q6H Sharene Butters E, PA-C   200 mL at 05/30/17 0626  . gabapentin (NEURONTIN) tablet 300 mg  300 mg Per Tube BID Jani Gravel, MD   300 mg at 05/29/17 2356  . hydrALAZINE (APRESOLINE) injection 10 mg  10 mg Intravenous Q6H PRN Jani Gravel, MD      . MEDLINE mouth rinse  15 mL Mouth Rinse BID Jani Gravel, MD   15 mL at 05/30/17 0939  . morphine 4 MG/ML injection 1 mg  1 mg Intravenous Q4H PRN Rondel Jumbo, PA-C   1 mg at 05/29/17 1220  . ondansetron (ZOFRAN) tablet 4 mg  4 mg Oral Q6H PRN Rondel Jumbo, PA-C       Or  . ondansetron (ZOFRAN) injection 4 mg  4 mg Intravenous Q6H PRN Sharene Butters E, PA-C      . polyvinyl alcohol (LIQUIFILM TEARS) 1.4 % ophthalmic solution 1 drop  1 drop Both Eyes Q8H PRN Wertman, Sara E, PA-C      . senna-docusate (Senokot-S) tablet 1 tablet  1 tablet Oral QHS PRN Rondel Jumbo, PA-C         Discharge Medications: Please see discharge summary for a list of discharge medications.  Relevant Imaging Results:  Relevant Lab Results:   Additional Information SS# [id number]; pt has tube feeds; pt has right sided paralysis  Alexander Mt, LCSWA

## 2017-05-30 NOTE — Social Work (Signed)
CSW aware pt is from [facility]. CSW will continue to follow, available for support and facilitation of discharge whenever medically appropriate.   Alexander Mt, [place] Work [phone]

## 2017-05-30 NOTE — Progress Notes (Signed)
ON-Call Practitioner notified of BRBPR with BM this am. Awaiting new orders

## 2017-05-30 NOTE — Consult Note (Addendum)
[facility] Consult: 4:31 PM 05/30/2017  LOS: 2 days    Referring Provider: Dr Nevada Crane  Primary Care Physician:  MD at SNF ?Marland Kitchen   Primary Gastroenterologist:  Althia Forts.       Reason for Consultation:  melana and anemia.      IMPRESSION:   *   Dropping Hgb.  Acute on chronic anemia.  Some blood tinged stool earlier today.  *   7 to 10 days of increased frequency of liquid stools.  Need to rule out C. Difficile.  Blood may be from intestinal/rectal irritation assoc with diarrhea or from colitis.    *    Recent strokes, on aspirin initially but replaced with Plavix and Lovenox last month.  *    Recurrent yeast UTI.  *    Expressive aphasia, right hemiparesis, dysphagia following CVAs. S/p G-tube placement 03/2017.  Has symptoms of ongoing dysphagia with inability to handle her secretions.  *  Protein calorie malnutrition.    *    Mild transaminitis of unclear significance.  Improved c/w last month.    Marland Kitchen     PLAN:     *    Ordered stool for C. difficile along with enteric precautions. CBC, CMET in AM  *   Since this is now confirmed to be a fungal UTI, I discontinued the Rocephin.  Alerted Dr. Nevada Crane so that she can add antifungal of her choice.  *  Trend Hgb.  No plans for EGD at present.  Leave Plavix in place.  Continue to hold lovenox.     *  No need of IV Pepcid, will switch to po Zantac (Famotidine carries warning in setting or prolonged QT, Zantac does not).       Azucena Freed  05/30/2017, 2:28 PM Phone [phone]    [place] Attending   I have taken an interval history, reviewed the chart and examined the patient. I agree with the Advanced Practitioner's note, impression and recommendations.   ? C diff - if negative then will need to consider sigmoidoscopy/colonoscopy. If it is  negative repeat AXR first  Overflow problems also possible as stool ball in rectum on xray though no impaction on SG rectal, ? Higher impaction  We did not see melena or hematochezia but subsequently nurse said "clots" and brown stools. Hgb 3/22 was 8.6 so decrease we are seeing could be reequilibration with hydration vs bleeding or both  F/U LFT's   Gatha Mayer, MD, [facility] 05/30/2017 4:29 PM    HPI: Alyssa Savage is a 84 y.o. female.   Patient admitted 03/21/2017-04/02/2017 with acute left sided stroke.  Associated with the stroke was a fall resulting in a left femoral neck fracture.  S/P 2/22 left hip hemiarthroplasty. After failing MBS swallow study she underwent G-tube placement by IR on 2/28.  She discharged to skilled nursing facility with right hemiparesis on new RX of ASA.   Problems listed during that admission included AKI, severe protein calorie malnutrition (pre-albumin 8.5),  hypertensive emergency, anemia (nadir Hgb 8.3 on  04/01/17). 03/29/2017 noncontrast CT of the abdomen, performed as part of G-tube placement showed probable myomatous uterus.  The area of small bowel and colon visualized was unremarkable.   At the time of her admission she had not seen a physician in over 60 years.  No previou EGD or colonoscopy.    Admitted 3/19 - 04/25/17 with encephalopathy and a new small right paramedian pons infarct.  Plavix replaced the aspirin.  Stage 3 CKD was stable.  + Fungal UTI, treated with Diflucan.  Elevated transaminases (75/117), normal alk phos and T bili were noted, felt possibly related to tube feeding ??  At discharge was on Plavix and subQ Lovenox.  Patient admitted 05/27/2017 with nausea, vomiting and recurrent yeast UTI. Hgb initially 10.1 but has dropped to 10.5 >> 8.  Hgb was 10.5 on 3/19 AST/ALT 48/61.  Normal T bili and alk phos.  Albumin low at 1.7.  For 7 to 10 days having 7 or 8 loose, brown stools at SNF.  No bleeding.  Prior to that,  when she was getting the exact same tube feeding formulas, she was not having frequent stools.   if coughs during administration of tube feeds, regurgitates.  Cousin says the regurgitated material has been brown on occasion.  Pt has difficulty handling her secretions and sometimes regurgitates and drools these.  Within the last week or so she had a chest x-ray just to check to make sure she had not aspirated in this, according to the cousin, was negative.  There has been some minor to moderate mucopurulent discharge from G tube site, but has not received abx.  Last week she did complain of some abdominal pain which sounds like it was not focal.      Past Medical History:  Diagnosis Date  . Femoral neck fracture ([place]) 03/2017   left   . GERD (gastroesophageal reflux disease)   . Hypertensive emergency 03/2017  . Hypokalemia 03/2017  . Stroke [facility])     Past Surgical History:  Procedure Laterality Date  . ANTERIOR APPROACH HEMI HIP ARTHROPLASTY Left 03/25/2017   Procedure: ANTERIOR APPROACH HEMI HIP ARTHROPLASTY;  Surgeon: Rod Can, MD;  Location: ;  Service: Orthopedics;  Laterality: Left;  . IR GASTROSTOMY TUBE MOD SED  03/31/2017    Prior to Admission medications   Medication Sig Start Date End Date Taking? Authorizing Provider  Amino Acids-Protein Hydrolys (FEEDING SUPPLEMENT, PRO-STAT SUGAR FREE 64,) LIQD Place 30 mLs into feeding tube daily.   Yes [provider]  atorvastatin (LIPITOR) 40 MG tablet Place 1 tablet (40 mg total) into feeding tube daily at 6 PM. 04/02/17  Yes Alekh, Kshitiz, MD  carboxymethylcellulose (REFRESH PLUS) 0.5 % SOLN Place 1 drop into both eyes every 8 (eight) hours as needed (dry eyes).   Yes [provider]  carvedilol (COREG) 3.125 MG tablet Place 1 tablet (3.125 mg total) into feeding tube 2 (two) times daily with a meal. 04/02/17  Yes Starla Link, Kshitiz, MD  cetirizine (ZYRTEC) 10 MG tablet Place 10 mg into feeding tube daily.   Yes  [provider]  cholestyramine (QUESTRAN) 4 g packet Take 4 g by mouth 2 (two) times daily.   Yes [provider]  clopidogrel (PLAVIX) 75 MG tablet Place 1 tablet (75 mg total) into feeding tube daily. 04/26/17  Yes Dana Allan I, MD  clotrimazole (GYNE-LOTRIMIN) 1 % vaginal cream Place 1 Applicatorful vaginally at bedtime. 04/25/17  Yes Bonnell Public, MD  enoxaparin (LOVENOX) 30 MG/0.3ML  injection Inject 0.3 mLs (30 mg total) into the skin daily. 03/28/17  Yes Swinteck, Aaron Edelman, MD  HYDROcodone-acetaminophen (NORCO/VICODIN) 5-325 MG tablet Take 1-2 tablets by mouth every 6 (six) hours as needed. Patient taking differently: Place 1-2 tablets into feeding tube every 6 (six) hours as needed (pain).  03/28/17  Yes Swinteck, Aaron Edelman, MD  loperamide (IMODIUM) 1 MG/5ML solution Take 2 mg by mouth as needed for diarrhea or loose stools (as needed for 3 days).   Yes [provider]  nitroGLYCERIN (NITROSTAT) 0.4 MG SL tablet Place 1 tablet (0.4 mg total) under the tongue every 5 (five) minutes as needed for chest pain. 04/02/17  Yes Aline August, MD  Nutritional Supplements (FEEDING SUPPLEMENT, OSMOLITE 1.2 CAL,) LIQD Place 1,000 mLs into feeding tube continuous. Patient taking differently: Place 237 mLs into feeding tube 4 (four) times daily.  04/02/17  Yes Aline August, MD  nystatin (MYCOSTATIN) 100000 UNIT/ML suspension Take 5 mLs (500,000 Units total) by mouth 4 (four) times daily. 04/25/17  Yes Bonnell Public, MD  Phenylephrine-DM-GG (MUCINEX FAST-MAX CONGEST COUGH) 2.5-5-100 MG/5ML LIQD Give 20 mLs by tube 4 (four) times daily as needed (cough).   Yes [provider]  Water For Irrigation, Sterile (FREE WATER) SOLN Place 200 mLs into feeding tube every 6 (six) hours. Patient taking differently: Place 200 mLs into feeding tube 4 (four) times daily.  04/02/17  Yes Aline August, MD    Scheduled Meds: . atorvastatin  40 mg Per Tube q1800  . carvedilol  3.125  mg Per Tube BID WC  . clopidogrel  75 mg Per Tube Daily  . feeding supplement (OSMOLITE 1.2 CAL)  237 mL Per Tube QID  . feeding supplement (PRO-STAT SUGAR FREE 64)  30 mL Per Tube Daily  . free water  200 mL Per Tube Q6H  . gabapentin  300 mg Per Tube BID  . mouth rinse  15 mL Mouth Rinse BID  . ranitidine  150 mg Per Tube Daily   Infusions:  PRN Meds: acetaminophen **OR** acetaminophen, bisacodyl, hydrALAZINE, morphine injection, ondansetron **OR** ondansetron (ZOFRAN) IV, polyvinyl alcohol, senna-docusate   Allergies as of 05/27/2017  . (No Known Allergies)    Family History  Problem Relation Age of Onset  . CAD Mother   . Heart disease Mother     Social History   Social History Narrative   Widowed   + children   No EtOH, tobacco     REVIEW OF SYSTEMS: Constitutional: Generally quite frail. ENT:  No nose bleeds Pulm: Difficulty handling oral secretions and has a wet quality to her breathing but is not able to cough up her secretions. CV:  No palpitations, no LE edema.  GU: urine is coffee colored in suction canister (form pure wick urine collection device) GI:  Per HPI.  Throughout her adulthood she has had problems with constipation but never with rectal bleeding. Heme: No excessive or unusual bleeding  Transfusions:   1 U PRBCs on 2/22, day of hip surgery.   Neuro:  No headaches, no peripheral tingling or numbness Derm:  No itching, no rash or sores.  Endocrine:  No sweats or chills.  No polyuria or dysuria Immunization: Immunizations listed Travel:  None beyond local counties in last few months.    PHYSICAL EXAM: Vital signs in last 24 hours: Vitals:   05/30/17 1435 05/30/17 1438  BP: (!) 157/101 (!) 161/83  Pulse: 99 88  Resp:    Temp: 98.4 F (36.9 C)   SpO2:  97%    Wt Readings from Last 3 Encounters:  05/29/17 120 lb 13 oz (54.8 kg)  04/25/17 117 lb 8.1 oz (53.3 kg)  04/04/17 119 lb 14.9 oz (54.4 kg)    General: Cachectic, chronically ill,  frail looking AAF.  She is alert and can nod appropriately.  Not able to vocalize. Head: Asymmetric facies. Eyes: No scleral icterus.  No conjunctival pallor. Ears: No obvious profound hearing deficit. Nose: No discharge Mouth: Asymmetric with some droop on the left.  Oral mucosa moist and clear.  Very few teeth Neck: No JVD, no masses, no thyromegaly. Lungs: Overall diminished breath sounds but clear.  There is a wet quality when she coughs.  Her cough is quite weak. Heart: RRR.  No MRG.  S1, S2 present. Abdomen: PEG site benign.  There is a little bit of mucopurulent material at the PEG site but it does not look infected.  Abdomen slightly bloated but soft.  There is fullness in the right lower quadrant without obvious mass.  This area is moderately tender without guarding or rebound.  No HSM, bruits, hernias..   Rectal: Brown stool on the pad beneath her, some pink tinge on the pad as well.  Liquid to soft consistency brown stool in the rectal vault but no fecal impaction.  No obvious hemorrhoids or fissures.  No palpated masses. Musc/Skeltl: Hip surgery scar on the left is well intact. Extremities: Both of her feet are wrapped in pressure protecting booties, these were not removed for exam. Neurologic: Patient follows commands.  Aphonia.  Hemiparesis in the right arm which is swollen. Skin: No rashes, no large hematomas.   Psych: Affect blunted.  Seems consistent with her recent strokes.   LAB RESULTS: Recent Labs    05/27/17 2346 05/29/17 0745 05/30/17 0756  WBC 13.9* 10.1 7.5  HGB 10.1* 8.5* 8.0*  HCT 32.4* 28.1* 25.4*  PLT 377 326 346   BMET Lab Results  Component Value Date   NA 141 05/30/2017   NA 142 05/29/2017   NA 140 05/27/2017   K 3.7 05/30/2017   K 3.3 (L) 05/29/2017   K 3.4 (L) 05/27/2017   CL 112 (H) 05/30/2017   CL 112 (H) 05/29/2017   CL 106 05/27/2017   CO2 21 (L) 05/30/2017   CO2 23 05/29/2017   CO2 25 05/27/2017   GLUCOSE 96 05/30/2017   GLUCOSE 81  05/29/2017   GLUCOSE 134 (H) 05/27/2017   BUN 25 (H) 05/30/2017   BUN 29 (H) 05/29/2017   BUN 36 (H) 05/27/2017   CREATININE 0.71 05/30/2017   CREATININE 0.79 05/29/2017   CREATININE 0.94 05/27/2017   CALCIUM 8.0 (L) 05/30/2017   CALCIUM 8.0 (L) 05/29/2017   CALCIUM 8.8 (L) 05/27/2017   LFT Recent Labs    05/27/17 2346 05/30/17 0756  PROT 6.5 5.1*  ALBUMIN 2.4* 1.7*  AST 37 48*  ALT 55* 61*  ALKPHOS 87 69  BILITOT 0.4 0.3   PT/INR Lab Results  Component Value Date   INR 1.12 05/27/2017   INR 1.09 03/31/2017   INR 0.99 03/21/2017    Lipase     Component Value Date/Time   LIPASE 72 (H) 05/27/2017 2346

## 2017-05-30 NOTE — Progress Notes (Signed)
PROGRESS NOTE  Alyssa Savage EGB:[id number] DOB: [birth year] DOA: 05/27/2017 PCP: Patient, No Pcp Per  HPI/Recap of past 24 hours: 84 y.o.femalewith medical history significant forrecent stroke, with residual right-sided deficits, severe dysphagia with PEG in place, history of left hip fracture in February 2019, hypertension among other issues listed below, presenting today from skilled nursing facility with nausea, vomiting, abdominal distention with intermittent watery diarrhea and constipation. The patient is unable to speak due to recent stroke, and at the facility was less interactive (however here at the emergency department is more alert and able to interact).In review, the patient had been diagnosed with a UTI during last admission, cultures positive for fungus, and had been placed on Diflucan per tube. Today, her UA continues to be positive for fungus in urine, with cultures pending. Thus, she is being admitted for the treatment of UTI, as well as management of her GI symptoms. No documented fevers. No apparent shortness of breath or chest pain. She appears very weak and fatigued. No apparent bleeding issues.  05/30/17: Patient seen and examined at her bedside.  She is aphasic.  Unable to obtain review of systems from her.  Called her daughter who Is also medical POA [phone] to update on current medical condition.  Per RN patient had blood in her stool -ordered FOBT.  Hematuria also noted.  GI consulted.  If hematuria is persistent we will consult urology.  Urine analysis positive for RBCs.  No sign of kidney stones on abd u/s.CT may be considered to rule out stones.  Assessment/Plan: Principal Problem:   UTI (urinary tract infection) Active Problems:   GERD (gastroesophageal reflux disease)   Stroke (HCC)   Prolonged Q-T interval on ECG   Protein-calorie malnutrition, severe   Dysphagia   CKD (chronic kidney disease), stage III (HCC)   Nausea and  vomiting  Intractable nausea and vomiting Possibly secondary to fecal impaction versus UTI Disimpaction done IV Zofran as needed  Suspected upper GI bleed Melena this morning FOBT ordered Drop in hemoglobin from 10-8 Stop Eliquis IV PPI started twice daily GI consult.  Highly appreciated Monitor CBC  Hematuria Abdominal ultrasound done on 05/28/2017 revealed right renal cyst measuring about 4.8 cm No hydronephrosis on abdominal ultrasound Left kidney is poorly visualized due to technical limitations IV fluid at 75 cc/h normal saline Continue to monitor urine output If no improvements will consult urology  Severe protein calorie malnutrition Abdomen 1.7 Dietary consult to assess protein calorie nutrition  Elevated LFTs AST and ALT elevated No sign of cholecystitis on abdominal ultrasound Repeat chemistry panel in the morning  Normocytic anemia suspect acute blood loss from suspected GI bleed Drop of hemoglobin from 10.1 on 05/27/2017 to 8.0 on 05/30/2017 Suspected melena and hematuria Stop anticoagulation Repeat CBC in the morning   Code Status: Full code  Family Communication: Daughter who is also medical POA on the phone  Disposition Plan: SNF when clinically stable   Consultants:  GI  Procedures:  None  Antimicrobials:  None  DVT prophylaxis: SCDs   Objective: Vitals:   05/29/17 0600 05/29/17 1400 05/29/17 2139 05/30/17 0451  BP:  136/87 136/82 131/81  Pulse:  92 85 91  Resp:  18 17 16   Temp:  98 F (36.7 C) 98.4 F (36.9 C) 98.6 F (37 C)  TempSrc:  Oral Oral Oral  SpO2:  99% 100% 98%  Weight: 54.8 kg (120 lb 13 oz)     Height:        Intake/Output Summary (  Last 24 hours) at 05/30/2017 1100 Last data filed at 05/30/2017 6644 Gross per 24 hour  Intake 2030.75 ml  Output 350 ml  Net 1680.75 ml   Filed Weights   05/27/17 2258 05/28/17 1433 05/29/17 0600  Weight: 53.1 kg (117 lb) 52.6 kg (115 lb 15.4 oz) 54.8 kg (120 lb 13 oz)     Exam:   General: 84 year old African-American female a phasic.  Appears uncomfortable due to nausea.    Cardiovascular: Regular rate and rhythm with no rubs or gallops.  No JVD or thyromegaly present.  Respiratory: There to auscultation with no wheezes or rales.  Abdomen: Nontender nondistended normal bowel sounds x4 quadrants.  PEG tube in place.  Musculoskeletal: Trace lower extremity edema right lower extremity appears contracted  Skin: No ulcerative lesions.  Psychiatry: Unable to assess due to aphasia.   Data Reviewed: CBC: Recent Labs  Lab 05/27/17 2346 05/29/17 0745 05/30/17 0756  WBC 13.9* 10.1 7.5  NEUTROABS 11.2*  --   --   HGB 10.1* 8.5* 8.0*  HCT 32.4* 28.1* 25.4*  MCV 88.3 90.1 89.8  PLT 377 326 034   Basic Metabolic Panel: Recent Labs  Lab 05/27/17 2346 05/29/17 0745 05/30/17 0756  NA 140 142 141  K 3.4* 3.3* 3.7  CL 106 112* 112*  CO2 25 23 21*  GLUCOSE 134* 81 96  BUN 36* 29* 25*  CREATININE 0.94 0.79 0.71  CALCIUM 8.8* 8.0* 8.0*   GFR: Estimated Creatinine Clearance: 40.7 mL/min (by C-G formula based on SCr of 0.71 mg/dL). Liver Function Tests: Recent Labs  Lab 05/27/17 2346 05/30/17 0756  AST 37 48*  ALT 55* 61*  ALKPHOS 87 69  BILITOT 0.4 0.3  PROT 6.5 5.1*  ALBUMIN 2.4* 1.7*   Recent Labs  Lab 05/27/17 2346  LIPASE 72*   No results for input(s): AMMONIA in the last 168 hours. Coagulation Profile: Recent Labs  Lab 05/27/17 2346  INR 1.12   Cardiac Enzymes: Recent Labs  Lab 05/27/17 2346 05/28/17 0533  TROPONINI 0.04* <0.03   BNP (last 3 results) No results for input(s): PROBNP in the last 8760 hours. HbA1C: No results for input(s): HGBA1C in the last 72 hours. CBG: No results for input(s): GLUCAP in the last 168 hours. Lipid Profile: No results for input(s): CHOL, HDL, LDLCALC, TRIG, CHOLHDL, LDLDIRECT in the last 72 hours. Thyroid Function Tests: No results for input(s): TSH, T4TOTAL, FREET4, T3FREE,  THYROIDAB in the last 72 hours. Anemia Panel: No results for input(s): VITAMINB12, FOLATE, FERRITIN, TIBC, IRON, RETICCTPCT in the last 72 hours. Urine analysis:    Component Value Date/Time   COLORURINE YELLOW 05/28/2017 0449   APPEARANCEUR HAZY (A) 05/28/2017 0449   LABSPEC 1.016 05/28/2017 0449   PHURINE 5.0 05/28/2017 0449   GLUCOSEU NEGATIVE 05/28/2017 0449   HGBUR NEGATIVE 05/28/2017 0449   BILIRUBINUR NEGATIVE 05/28/2017 0449   KETONESUR NEGATIVE 05/28/2017 0449   PROTEINUR NEGATIVE 05/28/2017 0449   NITRITE NEGATIVE 05/28/2017 0449   LEUKOCYTESUR LARGE (A) 05/28/2017 0449   Sepsis Labs: @LABRCNTIP (procalcitonin:4,lacticidven:4)  ) Recent Results (from the past 240 hour(s))  Urine Culture     Status: Abnormal   Collection Time: 05/28/17  4:50 AM  Result Value Ref Range Status   Specimen Description URINE, RANDOM  Final   Special Requests   Final    NONE Performed at [facility], [place] [address], [place], [place] [address]    Culture >=100,000 COLONIES/mL YEAST (A)  Final   Report Status 05/29/2017  FINAL  Final  MRSA PCR Screening     Status: None   Collection Time: 05/28/17  5:09 PM  Result Value Ref Range Status   MRSA by PCR NEGATIVE NEGATIVE Final    Comment:        The GeneXpert MRSA Assay (FDA approved for NASAL specimens only), is one component of a comprehensive MRSA colonization surveillance program. It is not intended to diagnose MRSA infection nor to guide or monitor treatment for MRSA infections. Performed at [facility], [place] [address] [place], [place] [address]       Studies: No results found.  Scheduled Meds: . apixaban  2.5 mg Per Tube BID  . atorvastatin  40 mg Per Tube q1800  . carvedilol  3.125 mg Per Tube BID WC  . clopidogrel  75 mg Per Tube Daily  . feeding supplement (OSMOLITE 1.2 CAL)  237 mL Per Tube QID  . feeding supplement (PRO-STAT SUGAR FREE 64)  30 mL Per Tube Daily  . free water  200 mL Per Tube Q6H  .  gabapentin  300 mg Per Tube BID  . mouth rinse  15 mL Mouth Rinse BID    Continuous Infusions: . 0.9 % NaCl with KCl 20 mEq / L 75 mL/hr at 05/29/17 2356  . cefTRIAXone (ROCEPHIN)  IV Stopped (05/30/17 1009)  . famotidine (PEPCID) IV       LOS: 2 days     Kayleen Memos, MD [facility] Pager [phone]  If 7PM-7AM, please contact night-coverage [email or web address] Password [email or web address] 05/30/2017, 11:00 AM

## 2017-05-30 NOTE — Progress Notes (Signed)
Assessed patient at the bedside with Dr. Nevada Crane. Pt had a bowel movement prior to shift change this AM with bright red blood in stool (sample at bedside). Per Dr. Nevada Crane, she stated she is going to consult GI and to keep patient NPO (as well as no tube feedings until GI sees patient). Patient also had purewick in with dark red/brown colored urine.

## 2017-05-30 NOTE — Progress Notes (Signed)
Initial Nutrition Assessment  DOCUMENTATION CODES:   Not applicable  INTERVENTION:   -Once medically indicated, resume:  237 ml Osmolite 1.2 QID via PEG   30 ml Prostat daily.    200 ml free water flush every 6 hours  Tube feeding regimen provides 1238 kcal (100% of needs), 68 grams of protein, and 1577 ml of H2O.   NUTRITION DIAGNOSIS:   Inadequate oral intake related to inability to eat as evidenced by NPO status.  GOAL:   Patient will meet greater than or equal to 90% of their needs  MONITOR:   Vent status, Labs, Weight trends, Skin, I & O's  REASON FOR ASSESSMENT:   Consult Enteral/tube feeding initiation and management  ASSESSMENT:   Alyssa Savage is a 84 y.o. female with medical history significant for recent stroke, with residual right-sided deficits, severe dysphagia with PEG in place, history of left hip fracture in February 2019, hypertension among other issues listed below, presenting today from skilled nursing facility with nausea, vomiting, abdominal distention with intermittent watery diarrhea and constipation.   Pt admitted with nausea, vomiting, and UTI. Pt was disimpacted in ED on 05/28/17.   Noted that pt was holding in ED on 05/28/17; pt did not transfer to floor until 1445 that day.   Reviewed I/O's: +1.5 L x 24 hours and +1.9 L since admission.  Pt unable to provide hx due to cognitive deficit. No family op caregivers present to provide further hx.   No wt hx available to assess. Pt in NPO and PEG dependent.   Per RN notes, pt has a bloody BM today; plan to hold TF until GI evaluates. TF orders is as follow: 237 ml Osmolite 1.2 QID, 30 ml Prostat daily, and 200 ml free water flush every 6 hours via PEG. Complete regimen provides 1238 kcals, 68 grams protein, and 1577 ml free water.   Pt with moderate fat and muscle depletion. Suspect this is multifactorial (advanced age vs bedbound status vs contractures). Unable to identify malnutrition at this  time.   Labs reviewed.   NUTRITION - FOCUSED PHYSICAL EXAM:    Most Recent Value  Orbital Region  Moderate depletion  Upper Arm Region  Moderate depletion  Thoracic and Lumbar Region  No depletion  Buccal Region  Mild depletion  Temple Region  Severe depletion  Clavicle Bone Region  Moderate depletion  Clavicle and Acromion Bone Region  Moderate depletion  Scapular Bone Region  Moderate depletion  Dorsal Hand  Moderate depletion  Patellar Region  Moderate depletion  Anterior Thigh Region  Moderate depletion  Posterior Calf Region  Moderate depletion  Edema (RD Assessment)  None  Hair  Reviewed  Eyes  Reviewed  Mouth  Reviewed  Skin  Reviewed  Nails  Reviewed       Diet Order:  Diet NPO time specified  EDUCATION NEEDS:   Not appropriate for education at this time  Skin:  Skin Assessment: Reviewed RN Assessment  Last BM:  05/30/17  Height:   Ht Readings from Last 1 Encounters:  05/27/17 5' (1.524 m)    Weight:   Wt Readings from Last 1 Encounters:  05/29/17 120 lb 13 oz (54.8 kg)    Ideal Body Weight:  45.5 kg  BMI:  Body mass index is 23.59 kg/m.  Estimated Nutritional Needs:   Kcal:  1200-1400  Protein:  60-75 grams  Fluid:  1.2-1.4 L    Alyssa Savage, RD, LDN, CDE Pager: [phone] After hours Pager: [phone]

## 2017-05-30 NOTE — Plan of Care (Signed)
  Problem: Education: Goal: Knowledge of General Education information will improve Outcome: Progressing   Problem: Health Behavior/Discharge Planning: Goal: Ability to manage health-related needs will improve Outcome: Progressing   Problem: Clinical Measurements: Goal: Ability to maintain clinical measurements within normal limits will improve Outcome: Progressing Goal: Will remain free from infection Outcome: Progressing Goal: Diagnostic test results will improve Outcome: Progressing Goal: Respiratory complications will improve Outcome: Progressing Goal: Cardiovascular complication will be avoided Outcome: Progressing   Problem: Activity: Goal: Risk for activity intolerance will decrease Outcome: Progressing   Problem: Nutrition: Goal: Adequate nutrition will be maintained Outcome: Progressing   Problem: Coping: Goal: Level of anxiety will decrease Outcome: Progressing   Problem: Elimination: Goal: Will not experience complications related to bowel motility Outcome: Progressing Goal: Will not experience complications related to urinary retention Outcome: Progressing   Problem: Pain Managment: Goal: General experience of comfort will improve Outcome: Progressing   Problem: Safety: Goal: Ability to remain free from injury will improve Outcome: Progressing   Problem: Skin Integrity: Goal: Risk for impaired skin integrity will decrease Outcome: Progressing   Problem: Education: Goal: Knowledge of General Education information will improve Outcome: Progressing   Problem: Health Behavior/Discharge Planning: Goal: Ability to manage health-related needs will improve Outcome: Progressing   Problem: Clinical Measurements: Goal: Ability to maintain clinical measurements within normal limits will improve Outcome: Progressing Goal: Will remain free from infection Outcome: Progressing Goal: Diagnostic test results will improve Outcome: Progressing Goal:  Respiratory complications will improve Outcome: Progressing Goal: Cardiovascular complication will be avoided Outcome: Progressing   Problem: Activity: Goal: Risk for activity intolerance will decrease Outcome: Progressing   Problem: Nutrition: Goal: Adequate nutrition will be maintained Outcome: Progressing   Problem: Coping: Goal: Level of anxiety will decrease Outcome: Progressing   Problem: Elimination: Goal: Will not experience complications related to bowel motility Outcome: Progressing Goal: Will not experience complications related to urinary retention Outcome: Progressing   Problem: Pain Managment: Goal: General experience of comfort will improve Outcome: Progressing   Problem: Safety: Goal: Ability to remain free from injury will improve Outcome: Progressing   Problem: Skin Integrity: Goal: Risk for impaired skin integrity will decrease Outcome: Progressing   

## 2017-05-31 ENCOUNTER — Inpatient Hospital Stay (HOSPITAL_COMMUNITY): Payer: Medicare Other

## 2017-05-31 ENCOUNTER — Encounter (HOSPITAL_COMMUNITY): Payer: Self-pay | Admitting: Radiology

## 2017-05-31 DIAGNOSIS — A0472 Enterocolitis due to Clostridium difficile, not specified as recurrent: Secondary | ICD-10-CM

## 2017-05-31 DIAGNOSIS — K625 Hemorrhage of anus and rectum: Secondary | ICD-10-CM

## 2017-05-31 DIAGNOSIS — D259 Leiomyoma of uterus, unspecified: Secondary | ICD-10-CM

## 2017-05-31 LAB — COMPREHENSIVE METABOLIC PANEL
ALBUMIN: 1.8 g/dL — AB (ref 3.5–5.0)
ALK PHOS: 64 U/L (ref 38–126)
ALT: 68 U/L — ABNORMAL HIGH (ref 14–54)
ANION GAP: 9 (ref 5–15)
AST: 56 U/L — ABNORMAL HIGH (ref 15–41)
BUN: 23 mg/dL — ABNORMAL HIGH (ref 6–20)
CALCIUM: 8.2 mg/dL — AB (ref 8.9–10.3)
CO2: 21 mmol/L — AB (ref 22–32)
Chloride: 113 mmol/L — ABNORMAL HIGH (ref 101–111)
Creatinine, Ser: 0.75 mg/dL (ref 0.44–1.00)
GFR calc non Af Amer: >60 mL/min (ref >60)
Glucose, Bld: 112 mg/dL — ABNORMAL HIGH (ref 65–99)
POTASSIUM: 3.6 mmol/L (ref 3.5–5.1)
SODIUM: 143 mmol/L (ref 135–145)
Total Bilirubin: 0.4 mg/dL (ref 0.3–1.2)
Total Protein: 4.9 g/dL — ABNORMAL LOW (ref 6.5–8.1)

## 2017-05-31 LAB — CBC
HCT: 24.4 % — ABNORMAL LOW (ref 36.0–46.0)
HEMOGLOBIN: 7.7 g/dL — AB (ref 12.0–15.0)
MCH: 27.8 pg (ref 26.0–34.0)
MCHC: 31.6 g/dL (ref 30.0–36.0)
MCV: 88.1 fL (ref 78.0–100.0)
PLATELETS: 364 10*3/uL (ref 150–400)
RBC: 2.77 MIL/uL — ABNORMAL LOW (ref 3.87–5.11)
RDW: 16.8 % — AB (ref 11.5–15.5)
WBC: 8.8 10*3/uL (ref 4.0–10.5)

## 2017-05-31 LAB — GLUCOSE, CAPILLARY
GLUCOSE-CAPILLARY: 104 mg/dL — AB (ref 65–99)
GLUCOSE-CAPILLARY: 86 mg/dL (ref 65–99)
GLUCOSE-CAPILLARY: 107 mg/dL — AB (ref 65–99)
GLUCOSE-CAPILLARY: 98 mg/dL (ref 65–99)
GLUCOSE-CAPILLARY: 126 mg/dL — AB (ref 65–99)
Glucose-Capillary: 194 mg/dL — ABNORMAL HIGH (ref 65–99)

## 2017-05-31 MED ORDER — IOHEXOL 300 MG/ML  SOLN
100.0000 mL | Freq: Once | INTRAMUSCULAR | Status: AC | PRN
  Administered 2017-05-31: 100 mL via INTRAVENOUS

## 2017-05-31 NOTE — Progress Notes (Addendum)
Daily Rounding Note  05/31/2017, 10:37 AM  LOS: 3 days  ASSESMENT:   *   C. Difficile Ag and toxin positive diarrhea.  Day 1-2 oral vancomycin.    *    Acute on chronic anemia with reports of slight blood-tinged stool on 4/29 and reddish brown/rusty colored stool this AM.  The blood may simply be due to increased stool frequency and colorectal irritation.  Large stool ball in rectum on KUB, concern for impaction.  ? Stercoral ulcer?.   Hgb down a bit more today.  On exam has a palpable and somewhat tender mass in RLQ.    *   Recent strokes.  On Plavix and Lovenox at the time of admission.  Now on Plavix alone.  *  Dysphagia, expressive aphasia post CVA.  S/p IR placed G tube.    *   Funguria, Dr [place] Crane decided it does not need Rx.  Marland Kitchen   PLAN   *  ? Image belly to determine what the RLQ mass is? However on non-contrast CT for G tube placement on 2/26, no colonic issues seen but note made of right pelvic, ?myomatous, mass.   Placement does not seen c/w the stool ball on the KUB.      Azucena Freed  05/31/2017, 10:37 AM Phone [place] Attending   I have taken an interval history, reviewed the chart and examined the patient. I agree with the Advanced Practitioner's note, impression and recommendations.   I looked at CT - believe the palpable RLQ mass is a calcified uterine fibroid.  Hgb stable near baseline - after initial drop  Continue current tx and await final; CT report  Gatha Mayer, MD, [facility] 05/31/2017 6:22 PM  SUBJECTIVE:   Rusty bloody looking stool this AM.  Pt c/o pain in right abdomen     OBJECTIVE:         Vital signs in last 24 hours:    Temp:  [98.4 F (36.9 C)-99.1 F (37.3 C)] 99.1 F (37.3 C) (04/30 0936) Pulse Rate:  [88-99] 89 (04/30 0936) Resp:  [17-18] 18 (04/30 0936) BP: (124-161)/(80-101) 129/80 (04/30 0936) SpO2:  [97 %-100 %] 100 %  (04/30 0936) Weight:  [117 lb 1 oz (53.1 kg)] 117 lb 1 oz (53.1 kg) (04/30 0450) Last BM Date: 05/30/17 Filed Weights   05/28/17 1433 05/29/17 0600 05/31/17 0450  Weight: 115 lb 15.4 oz (52.6 kg) 120 lb 13 oz (54.8 kg) 117 lb 1 oz (53.1 kg)   General: looks unwell, frail.  But she is alert and appropriate   Heart: RRR Chest: clear bil.   Abdomen: soft, tender on right mid and lower abdomen.  Palpable mass.  G tube in RUQ, benign.   Extremities: no CCE Neuro/Psych:  Follows commands.  Aphasic.  Right hemiparesis. Moves right leg and arm.     Intake/Output this shift: No intake/output data recorded.  Lab Results: Recent Labs    05/29/17 0745 05/30/17 0756 05/31/17 0341  WBC 10.1 7.5 8.8  HGB 8.5* 8.0* 7.7*  HCT 28.1* 25.4* 24.4*  PLT 326 346 364   BMET Recent Labs    05/29/17 0745 05/30/17 0756 05/31/17 0341  NA 142 141 143  K 3.3* 3.7 3.6  CL 112* 112* 113*  CO2 23 21* 21*  GLUCOSE 81 96 112*  BUN 29* 25* 23*  CREATININE 0.79 0.71 0.75  CALCIUM 8.0*  8.0* 8.2*   LFT Recent Labs    05/30/17 0756 05/31/17 0341  PROT 5.1* 4.9*  ALBUMIN 1.7* 1.8*  AST 48* 56*  ALT 61* 68*  ALKPHOS 69 64  BILITOT 0.3 0.4

## 2017-05-31 NOTE — Progress Notes (Signed)
PROGRESS NOTE  Alyssa Savage WHQ:[id number] DOB: [birth year] DOA: 05/27/2017 PCP: Patient, No Pcp Per  HPI/Recap of past 24 hours: 84 y.o.femalewith medical history significant forrecent stroke, with residual right-sided deficits, severe dysphagia with PEG in place, history of left hip fracture in February 2019, hypertension among other issues listed below, presenting today from skilled nursing facility with nausea, vomiting, abdominal distention with intermittent watery diarrhea and constipation. The patient is unable to speak due to recent stroke, and at the facility was less interactive (however here at the emergency department is more alert and able to interact).In review, the patient had been diagnosed with a UTI during last admission, cultures positive for fungus, and had been placed on Diflucan per tube. Today, her UA continues to be positive for fungus in urine, with cultures pending. Thus, she is being admitted for the treatment of UTI, as well as management of her GI symptoms. No documented fevers. No apparent shortness of breath or chest pain. She appears very weak and fatigued. No apparent bleeding issues.  05/30/17: Patient seen and examined at her bedside.  She is aphasic.  Unable to obtain review of systems from her.  Called her daughter who Is also medical POA [phone] to update on current medical condition.  Per RN patient had blood in her stool -ordered FOBT.  Hematuria also noted.  GI consulted.  If hematuria is persistent we will consult urology.  Urine analysis positive for RBCs.  No sign of kidney stones on abd u/s.CT may be considered to rule out stones.  05/31/2017: Patient seen and examined at her bedside.  She is alert in the setting of aphasia.  C. difficile positive.  Started on oral vancomycin on 05/30/2017.  GI following.  Pain on palpation of right lower quadrant.  Assessment/Plan: Principal Problem:   UTI (urinary tract infection) Active Problems:  GERD (gastroesophageal reflux disease)   Stroke (HCC)   Prolonged Q-T interval on ECG   Protein-calorie malnutrition, severe   Dysphagia   CKD (chronic kidney disease), stage III (HCC)   Nausea and vomiting  C. difficile colitis Positive C. difficile 01/29/2018 Started on oral vancomycin 05/30/2017 CT abdomen pelvis with contrast ordered due to tenderness on palpation of right lower quadrant. Continue to monitor  GI bleed FOBT positive on 05/30/2017 GI consulted and following On Zantac Monitor hemoglobin  Intractable nausea, stable  Acute blood loss anemia Suspect from GI bleed versus hematuria Continue to monitor CBC Hemoglobin continues to drop from 10.1 on 05/27/2017 to 7.7 on 05/31/2017 GI following.  Highly appreciated.  Worsening transaminitis No sign of cholecystitis on abdominal ultrasound LFTs continue to trend up CT abdomen pelvis with contrast ordered Repeat CMP in the morning  Hematuria Monitor for any recurrence If persistent will consult urology Abdominal ultrasound done on 05/28/2017 revealed right renal cyst measuring about 4.8 cm Continue gentle IV hydration Continue to monitor urine output  Severe protein calorie malnutrition Albumin 1.7 Continue peg tube feeding   Code Status: Full code  Family Communication: Daughter who is also medical POA on the phone on 05/30/2017.  On 05/31/2017 called daughter to update on current medical condition and left message on the phone.  Disposition Plan: SNF when clinically stable   Consultants:  GI  Procedures:  None  Antimicrobials:  None  DVT prophylaxis: SCDs   Objective: Vitals:   05/30/17 2000 05/31/17 0450 05/31/17 0936 05/31/17 1100  BP: (!) 142/91 124/88 129/80 133/86  Pulse: 93 96 89 98  Resp: 17 17 18  Temp: 99.1 F (37.3 C) 99 F (37.2 C) 99.1 F (37.3 C)   TempSrc: Oral Oral Oral   SpO2: 98% 97% 100% 97%  Weight:  53.1 kg (117 lb 1 oz)    Height:        Intake/Output Summary  (Last 24 hours) at 05/31/2017 1235 Last data filed at 05/31/2017 0948 Gross per 24 hour  Intake 837 ml  Output -  Net 837 ml   Filed Weights   05/28/17 1433 05/29/17 0600 05/31/17 0450  Weight: 52.6 kg (115 lb 15.4 oz) 54.8 kg (120 lb 13 oz) 53.1 kg (117 lb 1 oz)    Exam:   General: 84 year old African-American female frail in no acute distress.  Alert in the setting of aphasia.  Cardiovascular: Regular rate and rhythm with no rubs or gallops.  No JVD or thyromegaly present.  Respiratory: There to auscultation with no wheezes or rales.  Abdomen: Tenderness on palpation of right lower quadrant.  No distention, normal bowel sounds x4 quadrant and PEG tube in place  Musculoskeletal: Trace lower extremity edema right lower extremity appears contracted  Skin: No ulcerative lesions.  Psychiatry: Unable to assess due to aphasia.   Data Reviewed: CBC: Recent Labs  Lab 05/27/17 2346 05/29/17 0745 05/30/17 0756 05/31/17 0341  WBC 13.9* 10.1 7.5 8.8  NEUTROABS 11.2*  --   --   --   HGB 10.1* 8.5* 8.0* 7.7*  HCT 32.4* 28.1* 25.4* 24.4*  MCV 88.3 90.1 89.8 88.1  PLT 377 326 346 161   Basic Metabolic Panel: Recent Labs  Lab 05/27/17 2346 05/29/17 0745 05/30/17 0756 05/31/17 0341  NA 140 142 141 143  K 3.4* 3.3* 3.7 3.6  CL 106 112* 112* 113*  CO2 25 23 21* 21*  GLUCOSE 134* 81 96 112*  BUN 36* 29* 25* 23*  CREATININE 0.94 0.79 0.71 0.75  CALCIUM 8.8* 8.0* 8.0* 8.2*   GFR: Estimated Creatinine Clearance: 37.6 mL/min (by C-G formula based on SCr of 0.75 mg/dL). Liver Function Tests: Recent Labs  Lab 05/27/17 2346 05/30/17 0756 05/31/17 0341  AST 37 48* 56*  ALT 55* 61* 68*  ALKPHOS 87 69 64  BILITOT 0.4 0.3 0.4  PROT 6.5 5.1* 4.9*  ALBUMIN 2.4* 1.7* 1.8*   Recent Labs  Lab 05/27/17 2346  LIPASE 72*   No results for input(s): AMMONIA in the last 168 hours. Coagulation Profile: Recent Labs  Lab 05/27/17 2346  INR 1.12   Cardiac Enzymes: Recent Labs   Lab 05/27/17 2346 05/28/17 0533  TROPONINI 0.04* <0.03   BNP (last 3 results) No results for input(s): PROBNP in the last 8760 hours. HbA1C: No results for input(s): HGBA1C in the last 72 hours. CBG: Recent Labs  Lab 05/30/17 1958 05/31/17 0026 05/31/17 0416 05/31/17 0732 05/31/17 1132  GLUCAP 133* 194* 104* 86 107*   Lipid Profile: No results for input(s): CHOL, HDL, LDLCALC, TRIG, CHOLHDL, LDLDIRECT in the last 72 hours. Thyroid Function Tests: No results for input(s): TSH, T4TOTAL, FREET4, T3FREE, THYROIDAB in the last 72 hours. Anemia Panel: No results for input(s): VITAMINB12, FOLATE, FERRITIN, TIBC, IRON, RETICCTPCT in the last 72 hours. Urine analysis:    Component Value Date/Time   COLORURINE YELLOW 05/28/2017 0449   APPEARANCEUR HAZY (A) 05/28/2017 0449   LABSPEC 1.016 05/28/2017 0449   PHURINE 5.0 05/28/2017 [place] 05/28/2017 0449   HGBUR NEGATIVE 05/28/2017 [place] NEGATIVE 05/28/2017 [place] NEGATIVE 05/28/2017 0449   PROTEINUR  NEGATIVE 05/28/2017 0449   NITRITE NEGATIVE 05/28/2017 0449   LEUKOCYTESUR LARGE (A) 05/28/2017 0449   Sepsis Labs: @LABRCNTIP (procalcitonin:4,lacticidven:4)  ) Recent Results (from the past 240 hour(s))  Urine Culture     Status: Abnormal   Collection Time: 05/28/17  4:50 AM  Result Value Ref Range Status   Specimen Description URINE, RANDOM  Final   Special Requests   Final    NONE Performed at [facility], [place] [address] [place], [place] [address]    Culture >=100,000 COLONIES/mL YEAST (A)  Final   Report Status 05/29/2017 FINAL  Final  MRSA PCR Screening     Status: None   Collection Time: 05/28/17  5:09 PM  Result Value Ref Range Status   MRSA by PCR NEGATIVE NEGATIVE Final    Comment:        The GeneXpert MRSA Assay (FDA approved for NASAL specimens only), is one component of a comprehensive MRSA colonization surveillance program. It is not intended to diagnose  MRSA infection nor to guide or monitor treatment for MRSA infections. Performed at [facility], [place] [address] [place], [place] [address]   C difficile quick scan w PCR reflex     Status: Abnormal   Collection Time: 05/30/17  3:31 PM  Result Value Ref Range Status   C Diff antigen POSITIVE (A) NEGATIVE Final   C Diff toxin POSITIVE (A) NEGATIVE Final   C Diff interpretation Toxin producing C. difficile detected.  Final    Comment: CRITICAL RESULT CALLED TO, READ BACK BY AND VERIFIED WITH: Geronimo Boot RN 16:45 05/30/17 ([email or web address]) Performed at [facility], [place] [address], [place], [place] [address]       Studies: No results found.  Scheduled Meds: . atorvastatin  40 mg Per Tube q1800  . carvedilol  3.125 mg Per Tube BID WC  . clopidogrel  75 mg Per Tube Daily  . feeding supplement (OSMOLITE 1.2 CAL)  237 mL Per Tube QID  . feeding supplement (PRO-STAT SUGAR FREE 64)  30 mL Per Tube Daily  . free water  200 mL Per Tube Q6H  . gabapentin  300 mg Per Tube BID  . mouth rinse  15 mL Mouth Rinse BID  . ranitidine  150 mg Per Tube Daily  . vancomycin  125 mg Per Tube Q6H    Continuous Infusions:    LOS: 3 days     Kayleen Memos, MD [facility] Pager [phone]  If 7PM-7AM, please contact night-coverage [email or web address] Password [facility] 05/31/2017, 12:35 PM

## 2017-06-01 DIAGNOSIS — A0472 Enterocolitis due to Clostridium difficile, not specified as recurrent: Principal | ICD-10-CM

## 2017-06-01 DIAGNOSIS — K625 Hemorrhage of anus and rectum: Secondary | ICD-10-CM

## 2017-06-01 LAB — GLUCOSE, CAPILLARY
GLUCOSE-CAPILLARY: 98 mg/dL (ref 65–99)
Glucose-Capillary: 130 mg/dL — ABNORMAL HIGH (ref 65–99)
Glucose-Capillary: 149 mg/dL — ABNORMAL HIGH (ref 65–99)
Glucose-Capillary: 173 mg/dL — ABNORMAL HIGH (ref 65–99)
Glucose-Capillary: 207 mg/dL — ABNORMAL HIGH (ref 65–99)
Glucose-Capillary: 96 mg/dL (ref 65–99)

## 2017-06-01 LAB — CBC
HEMATOCRIT: 24.6 % — AB (ref 36.0–46.0)
HEMOGLOBIN: 7.9 g/dL — AB (ref 12.0–15.0)
MCH: 27.8 pg (ref 26.0–34.0)
MCHC: 32.1 g/dL (ref 30.0–36.0)
MCV: 86.6 fL (ref 78.0–100.0)
Platelets: 374 10*3/uL (ref 150–400)
RBC: 2.84 MIL/uL — ABNORMAL LOW (ref 3.87–5.11)
RDW: 17 % — ABNORMAL HIGH (ref 11.5–15.5)
WBC: 8.7 10*3/uL (ref 4.0–10.5)

## 2017-06-01 MED ORDER — SODIUM CHLORIDE 0.9 % IV SOLN
INTRAVENOUS | Status: DC
  Administered 2017-06-01 – 2017-06-05 (×3): via INTRAVENOUS

## 2017-06-01 NOTE — Progress Notes (Signed)
PROGRESS NOTE  Alyssa Savage LEX:[id number] DOB: Sep 25, 1930 DOA: 05/27/2017 PCP: Patient, No Pcp Per  HPI/Recap of past 24 hours: 84 y.o.femalewith medical history significant forrecent stroke, with residual right-sided deficits, severe dysphagia with PEG in place, history of left hip fracture in February 2019, hypertension among other issues listed below, presenting today from skilled nursing facility with nausea, vomiting, abdominal distention with intermittent watery diarrhea and constipation. The patient is unable to speak due to recent stroke, and at the facility was less interactive (however here at the emergency department is more alert and able to interact).In review, the patient had been diagnosed with a UTI during last admission, cultures positive for fungus, and had been placed on Diflucan per tube. Today, her UA continues to be positive for fungus in urine, with cultures pending. Thus, she is being admitted for the treatment of UTI, as well as management of her GI symptoms. No documented fevers. No apparent shortness of breath or chest pain. She appears very weak and fatigued. No apparent bleeding issues.  05/30/17: Patient seen and examined at her bedside.  She is aphasic.  Unable to obtain review of systems from her.  Called her daughter who Is also medical POA [phone] to update on current medical condition.  Per RN patient had blood in her stool -ordered FOBT.  Hematuria also noted.  GI consulted.  If hematuria is persistent we will consult urology.  Urine analysis positive for RBCs.  No sign of kidney stones on abd u/s.CT may be considered to rule out stones.  05/31/2017: Patient seen and examined at her bedside.  She is alert in the setting of aphasia.  C. difficile positive.  Started on oral vancomycin on 05/30/2017.  GI following.  Pain on palpation of right lower quadrant.  06/01/2017: Patient seen and examined with her daughter at bedside.  Daughter expresses that  the patient is no back to her baseline.  We will continue oral vancomycin and gentle IV fluid hydration.  Assessment/Plan: Principal Problem:   UTI (urinary tract infection) Active Problems:   GERD (gastroesophageal reflux disease)   Stroke (HCC)   Prolonged Q-T interval on ECG   Protein-calorie malnutrition, severe   Dysphagia   CKD (chronic kidney disease), stage III (HCC)   Nausea and vomiting   Clostridium difficile diarrhea   Uterine leiomyoma   Rectal bleeding  C. difficile colitis, stable Positive C. difficile 01/29/2018 Continue oral vancomycin 125 mg 4 times daily Day#2/14 days (05/30/2017) CT abdomen pelvis with contrast revealed no definite colonic wall thickening and no evidence of bowel obstruction.  Showed a large fibroid uterus.  GI bleed, stable FOBT positive on 05/30/2017 GI consulted and following On Zantac Hemoglobin 8.9 Continue to monitor CBC  Dysphagia Continue PEG tube feeding Continue to monitor flushes  Dehydration Started gentle IV hydration 75 cc/h of normal saline Continue free water flushes with PEG tube  Large fibroid uterus Follow up outpatient  Intractable nausea, resolved  Acute blood loss anemia, stable Suspect from GI bleed versus hematuria Continue to monitor CBC  Worsening transaminitis No sign of cholecystitis on abdominal ultrasound LFTs continue to trend up CT abdomen pelvis with contrast ordered Obtain CMP on 06/02/2017  Hematuria Monitor for any recurrence If persistent will consult urology Abdominal ultrasound done on 05/28/2017 revealed right renal cyst measuring about 4.8 cm Continue gentle IV hydration Continue to monitor urine output  Severe protein calorie malnutrition Albumin 1.7 Continue peg tube feeding   Code Status: Full code  Family Communication: Daughter  who is also medical POA on the phone on 05/30/2017.  On 05/31/2017 called daughter to update on current medical condition and left message on the  phone.  Disposition Plan: SNF when clinically stable   Consultants:  GI  Procedures:  None  Antimicrobials:  None  DVT prophylaxis: SCDs   Objective: Vitals:   05/31/17 1401 05/31/17 1754 05/31/17 2006 06/01/17 0658  BP: 131/82 (!) 160/94 130/74 138/86  Pulse: 87 80 95 93  Resp:   18 18  Temp: 99 F (37.2 C) 98.3 F (36.8 C) 98.1 F (36.7 C) 99.5 F (37.5 C)  TempSrc: Oral Oral Oral Oral  SpO2: 99% 100% 94% 97%  Weight:    56.1 kg (123 lb 10.9 oz)  Height:        Intake/Output Summary (Last 24 hours) at 06/01/2017 1241 Last data filed at 06/01/2017 0600 Gross per 24 hour  Intake 637 ml  Output 725 ml  Net -88 ml   Filed Weights   05/29/17 0600 05/31/17 0450 06/01/17 0658  Weight: 54.8 kg (120 lb 13 oz) 53.1 kg (117 lb 1 oz) 56.1 kg (123 lb 10.9 oz)    Exam:   General: 84 year old African-American female frail in no acute distress.  Alert in the setting of aphasia.    Cardiovascular: Regular rate and rhythm with no rubs or gallops.  No JVD or thyromegaly present.  Respiratory: Clear to auscultation with no wheezes or rales.  Poor respiratory effort.  Abdomen: Tenderness on palpation of right lower quadrant.  No distention, normal bowel sounds x4 quadrant and PEG tube in place  Musculoskeletal: Trace lower extremity edema right lower extremity appears contracted  Skin: No ulcerative lesions.  Psychiatry: Unable to assess due to aphasia.   Data Reviewed: CBC: Recent Labs  Lab 05/27/17 2346 05/29/17 0745 05/30/17 0756 05/31/17 0341 06/01/17 1009  WBC 13.9* 10.1 7.5 8.8 8.7  NEUTROABS 11.2*  --   --   --   --   HGB 10.1* 8.5* 8.0* 7.7* 7.9*  HCT 32.4* 28.1* 25.4* 24.4* 24.6*  MCV 88.3 90.1 89.8 88.1 86.6  PLT 377 326 346 364 762   Basic Metabolic Panel: Recent Labs  Lab 05/27/17 2346 05/29/17 0745 05/30/17 0756 05/31/17 0341  NA 140 142 141 143  K 3.4* 3.3* 3.7 3.6  CL 106 112* 112* 113*  CO2 25 23 21* 21*  GLUCOSE 134* 81 96 112*    BUN 36* 29* 25* 23*  CREATININE 0.94 0.79 0.71 0.75  CALCIUM 8.8* 8.0* 8.0* 8.2*   GFR: Estimated Creatinine Clearance: 41.1 mL/min (by C-G formula based on SCr of 0.75 mg/dL). Liver Function Tests: Recent Labs  Lab 05/27/17 2346 05/30/17 0756 05/31/17 0341  AST 37 48* 56*  ALT 55* 61* 68*  ALKPHOS 87 69 64  BILITOT 0.4 0.3 0.4  PROT 6.5 5.1* 4.9*  ALBUMIN 2.4* 1.7* 1.8*   Recent Labs  Lab 05/27/17 2346  LIPASE 72*   No results for input(s): AMMONIA in the last 168 hours. Coagulation Profile: Recent Labs  Lab 05/27/17 2346  INR 1.12   Cardiac Enzymes: Recent Labs  Lab 05/27/17 2346 05/28/17 0533  TROPONINI 0.04* <0.03   BNP (last 3 results) No results for input(s): PROBNP in the last 8760 hours. HbA1C: No results for input(s): HGBA1C in the last 72 hours. CBG: Recent Labs  Lab 05/31/17 2004 05/31/17 2352 06/01/17 0429 06/01/17 0743 06/01/17 1209  GLUCAP 126* 207* 96 98 130*   Lipid Profile: No results for  input(s): CHOL, HDL, LDLCALC, TRIG, CHOLHDL, LDLDIRECT in the last 72 hours. Thyroid Function Tests: No results for input(s): TSH, T4TOTAL, FREET4, T3FREE, THYROIDAB in the last 72 hours. Anemia Panel: No results for input(s): VITAMINB12, FOLATE, FERRITIN, TIBC, IRON, RETICCTPCT in the last 72 hours. Urine analysis:    Component Value Date/Time   COLORURINE YELLOW 05/28/2017 0449   APPEARANCEUR HAZY (A) 05/28/2017 0449   LABSPEC 1.016 05/28/2017 0449   PHURINE 5.0 05/28/2017 0449   GLUCOSEU NEGATIVE 05/28/2017 0449   HGBUR NEGATIVE 05/28/2017 0449   BILIRUBINUR NEGATIVE 05/28/2017 0449   KETONESUR NEGATIVE 05/28/2017 0449   PROTEINUR NEGATIVE 05/28/2017 0449   NITRITE NEGATIVE 05/28/2017 0449   LEUKOCYTESUR LARGE (A) 05/28/2017 0449   Sepsis Labs: @LABRCNTIP (procalcitonin:4,lacticidven:4)  ) Recent Results (from the past 240 hour(s))  Urine Culture     Status: Abnormal   Collection Time: 05/28/17  4:50 AM  Result Value Ref Range  Status   Specimen Description URINE, RANDOM  Final   Special Requests   Final    NONE Performed at [facility], [place] [address], [place], [place] [address]    Culture >=100,000 COLONIES/mL YEAST (A)  Final   Report Status 05/29/2017 FINAL  Final  MRSA PCR Screening     Status: None   Collection Time: 05/28/17  5:09 PM  Result Value Ref Range Status   MRSA by PCR NEGATIVE NEGATIVE Final    Comment:        The GeneXpert MRSA Assay (FDA approved for NASAL specimens only), is one component of a comprehensive MRSA colonization surveillance program. It is not intended to diagnose MRSA infection nor to guide or monitor treatment for MRSA infections. Performed at [facility], [place] [address] [place], [place] [address]   C difficile quick scan w PCR reflex     Status: Abnormal   Collection Time: 05/30/17  3:31 PM  Result Value Ref Range Status   C Diff antigen POSITIVE (A) NEGATIVE Final   C Diff toxin POSITIVE (A) NEGATIVE Final   C Diff interpretation Toxin producing C. difficile detected.  Final    Comment: CRITICAL RESULT CALLED TO, READ BACK BY AND VERIFIED WITH: Geronimo Boot RN 16:45 05/30/17 ([email or web address]) Performed at [facility], [place] [address] [place], [place] [address]       Studies: Ct Abdomen Pelvis W Contrast  Result Date: 05/31/2017 CLINICAL DATA:  Abdominal pain, GI bleeding EXAM: CT ABDOMEN AND PELVIS WITH CONTRAST TECHNIQUE: Multidetector CT imaging of the abdomen and pelvis was performed using the standard protocol following bolus administration of intravenous contrast. CONTRAST:  158mL OMNIPAQUE IOHEXOL 300 MG/ML  SOLN COMPARISON:  CT abdomen dated 03/29/2017 FINDINGS: Motion degraded images. Lower chest: Small bilateral pleural effusions. Mild dependent atelectasis in the bilateral lower lobes. Hepatobiliary: Liver is within normal limits. Gallbladder is unremarkable. No intrahepatic or extrahepatic ductal dilatation. Pancreas: Within normal limits.  Spleen: Within normal limits. Adrenals/Urinary Tract: Adrenal glands are within normal limits. Bilateral renal cysts, measuring up to 5.0 cm in the posterior right lower kidney (series 3/image 35). No hydronephrosis. Bladder is within normal limits. Stomach/Bowel: Stomach is notable for a percutaneous gastrostomy in satisfactory position. No evidence of bowel obstruction. Appendix is not discretely visualized. No definite colonic wall thickening is evident on CT, noting motion degradation and streak artifact. Vascular/Lymphatic: No evidence of abdominal aortic aneurysm. Atherosclerotic calcifications of the abdominal aorta and branch vessels. No suspicious abdominopelvic lymphadenopathy. Reproductive: Markedly enlarged uterus with numerous fibroids, many of  which are calcified. No adnexal masses. Other: No abdominopelvic ascites. Musculoskeletal: Mild degenerative changes of the visualized thoracolumbar spine. Streak artifact overlying the pelvis from the patient's left hip prosthesis. IMPRESSION: Limited evaluation secondary to motion degradation and streak artifact. No definite colonic wall thickening on CT. No evidence of bowel obstruction. Small bilateral pleural effusions. Large fibroid uterus. Additional ancillary findings as above. Electronically Signed   By: Julian Hy M.D.   On: 05/31/2017 20:20    Scheduled Meds: . atorvastatin  40 mg Per Tube q1800  . carvedilol  3.125 mg Per Tube BID WC  . clopidogrel  75 mg Per Tube Daily  . feeding supplement (OSMOLITE 1.2 CAL)  237 mL Per Tube QID  . feeding supplement (PRO-STAT SUGAR FREE 64)  30 mL Per Tube Daily  . free water  200 mL Per Tube Q6H  . gabapentin  300 mg Per Tube BID  . mouth rinse  15 mL Mouth Rinse BID  . ranitidine  150 mg Per Tube Daily  . vancomycin  125 mg Per Tube Q6H    Continuous Infusions: . sodium chloride       LOS: 4 days     Kayleen Memos, MD [facility] Pager [phone]  If 7PM-7AM, please  contact night-coverage [email or web address] Password [email or web address] 06/01/2017, 12:41 PM

## 2017-06-01 NOTE — Progress Notes (Addendum)
Nutrition Follow-up  DOCUMENTATION CODES:   Not applicable  INTERVENTION:   Continue 237 ml Osmolite 1.2 QID via PEG   30 ml Prostat daily.    200 ml free water flush every 6 hours  Tube feeding regimen provides 1238 kcal (100% of needs), 68 grams of protein, and 1577 ml of H2O.   NUTRITION DIAGNOSIS:   Inadequate oral intake related to inability to eat as evidenced by NPO status.  Ongoing  GOAL:   Patient will meet greater than or equal to 90% of their needs  Met with TF  MONITOR:   Vent status, Labs, Weight trends, Skin, I & O's  REASON FOR ASSESSMENT:   Consult Enteral/tube feeding initiation and management  ASSESSMENT:   Alyssa Savage is a 84 y.o. female with medical history significant for recent stroke, with residual right-sided deficits, severe dysphagia with PEG in place, history of left hip fracture in February 2019, hypertension among other issues listed below, presenting today from skilled nursing facility with nausea, vomiting, abdominal distention with intermittent watery diarrhea and constipation.   4/27- disimpacted in ED  Reviewed I/O's: -88 ml x 24 hours and +2.4 L since admission.   Per GI notes, RLQ mass likely calcified uterine fibroid.   Case discussed with RN, who reports pt is tolerating TF well. Pt continuing to receive 237 ml Osmolite 1.2 QID, 30 ml Prostat daily, and 200 ml free water flush every 6 hours via PEG. Complete regimen provides 1238 kcals, 68 grams protein, and 1577 ml free water.   Spoke with pt daughter at bedside, who denies wt loss or difficulty tolerating TF PTA. Updated pt daughter on nutrition care plan.   Labs reviewed: CBGS: 96-207 (no current orders for glycemic control).   Diet Order:   Diet Order           Diet NPO time specified  Diet effective now          EDUCATION NEEDS:   Not appropriate for education at this time  Skin:  Skin Assessment: Reviewed RN Assessment  Last BM:  05/30/17  Height:    Ht Readings from Last 1 Encounters:  05/27/17 5' (1.524 m)    Weight:   Wt Readings from Last 1 Encounters:  06/01/17 123 lb 10.9 oz (56.1 kg)    Ideal Body Weight:  45.5 kg  BMI:  Body mass index is 24.15 kg/m.  Estimated Nutritional Needs:   Kcal:  1200-1400  Protein:  60-75 grams  Fluid:  1.2-1.4 L    [name] A. Jimmye Norman, RD, LDN, CDE Pager: [phone] After hours Pager: [phone]

## 2017-06-01 NOTE — Progress Notes (Signed)
   Patient Name: Alyssa Savage Date of Encounter: 06/01/2017, 9:22 AM    Subjective  Stools getting some form per RN, no mention of bleeding on signout She denies pain   Objective  BP 138/86 (BP Location: Right Arm)   Pulse 93   Temp 99.5 F (37.5 C) (Oral)   Resp 18   Ht 5' (1.524 m)   Wt 123 lb 10.9 oz (56.1 kg)   SpO2 97%   BMI 24.15 kg/m  Mute/aphasic Chronically ill abd soft and nontender palpable fibroid RLQ  CT - calcified fibroids and can correlate with PE findings  Assessment and Plan  C diff diarrhea Calcified uterine fibroids Hx rectal bleeding - anemia overall stable Hgb 7.7 today - think bleeding from diarrhea and possible stercoral ulcee  Would treat x 10 d w/ vancomycin 125 mg qid Monitor for more serious bleeding Would try hydrocortisone cream per rectum if needed  We are signing off but can see her back if you need Do not anticipate she needs outpatient GI f/u or any endoscopic evaluation   Gatha Mayer, MD, [facility] 06/01/2017 9:22 AM

## 2017-06-02 LAB — COMPREHENSIVE METABOLIC PANEL
ALT: 79 U/L — AB (ref 14–54)
AST: 56 U/L — ABNORMAL HIGH (ref 15–41)
Albumin: 1.7 g/dL — ABNORMAL LOW (ref 3.5–5.0)
Alkaline Phosphatase: 64 U/L (ref 38–126)
Anion gap: 7 (ref 5–15)
BUN: 24 mg/dL — ABNORMAL HIGH (ref 6–20)
CALCIUM: 7.8 mg/dL — AB (ref 8.9–10.3)
CO2: 21 mmol/L — ABNORMAL LOW (ref 22–32)
CREATININE: 0.81 mg/dL (ref 0.44–1.00)
Chloride: 115 mmol/L — ABNORMAL HIGH (ref 101–111)
GFR calc non Af Amer: >60 mL/min (ref >60)
Glucose, Bld: 104 mg/dL — ABNORMAL HIGH (ref 65–99)
Potassium: 3.6 mmol/L (ref 3.5–5.1)
Sodium: 143 mmol/L (ref 135–145)
TOTAL PROTEIN: 5.2 g/dL — AB (ref 6.5–8.1)
Total Bilirubin: 0.3 mg/dL (ref 0.3–1.2)

## 2017-06-02 LAB — GLUCOSE, CAPILLARY
GLUCOSE-CAPILLARY: 104 mg/dL — AB (ref 65–99)
GLUCOSE-CAPILLARY: 103 mg/dL — AB (ref 65–99)
GLUCOSE-CAPILLARY: 137 mg/dL — AB (ref 65–99)
Glucose-Capillary: 204 mg/dL — ABNORMAL HIGH (ref 65–99)
Glucose-Capillary: 106 mg/dL — ABNORMAL HIGH (ref 65–99)
Glucose-Capillary: 90 mg/dL (ref 65–99)

## 2017-06-02 LAB — CBC
HCT: 25 % — ABNORMAL LOW (ref 36.0–46.0)
HEMOGLOBIN: 7.9 g/dL — AB (ref 12.0–15.0)
MCH: 27.7 pg (ref 26.0–34.0)
MCHC: 31.6 g/dL (ref 30.0–36.0)
MCV: 87.7 fL (ref 78.0–100.0)
Platelets: 364 10*3/uL (ref 150–400)
RBC: 2.85 MIL/uL — AB (ref 3.87–5.11)
RDW: 16.8 % — ABNORMAL HIGH (ref 11.5–15.5)
WBC: 8.3 10*3/uL (ref 4.0–10.5)

## 2017-06-02 NOTE — Clinical Social Work Note (Signed)
Clinical Social Work Assessment  Patient Details  Name: Alyssa Savage MRN: [id number] Date of Birth: May 12, 1930  Date of referral:  06/02/17               Reason for consult:  Facility Placement                Permission sought to share information with:  Facility Sport and exercise psychologist, Family Supports Permission granted to share information::  No(pt nonverbal at baseline)  Name::     Ewing Cromartie & Hulda Humphrey  Agency::  [facility]  Relationship::  daughter and son   Contact Information:  [phone] & [phone]  Housing/Transportation Living arrangements for the past 2 months:  [place] of Information:  Adult Children Patient Interpreter Needed:  None Criminal Activity/Legal Involvement Pertinent to Current Situation/Hospitalization:  No - Comment as needed Significant Relationships:  Adult Children, Other Family Members Lives with:  Facility Resident Do you feel safe going back to the place where you live?  Yes Need for family participation in patient care:  Yes (Comment)(decision making; care decisions)  Care giving concerns:  Pt has been receiving care at [facility], pt children are happy with facility and feel it is best plan as they cannot manage needs by themselves. Pt is nonverbal, has right sided paralysis and receives bolus tube feeds.   Social Worker assessment / plan:  CSW spoke with pt son, he stated he was currently on the golf [place], requested CSW call his sister, [name]. CSW reached [place] via telephone. Pt daughter states that she is knowledgeable about the SNF process and would prefer her mother return to [facility] when medically appropriate. Pt daughter didn't state any further concerns, feels confident that her mother will continue to be taken care of, as she has both Medicaid and Medicare.   CSW sent referral to [facility], alerted them of new contact precautions and likely discharge.   Employment status:   Retired Forensic scientist:  [place]icaid [place], [employer] PT Recommendations:  [place], [place] / Referral to community resources:  [place]  Patient/Family's Response to care:  CSW spoke with pt children via telephone, they are agreeable to SNF and would like pt to return to [place].   Patient/Family's Understanding of and Emotional Response to Diagnosis, Current Treatment, and Prognosis:  Pt daughter states understanding of diagnosis, current treatment and prognosis. Pt daughter very emotionally even during conversation and knowledgeable about mothers needs.   Emotional Assessment Appearance:  Appears stated age Attitude/Demeanor/Rapport:  Unable to Assess Affect (typically observed):  Unable to Assess Orientation:  (pt non verbal, alert ) Alcohol / Substance use:  Not Applicable Psych involvement (Current and /or in the community):  No (Comment)  Discharge Needs  Concerns to be addressed:  Care Coordination, Discharge Planning Concerns Readmission within the last 30 days:  Yes Current discharge risk:  Physical Impairment, Cognitively Impaired, Dependent with Mobility Barriers to Discharge:  Continued Medical Work up, [employer], [place] 06/02/2017, 2:30 PM

## 2017-06-02 NOTE — Progress Notes (Signed)
PROGRESS NOTE  Alyssa Savage IWP:[id number] DOB: July 12, 1930 DOA: 05/27/2017 PCP: Patient, No Pcp Per  HPI/Recap of past 24 hours: 84 y.o.femalewith medical history significant forrecent stroke, with residual right-sided deficits, severe dysphagia with PEG in place, history of left hip fracture in February 2019, hypertension among other issues listed below, presenting today from skilled nursing facility with nausea, vomiting, abdominal distention with intermittent watery diarrhea and constipation. The patient is unable to speak due to recent stroke, and at the facility was less interactive (however here at the emergency department is more alert and able to interact).In review, the patient had been diagnosed with a UTI during last admission, cultures positive for fungus, and had been placed on Diflucan per tube. Today, her UA continues to be positive for fungus in urine, with cultures pending. Thus, she is being admitted for the treatment of UTI, as well as management of her GI symptoms. No documented fevers. No apparent shortness of breath or chest pain. She appears very weak and fatigued. No apparent bleeding issues.  05/30/17: Patient seen and examined at her bedside.  She is aphasic.  Unable to obtain review of systems from her.  Called her daughter who Is also medical POA [phone] to update on current medical condition.  Per RN patient had blood in her stool -ordered FOBT.  Hematuria also noted.  GI consulted.  If hematuria is persistent we will consult urology.  Urine analysis positive for RBCs.  No sign of kidney stones on abd u/s.CT may be considered to rule out stones.  05/31/2017: Patient seen and examined at her bedside.  She is alert in the setting of aphasia.  C. difficile positive.  Started on oral vancomycin on 05/30/2017.  GI following.  Pain on palpation of right lower quadrant.  06/01/2017: Patient seen and examined with her daughter at bedside.  Daughter expresses that  the patient is no back to her baseline.  We will continue oral vancomycin and gentle IV fluid hydration.  06/02/17: Seen and examined with her daughter at bedside. No new complaints. She is more alert today and communicates with gestures.  Had 3 bowel movements since 5:00 this morning.  We will continue gentle IV fluid along with her PEG tube feeding with possible discharge tomorrow to SNF.  Assessment/Plan: Principal Problem:   UTI (urinary tract infection) Active Problems:   GERD (gastroesophageal reflux disease)   Stroke (HCC)   Prolonged Q-T interval on ECG   Protein-calorie malnutrition, severe   Dysphagia   CKD (chronic kidney disease), stage III (HCC)   Nausea and vomiting   Clostridium difficile diarrhea   Uterine leiomyoma   Rectal bleeding  C. difficile colitis, stable C. difficile colitis is improving Positive C. difficile 01/29/2018 Continue oral vancomycin 125 mg 4 times daily Day#3/14 days (05/30/2017) CT abdomen pelvis with contrast revealed no definite colonic wall thickening and no evidence of bowel obstruction.  Showed a large fibroid uterus.  GI bleed, stable Hemoglobin is stable FOBT positive on 05/30/2017 GI consulted and following On Zantac Continue to monitor CBC  Dysphagia PEG tube feeding stable Continue PEG tube feeding Continue to monitor flushes  Dehydration Continue gentle IV hydration 75 cc/h of normal saline Continue free water flushes with PEG tube  Large fibroid uterus Follow up outpatient  Intractable nausea, resolved  Acute blood loss anemia, stable Suspect from GI bleed versus hematuria Continue to monitor CBC  Worsening transaminitis No sign of cholecystitis on abdominal ultrasound LFTs continue to trend up CT abdomen pelvis with  contrast ordered Follow-up with PCP outpatient Denies right upper quadrant pain or tenderness on palpation  Hematuria Hematuria has resolved Abdominal ultrasound done on 05/28/2017 revealed right  renal cyst measuring about 4.8 cm Continue gentle IV hydration Continue to monitor urine output  Severe protein calorie malnutrition Albumin 1.7 Continue peg tube feeding   Code Status: Full code  Family Communication: Daughter who is also medical POA on the phone on 05/30/2017.  On 05/31/2017 called daughter to update on current medical condition and left message on the phone. Spoke in [place] with daughter on 06/01/2017 and 06/02/17 to update current medical condition.  Disposition Plan: SNF possibly tomorrow 06/03/2017.   Consultants:  GI  Procedures:  None  Antimicrobials:  None  DVT prophylaxis: SCDs   Objective: Vitals:   06/01/17 0658 06/01/17 1409 06/01/17 2013 06/02/17 0400  BP: 138/86 133/83 129/84 (!) 144/71  Pulse: 93 88 99 97  Resp: 18 17 17 16   Temp: 99.5 F (37.5 C) 97.7 F (36.5 C) 97.7 F (36.5 C) 99.1 F (37.3 C)  TempSrc: Oral Oral Oral Oral  SpO2: 97% 100% 99% 97%  Weight: 56.1 kg (123 lb 10.9 oz)     Height:        Intake/Output Summary (Last 24 hours) at 06/02/2017 1505 Last data filed at 06/02/2017 1244 Gross per 24 hour  Intake 237 ml  Output -  Net 237 ml   Filed Weights   05/29/17 0600 05/31/17 0450 06/01/17 0658  Weight: 54.8 kg (120 lb 13 oz) 53.1 kg (117 lb 1 oz) 56.1 kg (123 lb 10.9 oz)    Exam:   General: 36-year-old African-American female well developed well-nourished in no acute distress.  Alert in the setting of aphasia.    Cardiovascular: Regular rate and rhythm with no rubs or gallops.  No JVD or thyromegaly present.    Respiratory: Mild rales at bases.  No wheezes noted.  Poor inspiratory effort.  Abdomen: Tenderness on palpation of right lower quadrant.  No distention, normal bowel sounds x4 quadrant and PEG tube in place  Musculoskeletal: Trace lower extremity edema right lower extremity appears contracted  Skin: No ulcerative lesions.  Psychiatry: Unable to assess due to aphasia.   Data Reviewed: CBC: Recent  Labs  Lab 05/27/17 2346 05/29/17 0745 05/30/17 0756 05/31/17 0341 06/01/17 1009 06/02/17 0454  WBC 13.9* 10.1 7.5 8.8 8.7 8.3  NEUTROABS 11.2*  --   --   --   --   --   HGB 10.1* 8.5* 8.0* 7.7* 7.9* 7.9*  HCT 32.4* 28.1* 25.4* 24.4* 24.6* 25.0*  MCV 88.3 90.1 89.8 88.1 86.6 87.7  PLT 377 326 346 364 374 914   Basic Metabolic Panel: Recent Labs  Lab 05/27/17 2346 05/29/17 0745 05/30/17 0756 05/31/17 0341 06/02/17 0454  NA 140 142 141 143 143  K 3.4* 3.3* 3.7 3.6 3.6  CL 106 112* 112* 113* 115*  CO2 25 23 21* 21* 21*  GLUCOSE 134* 81 96 112* 104*  BUN 36* 29* 25* 23* 24*  CREATININE 0.94 0.79 0.71 0.75 0.81  CALCIUM 8.8* 8.0* 8.0* 8.2* 7.8*   GFR: Estimated Creatinine Clearance: 40.6 mL/min (by C-G formula based on SCr of 0.81 mg/dL). Liver Function Tests: Recent Labs  Lab 05/27/17 2346 05/30/17 0756 05/31/17 0341 06/02/17 0454  AST 37 48* 56* 56*  ALT 55* 61* 68* 79*  ALKPHOS 87 69 64 64  BILITOT 0.4 0.3 0.4 0.3  PROT 6.5 5.1* 4.9* 5.2*  ALBUMIN 2.4* 1.7* 1.8*  1.7*   Recent Labs  Lab 05/27/17 2346  LIPASE 72*   No results for input(s): AMMONIA in the last 168 hours. Coagulation Profile: Recent Labs  Lab 05/27/17 2346  INR 1.12   Cardiac Enzymes: Recent Labs  Lab 05/27/17 2346 05/28/17 0533  TROPONINI 0.04* <0.03   BNP (last 3 results) No results for input(s): PROBNP in the last 8760 hours. HbA1C: No results for input(s): HGBA1C in the last 72 hours. CBG: Recent Labs  Lab 06/01/17 1948 06/01/17 2351 06/02/17 0327 06/02/17 0726 06/02/17 1155  GLUCAP 173* 204* 104* 106* 90   Lipid Profile: No results for input(s): CHOL, HDL, LDLCALC, TRIG, CHOLHDL, LDLDIRECT in the last 72 hours. Thyroid Function Tests: No results for input(s): TSH, T4TOTAL, FREET4, T3FREE, THYROIDAB in the last 72 hours. Anemia Panel: No results for input(s): VITAMINB12, FOLATE, FERRITIN, TIBC, IRON, RETICCTPCT in the last 72 hours. Urine analysis:    Component  Value Date/Time   COLORURINE YELLOW 05/28/2017 0449   APPEARANCEUR HAZY (A) 05/28/2017 0449   LABSPEC 1.016 05/28/2017 0449   PHURINE 5.0 05/28/2017 0449   GLUCOSEU NEGATIVE 05/28/2017 0449   HGBUR NEGATIVE 05/28/2017 0449   BILIRUBINUR NEGATIVE 05/28/2017 0449   KETONESUR NEGATIVE 05/28/2017 0449   PROTEINUR NEGATIVE 05/28/2017 0449   NITRITE NEGATIVE 05/28/2017 0449   LEUKOCYTESUR LARGE (A) 05/28/2017 0449   Sepsis Labs: @LABRCNTIP (procalcitonin:4,lacticidven:4)  ) Recent Results (from the past 240 hour(s))  Urine Culture     Status: Abnormal   Collection Time: 05/28/17  4:50 AM  Result Value Ref Range Status   Specimen Description URINE, RANDOM  Final   Special Requests   Final    NONE Performed at [facility], [place] [address], [place], [place] [address]    Culture >=100,000 COLONIES/mL YEAST (A)  Final   Report Status 05/29/2017 FINAL  Final  MRSA PCR Screening     Status: None   Collection Time: 05/28/17  5:09 PM  Result Value Ref Range Status   MRSA by PCR NEGATIVE NEGATIVE Final    Comment:        The GeneXpert MRSA Assay (FDA approved for NASAL specimens only), is one component of a comprehensive MRSA colonization surveillance program. It is not intended to diagnose MRSA infection nor to guide or monitor treatment for MRSA infections. Performed at [facility], [place] [address], [place], [place] [address]   C difficile quick scan w PCR reflex     Status: Abnormal   Collection Time: 05/30/17  3:31 PM  Result Value Ref Range Status   C Diff antigen POSITIVE (A) NEGATIVE Final   C Diff toxin POSITIVE (A) NEGATIVE Final   C Diff interpretation Toxin producing C. difficile detected.  Final    Comment: CRITICAL RESULT CALLED TO, READ BACK BY AND VERIFIED WITH: Geronimo Boot RN 16:45 05/30/17 ([email or web address]) Performed at [facility], [place] [address] [place], [place] [address]       Studies: No results found.  Scheduled Meds: . atorvastatin  40 mg  Per Tube q1800  . carvedilol  3.125 mg Per Tube BID WC  . clopidogrel  75 mg Per Tube Daily  . feeding supplement (OSMOLITE 1.2 CAL)  237 mL Per Tube QID  . feeding supplement (PRO-STAT SUGAR FREE 64)  30 mL Per Tube Daily  . free water  200 mL Per Tube Q6H  . gabapentin  300 mg Per Tube BID  . mouth rinse  15 mL Mouth Rinse BID  .  ranitidine  150 mg Per Tube Daily  . vancomycin  125 mg Per Tube Q6H    Continuous Infusions: . sodium chloride 75 mL/hr at 06/01/17 1449     LOS: 5 days     Kayleen Memos, MD [facility] Pager [phone]  If 7PM-7AM, please contact night-coverage [email or web address] Password [email or web address] 06/02/2017, 3:05 PM

## 2017-06-03 LAB — GLUCOSE, CAPILLARY
GLUCOSE-CAPILLARY: 151 mg/dL — AB (ref 65–99)
GLUCOSE-CAPILLARY: 156 mg/dL — AB (ref 65–99)
GLUCOSE-CAPILLARY: 77 mg/dL (ref 65–99)
Glucose-Capillary: 102 mg/dL — ABNORMAL HIGH (ref 65–99)
Glucose-Capillary: 110 mg/dL — ABNORMAL HIGH (ref 65–99)
Glucose-Capillary: 143 mg/dL — ABNORMAL HIGH (ref 65–99)
Glucose-Capillary: 182 mg/dL — ABNORMAL HIGH (ref 65–99)

## 2017-06-03 NOTE — Progress Notes (Signed)
PROGRESS NOTE  Alyssa Savage ZDG:[id number] DOB: 09/30/1930 DOA: 05/27/2017 PCP: Patient, No Pcp Per  HPI/Recap of past 24 hours: 84 y.o.femalewith medical history significant forrecent stroke, with residual right-sided deficits, severe dysphagia with PEG in place, history of left hip fracture in February 2019, hypertension among other issues listed below, presenting today from skilled nursing facility with nausea, vomiting, abdominal distention with intermittent watery diarrhea and constipation. The patient is unable to speak due to recent stroke, and at the facility was less interactive (however here at the emergency department is more alert and able to interact).In review, the patient had been diagnosed with a UTI during last admission, cultures positive for fungus, and had been placed on Diflucan per tube. Today, her UA continues to be positive for fungus in urine, with cultures pending. Thus, she is being admitted for the treatment of UTI, as well as management of her GI symptoms. No documented fevers. No apparent shortness of breath or chest pain. She appears very weak and fatigued. No apparent bleeding issues.  05/30/17: Patient seen and examined at her bedside.  She is aphasic.  Unable to obtain review of systems from her.  Called her daughter who Is also medical POA [phone] to update on current medical condition.  Per RN patient had blood in her stool -ordered FOBT.  Hematuria also noted.  GI consulted.  If hematuria is persistent we will consult urology.  Urine analysis positive for RBCs.  No sign of kidney stones on abd u/s.CT may be considered to rule out stones.  05/31/2017: Patient seen and examined at her bedside.  She is alert in the setting of aphasia.  C. difficile positive.  Started on oral vancomycin on 05/30/2017.  GI following.  Pain on palpation of right lower quadrant.  06/01/2017: Patient seen and examined with her daughter at bedside.  Daughter expresses that  the patient is no back to her baseline.  We will continue oral vancomycin and gentle IV fluid hydration.  06/02/17: Seen and examined with her daughter at bedside. No new complaints. She is more alert today and communicates with gestures.  Had 3 bowel movements since 5:00 this morning.  We will continue gentle IV fluid along with her PEG tube feeding with possible discharge tomorrow to SNF.  06/03/17: No new complaints. Awaiting bed placement to SNF.  Assessment/Plan: Principal Problem:   UTI (urinary tract infection) Active Problems:   GERD (gastroesophageal reflux disease)   Stroke (HCC)   Prolonged Q-T interval on ECG   Protein-calorie malnutrition, severe   Dysphagia   CKD (chronic kidney disease), stage III (HCC)   Nausea and vomiting   Clostridium difficile diarrhea   Uterine leiomyoma   Rectal bleeding  C. difficile colitis, stable C. difficile colitis is improving Positive C. difficile 01/29/2018 Continue oral vancomycin 125 mg 4 times daily Day#3/14 days (05/30/2017) CT abdomen pelvis with contrast revealed no definite colonic wall thickening and no evidence of bowel obstruction.  Showed a large fibroid uterus.  GI bleed, stable Hemoglobin is stable FOBT positive on 05/30/2017 GI consulted and following On Zantac Continue to monitor CBC  Dysphagia PEG tube feeding stable Continue PEG tube feeding Continue to monitor flushes  Dehydration Continue gentle IV hydration 75 cc/h of normal saline Continue free water flushes with PEG tube  Large fibroid uterus Follow up outpatient  Intractable nausea, resolved  Acute blood loss anemia, stable Suspect from GI bleed versus hematuria Continue to monitor CBC  Worsening transaminitis No sign of cholecystitis on abdominal  ultrasound LFTs continue to trend up CT abdomen pelvis with contrast ordered Follow-up with PCP outpatient Denies right upper quadrant pain or tenderness on palpation  Hematuria Hematuria has  resolved Abdominal ultrasound done on 05/28/2017 revealed right renal cyst measuring about 4.8 cm Continue gentle IV hydration Continue to monitor urine output  Severe protein calorie malnutrition Albumin 1.7 Continue peg tube feeding   Code Status: Full code  Family Communication: Daughter who is also medical POA on the phone on 05/30/2017.  On 05/31/2017 called daughter to update on current medical condition and left message on the phone. Spoke in [place] with daughter on 06/01/2017 and 06/02/17 to update current medical condition.  Disposition Plan: SNF possibly tomorrow 06/03/2017.   Consultants:  GI  Procedures:  None  Antimicrobials:  None  DVT prophylaxis: SCDs   Objective: Vitals:   06/03/17 0410 06/03/17 0500 06/03/17 0600 06/03/17 1445  BP: (!) 161/91  (!) 144/88 (!) 152/93  Pulse: 85  82 87  Resp: 16   19  Temp: 98.4 F (36.9 C)   98.2 F (36.8 C)  TempSrc: Axillary   Oral  SpO2: 95%   98%  Weight:  58.8 kg (129 lb 10.1 oz)    Height:        Intake/Output Summary (Last 24 hours) at 06/03/2017 1847 Last data filed at 06/03/2017 1700 Gross per 24 hour  Intake 1030 ml  Output 1500 ml  Net -470 ml   Filed Weights   05/31/17 0450 06/01/17 0658 06/03/17 0500  Weight: 53.1 kg (117 lb 1 oz) 56.1 kg (123 lb 10.9 oz) 58.8 kg (129 lb 10.1 oz)    Exam:   General: 84 yo AAF WD WN NAD. Alert in the setting of aphasia.  Cardiovascular: RRR No rubs or gallops. No JVD or thyromegaly.  Respiratory: Mild rales at bases.  No wheezes noted.  Poor inspiratory effort.  Abdomen: Tenderness on palpation of right lower quadrant.  No distention, normal bowel sounds x4 quadrant and PEG tube in place  Musculoskeletal: Trace lower extremity edema right lower extremity appears contracted  Skin: No ulcerative lesions.  Psychiatry: Unable to assess due to aphasia.   Data Reviewed: CBC: Recent Labs  Lab 05/27/17 2346 05/29/17 0745 05/30/17 0756 05/31/17 0341  06/01/17 1009 06/02/17 0454  WBC 13.9* 10.1 7.5 8.8 8.7 8.3  NEUTROABS 11.2*  --   --   --   --   --   HGB 10.1* 8.5* 8.0* 7.7* 7.9* 7.9*  HCT 32.4* 28.1* 25.4* 24.4* 24.6* 25.0*  MCV 88.3 90.1 89.8 88.1 86.6 87.7  PLT 377 326 346 364 374 299   Basic Metabolic Panel: Recent Labs  Lab 05/27/17 2346 05/29/17 0745 05/30/17 0756 05/31/17 0341 06/02/17 0454  NA 140 142 141 143 143  K 3.4* 3.3* 3.7 3.6 3.6  CL 106 112* 112* 113* 115*  CO2 25 23 21* 21* 21*  GLUCOSE 134* 81 96 112* 104*  BUN 36* 29* 25* 23* 24*  CREATININE 0.94 0.79 0.71 0.75 0.81  CALCIUM 8.8* 8.0* 8.0* 8.2* 7.8*   GFR: Estimated Creatinine Clearance: 41.5 mL/min (by C-G formula based on SCr of 0.81 mg/dL). Liver Function Tests: Recent Labs  Lab 05/27/17 2346 05/30/17 0756 05/31/17 0341 06/02/17 0454  AST 37 48* 56* 56*  ALT 55* 61* 68* 79*  ALKPHOS 87 69 64 64  BILITOT 0.4 0.3 0.4 0.3  PROT 6.5 5.1* 4.9* 5.2*  ALBUMIN 2.4* 1.7* 1.8* 1.7*   Recent Labs  Lab 05/27/17  2346  LIPASE 72*   No results for input(s): AMMONIA in the last 168 hours. Coagulation Profile: Recent Labs  Lab 05/27/17 2346  INR 1.12   Cardiac Enzymes: Recent Labs  Lab 05/27/17 2346 05/28/17 0533  TROPONINI 0.04* <0.03   BNP (last 3 results) No results for input(s): PROBNP in the last 8760 hours. HbA1C: No results for input(s): HGBA1C in the last 72 hours. CBG: Recent Labs  Lab 06/03/17 0006 06/03/17 0405 06/03/17 0754 06/03/17 1158 06/03/17 1606  GLUCAP 156* 102* 77 110* 143*   Lipid Profile: No results for input(s): CHOL, HDL, LDLCALC, TRIG, CHOLHDL, LDLDIRECT in the last 72 hours. Thyroid Function Tests: No results for input(s): TSH, T4TOTAL, FREET4, T3FREE, THYROIDAB in the last 72 hours. Anemia Panel: No results for input(s): VITAMINB12, FOLATE, FERRITIN, TIBC, IRON, RETICCTPCT in the last 72 hours. Urine analysis:    Component Value Date/Time   COLORURINE YELLOW 05/28/2017 0449   APPEARANCEUR HAZY  (A) 05/28/2017 0449   LABSPEC 1.016 05/28/2017 0449   PHURINE 5.0 05/28/2017 0449   GLUCOSEU NEGATIVE 05/28/2017 0449   HGBUR NEGATIVE 05/28/2017 0449   BILIRUBINUR NEGATIVE 05/28/2017 0449   KETONESUR NEGATIVE 05/28/2017 0449   PROTEINUR NEGATIVE 05/28/2017 0449   NITRITE NEGATIVE 05/28/2017 0449   LEUKOCYTESUR LARGE (A) 05/28/2017 0449   Sepsis Labs: @LABRCNTIP (procalcitonin:4,lacticidven:4)  ) Recent Results (from the past 240 hour(s))  Urine Culture     Status: Abnormal   Collection Time: 05/28/17  4:50 AM  Result Value Ref Range Status   Specimen Description URINE, RANDOM  Final   Special Requests   Final    NONE Performed at [facility], [place] [address] [place], [place] [address]    Culture >=100,000 COLONIES/mL YEAST (A)  Final   Report Status 05/29/2017 FINAL  Final  MRSA PCR Screening     Status: None   Collection Time: 05/28/17  5:09 PM  Result Value Ref Range Status   MRSA by PCR NEGATIVE NEGATIVE Final    Comment:        The GeneXpert MRSA Assay (FDA approved for NASAL specimens only), is one component of a comprehensive MRSA colonization surveillance program. It is not intended to diagnose MRSA infection nor to guide or monitor treatment for MRSA infections. Performed at [facility], [place] [address] [place], [place] [address]   C difficile quick scan w PCR reflex     Status: Abnormal   Collection Time: 05/30/17  3:31 PM  Result Value Ref Range Status   C Diff antigen POSITIVE (A) NEGATIVE Final   C Diff toxin POSITIVE (A) NEGATIVE Final   C Diff interpretation Toxin producing C. difficile detected.  Final    Comment: CRITICAL RESULT CALLED TO, READ BACK BY AND VERIFIED WITH: Geronimo Boot RN 16:45 05/30/17 ([email or web address]) Performed at [facility], [place] [address] [place], [place] [address]       Studies: No results found.  Scheduled Meds: . atorvastatin  40 mg Per Tube q1800  . carvedilol  3.125 mg Per Tube BID WC  . clopidogrel  75  mg Per Tube Daily  . feeding supplement (OSMOLITE 1.2 CAL)  237 mL Per Tube QID  . feeding supplement (PRO-STAT SUGAR FREE 64)  30 mL Per Tube Daily  . free water  200 mL Per Tube Q6H  . gabapentin  300 mg Per Tube BID  . mouth rinse  15 mL Mouth Rinse BID  . ranitidine  150 mg Per Tube Daily  .  vancomycin  125 mg Per Tube Q6H    Continuous Infusions: . sodium chloride 75 mL/hr at 06/03/17 0600     LOS: 6 days     Kayleen Memos, MD [facility] Pager [phone]  If 7PM-7AM, please contact night-coverage [email or web address] Password [email or web address] 06/03/2017, 6:47 PM

## 2017-06-03 NOTE — Social Work (Addendum)
CSW spoke with SNF, due to pt testing positive for cdiff pt will require private room at [facility]. [facility] is checking availability.   11:13am- [facility] will have private bed tomorrow for pt, MD aware.   Alexander Mt, [place] Work [phone]

## 2017-06-04 LAB — GLUCOSE, CAPILLARY
GLUCOSE-CAPILLARY: 99 mg/dL (ref 65–99)
GLUCOSE-CAPILLARY: 88 mg/dL (ref 65–99)
GLUCOSE-CAPILLARY: 88 mg/dL (ref 65–99)
GLUCOSE-CAPILLARY: 133 mg/dL — AB (ref 65–99)
Glucose-Capillary: 126 mg/dL — ABNORMAL HIGH (ref 65–99)
Glucose-Capillary: 136 mg/dL — ABNORMAL HIGH (ref 65–99)

## 2017-06-04 MED ORDER — WHITE PETROLATUM EX OINT
TOPICAL_OINTMENT | CUTANEOUS | Status: AC
  Administered 2017-06-04: 18:00:00
  Filled 2017-06-04: qty 28.35

## 2017-06-04 MED ORDER — BISACODYL 10 MG RE SUPP: 10.0000 mg | Freq: Every day | RECTAL | 0 refills | Status: AC | PRN

## 2017-06-04 MED ORDER — VANCOMYCIN 50 MG/ML ORAL SOLUTION: 125.0000 mg | Freq: Four times a day (QID) | ORAL | 0 refills | Status: AC

## 2017-06-04 MED ORDER — RANITIDINE HCL 150 MG/10ML PO SYRP: 150.0000 mg | ORAL_SOLUTION | Freq: Every day | ORAL | 0 refills | Status: DC

## 2017-06-04 NOTE — Social Work (Signed)
CSW has spoken with [facility], they state that pt who is in the room into which the pt was going to be discharged has not left and they are unable to take pt until tomorrow.   CSW spoke with pt daughter [name], she is aware. She knows that if pt is unable to discharge to [facility] tomorrow she will have to select another SNF option.  Hand off left for CSW to f/u with facilities for availability and pt daughter [name].  Alexander Mt, [place] Work [phone]

## 2017-06-04 NOTE — Discharge Summary (Addendum)
Discharge Summary  Alyssa Savage WEX:[id number] DOB: 17-Dec-1930  PCP: Patient, No Pcp Per  Admit date: 05/27/2017 Discharge date: 06/04/2017  Time spent: 25 mins   Update: Discharge delayed due to no beds available at SNF.  Recommendations for Outpatient Follow-up:  1. Follow-up with PCP 2. Take medications as prescribed 3. Fall precautions 4. Repeat CBC on 06/06/2017 to check hemoglobin level.  Discharge Diagnoses:  Active Hospital Problems   Diagnosis Date Noted  . UTI (urinary tract infection) 04/19/2017  . Clostridium difficile diarrhea   . Uterine leiomyoma   . Rectal bleeding   . Nausea and vomiting 05/28/2017  . CKD (chronic kidney disease), stage III ([place]) 04/20/2017  . Dysphagia 04/19/2017  . Protein-calorie malnutrition, severe 03/24/2017  . Stroke ([place]) 03/21/2017  . GERD (gastroesophageal reflux disease)   . Prolonged Q-T interval on ECG     Resolved Hospital Problems  No resolved problems to display.    Discharge Condition: Stable  Diet recommendation: Continue PEG tube feeding  Vitals:   06/03/17 2014 06/04/17 0640  BP: (!) 148/95 (!) 153/95  Pulse: 91 96  Resp: 18 17  Temp: 98.4 F (36.9 C) 99 F (37.2 C)  SpO2: 100% 98%    History of present illness:  84 y.o.femalewith medical history significant forrecent stroke, with residual right-sided deficits, severe dysphagia with PEG in place, history of left hip fracture in February 2019, hypertension among other issues listed below, presenting today from skilled nursing facility with nausea, vomiting, abdominal distention with intermittent watery diarrhea and constipation. The patient is unable to speak due to recent stroke, and at the facility was less interactive (however here at the emergency department is more alert and able to interact).In review, the patient had been diagnosed with a UTI during last admission, cultures positive for fungus, and had been placed on Diflucan per tube. Today, her  UA continues to be positive for fungus in urine, with cultures pending. Thus, she is being admitted for the treatment of UTI, as well as management of her GI symptoms. No documented fevers. No apparent shortness of breath or chest pain. She appears very weak and fatigued. No apparent bleeding issues.  05/30/17: Patient seen and examined at her bedside.  She is aphasic.  Unable to obtain review of systems from her.  Called her daughter who Is also medical POA [phone] to update on current medical condition.  Per RN patient had blood in her stool -ordered FOBT.  Hematuria also noted.  GI consulted.  If hematuria is persistent we will consult urology.  Urine analysis positive for RBCs.  No sign of kidney stones on abd u/s.CT may be considered to rule out stones.  05/31/2017: Patient seen and examined at her bedside.  She is alert in the setting of aphasia.  C. difficile positive.  Started on oral vancomycin on 05/30/2017.  GI following.  Pain on palpation of right lower quadrant.  06/01/2017: Patient seen and examined with her daughter at bedside.  Daughter expresses that the patient is no back to her baseline.  We will continue oral vancomycin and gentle IV fluid hydration.  06/02/17: Seen and examined with her daughter at bedside. No new complaints. She is more alert today and communicates with gestures.  Had 3 bowel movements since 5:00 this morning.  We will continue gentle IV fluid along with her PEG tube feeding with possible discharge tomorrow to SNF.  06/03/17: No new complaints. Awaiting bed placement to SNF.  06/04/17: Seen and examined at bedside.  No new complaints.  On the day of discharge patient was hemodynamically stable. She will need to follow-up with PCP post hospitalization.  Hospital Course:  Principal Problem:   UTI (urinary tract infection) Active Problems:   GERD (gastroesophageal reflux disease)   Stroke ([facility])   Prolonged Q-T interval on ECG   Protein-calorie  malnutrition, severe   Dysphagia   CKD (chronic kidney disease), stage III ([facility])   Nausea and vomiting   Clostridium difficile diarrhea   Uterine leiomyoma   Rectal bleeding  C. difficile colitis, stable C. difficile colitis is improving Positive C. difficile 01/29/2018 Continue oral vancomycin 125 mg 4 times daily Day#4/14 days (05/30/2017) CT abdomen pelvis with contrast revealed no definite colonic wall thickening and no evidence of bowel obstruction.  Showed a large fibroid uterus.  Suspected GI bleed, resolved Hemoglobin is stable FOBT positive on 05/30/2017 On Zantac GI consulted recommended treating C. Difficile Hemoglobin stable at 7.9  Dysphagia PEG tube feeding stable Continue PEG tube feeding Continue to monitor flushes  Dehydration Continue gentle IV hydration 75 cc/h of normal saline Continue free water flushes with PEG tube  Large fibroid uterus Follow up outpatient  Intractable nausea, resolved  Chronic normocytic anemia Obtain iron studies outpatient Hemoglobin stable at 7.9 Baseline hemoglobin 8.6  Worsening transaminitis No sign of cholecystitis on abdominal ultrasound LFTs continue to trend up CT abdomen pelvis with contrast ordered Follow-up with PCP outpatient Denies right upper quadrant pain or tenderness on palpation  Hematuria Hematuria has resolved Abdominal ultrasound done on 05/28/2017 revealed right renal cyst measuring about 4.8 cm Continue gentle IV hydration Continue to monitor urine output  Severe protein calorie malnutrition Albumin 1.7 Continue peg tube feeding      Procedures: None Consultations:  GI  Discharge Exam: BP (!) 153/95 (BP Location: Right Arm)   Pulse 96   Temp 99 F (37.2 C) (Oral)   Resp 17   Ht 5' (1.524 m)   Wt 59.6 kg (131 lb 6.3 oz)   SpO2 98%   BMI 25.66 kg/m    . General: 84 y.o. year-old female well developed well nourished in no acute distress.  Alert in the setting of a  aphasia. . Cardiovascular: Regular rate and rhythm with no rubs or gallops.  No thyromegaly or JVD noted.   Marland Kitchen Respiratory: Clear to auscultation with no wheezes or rales. Good inspiratory effort. . Abdomen: Soft nontender nondistended with normal bowel sounds x4 quadrants. . Musculoskeletal: No lower extremity edema. 2/4 pulses in all 4 extremities. Marland Kitchen Psychiatry: Mood is appropriate for condition and setting  Discharge Instructions You were cared for by a hospitalist during your hospital stay. If you have any questions about your discharge medications or the care you received while you were in the hospital after you are discharged, you can call the unit and asked to speak with the hospitalist on call if the hospitalist that took care of you is not available. Once you are discharged, your primary care physician will handle any further medical issues. Please note that NO REFILLS for any discharge medications will be authorized once you are discharged, as it is imperative that you return to your primary care physician (or establish a relationship with a primary care physician if you do not have one) for your aftercare needs so that they can reassess your need for medications and monitor your lab values.   Allergies as of 06/04/2017   No Known Allergies     Medication List    STOP  taking these medications   enoxaparin 30 MG/0.3ML injection Commonly known as:  LOVENOX   HYDROcodone-acetaminophen 5-325 MG tablet Commonly known as:  NORCO/VICODIN   loperamide 1 MG/5ML solution Commonly known as:  IMODIUM   MUCINEX FAST-MAX CONGEST COUGH 2.5-5-100 MG/5ML Liqd Generic drug:  Phenylephrine-DM-GG     TAKE these medications   atorvastatin 40 MG tablet Commonly known as:  LIPITOR Place 1 tablet (40 mg total) into feeding tube daily at 6 PM.   bisacodyl 10 MG suppository Commonly known as:  DULCOLAX Place 1 suppository (10 mg total) rectally daily as needed for moderate constipation.     carboxymethylcellulose 0.5 % Soln Commonly known as:  REFRESH PLUS Place 1 drop into both eyes every 8 (eight) hours as needed (dry eyes).   carvedilol 3.125 MG tablet Commonly known as:  COREG Place 1 tablet (3.125 mg total) into feeding tube 2 (two) times daily with a meal.   cetirizine 10 MG tablet Commonly known as:  ZYRTEC Place 10 mg into feeding tube daily.   cholestyramine 4 g packet Commonly known as:  QUESTRAN Take 4 g by mouth 2 (two) times daily.   clopidogrel 75 MG tablet Commonly known as:  PLAVIX Place 1 tablet (75 mg total) into feeding tube daily.   clotrimazole 1 % vaginal cream Commonly known as:  GYNE-LOTRIMIN Place 1 Applicatorful vaginally at bedtime.   feeding supplement (OSMOLITE 1.2 CAL) Liqd Place 1,000 mLs into feeding tube continuous. What changed:    how much to take  when to take this   feeding supplement (PRO-STAT SUGAR FREE 64) Liqd Place 30 mLs into feeding tube daily.   free water Soln Place 200 mLs into feeding tube every 6 (six) hours. What changed:  when to take this   nitroGLYCERIN 0.4 MG SL tablet Commonly known as:  NITROSTAT Place 1 tablet (0.4 mg total) under the tongue every 5 (five) minutes as needed for chest pain.   nystatin 100000 UNIT/ML suspension Commonly known as:  MYCOSTATIN Take 5 mLs (500,000 Units total) by mouth 4 (four) times daily.   ranitidine 150 MG/10ML syrup Commonly known as:  ZANTAC Place 10 mLs (150 mg total) into feeding tube daily. Start taking on:  06/05/2017   vancomycin 50 mg/mL oral solution Commonly known as:  VANCOCIN Place 2.5 mLs (125 mg total) into feeding tube every 6 (six) hours for 10 days.      No Known Allergies  Contact information for follow-up providers    Minus Breeding, MD Follow up in 2 day(s).   Specialty:  Cardiology Why:  please call office for an appointment. Contact information: [place] [phone]             Contact information for after-discharge care    [facility] .   Service:  Skilled Nursing Contact information: [address] [place] [phone]                   The results of significant diagnostics from this hospitalization (including imaging, microbiology, ancillary and laboratory) are listed below for reference.    Significant Diagnostic Studies: US Abdomen Complete  Result Date: 05/28/2017 CLINICAL DATA:  Initial evaluation for acute vomiting for 4 days. Gastric tube in place. EXAM: ABDOMEN ULTRASOUND COMPLETE COMPARISON:  Prior CT from 03/29/2017. FINDINGS: Gallbladder: Gallbladder sludge without cholelithiasis. Gallbladder wall measure within normal limits of 1.5 mm. No free pericholecystic fluid. No sonographic  Murphy sign elicited on exam. Common bile duct: Diameter: 6.8 mm Liver: No focal lesion identified. Within normal limits in parenchymal echogenicity. Portal vein is patent on color Doppler imaging with normal direction of blood flow towards the liver. IVC: No abnormality visualized. Pancreas: Not visualized. Spleen: Size and appearance within normal limits. Right Kidney: Length: 9.1 cm. Increased echogenicity within the renal parenchyma, suggesting medical renal disease. No mass or hydronephrosis visualized. 4.3 x 4.3 x 4.8 cm and 1.6 x 1.4 x 1.4 cm cyst noted. Left Kidney: Not visualized. Abdominal aorta: No aneurysm visualized. Other findings: None. IMPRESSION: 1. Gallbladder sludge without cholelithiasis or evidence for acute cholecystitis. No biliary dilatation. 2. Increased echogenicity within the right renal parenchyma, suggesting medical renal disease. No hydronephrosis. Left kidney not visualized due to technical limitations. 3. Right renal cysts as above. Electronically Signed   By: Jeannine Boga M.D.   On: 05/28/2017 03:33   Ct Abdomen Pelvis W Contrast  Result Date: 05/31/2017 CLINICAL DATA:   Abdominal pain, GI bleeding EXAM: CT ABDOMEN AND PELVIS WITH CONTRAST TECHNIQUE: Multidetector CT imaging of the abdomen and pelvis was performed using the standard protocol following bolus administration of intravenous contrast. CONTRAST:  115mL OMNIPAQUE IOHEXOL 300 MG/ML  SOLN COMPARISON:  CT abdomen dated 03/29/2017 FINDINGS: Motion degraded images. Lower chest: Small bilateral pleural effusions. Mild dependent atelectasis in the bilateral lower lobes. Hepatobiliary: Liver is within normal limits. Gallbladder is unremarkable. No intrahepatic or extrahepatic ductal dilatation. Pancreas: Within normal limits. Spleen: Within normal limits. Adrenals/Urinary Tract: Adrenal glands are within normal limits. Bilateral renal cysts, measuring up to 5.0 cm in the posterior right lower kidney (series 3/image 35). No hydronephrosis. Bladder is within normal limits. Stomach/Bowel: Stomach is notable for a percutaneous gastrostomy in satisfactory position. No evidence of bowel obstruction. Appendix is not discretely visualized. No definite colonic wall thickening is evident on CT, noting motion degradation and streak artifact. Vascular/Lymphatic: No evidence of abdominal aortic aneurysm. Atherosclerotic calcifications of the abdominal aorta and branch vessels. No suspicious abdominopelvic lymphadenopathy. Reproductive: Markedly enlarged uterus with numerous fibroids, many of which are calcified. No adnexal masses. Other: No abdominopelvic ascites. Musculoskeletal: Mild degenerative changes of the visualized thoracolumbar spine. Streak artifact overlying the pelvis from the patient's left hip prosthesis. IMPRESSION: Limited evaluation secondary to motion degradation and streak artifact. No definite colonic wall thickening on CT. No evidence of bowel obstruction. Small bilateral pleural effusions. Large fibroid uterus. Additional ancillary findings as above. Electronically Signed   By: Julian Hy M.D.   On: 05/31/2017  20:20   Dg Abd Acute W/chest  Result Date: 05/28/2017 CLINICAL DATA:  Vomiting and diarrhea. EXAM: DG ABDOMEN ACUTE W/ 1V CHEST COMPARISON:  Chest x-ray dated April 19, 2017. Abdominal x-ray dated April 20, 2017. FINDINGS: The patient is rotated to the left on the chest x-ray. Stable mild cardiomegaly. Normal pulmonary vascularity. No focal consolidation, pleural effusion, or pneumothorax. No acute osseous abnormality. Gastrostomy tube remains in place. No dilated small bowel loops or air-fluid levels. Large colonic stool burden with large stool ball in the rectum. No pneumoperitoneum. Multiple large calcified fibroids are again noted within the pelvis. IMPRESSION: 1. Large colonic stool burden with large stool ball in the rectum, concerning for constipation with fecal impaction. 2. No evidence of bowel obstruction. 3.  No active cardiopulmonary disease. Electronically Signed   By: Titus Dubin M.D.   On: 05/28/2017 07:27    Microbiology: Recent Results (from the past 240 hour(s))  Urine Culture  Status: Abnormal   Collection Time: 05/28/17  4:50 AM  Result Value Ref Range Status   Specimen Description URINE, RANDOM  Final   Special Requests   Final    NONE Performed at [facility], [address], [place], [place] [address]    Culture >=100,000 COLONIES/mL YEAST (A)  Final   Report Status 05/29/2017 FINAL  Final  MRSA PCR Screening     Status: None   Collection Time: 05/28/17  5:09 PM  Result Value Ref Range Status   MRSA by PCR NEGATIVE NEGATIVE Final    Comment:        The GeneXpert MRSA Assay (FDA approved for NASAL specimens only), is one component of a comprehensive MRSA colonization surveillance program. It is not intended to diagnose MRSA infection nor to guide or monitor treatment for MRSA infections. Performed at [facility], [place] [address], [place], [place]  [address]   C difficile quick scan w PCR reflex     Status: Abnormal   Collection Time:  05/30/17  3:31 PM  Result Value Ref Range Status   C Diff antigen POSITIVE (A) NEGATIVE Final   C Diff toxin POSITIVE (A) NEGATIVE Final   C Diff interpretation Toxin producing C. difficile detected.  Final    Comment: CRITICAL RESULT CALLED TO, READ BACK BY AND VERIFIED WITH: Geronimo Boot RN 16:45 05/30/17 ([email or web address]) Performed at [facility], [place] [address] [place], [place] [address]      Labs: Basic Metabolic Panel: Recent Labs  Lab 05/29/17 0745 05/30/17 0756 05/31/17 0341 06/02/17 0454  NA 142 141 143 143  K 3.3* 3.7 3.6 3.6  CL 112* 112* 113* 115*  CO2 23 21* 21* 21*  GLUCOSE 81 96 112* 104*  BUN 29* 25* 23* 24*  CREATININE 0.79 0.71 0.75 0.81  CALCIUM 8.0* 8.0* 8.2* 7.8*   Liver Function Tests: Recent Labs  Lab 05/30/17 0756 05/31/17 0341 06/02/17 0454  AST 48* 56* 56*  ALT 61* 68* 79*  ALKPHOS 69 64 64  BILITOT 0.3 0.4 0.3  PROT 5.1* 4.9* 5.2*  ALBUMIN 1.7* 1.8* 1.7*   No results for input(s): LIPASE, AMYLASE in the last 168 hours. No results for input(s): AMMONIA in the last 168 hours. CBC: Recent Labs  Lab 05/29/17 0745 05/30/17 0756 05/31/17 0341 06/01/17 1009 06/02/17 0454  WBC 10.1 7.5 8.8 8.7 8.3  HGB 8.5* 8.0* 7.7* 7.9* 7.9*  HCT 28.1* 25.4* 24.4* 24.6* 25.0*  MCV 90.1 89.8 88.1 86.6 87.7  PLT 326 346 364 374 364   Cardiac Enzymes: No results for input(s): CKTOTAL, CKMB, CKMBINDEX, TROPONINI in the last 168 hours. BNP: BNP (last 3 results) No results for input(s): BNP in the last 8760 hours.  ProBNP (last 3 results) No results for input(s): PROBNP in the last 8760 hours.  CBG: Recent Labs  Lab 06/03/17 2326 06/04/17 0341 06/04/17 0637 06/04/17 0740 06/04/17 1221  GLUCAP 182* 99 88 88 133*       Signed:  Kayleen Memos, MD [facility] 06/04/2017, 2:19 PM

## 2017-06-05 ENCOUNTER — Inpatient Hospital Stay (HOSPITAL_COMMUNITY): Payer: Medicare Other

## 2017-06-05 LAB — BASIC METABOLIC PANEL
Anion gap: 7 (ref 5–15)
BUN: 14 mg/dL (ref 6–20)
CO2: 22 mmol/L (ref 22–32)
CREATININE: 0.67 mg/dL (ref 0.44–1.00)
Calcium: 8.2 mg/dL — ABNORMAL LOW (ref 8.9–10.3)
Chloride: 115 mmol/L — ABNORMAL HIGH (ref 101–111)
Glucose, Bld: 75 mg/dL (ref 65–99)
POTASSIUM: 3.2 mmol/L — AB (ref 3.5–5.1)
SODIUM: 144 mmol/L (ref 135–145)

## 2017-06-05 LAB — GLUCOSE, CAPILLARY
Glucose-Capillary: 175 mg/dL — ABNORMAL HIGH (ref 65–99)
Glucose-Capillary: 92 mg/dL (ref 65–99)
Glucose-Capillary: 81 mg/dL (ref 65–99)

## 2017-06-05 LAB — CBC
HEMATOCRIT: 25.7 % — AB (ref 36.0–46.0)
HEMOGLOBIN: 8 g/dL — AB (ref 12.0–15.0)
MCH: 27.3 pg (ref 26.0–34.0)
MCHC: 31.1 g/dL (ref 30.0–36.0)
MCV: 87.7 fL (ref 78.0–100.0)
PLATELETS: 418 10*3/uL — AB (ref 150–400)
RBC: 2.93 MIL/uL — AB (ref 3.87–5.11)
RDW: 16.3 % — ABNORMAL HIGH (ref 11.5–15.5)
WBC: 6.5 10*3/uL (ref 4.0–10.5)

## 2017-06-05 MED ORDER — AMLODIPINE BESYLATE 5 MG PO TABS
5.0000 mg | ORAL_TABLET | Freq: Every day | ORAL | Status: DC
  Administered 2017-06-05: 5 mg via ORAL
  Filled 2017-06-05: qty 1

## 2017-06-05 MED ORDER — AMLODIPINE BESYLATE 5 MG PO TABS: 5.0000 mg | ORAL_TABLET | Freq: Every day | ORAL | 0 refills | Status: DC

## 2017-06-05 MED ORDER — POTASSIUM CHLORIDE CRYS ER 20 MEQ PO TBCR
40.0000 meq | EXTENDED_RELEASE_TABLET | Freq: Once | ORAL | Status: AC
Start: 2017-06-05 — End: 2017-06-05
  Administered 2017-06-05: 40 meq via ORAL
  Filled 2017-06-05: qty 2

## 2017-06-05 MED ORDER — GUAIFENESIN-DM 100-10 MG/5ML PO SYRP
5.0000 mL | ORAL_SOLUTION | Freq: Three times a day (TID) | ORAL | Status: DC
  Administered 2017-06-05: 5 mL
  Filled 2017-06-05: qty 5

## 2017-06-05 NOTE — Progress Notes (Signed)
Report called and given to facility nurse Rawlings. All questions addressed

## 2017-06-05 NOTE — Plan of Care (Signed)
  Problem: Education: Goal: Knowledge of General Education information will improve Outcome: Progressing Note:  POC reviewed with pt.; pt. seem to understand some; pt. will nod head or gesture with (L) hand.

## 2017-06-05 NOTE — Discharge Summary (Addendum)
Discharge Summary  Alyssa Savage CHE:[id number] DOB: [birth year]  PCP: Patient, No Pcp Per  Admit date: 05/27/2017 Discharge date: 06/05/2017  Time spent: 25 mins   Update: Discharge delayed yesterday 06/04/2017 by awaiting bed placement at SNF.  Recommendations for Outpatient Follow-up:  1. Follow-up with PCP 2. Take medications as prescribed 3. Fall precautions 4. Repeat CBC on 06/06/2017 to check hemoglobin level.  Discharge Diagnoses:  Active Hospital Problems   Diagnosis Date Noted  . UTI (urinary tract infection) 04/19/2017  . Clostridium difficile diarrhea   . Uterine leiomyoma   . Rectal bleeding   . Nausea and vomiting 05/28/2017  . CKD (chronic kidney disease), stage III ([facility]) 04/20/2017  . Dysphagia 04/19/2017  . Protein-calorie malnutrition, severe 03/24/2017  . Stroke ([facility]) 03/21/2017  . GERD (gastroesophageal reflux disease)   . Prolonged Q-T interval on ECG     Resolved Hospital Problems  No resolved problems to display.    Discharge Condition: Stable  Diet recommendation: Continue PEG tube feeding  Vitals:   06/05/17 0112 06/05/17 0539  BP: (!) 172/96 (!) 175/94  Pulse: 92 92  Resp: 20 20  Temp: 98.2 F (36.8 C) 98.2 F (36.8 C)  SpO2: 100% 95%    History of present illness:  84 y.o.femalewith medical history significant forrecent stroke, with residual right-sided deficits, severe dysphagia with PEG in place, history of left hip fracture in February 2019, hypertension among other issues listed below, presenting today from skilled nursing facility with nausea, vomiting, abdominal distention with intermittent watery diarrhea and constipation. The patient is unable to speak due to recent stroke, and at the facility was less interactive (however here at the emergency department is more alert and able to interact).In review, the patient had been diagnosed with a UTI during last admission, cultures positive for fungus, and had been placed on Diflucan  per tube. Today, her UA continues to be positive for fungus in urine, with cultures pending. Thus, she is being admitted for the treatment of UTI, as well as management of her GI symptoms. No documented fevers. No apparent shortness of breath or chest pain. She appears very weak and fatigued. No apparent bleeding issues.  05/30/17: Patient seen and examined at her bedside.  She is aphasic.  Unable to obtain review of systems from her.  Called her daughter who Is also medical POA [phone] to update on current medical condition.  Per RN patient had blood in her stool -ordered FOBT.  Hematuria also noted.  GI consulted.  If hematuria is persistent we will consult urology.  Urine analysis positive for RBCs.  No sign of kidney stones on abd u/s.CT may be considered to rule out stones.  05/31/2017: Patient seen and examined at her bedside.  She is alert in the setting of aphasia.  C. difficile positive.  Started on oral vancomycin on 05/30/2017.  GI following.  Pain on palpation of right lower quadrant.  06/01/2017: Patient seen and examined with her daughter at bedside.  Daughter expresses that the patient is no back to her baseline.  We will continue oral vancomycin and gentle IV fluid hydration.  06/02/17: Seen and examined with her daughter at bedside. No new complaints. She is more alert today and communicates with gestures.  Had 3 bowel movements since 5:00 this morning.  We will continue gentle IV fluid along with her PEG tube feeding with possible discharge tomorrow to SNF.  06/03/17: No new complaints. Awaiting bed placement to SNF.  06/04/17: Seen and examined at bedside.  No new complaints.  06/05/2017: Patient seen and examined with her daughter at bedside.  She has no new complaints and is in no acute distress.  Labs ordered and personally reviewed.  Revealed no leukocytosis, stable hemoglobin, and no acute injuries.   Chest x-ray ordered and personally reviewed.  Revealed no acute changes  with suspicion for atelectasis at right lower lobe.  On the day of discharge patient was hemodynamically stable. She will need to follow-up with PCP post hospitalization.    Hospital Course:  Principal Problem:   UTI (urinary tract infection) Active Problems:   GERD (gastroesophageal reflux disease)   Stroke ([facility])   Prolonged Q-T interval on ECG   Protein-calorie malnutrition, severe   Dysphagia   CKD (chronic kidney disease), stage III ([facility])   Nausea and vomiting   Clostridium difficile diarrhea   Uterine leiomyoma   Rectal bleeding  C. difficile colitis, stable C. difficile colitis is improving Positive C. difficile 01/29/2018 Continue oral vancomycin 125 mg 4 times daily Day#4/14 days (05/30/2017) CT abdomen pelvis with contrast revealed no definite colonic wall thickening and no evidence of bowel obstruction.  Showed a large fibroid uterus.  Suspected GI bleed, resolved Hemoglobin is stable FOBT positive on 05/30/2017 On Zantac GI consulted recommended treating C. Difficile Hemoglobin stable at 8.0 on 06/05/2017 from 7.9  Dysphagia PEG tube feeding stable Continue PEG tube feeding Continue to monitor flushes  Dehydration Continue gentle IV hydration 75 cc/h of normal saline Continue free water flushes with PEG tube  Large fibroid uterus Follow up outpatient  Intractable nausea, resolved  Chronic normocytic anemia Obtain iron studies outpatient Hemoglobin stable at 7.9 Baseline hemoglobin 8.6  Worsening transaminitis No sign of cholecystitis on abdominal ultrasound LFTs continue to trend up CT abdomen pelvis with contrast ordered Follow-up with PCP outpatient Denies right upper quadrant pain or tenderness on palpation  Hematuria Hematuria has resolved Abdominal ultrasound done on 05/28/2017 revealed right renal cyst measuring about 4.8 cm Continue gentle IV hydration Continue to monitor urine output  Severe protein calorie malnutrition Albumin  1.7 Continue peg tube feeding  Hypokalemia Potassium 3.2 Repleted with 40 mEq of p.o. KCl Follow-up with PCP outpatient  Hypertension Added Norvasc 5 mg daily Follow-up with PCP outpatient     Procedures: None Consultations:  GI  Discharge Exam: BP (!) 175/94 (BP Location: Right Arm)   Pulse 92   Temp 98.2 F (36.8 C) (Oral)   Resp 20   Ht 5' (1.524 m)   Wt 59.6 kg (131 lb 6.3 oz)   SpO2 95%   BMI 25.66 kg/m    . General: 84 y.o. year-old female well-developed well-nourished in no acute distress.  Alert in the setting of aphasia.   . Cardiovascular: Regular rate and rhythm with no rubs or gallops.  No thyromegaly or JVD noted.   Marland Kitchen Respiratory: Mild rales at bases.  With no wheezes.  Good inspiratory effort.   . Abdomen: Soft nontender nondistended with normal bowel sounds x4 quadrants. . Musculoskeletal: No lower extremity edema. 2/4 pulses in all 4 extremities. Marland Kitchen Psychiatry: Mood is appropriate for condition and setting  Discharge Instructions You were cared for by a hospitalist during your hospital stay. If you have any questions about your discharge medications or the care you received while you were in the hospital after you are discharged, you can call the unit and asked to speak with the hospitalist on call if the hospitalist that took care of you is not available. Once you are  discharged, your primary care physician will handle any further medical issues. Please note that NO REFILLS for any discharge medications will be authorized once you are discharged, as it is imperative that you return to your primary care physician (or establish a relationship with a primary care physician if you do not have one) for your aftercare needs so that they can reassess your need for medications and monitor your lab values.   Allergies as of 06/05/2017   No Known Allergies     Medication List    STOP taking these medications   enoxaparin 30 MG/0.3ML injection Commonly known  as:  LOVENOX   HYDROcodone-acetaminophen 5-325 MG tablet Commonly known as:  NORCO/VICODIN   loperamide 1 MG/5ML solution Commonly known as:  IMODIUM   MUCINEX FAST-MAX CONGEST COUGH 2.5-5-100 MG/5ML Liqd Generic drug:  Phenylephrine-DM-GG     TAKE these medications   amLODipine 5 MG tablet Commonly known as:  NORVASC Take 1 tablet (5 mg total) by mouth daily. Start taking on:  06/06/2017   atorvastatin 40 MG tablet Commonly known as:  LIPITOR Place 1 tablet (40 mg total) into feeding tube daily at 6 PM.   bisacodyl 10 MG suppository Commonly known as:  DULCOLAX Place 1 suppository (10 mg total) rectally daily as needed for moderate constipation.   carboxymethylcellulose 0.5 % Soln Commonly known as:  REFRESH PLUS Place 1 drop into both eyes every 8 (eight) hours as needed (dry eyes).   carvedilol 3.125 MG tablet Commonly known as:  COREG Place 1 tablet (3.125 mg total) into feeding tube 2 (two) times daily with a meal.   cetirizine 10 MG tablet Commonly known as:  ZYRTEC Place 10 mg into feeding tube daily.   cholestyramine 4 g packet Commonly known as:  QUESTRAN Take 4 g by mouth 2 (two) times daily.   clopidogrel 75 MG tablet Commonly known as:  PLAVIX Place 1 tablet (75 mg total) into feeding tube daily.   clotrimazole 1 % vaginal cream Commonly known as:  GYNE-LOTRIMIN Place 1 Applicatorful vaginally at bedtime.   feeding supplement (OSMOLITE 1.2 CAL) Liqd Place 1,000 mLs into feeding tube continuous. What changed:    how much to take  when to take this   feeding supplement (PRO-STAT SUGAR FREE 64) Liqd Place 30 mLs into feeding tube daily.   free water Soln Place 200 mLs into feeding tube every 6 (six) hours. What changed:  when to take this   nitroGLYCERIN 0.4 MG SL tablet Commonly known as:  NITROSTAT Place 1 tablet (0.4 mg total) under the tongue every 5 (five) minutes as needed for chest pain.   nystatin 100000 UNIT/ML suspension Commonly  known as:  MYCOSTATIN Take 5 mLs (500,000 Units total) by mouth 4 (four) times daily.   ranitidine 150 MG/10ML syrup Commonly known as:  ZANTAC Place 10 mLs (150 mg total) into feeding tube daily.   vancomycin 50 mg/mL oral solution Commonly known as:  VANCOCIN Place 2.5 mLs (125 mg total) into feeding tube every 6 (six) hours for 10 days.      No Known Allergies  Contact information for follow-up providers    Minus Breeding, MD Follow up in 2 day(s).   Specialty:  Cardiology Why:  please call office for an appointment. Contact information: [place] [address] [phone]            Contact information for after-discharge care    [facility] .  Service:  Skilled Nursing Contact information: [address] [place] [phone]                   The results of significant diagnostics from this hospitalization (including imaging, microbiology, ancillary and laboratory) are listed below for reference.    Significant Diagnostic Studies: US Abdomen Complete  Result Date: 05/28/2017 CLINICAL DATA:  Initial evaluation for acute vomiting for 4 days. Gastric tube in place. EXAM: ABDOMEN ULTRASOUND COMPLETE COMPARISON:  Prior CT from 03/29/2017. FINDINGS: Gallbladder: Gallbladder sludge without cholelithiasis. Gallbladder wall measure within normal limits of 1.5 mm. No free pericholecystic fluid. No sonographic Murphy sign elicited on exam. Common bile duct: Diameter: 6.8 mm Liver: No focal lesion identified. Within normal limits in parenchymal echogenicity. Portal vein is patent on color Doppler imaging with normal direction of blood flow towards the liver. IVC: No abnormality visualized. Pancreas: Not visualized. Spleen: Size and appearance within normal limits. Right Kidney: Length: 9.1 cm. Increased echogenicity within the renal parenchyma, suggesting medical renal disease. No mass or  hydronephrosis visualized. 4.3 x 4.3 x 4.8 cm and 1.6 x 1.4 x 1.4 cm cyst noted. Left Kidney: Not visualized. Abdominal aorta: No aneurysm visualized. Other findings: None. IMPRESSION: 1. Gallbladder sludge without cholelithiasis or evidence for acute cholecystitis. No biliary dilatation. 2. Increased echogenicity within the right renal parenchyma, suggesting medical renal disease. No hydronephrosis. Left kidney not visualized due to technical limitations. 3. Right renal cysts as above. Electronically Signed   By: Jeannine Boga M.D.   On: 05/28/2017 03:33   Ct Abdomen Pelvis W Contrast  Result Date: 05/31/2017 CLINICAL DATA:  Abdominal pain, GI bleeding EXAM: CT ABDOMEN AND PELVIS WITH CONTRAST TECHNIQUE: Multidetector CT imaging of the abdomen and pelvis was performed using the standard protocol following bolus administration of intravenous contrast. CONTRAST:  135mL OMNIPAQUE IOHEXOL 300 MG/ML  SOLN COMPARISON:  CT abdomen dated 03/29/2017 FINDINGS: Motion degraded images. Lower chest: Small bilateral pleural effusions. Mild dependent atelectasis in the bilateral lower lobes. Hepatobiliary: Liver is within normal limits. Gallbladder is unremarkable. No intrahepatic or extrahepatic ductal dilatation. Pancreas: Within normal limits. Spleen: Within normal limits. Adrenals/Urinary Tract: Adrenal glands are within normal limits. Bilateral renal cysts, measuring up to 5.0 cm in the posterior right lower kidney (series 3/image 35). No hydronephrosis. Bladder is within normal limits. Stomach/Bowel: Stomach is notable for a percutaneous gastrostomy in satisfactory position. No evidence of bowel obstruction. Appendix is not discretely visualized. No definite colonic wall thickening is evident on CT, noting motion degradation and streak artifact. Vascular/Lymphatic: No evidence of abdominal aortic aneurysm. Atherosclerotic calcifications of the abdominal aorta and branch vessels. No suspicious abdominopelvic  lymphadenopathy. Reproductive: Markedly enlarged uterus with numerous fibroids, many of which are calcified. No adnexal masses. Other: No abdominopelvic ascites. Musculoskeletal: Mild degenerative changes of the visualized thoracolumbar spine. Streak artifact overlying the pelvis from the patient's left hip prosthesis. IMPRESSION: Limited evaluation secondary to motion degradation and streak artifact. No definite colonic wall thickening on CT. No evidence of bowel obstruction. Small bilateral pleural effusions. Large fibroid uterus. Additional ancillary findings as above. Electronically Signed   By: Julian Hy M.D.   On: 05/31/2017 20:20   Dg Abd Acute W/chest  Result Date: 05/28/2017 CLINICAL DATA:  Vomiting and diarrhea. EXAM: DG ABDOMEN ACUTE W/ 1V CHEST COMPARISON:  Chest x-ray dated April 19, 2017. Abdominal x-ray dated April 20, 2017. FINDINGS: The patient is rotated to the left on the chest x-ray. Stable mild cardiomegaly. Normal pulmonary vascularity.  No focal consolidation, pleural effusion, or pneumothorax. No acute osseous abnormality. Gastrostomy tube remains in place. No dilated small bowel loops or air-fluid levels. Large colonic stool burden with large stool ball in the rectum. No pneumoperitoneum. Multiple large calcified fibroids are again noted within the pelvis. IMPRESSION: 1. Large colonic stool burden with large stool ball in the rectum, concerning for constipation with fecal impaction. 2. No evidence of bowel obstruction. 3.  No active cardiopulmonary disease. Electronically Signed   By: Titus Dubin M.D.   On: 05/28/2017 07:27    Microbiology: Recent Results (from the past 240 hour(s))  Urine Culture     Status: Abnormal   Collection Time: 05/28/17  4:50 AM  Result Value Ref Range Status   Specimen Description URINE, RANDOM  Final   Special Requests   Final    NONE Performed at [facility], [address], [place], [place] [address]    Culture >=100,000  COLONIES/mL YEAST (A)  Final   Report Status 05/29/2017 FINAL  Final  MRSA PCR Screening     Status: None   Collection Time: 05/28/17  5:09 PM  Result Value Ref Range Status   MRSA by PCR NEGATIVE NEGATIVE Final    Comment:        The GeneXpert MRSA Assay (FDA approved for NASAL specimens only), is one component of a comprehensive MRSA colonization surveillance program. It is not intended to diagnose MRSA infection nor to guide or monitor treatment for MRSA infections. Performed at [facility], [place] [address] [place], [place] [address]   C difficile quick scan w PCR reflex     Status: Abnormal   Collection Time: 05/30/17  3:31 PM  Result Value Ref Range Status   C Diff antigen POSITIVE (A) NEGATIVE Final   C Diff toxin POSITIVE (A) NEGATIVE Final   C Diff interpretation Toxin producing C. difficile detected.  Final    Comment: CRITICAL RESULT CALLED TO, READ BACK BY AND VERIFIED WITH: Geronimo Boot RN 16:45 05/30/17 ([email or web address]) Performed at [facility], [place] [address], [place], [place] [address]      Labs: Basic Metabolic Panel: Recent Labs  Lab 05/30/17 0756 05/31/17 0341 06/02/17 0454 06/05/17 1012  NA 141 143 143 144  K 3.7 3.6 3.6 3.2*  CL 112* 113* 115* 115*  CO2 21* 21* 21* 22  GLUCOSE 96 112* 104* 75  BUN 25* 23* 24* 14  CREATININE 0.71 0.75 0.81 0.67  CALCIUM 8.0* 8.2* 7.8* 8.2*   Liver Function Tests: Recent Labs  Lab 05/30/17 0756 05/31/17 0341 06/02/17 0454  AST 48* 56* 56*  ALT 61* 68* 79*  ALKPHOS 69 64 64  BILITOT 0.3 0.4 0.3  PROT 5.1* 4.9* 5.2*  ALBUMIN 1.7* 1.8* 1.7*   No results for input(s): LIPASE, AMYLASE in the last 168 hours. No results for input(s): AMMONIA in the last 168 hours. CBC: Recent Labs  Lab 05/30/17 0756 05/31/17 0341 06/01/17 1009 06/02/17 0454 06/05/17 1012  WBC 7.5 8.8 8.7 8.3 6.5  HGB 8.0* 7.7* 7.9* 7.9* 8.0*  HCT 25.4* 24.4* 24.6* 25.0* 25.7*  MCV 89.8 88.1 86.6 87.7 87.7  PLT 346 364 374 364  418*   Cardiac Enzymes: No results for input(s): CKTOTAL, CKMB, CKMBINDEX, TROPONINI in the last 168 hours. BNP: BNP (last 3 results) No results for input(s): BNP in the last 8760 hours.  ProBNP (last 3 results) No results for input(s): PROBNP in the last 8760 hours.  CBG: Recent  Labs  Lab 06/04/17 1726 06/04/17 2044 06/05/17 0109 06/05/17 0532 06/05/17 0815  GLUCAP 126* 136* 175* 92 81       Signed:  Kayleen Memos, MD [facility] 06/05/2017, 12:31 PM

## 2017-06-05 NOTE — Progress Notes (Signed)
Pt discharged to [facility] via ptar in stable condition. Went over discharge instructions with daughter with no concerns expressed . AVS and discharge scripts given

## 2017-06-05 NOTE — Clinical Social Work Note (Signed)
Patient to be d/c'ed today to [facility].  Patient and family agreeable to plans will transport via ems RN to call report to [phone] room 112.  Patient's daughter Erline Levine is at bedside and aware that patient will be discharging today.  Evette Cristal, MSW, [place]

## 2017-06-06 NOTE — Progress Notes (Signed)
Cardiology Office Note   Date:  06/07/2017   ID:  Alyssa Savage, DOB [birth year], MRN [id number]  PCP:  Patient, No Pcp Per  Cardiologist:  Dr. Percival Spanish  Chief Complaint  Patient presents with  . Follow-up     History of Present Illness: Alyssa Savage is a 84 y.o. female who presents for ongoing assessment and management after hospitalization due to fall, and left femoral neck fracture, found to have acute CVA and hypokalemia.  Patient also was diagnosed with chronic kidney disease stage III.  She was initially seen as a preoperative clearance prior to hip repair.  He was also recommended at that time that she be followed by neurology.  Echocardiogram was performed during hospitalization which revealed moderate concentric hypertrophy, normal systolic function with EF of 60% to 65% with no regional wall motion abnormalities.  There is no valvular stenosis of the aortic valve or mitral valve.  The RV size was normal and the tricuspid valve did not reveal any regurgitation.  She was cleared for surgery to have hip repaired.  She continues to have residual right-sided deficits with severe dysphasia, a PEG tube was placed.  The patient unfortunately had UTI during admission which extended her stay and also had cultures positive for fungus.  She was readmitted to the hospital for UTI treatment after going to rehabilitation facility.  She was found to be C. difficile positive and started on oral vancomycin with GI consultation.  The patient was discharged to skilled nursing facility on 06/04/2017.  She is here today with her daughter who is very attentive.  She remains on a stretcher and she was brought over by [facility] to this appointment.  The patient is severely dysphasic, is unable to communicate without pointing and grunting.  She is cooperative but easily agitated.  She is hypertensive today.  Past Medical History:  Diagnosis Date  . Femoral neck fracture ([place]) 03/2017   left   . GERD  (gastroesophageal reflux disease)   . Hypertensive emergency 03/2017  . Hypokalemia 03/2017  . Stroke [facility]     Past Surgical History:  Procedure Laterality Date  . ANTERIOR APPROACH HEMI HIP ARTHROPLASTY Left 03/25/2017   Procedure: ANTERIOR APPROACH HEMI HIP ARTHROPLASTY;  Surgeon: Rod Can, MD;  Location: [place];  Service: Orthopedics;  Laterality: Left;  . IR GASTROSTOMY TUBE MOD SED  03/31/2017     Current Outpatient Medications  Medication Sig Dispense Refill  . Amino Acids-Protein Hydrolys (FEEDING SUPPLEMENT, PRO-STAT SUGAR FREE 64,) LIQD Place 30 mLs into feeding tube daily.    Marland Kitchen amLODipine (NORVASC) 5 MG tablet Take 1 tablet (5 mg total) by mouth daily. 30 tablet 0  . atorvastatin (LIPITOR) 40 MG tablet Place 1 tablet (40 mg total) into feeding tube daily at 6 PM. 30 tablet 0  . bisacodyl (DULCOLAX) 10 MG suppository Place 1 suppository (10 mg total) rectally daily as needed for moderate constipation. 12 suppository 0  . carboxymethylcellulose (REFRESH PLUS) 0.5 % SOLN Place 1 drop into both eyes every 8 (eight) hours as needed (dry eyes).    . carvedilol (COREG) 3.125 MG tablet Place 1 tablet (3.125 mg total) into feeding tube 2 (two) times daily with a meal. 30 tablet 0  . cetirizine (ZYRTEC) 10 MG tablet Place 10 mg into feeding tube daily.    . cholestyramine (QUESTRAN) 4 g packet Take 4 g by mouth 2 (two) times daily.    . clopidogrel (PLAVIX) 75 MG tablet Place 1 tablet (75 mg total) into  feeding tube daily. 30 tablet 0  . clotrimazole (GYNE-LOTRIMIN) 1 % vaginal cream Place 1 Applicatorful vaginally at bedtime. 45 g 0  . fluconazole (DIFLUCAN) 100 MG tablet Take 100 mg by mouth daily.    . nitroGLYCERIN (NITROSTAT) 0.4 MG SL tablet Place 1 tablet (0.4 mg total) under the tongue every 5 (five) minutes as needed for chest pain. 30 tablet 0  . Nutritional Supplements (FEEDING SUPPLEMENT, OSMOLITE 1.2 CAL,) LIQD Place 1,000 mLs into feeding tube continuous. (Patient  taking differently: Place 237 mLs into feeding tube 4 (four) times daily. ) 1000 mL 5  . nystatin (MYCOSTATIN) 100000 UNIT/ML suspension Take 5 mLs (500,000 Units total) by mouth 4 (four) times daily. 60 mL 0  . ranitidine (ZANTAC) 150 MG/10ML syrup Place 10 mLs (150 mg total) into feeding tube daily. 300 mL 0  . vancomycin (VANCOCIN) 50 mg/mL oral solution Place 2.5 mLs (125 mg total) into feeding tube every 6 (six) hours for 10 days. 100 mL 0  . Water For Irrigation, Sterile (FREE WATER) SOLN Place 200 mLs into feeding tube every 6 (six) hours. (Patient taking differently: Place 200 mLs into feeding tube 4 (four) times daily. ) 1000 mL 5   No current facility-administered medications for this visit.     Allergies:   Patient has no known allergies.    Social History:  The patient  reports that she has never smoked. She has never used smokeless tobacco. She reports that she does not drink alcohol or use drugs.   Family History:  The patient's family history includes CAD in her mother; Heart disease in her mother.    ROS: All other systems are reviewed and negative. Unless otherwise mentioned in H&P    PHYSICAL EXAM: VS:  BP (!) 183/99   Pulse 100   Ht 5' (1.524 m)   BMI 25.66 kg/m  , BMI Body mass index is 25.66 kg/m. GEN: Well nourished, well developed, in no acute distress  HEENT: normal  Neck: no JVD, carotid bruits, or masses Cardiac: RRR; tachycardic, no murmurs, rubs, or gallops,no edema  Respiratory: Upper airway congestion, with poor cough effort.  No wheezes or rhonchi in the lower lobes poor inspiratory effort. GI: soft, nontender, nondistended, + BS PEG tube in place. MS: no deformity or atrophy wearing bilateral SCDs. Skin: warm and dry, no rash Neuro: Right-sided hemiparesis, ataxia of the left side.  Right facial droop is noted. Psych: euthymic mood, full affect easily agitated.   EKG: Not completed on this office visit.  Recent Labs: 03/29/2017: Magnesium  1.7 06/02/2017: ALT 79 06/05/2017: BUN 14; Creatinine, Ser 0.67; Hemoglobin 8.0; Platelets 418; Potassium 3.2; Sodium 144    Lipid Panel    Component Value Date/Time   CHOL 203 (H) 03/22/2017 0344   TRIG 90 03/22/2017 0344   HDL 68 03/22/2017 0344   CHOLHDL 3.0 03/22/2017 0344   VLDL 18 03/22/2017 0344   LDLCALC 117 (H) 03/22/2017 0344      Wt Readings from Last 3 Encounters:  06/04/17 131 lb 6.3 oz (59.6 kg)  04/25/17 117 lb 8.1 oz (53.3 kg)  04/04/17 119 lb 14.9 oz (54.4 kg)      Other studies Reviewed:  Echocardiogram 03/22/2017  Left ventricle: The cavity size was normal. There was moderate   concentric hypertrophy. Systolic function was normal. The   estimated ejection fraction was in the range of 60% to 65%. Wall   motion was normal; there were no regional wall motion   abnormalities.  Doppler parameters are consistent with abnormal   left ventricular relaxation (grade 1 diastolic dysfunction). - Aortic valve: Transvalvular velocity was within the normal range.   There was no stenosis. There was no regurgitation. - Mitral valve: Transvalvular velocity was within the normal range.   There was no evidence for stenosis. There was no regurgitation. - Right ventricle: The cavity size was normal. Wall thickness was   normal. Systolic function was normal. - Tricuspid valve: There was no regurgitation.  MRI 04/20/2017 IMPRESSION: 1. Small acute infarct of the right paramedian pons. 2. Subacute infarct of the left basal ganglia, unchanged in size. 3. Numerous scattered chronic microhemorrhages throughout the brain, unchanged. This could indicate chronic hypertensive microangiopathy, amyloid angiopathy or a combination of the two.  CXR: 06/05/2017 EXAM: PORTABLE CHEST 1 VIEW  COMPARISON:  05/28/2017  FINDINGS: Enlarged cardiac silhouette. Calcific atherosclerotic disease and tortuosity of the aorta. Mediastinal contours appear intact.  There is no evidence of focal  airspace consolidation, pleural effusion or pneumothorax. Increased pulmonary vascularity.  Osseous structures are without acute abnormality. Soft tissues are grossly normal.  IMPRESSION: Enlarged cardiac silhouette. Interstitial pulmonary edema.  ASSESSMENT AND PLAN:  1.  CVA: MRI reveals bilateral infarcts.  The patient is aphasic, ataxia on the left with hemiparesis on the right.  Poor swallowing effort.  Continues on Plavix.  Medications are given via PEG tube.  2.  CHF versus aspiration: I have asked for the head of her bed remain greater than 45 degrees to prevent aspiration after tube feedings.  She does have a rattling cough in the upper airways with trouble swallowing.  She does cough but is poor effort.  I am going to make sure she has a Yonker suction catheter with her to help her remove phlegm from her mouth and assist with clearing her airway.  I have looked at the chest x-ray report from 06/05/2017 (2 days ago) and it did reveal interstitial pulmonary edema.  I will add Lasix 20 mg to her medication regimen having reviewed chest x-ray results.  A BMET and a CBC are ordered today to evaluate for kidney function and signs of infection.  I have explained this to her daughter who verbalizes understanding and will make sure that a suction catheter is provided for her at bedside.  She is high risk for aspiration pneumonia.  3.  Hypertension: Elevated today likely due to agitation however her blood pressure remains elevated on at the skilled medical facility.  I am going to increase her carvedilol to 6.25 mg twice daily for better heart rate and blood pressure control.  If she remains agitated a lot her blood pressure and heart rate will remain elevated and therefore adding  additional dosage to help to prevent significant rises in heart rate and BP.  4.  Hypercholesterolemia: Remains on atorvastatin 40 mg via PEG tube.  Current medicines are reviewed at length with the patient today.      Labs/ tests ordered today include: BMET, CBC.  Phill Myron. West Pugh, ANP, AACC   06/07/2017 1:32 PM    [facility] [address], [place], [place] [address] Phone: [phone]; Fax: [phone]

## 2017-06-07 ENCOUNTER — Ambulatory Visit (INDEPENDENT_AMBULATORY_CARE_PROVIDER_SITE_OTHER): Payer: Medicare Other | Admitting: Adult Health

## 2017-06-07 ENCOUNTER — Encounter: Payer: Self-pay | Admitting: Adult Health

## 2017-06-07 VITALS — BP 183/99 | HR 100 | Ht 60.0 in

## 2017-06-07 DIAGNOSIS — T17908S Unspecified foreign body in respiratory tract, part unspecified causing other injury, sequela: Secondary | ICD-10-CM

## 2017-06-07 DIAGNOSIS — Z79899 Other long term (current) drug therapy: Secondary | ICD-10-CM | POA: Diagnosis not present

## 2017-06-07 DIAGNOSIS — I639 Cerebral infarction, unspecified: Secondary | ICD-10-CM

## 2017-06-07 MED ORDER — CARVEDILOL 6.25 MG PO TABS: 3.1250 mg | ORAL_TABLET | Freq: Two times a day (BID) | ORAL | 11 refills | Status: DC

## 2017-06-07 MED ORDER — FUROSEMIDE 20 MG PO TABS: 20.0000 mg | ORAL_TABLET | Freq: Every day | ORAL | 3 refills | Status: DC

## 2017-06-07 NOTE — Patient Instructions (Signed)
Medication Instructions:  INCREASE CARVEDILOL 6.25MG  TWICE DAILY   If you need a refill on your cardiac medications before your next appointment, please call your pharmacy.  Labwork: CBC AND BMET TODAY HERE IN OUR OFFICE AT [facility]  Take the provided lab slips with you to the lab for your blood draw.   Testing/Procedures: DO PORTABLE CHEST XRAY AT FACILITY AND FAX RESULTS TO [phone]  Special Instructions: ELEVATE HEAD OF BED TO AT LEASE 45 DEGREES-FOR EASE FOR RESPIRATIONS  USE YANKAUER SUCTION FOR SECRETIONS AS NEEDED FOR EXCESS SECRETIONS   Follow-Up: Your physician wants you to follow-up in: [place] should receive a reminder letter in the mail two months in advance. If you do not receive a letter, please call our office SEPT 2019 to schedule the NOV 2019 follow-up appointment.   Thank you for choosing [facility] at [facility]!!

## 2017-06-08 ENCOUNTER — Ambulatory Visit: Payer: Medicare Other | Admitting: Neurology

## 2017-06-08 ENCOUNTER — Telehealth: Payer: Self-pay | Admitting: Adult Health

## 2017-06-08 ENCOUNTER — Encounter: Payer: Self-pay | Admitting: Adult Health

## 2017-06-08 ENCOUNTER — Ambulatory Visit (INDEPENDENT_AMBULATORY_CARE_PROVIDER_SITE_OTHER): Payer: Medicare Other | Admitting: Adult Health

## 2017-06-08 VITALS — BP 138/88 | HR 96

## 2017-06-08 DIAGNOSIS — E785 Hyperlipidemia, unspecified: Secondary | ICD-10-CM | POA: Diagnosis not present

## 2017-06-08 DIAGNOSIS — I6302 Cerebral infarction due to thrombosis of basilar artery: Secondary | ICD-10-CM | POA: Diagnosis not present

## 2017-06-08 DIAGNOSIS — I639 Cerebral infarction, unspecified: Secondary | ICD-10-CM | POA: Diagnosis not present

## 2017-06-08 DIAGNOSIS — I10 Essential (primary) hypertension: Secondary | ICD-10-CM | POA: Diagnosis not present

## 2017-06-08 LAB — CBC
HEMATOCRIT: 28.6 % — AB (ref 34.0–46.6)
Hemoglobin: 9 g/dL — ABNORMAL LOW (ref 11.1–15.9)
MCH: 27.1 pg (ref 26.6–33.0)
MCHC: 31.5 g/dL (ref 31.5–35.7)
MCV: 86 fL (ref 79–97)
Platelets: 513 10*3/uL — ABNORMAL HIGH (ref 150–379)
RBC: 3.32 x10E6/uL — ABNORMAL LOW (ref 3.77–5.28)
RDW: 16.3 % — AB (ref 12.3–15.4)
WBC: 6.3 10*3/uL (ref 3.4–10.8)

## 2017-06-08 LAB — BASIC METABOLIC PANEL
BUN/Creatinine Ratio: 22 (ref 12–28)
BUN: 15 mg/dL (ref 8–27)
CALCIUM: 9 mg/dL (ref 8.7–10.3)
CO2: 22 mmol/L (ref 20–29)
CREATININE: 0.67 mg/dL (ref 0.57–1.00)
Chloride: 109 mmol/L — ABNORMAL HIGH (ref 96–106)
GFR, EST AFRICAN AMERICAN: 93 mL/min/{1.73_m2} (ref >59)
GFR, EST NON AFRICAN AMERICAN: 81 mL/min/{1.73_m2} (ref >59)
Glucose: 86 mg/dL (ref 65–99)
Potassium: 4.2 mmol/L (ref 3.5–5.2)
Sodium: 143 mmol/L (ref 134–144)

## 2017-06-08 NOTE — Progress Notes (Signed)
[name] [address] [place]. [place] [address] [phone]       OFFICE FOLLOW UP NOTE  [name] Date of Birth:  [birth year] Medical Record Number:  [id number]   Reason for Referral:  hospital stroke follow up  CHIEF COMPLAINT:  Chief Complaint  Patient presents with  . Follow-up    PT is at [facility], Stroke follow up from hospital, [name] daughter is the POA is present     HPI: Alyssa Savage is being seen today for initial visit in the office for multiple strokes on 03/22/17 and 04/10/17. History obtained from patient, daughter and chart review. Reviewed all radiology images and labs personally.  Ms. Alyssa Savage is a 84 y.o. female with no PMH who presents with right sided weakness. CT scan showed acute nonhemorrhagic right basal ganglia infarct and old bilateral basal ganglia an thalamus lacunar infarcts. MRI head showed 1.5-2 cm acute infarction in the posterior left basal ganglia, posterior limb internal capsule and radiating white matter tracts. MRA head was negative for large vessel occlusion but did show widespread intracranial medium to small vessel atherosclerotic narrowing and irregularity. Bilateral carotid ultrasound showed bilateral ICA stenosis of 1-39%. 2D echo showed EF of 60-65%. LDL 117 and recommended start lipitor 40mg . A1c satisfactory at 6.3. No antithrombotic was prescribed prior to stroke and recommended aspirin 325mg . Patient did have fall a week prior which resulted in a fractured hip. Due to potential for difficult recovery in rehab due to hip pain and needed surgery, it was determined for patient to undergo surgery prior to d/c understanding risk-benefit. Patient underwent hip arthroplasty without complication. Patient had PEG tube placed due to continued dysphagia and aphasia. Patient discharged to SNF once stable condition. Patient did undergo cardiac monitoring to r/o AF. Results not available at this  time.  Patient returned to ED with 2 day history of aphasia on 04/19/17. MRI head reviewed and showed small acute infarct in the right paramedian pons. 2D echo showed EF of 60-65%. Aspirin was changed to Plavix and lipitor was continued. Etiology of stroke likely small vessel disease. Patient also found to have UTI and cultures positive for fungus so Diflucan was started. Patient discharged back to SNF once stable.  Patient returned to ED again on 05/27/17 for N/V, abdominal distention and diarrhea. UA continued to be positive for fungus and treatment for UTI initiated. Positive for C. Diff and started on vanco. Patient d/c'd to SNF once in stable condition.   Patient is being seen today for hospital follow-up and is accompanied by her daughter and 2 patient transported via stretcher as patient is not ambulatory.  She is currently residing at [facility] care center. Patient is expressively aphasic with right-sided hemiplegia and spasticity. Patient does complain of pain in her right leg from her knee down with any passive movement and mild pain in her upper extremity with passive movement.   Patient continues to take Plavix without side effects of bleeding or bruising.  Continues to take Lipitor without side effects of myalgias.  Patient's blood pressure initially 170/99 but rechecked manually 15 minutes later for 138/88.  Patient has returned back to facility on Saturday and her daughter, she has not received any therapies and is unsure if they have been ordered.  Daughter was seeing improvement prior to second hospitalization with use of therapies.  Daughter also wondering if patient can be prescribed pain medication for use prior to therapy as she had this before and it  helped her be able to participate better.  Patient is able to follow all commands and nods head yes/no appropriately.  PEG tube is in place for nutrition and medication administration due to dysphasia without complication and patient denies  pain from PEG tube insertion site.  Daughter denies new or worsening stroke/TIA symptoms.  ROS:   14 system review of systems performed and negative with exception of aching muscles, neck pain, moles, speech difficulty and facial drooping trouble swallowing, drooling, cough, chest tightness, swollen abdomen, diarrhea, daytime sleepiness, snoring, incontinence of bladder, blood in urine, joint pain, joint swelling,  PMH:  Past Medical History:  Diagnosis Date  . Femoral neck fracture ([place]) 03/2017   left   . GERD (gastroesophageal reflux disease)   . Hypertensive emergency 03/2017  . Hypokalemia 03/2017  . Stroke [facility])     PSH:  Past Surgical History:  Procedure Laterality Date  . ANTERIOR APPROACH HEMI HIP ARTHROPLASTY Left 03/25/2017   Procedure: ANTERIOR APPROACH HEMI HIP ARTHROPLASTY;  Surgeon: Rod Can, MD;  Location: [facility];  Service: Orthopedics;  Laterality: Left;  . IR GASTROSTOMY TUBE MOD SED  03/31/2017    Social History:  Social History   Socioeconomic History  . Marital status: Widowed    Spouse name: Not on file  . Number of children: Not on file  . Years of education: Not on file  . Highest education level: Not on file  Occupational History  . Not on file  Social Needs  . Financial resource strain: Not on file  . Food insecurity:    Worry: Not on file    Inability: Not on file  . Transportation needs:    Medical: Not on file    Non-medical: Not on file  Tobacco Use  . Smoking status: Never Smoker  . Smokeless tobacco: Never Used  Substance and Sexual Activity  . Alcohol use: No    Frequency: Never  . Drug use: No  . Sexual activity: Not on file  Lifestyle  . Physical activity:    Days per week: Not on file    Minutes per session: Not on file  . Stress: Not on file  Relationships  . Social connections:    Talks on phone: Not on file    Gets together: Not on file    Attends religious service: Not on file    Active member of club or  organization: Not on file    Attends meetings of clubs or organizations: Not on file    Relationship status: Not on file  . Intimate partner violence:    Fear of current or ex partner: Not on file    Emotionally abused: Not on file    Physically abused: Not on file    Forced sexual activity: Not on file  Other Topics Concern  . Not on file  Social History Narrative   Widowed   + children   No EtOH, tobacco    Family History:  Family History  Problem Relation Age of Onset  . CAD Mother   . Heart disease Mother     Medications:   Current Outpatient Medications on File Prior to Visit  Medication Sig Dispense Refill  . Amino Acids-Protein Hydrolys (FEEDING SUPPLEMENT, PRO-STAT SUGAR FREE 64,) LIQD Place 30 mLs into feeding tube daily.    Marland Kitchen amLODipine (NORVASC) 5 MG tablet Take 1 tablet (5 mg total) by mouth daily. (Patient taking differently: Place 5 mg into feeding tube daily. ) 30 tablet 0  .  atorvastatin (LIPITOR) 40 MG tablet Place 1 tablet (40 mg total) into feeding tube daily at 6 PM. 30 tablet 0  . bisacodyl (DULCOLAX) 10 MG suppository Place 1 suppository (10 mg total) rectally daily as needed for moderate constipation. 12 suppository 0  . carboxymethylcellulose (REFRESH PLUS) 0.5 % SOLN Place 1 drop into both eyes every 8 (eight) hours as needed (dry eyes).    . carvedilol (COREG) 6.25 MG tablet Place 0.5 tablets (3.125 mg total) into feeding tube 2 (two) times daily with a meal. 30 tablet 11  . cetirizine (ZYRTEC) 10 MG tablet Place 10 mg into feeding tube daily.    . cholestyramine (QUESTRAN) 4 g packet Take 4 g by mouth 2 (two) times daily.    . clopidogrel (PLAVIX) 75 MG tablet Place 1 tablet (75 mg total) into feeding tube daily. 30 tablet 0  . clotrimazole (GYNE-LOTRIMIN) 1 % vaginal cream Place 1 Applicatorful vaginally at bedtime. 45 g 0  . fluconazole (DIFLUCAN) 100 MG tablet Place 100 mg into feeding tube daily.     . nitroGLYCERIN (NITROSTAT) 0.4 MG SL tablet  Place 1 tablet (0.4 mg total) under the tongue every 5 (five) minutes as needed for chest pain. 30 tablet 0  . Nutritional Supplements (FEEDING SUPPLEMENT, OSMOLITE 1.2 CAL,) LIQD Place 1,000 mLs into feeding tube continuous. (Patient taking differently: Place 237 mLs into feeding tube 4 (four) times daily. ) 1000 mL 5  . nystatin (MYCOSTATIN) 100000 UNIT/ML suspension Take 5 mLs (500,000 Units total) by mouth 4 (four) times daily. 60 mL 0  . ranitidine (ZANTAC) 150 MG/10ML syrup Place 10 mLs (150 mg total) into feeding tube daily. 300 mL 0  . vancomycin (VANCOCIN) 50 mg/mL oral solution Place 2.5 mLs (125 mg total) into feeding tube every 6 (six) hours for 10 days. 100 mL 0  . Water For Irrigation, Sterile (FREE WATER) SOLN Place 200 mLs into feeding tube every 6 (six) hours. (Patient taking differently: Place 200 mLs into feeding tube 4 (four) times daily. ) 1000 mL 5   No current facility-administered medications on file prior to visit.     Allergies:  No Known Allergies   Physical Exam  Vitals:   06/08/17 1321 06/08/17 1345  BP: (!) 170/99 138/88  Pulse: 96    There is no height or weight on file to calculate BMI. No exam data present  General: well developed, pleasant elderly African-American female, well nourished, seated, in no evident distress Head: head normocephalic and atraumatic.   Neck: supple with no carotid or supraclavicular bruits Cardiovascular: regular rate and rhythm, no murmurs Musculoskeletal: no deformity Skin:  no rash/petichiae; PEG tube in place without signs of redness or warmth; skin is dry and flaky Vascular:  Normal pulses all extremities  Neurologic Exam Mental Status: Awake and fully alert.  Unable to test memory due to expressive aphasia.  Attention span, concentration and fund of knowledge appropriate. Mood and affect appropriate.  Cranial Nerves: Fundoscopic exam reveals sharp disc margins. Pupils equal, briskly reactive to light. Extraocular  movements full without nystagmus. Visual fields full to confrontation. Hearing intact. Facial sensation intact.  Right-sided facial paralysis. Motor: Decreased muscle bulk and tone in all extremities; 0/5 right upper and right lower extremity; 5/5 left upper extremity; 4/5 left lower extremity; spasticity present in right upper extremity Sensory.: intact to touch , pinprick , position and vibratory sensation.  Coordination: Rapid alternating movements normal in left upper extremity.  finger-to-nose and heel-to-shin performed accurately on left side  Gait and Station: Patient is currently laying in stretcher and is not ambulatory at this time Reflexes: 1+ and symmetric on the left side ; brisk reflexes on right upper and lower extremity; toes downgoing.    NIHSS  13 Modified Rankin  5   Diagnostic Data (Labs, Imaging, Testing)  Ct Head Wo Contrast Result Date: 03/21/2017 IMPRESSION: 1. Acute nonhemorrhagic RIGHT basal ganglia infarct. 2. Old bilateral basal ganglia and thalamus lacunar infarcts. Moderate to severe chronic small vessel ischemic disease. 3. Acute findings discussed with and reconfirmed by Dr.JON KNAPP on 03/21/2017 at 8:44 pm. Electronically Signed   By: Elon Alas M.D.   On: 03/21/2017 20:45   Mr Brain Lottie Dawson Contrast Mr Jodene Nam Head Wo Contrast Result Date: 03/22/2017  IMPRESSION: 1.5-2 cm acute infarction in the posterior left basal ganglia, posterior limb internal capsule and radiating white matter tracts. No mass effect or hemorrhage. Widespread chronic small-vessel ischemic changes elsewhere throughout the brain as outlined above. No large vessel occlusion or correctable proximal stenosis. Widespread intracranial medium to small vessel atherosclerotic narrowing and irregularity. Electronically Signed   By: Nelson Chimes M.D.   On: 03/22/2017 11:49   Echocardiogram:                        Left ventricle: The cavity size was normal. There was moderate concentric hypertrophy.  Systolic function was normal. The estimated ejection fraction was in the range of 60% to 65%. Wall motion was normal; there were no regional wall motion abnormalities  CT HEAD WO CONTRAST Study date: 04/19/17 IMPRESSION: Chronic ischemic microangiopathy without acute intracranial abnormality. Old left gangliocapsular lacunar infarct.  MR BRAIN WO CONTRAST 04/20/17 IMPRESSION: 1. Small acute infarct of the right paramedian pons. 2. Subacute infarct of the left basal ganglia, unchanged in size. 3. Numerous scattered chronic microhemorrhages throughout the brain, unchanged. This could indicate chronic hypertensive microangiopathy, amyloid angiopathy or a combination of the two.     ASSESSMENT: Alyssa Savage is a 84 y.o. year old female here with multiple strokes on 03/22/17 (cryptogenic) and 04/10/17 secondary to small vessel disease. Vascular risk factors include HTN, HLD and previous strokes.   PLAN: -Continue clopidogrel 75 mg daily  and lipitor  for secondary stroke prevention -F/u with PCP regarding your HLD and HTN management -continue to monitor BP at home -PT/OT/ST orders placed -30 day cardiac monitor completed - follow up on result -Maintain strict control of hypertension with blood pressure goal below 130/90, diabetes with hemoglobin A1c goal below 6.5% and cholesterol with LDL cholesterol (bad cholesterol) goal below 70 mg/dL. I also advised the patient to eat a healthy diet with plenty of whole grains, cereals, fruits and vegetables, exercise regularly and maintain ideal body weight.  Follow up in 4 months or call earlier if needed  Greater than 50% time during this 25 minute consultation visit was spent on counseling and coordination of care about HTN, and HLD, discussion about risk benefit of anticoagulation and answering questions.   Venancio Poisson, AGNP-BC  [facility] [address] [place], [place] [address]  Phone  [phone] Fax [phone]

## 2017-06-08 NOTE — Patient Instructions (Signed)
Continue clopidogrel 75 mg daily  and lipitor  for secondary stroke prevention  Continue to follow up with PCP regarding HLD and HTN management   Will place orders for PT/OT/ST if orders are not already placed  Continue to monitor blood pressure at home  Maintain strict control of hypertension with blood pressure goal below 130/90, diabetes with hemoglobin A1c goal below 6.5% and cholesterol with LDL cholesterol (bad cholesterol) goal below 70 mg/dL. I also advised the patient to eat a healthy diet with plenty of whole grains, cereals, fruits and vegetables, exercise regularly and maintain ideal body weight.  Followup in the future with me in 4 months or call earlier if needed       Thank you for coming to see [place] at [facility]. I hope we have been able to provide you high quality care today.  You may receive a patient satisfaction survey over the next few weeks. We would appreciate your feedback and comments so that we may continue to improve ourselves and the health of our patients.

## 2017-06-08 NOTE — Progress Notes (Signed)
I agree with the above plan 

## 2017-06-08 NOTE — Telephone Encounter (Signed)
5/8- pt stated at check out that she would like to wait to schedule 4 month follow up, she will call

## 2017-06-24 ENCOUNTER — Non-Acute Institutional Stay (SKILLED_NURSING_FACILITY): Payer: Medicare Other | Admitting: Internal Medicine

## 2017-06-24 ENCOUNTER — Encounter: Payer: Self-pay | Admitting: Internal Medicine

## 2017-06-24 DIAGNOSIS — R1310 Dysphagia, unspecified: Secondary | ICD-10-CM | POA: Diagnosis not present

## 2017-06-24 DIAGNOSIS — J301 Allergic rhinitis due to pollen: Secondary | ICD-10-CM | POA: Diagnosis not present

## 2017-06-24 DIAGNOSIS — D649 Anemia, unspecified: Secondary | ICD-10-CM | POA: Diagnosis not present

## 2017-06-24 DIAGNOSIS — R9431 Abnormal electrocardiogram [ECG] [EKG]: Secondary | ICD-10-CM | POA: Diagnosis not present

## 2017-06-24 DIAGNOSIS — L89153 Pressure ulcer of sacral region, stage 3: Secondary | ICD-10-CM | POA: Diagnosis not present

## 2017-06-24 DIAGNOSIS — R945 Abnormal results of liver function studies: Secondary | ICD-10-CM

## 2017-06-24 DIAGNOSIS — R4701 Aphasia: Secondary | ICD-10-CM

## 2017-06-24 DIAGNOSIS — I69351 Hemiplegia and hemiparesis following cerebral infarction affecting right dominant side: Secondary | ICD-10-CM | POA: Diagnosis not present

## 2017-06-24 DIAGNOSIS — E785 Hyperlipidemia, unspecified: Secondary | ICD-10-CM | POA: Diagnosis not present

## 2017-06-24 DIAGNOSIS — I6930 Unspecified sequelae of cerebral infarction: Secondary | ICD-10-CM | POA: Diagnosis not present

## 2017-06-24 DIAGNOSIS — K529 Noninfective gastroenteritis and colitis, unspecified: Secondary | ICD-10-CM | POA: Diagnosis not present

## 2017-06-24 DIAGNOSIS — R7989 Other specified abnormal findings of blood chemistry: Secondary | ICD-10-CM

## 2017-06-24 DIAGNOSIS — E43 Unspecified severe protein-calorie malnutrition: Secondary | ICD-10-CM

## 2017-06-24 NOTE — Progress Notes (Signed)
:   Provider:  Noah Delaine. [name], MD Location:  [facility] Room Number: 420-W Place of Service:  SNF (31)  PCP: Patient, No Pcp Per Patient Care Team: Patient, No Pcp Per as PCP - General (General Practice) Minus Breeding, MD as PCP - Cardiology (Cardiology)  Extended Emergency Contact Information Primary Emergency Contact: cromati,eartha Home Phone: [phone] Relation: Daughter Secondary Emergency Contact: Lenore Cordia Mobile Phone: [phone] Relation: Son     Allergies: Patient has no known allergies.  Chief Complaint  Patient presents with  . New Admit To SNF    Admit to [facility]    HPI: Patient is 84 y.o. female with recent stroke with residual right-sided deficits, severe dysphasia with PEG tube in place, history of left hip fracture February 2019,  aphasia, hypertension, hyperlipidemia, chronic embolism and thrombosis, and abnormal liver functions, most recently hospitalized in early May for recurrent Pseudomonas UTI and C. difficile diarrhea treated with vancomycin who was admitted to [facility] rehab on 5/23 from [facility] for OT/PT and for wound care of a stage III sacral wound.  [facility] facility patient will be followed for her multiple problems as outlined below.  Past Medical History:  Diagnosis Date  . Abnormal liver function test   . Cerebral infarct ([place])   . Cognitive communication deficit   . Difficulty walking   . Dysphagia   . Femoral neck fracture ([place]) 03/2017   left   . Gastrostomy status ([place])   . GERD (gastroesophageal reflux disease)   . Hemiparesis ([place])   . Hemiplegia ([place])   . Hyperlipidemia   . Hypertensive emergency 03/2017  . Hypertensive heart disease   . Hypokalemia 03/2017  . Muscle weakness   . Other hyperlipidemia   . Pain in left hip   . Presence of artificial hip, left   . Respiratory symptoms   . Severe protein-calorie malnutrition  ([place])   . Stroke [facility])     Past Surgical History:  Procedure Laterality Date  . ANTERIOR APPROACH HEMI HIP ARTHROPLASTY Left 03/25/2017   Procedure: ANTERIOR APPROACH HEMI HIP ARTHROPLASTY;  Surgeon: Rod Can, MD;  Location: [facility];  Service: Orthopedics;  Laterality: Left;  . IR GASTROSTOMY TUBE MOD SED  03/31/2017    Allergies as of 06/24/2017   No Known Allergies     Medication List        Accurate as of 06/24/17  2:25 PM. Always use your most recent med list.          amLODipine 5 MG tablet Commonly known as:  NORVASC Take 1 tablet (5 mg total) by mouth daily.   atorvastatin 40 MG tablet Commonly known as:  LIPITOR Place 1 tablet (40 mg total) into feeding tube daily at 6 PM.   bisacodyl 10 MG suppository Commonly known as:  DULCOLAX Place 1 suppository (10 mg total) rectally daily as needed for moderate constipation.   carboxymethylcellulose 0.5 % Soln Commonly known as:  REFRESH PLUS Place 1 drop into both eyes every 8 (eight) hours as needed (dry eyes).   carvedilol 6.25 MG tablet Commonly known as:  COREG Place 0.5 tablets (3.125 mg total) into feeding tube 2 (two) times daily with a meal.   cetirizine 10 MG tablet Commonly known as:  ZYRTEC Place 10 mg into feeding tube daily.   cholestyramine 4 g packet Commonly known as:  QUESTRAN Take 4 g by mouth 2 (two) times daily.  clopidogrel 75 MG tablet Commonly known as:  PLAVIX Place 1 tablet (75 mg total) into feeding tube daily.   clotrimazole 1 % vaginal cream Commonly known as:  GYNE-LOTRIMIN Place 1 Applicatorful vaginally at bedtime.   feeding supplement (PRO-STAT SUGAR FREE 64) Liqd Place 30 mLs into feeding tube daily.   FLORASTOR 250 MG capsule Generic drug:  saccharomyces boulardii Take 250 mg by mouth. GIVE 1 CAPSULE VIA PEG TUBE DAILY AS SUPPLEMENT   furosemide 20 MG tablet Commonly known as:  LASIX Take 20 mg by mouth. GIVE 1 TABLET VIA PEG-TUBE DAILY FOR HYPERTENSIVE HEART  DISEASE WITH HEART FAILURE   nitroGLYCERIN 0.4 MG SL tablet Commonly known as:  NITROSTAT Place 1 tablet (0.4 mg total) under the tongue every 5 (five) minutes as needed for chest pain.   ranitidine 150 MG/10ML syrup Commonly known as:  ZANTAC Place 10 mLs (150 mg total) into feeding tube daily.       No orders of the defined types were placed in this encounter.    There is no immunization history on file for this patient.  Social History   Tobacco Use  . Smoking status: Never Smoker  . Smokeless tobacco: Never Used  Substance Use Topics  . Alcohol use: No    Frequency: Never    Family history is   Family History  Problem Relation Age of Onset  . CAD Mother   . Heart disease Mother       Review of Systems  DATA OBTAINED: from nurse; patient is a aphasia and can point to things and communicates this way but unable to get a review of systems from this GENERAL:  no fevers, fatigue, appetite changes SKIN: No itching, or rash EYES: No eye pain, redness, discharge EARS: No earache, tinnitus, change in hearing NOSE: No congestion, drainage or bleeding  MOUTH/THROAT: No mouth or tooth pain, No sore throat RESPIRATORY: No cough, wheezing, SOB CARDIAC: No chest pain, palpitations, lower extremity edema  GI: No abdominal pain, No N/V/D or constipation, No heartburn or reflux  GU: No dysuria, frequency or urgency, or incontinence  MUSCULOSKELETAL: No unrelieved bone/joint pain NEUROLOGIC: No headache, dizziness or focal weakness PSYCHIATRIC: No c/o anxiety or sadness   Vitals:   06/24/17 0958  BP: 140/80  Pulse: 90  Resp: 20  Temp: 98.8 F (37.1 C)    SpO2 Readings from Last 1 Encounters:  06/05/17 95%   There is no height or weight on file to calculate BMI.     Physical Exam  GENERAL APPEARANCE: Alert, non-conversant,  No acute distress.  SKIN: No diaphoresis rash HEAD: Normocephalic, atraumatic  EYES: Conjunctiva/lids clear. Pupils round, reactive.  EOMs intact.  EARS: External exam WNL, canals clear. Hearing grossly normal.  NOSE: No deformity or discharge.  MOUTH/THROAT: Lips w/o lesions  RESPIRATORY: Breathing is even, unlabored. Lung sounds are clear   CARDIOVASCULAR: Heart RRR no murmurs, rubs or gallops. No peripheral edema.   GASTROINTESTINAL: Abdomen is soft, non-tender, not distended w/ normal bowel sounds. GENITOURINARY: Bladder non tender, not distended  MUSCULOSKELETAL: No abnormal joints or musculature NEUROLOGIC:  Cranial nerves 2-12 grossly intact except a aphasia; right-sided weakness PSYCHIATRIC: Mood and affect appropriate to situation, no behavioral issues  Patient Active Problem List   Diagnosis Date Noted  . Clostridium difficile diarrhea   . Uterine leiomyoma   . Rectal bleeding   . Nausea and vomiting 05/28/2017  . Constipation   . Acute CVA (cerebrovascular accident) ([place]) 04/22/2017  . CKD (chronic  kidney disease), stage III ([place]) 04/20/2017  . Acute encephalopathy 04/19/2017  . UTI (urinary tract infection) 04/19/2017  . Dysphagia 04/19/2017  . Displaced fracture of left femoral neck ([place]) 03/25/2017  . Protein-calorie malnutrition, severe 03/24/2017  . Fall 03/21/2017  . Fracture of femoral neck, left, closed ([place]) 03/21/2017  . AKI (acute kidney injury) ([place]) 03/21/2017  . Hypertensive emergency 03/21/2017  . Elevated troponin 03/21/2017  . Hypokalemia 03/21/2017  . Stroke ([place]) 03/21/2017  . GERD (gastroesophageal reflux disease)   . Closed fracture of left hip ([place])   . Prolonged Q-T interval on ECG       Labs reviewed: Basic Metabolic Panel:    Component Value Date/Time   NA 143 06/07/2017 1428   K 4.2 06/07/2017 1428   CL 109 (H) 06/07/2017 1428   CO2 22 06/07/2017 1428   GLUCOSE 86 06/07/2017 1428   GLUCOSE 75 06/05/2017 1012   BUN 15 06/07/2017 1428   CREATININE 0.67 06/07/2017 1428   CALCIUM 9.0 06/07/2017 1428   PROT 5.2 (L) 06/02/2017 0454   ALBUMIN 1.7 (L) 06/02/2017  0454   AST 56 (H) 06/02/2017 0454   ALT 79 (H) 06/02/2017 0454   ALKPHOS 64 06/02/2017 0454   BILITOT 0.3 06/02/2017 0454   GFRNONAA 81 06/07/2017 1428   GFRAA 93 06/07/2017 1428    Recent Labs    03/25/17 0654 03/26/17 0215 03/27/17 0429 03/29/17 0917  06/02/17 0454 06/05/17 1012 06/07/17 1428  NA 146* 140 139 143   < > 143 144 143  K 3.4* 3.6 3.4* 3.7   < > 3.6 3.2* 4.2  CL 112* 109 109 109   < > 115* 115* 109*  CO2 23 21* 20* 24   < > 21* 22 22  GLUCOSE 150* 170* 178* 122*   < > 104* 75 86  BUN 23* 25* 30* 27*   < > 24* 14 15  CREATININE 1.41* 1.41* 1.55* 1.18*   < > 0.81 0.67 0.67  CALCIUM 8.4* 7.8* 7.8* 8.4*   < > 7.8* 8.2* 9.0  MG 1.6* 1.4* 1.9 1.7  --   --   --   --   PHOS 2.1* 2.4* 2.2*  --   --   --   --   --    < > = values in this interval not displayed.   Liver Function Tests: Recent Labs    05/30/17 0756 05/31/17 0341 06/02/17 0454  AST 48* 56* 56*  ALT 61* 68* 79*  ALKPHOS 69 64 64  BILITOT 0.3 0.4 0.3  PROT 5.1* 4.9* 5.2*  ALBUMIN 1.7* 1.8* 1.7*   Recent Labs    05/27/17 2346  LIPASE 72*   Recent Labs    04/19/17 1421  AMMONIA 22   CBC: Recent Labs    04/19/17 1421  04/24/17 0425 05/27/17 2346  06/02/17 0454 06/05/17 1012 06/07/17 1428  WBC 4.8   < > 6.8 13.9*   < > 8.3 6.5 6.3  NEUTROABS 3.0  --  4.3 11.2*  --   --   --   --   HGB 11.2*   < > 8.0* 10.1*   < > 7.9* 8.0* 9.0*  HCT 35.6*   < > 26.1* 32.4*   < > 25.0* 25.7* 28.6*  MCV 88.3   < > 89.4 88.3   < > 87.7 87.7 86  PLT 272   < > 334 377   < > 364 418* 513*   < > =  values in this interval not displayed.   Lipid Recent Labs    03/22/17 0344  CHOL 203*  HDL 68  LDLCALC 117*  TRIG 90    Cardiac Enzymes: Recent Labs    03/22/17 1418 05/27/17 2346 05/28/17 0533  TROPONINI 0.12* 0.04* <0.03   BNP: No results for input(s): BNP in the last 8760 hours. No results found for: [facility] Lab Results  Component Value Date   HGBA1C 6.3 (H) 03/22/2017   No results  found for: TSH Lab Results  Component Value Date   DUKGURKY70 623 04/19/2017   No results found for: FOLATE No results found for: IRON, TIBC, FERRITIN  Imaging and Procedures obtained prior to SNF admission: US Abdomen Complete  Result Date: 05/28/2017 CLINICAL DATA:  Initial evaluation for acute vomiting for 4 days. Gastric tube in place. EXAM: ABDOMEN ULTRASOUND COMPLETE COMPARISON:  Prior CT from 03/29/2017. FINDINGS: Gallbladder: Gallbladder sludge without cholelithiasis. Gallbladder wall measure within normal limits of 1.5 mm. No free pericholecystic fluid. No sonographic Murphy sign elicited on exam. Common bile duct: Diameter: 6.8 mm Liver: No focal lesion identified. Within normal limits in parenchymal echogenicity. Portal vein is patent on color Doppler imaging with normal direction of blood flow towards the liver. IVC: No abnormality visualized. Pancreas: Not visualized. Spleen: Size and appearance within normal limits. Right Kidney: Length: 9.1 cm. Increased echogenicity within the renal parenchyma, suggesting medical renal disease. No mass or hydronephrosis visualized. 4.3 x 4.3 x 4.8 cm and 1.6 x 1.4 x 1.4 cm cyst noted. Left Kidney: Not visualized. Abdominal aorta: No aneurysm visualized. Other findings: None. IMPRESSION: 1. Gallbladder sludge without cholelithiasis or evidence for acute cholecystitis. No biliary dilatation. 2. Increased echogenicity within the right renal parenchyma, suggesting medical renal disease. No hydronephrosis. Left kidney not visualized due to technical limitations. 3. Right renal cysts as above. Electronically Signed   By: Jeannine Boga M.D.   On: 05/28/2017 03:33   Dg Abd Acute W/chest  Result Date: 05/28/2017 CLINICAL DATA:  Vomiting and diarrhea. EXAM: DG ABDOMEN ACUTE W/ 1V CHEST COMPARISON:  Chest x-ray dated April 19, 2017. Abdominal x-ray dated April 20, 2017. FINDINGS: The patient is rotated to the left on the chest x-ray. Stable mild  cardiomegaly. Normal pulmonary vascularity. No focal consolidation, pleural effusion, or pneumothorax. No acute osseous abnormality. Gastrostomy tube remains in place. No dilated small bowel loops or air-fluid levels. Large colonic stool burden with large stool ball in the rectum. No pneumoperitoneum. Multiple large calcified fibroids are again noted within the pelvis. IMPRESSION: 1. Large colonic stool burden with large stool ball in the rectum, concerning for constipation with fecal impaction. 2. No evidence of bowel obstruction. 3.  No active cardiopulmonary disease. Electronically Signed   By: Titus Dubin M.D.   On: 05/28/2017 07:27     Not all labs, radiology exams or other studies done during hospitalization come through on my EPIC note; however they are reviewed by me.    Assessment and Plan  History of CVA with right-sided weakness and aphasia and dysphasia- patient with PEG to with tube feedings; prophylaxis with Plavix 75 mg per tube daily  History of transaminitis-worked up during patient's prior hospitalization in early May; no signs of cholecystitis on abdominal ultrasound CT abdomen and pelvis showed uterine fibroid but no other disease; we will follow-up CMP  Severe protein malnutrition-albumin 1.7 continue pro-stat twice daily  Prolonged QT interval on EKG- avoid  medications that prolong QT interval  Chronic normocytic anemia- hemoglobin baseline 8.6;  follow-up CBC  Hypertension-controlled: Continue Lasix 20 mg per tube daily, Norvasc 5 mg per tube daily Coreg 3.125 mg per tube twice daily  Stage III sacral decubitus right buttocks- present on admission to skilled nursing facility; wound care per Myriam Jacobson, her wound care nurse; continue pro-stat twice daily  Hyperlipidemia- not stated as uncontrolled; continue Lipitor 40 mg per tube daily  Allergic rhinitis-continue Zyrtec 10 mg per tube daily  Chronic diarrhea- continue cholestyramine 4 g per tube twice daily   Time  spent greater than 45 minutes;> 50% of time with patient was spent reviewing records, labs, tests and studies, counseling and developing plan of care  Webb Silversmith D. [name], MD

## 2017-06-26 ENCOUNTER — Encounter: Payer: Self-pay | Admitting: Internal Medicine

## 2017-06-26 DIAGNOSIS — J309 Allergic rhinitis, unspecified: Secondary | ICD-10-CM | POA: Insufficient documentation

## 2017-06-26 DIAGNOSIS — D649 Anemia, unspecified: Secondary | ICD-10-CM | POA: Insufficient documentation

## 2017-06-26 DIAGNOSIS — G8190 Hemiplegia, unspecified affecting unspecified side: Secondary | ICD-10-CM | POA: Insufficient documentation

## 2017-06-26 DIAGNOSIS — I6930 Unspecified sequelae of cerebral infarction: Secondary | ICD-10-CM | POA: Insufficient documentation

## 2017-06-26 DIAGNOSIS — R945 Abnormal results of liver function studies: Secondary | ICD-10-CM | POA: Insufficient documentation

## 2017-06-26 DIAGNOSIS — L89153 Pressure ulcer of sacral region, stage 3: Secondary | ICD-10-CM | POA: Insufficient documentation

## 2017-06-26 DIAGNOSIS — R4701 Aphasia: Secondary | ICD-10-CM | POA: Insufficient documentation

## 2017-06-26 DIAGNOSIS — E785 Hyperlipidemia, unspecified: Secondary | ICD-10-CM | POA: Insufficient documentation

## 2017-06-26 DIAGNOSIS — K529 Noninfective gastroenteritis and colitis, unspecified: Secondary | ICD-10-CM | POA: Insufficient documentation

## 2017-06-26 DIAGNOSIS — R7989 Other specified abnormal findings of blood chemistry: Secondary | ICD-10-CM | POA: Insufficient documentation

## 2017-06-29 ENCOUNTER — Encounter: Payer: Self-pay | Admitting: Internal Medicine

## 2017-06-29 NOTE — Progress Notes (Signed)
Location:  [facility] Room Number: [phone] Place of Service:  SNF [phone]) Alyssa Savage. [name], MD  Patient, No Pcp Per  Patient Care Team: Patient, No Pcp Per as PCP - General ([place]) Minus Breeding, MD as PCP - Cardiology (Cardiology)  Extended Emergency Contact Information Primary Emergency Contact: cromati,eartha Home Phone: [phone] Relation: Daughter Secondary Emergency Contact: Lenore Cordia Mobile Phone: [phone] Relation: Son    Allergies: Patient has no known allergies.  Chief Complaint  Patient presents with  . Acute Visit    HPI: Patient is 84 y.o. female who   Past Medical History:  Diagnosis Date  . Abnormal liver function test   . Cerebral infarct ([place])   . Cognitive communication deficit   . Difficulty walking   . Dysphagia   . Femoral neck fracture ([place]) 03/2017   left   . Gastrostomy status ([place])   . GERD (gastroesophageal reflux disease)   . Hemiparesis ([place])   . Hemiplegia ([place])   . Hyperlipidemia   . Hypertensive emergency 03/2017  . Hypertensive heart disease   . Hypokalemia 03/2017  . Muscle weakness   . Other hyperlipidemia   . Pain in left hip   . Presence of artificial hip, left   . Respiratory symptoms   . Severe protein-calorie malnutrition ([place])   . Stroke [facility])     Past Surgical History:  Procedure Laterality Date  . ANTERIOR APPROACH HEMI HIP ARTHROPLASTY Left 03/25/2017   Procedure: ANTERIOR APPROACH HEMI HIP ARTHROPLASTY;  Surgeon: Rod Can, MD;  Location: [facility];  Service: Orthopedics;  Laterality: Left;  . IR GASTROSTOMY TUBE MOD SED  03/31/2017    Allergies as of 06/29/2017   No Known Allergies     Medication List        Accurate as of 06/29/17  3:32 PM. Always use your most recent med list.          acetaminophen 325 MG tablet Commonly known as:  TYLENOL Take 650 mg by mouth every 6 (six) hours as needed. Notify MD if not relieved. Special Requirement -  Pre-Pain (Physician Order) Not to exceed 3000mg  in 24 hour period   amLODipine 5 MG tablet Commonly known as:  NORVASC Place 5 mg into feeding tube daily.   atorvastatin 40 MG tablet Commonly known as:  LIPITOR Place 1 tablet (40 mg total) into feeding tube daily at 6 PM.   bisacodyl 10 MG suppository Commonly known as:  DULCOLAX Place 1 suppository (10 mg total) rectally daily as needed for moderate constipation.   carboxymethylcellulose 0.5 % Soln Commonly known as:  REFRESH PLUS Place 1 drop into both eyes every 8 (eight) hours as needed (dry eyes).   carvedilol 6.25 MG tablet Commonly known as:  COREG Place 6.25 mg into feeding tube 2 (two) times daily with a meal. HYPERTENSIVE HEART DISEASE WITH HEART FAILURE   cetirizine 10 MG tablet Commonly known as:  ZYRTEC Place 10 mg into feeding tube daily.   cholestyramine 4 g packet Commonly known as:  QUESTRAN Place 4 g into feeding tube 2 (two) times daily.   clopidogrel 75 MG tablet Commonly known as:  PLAVIX Place 1 tablet (75 mg total) into feeding tube daily.   feeding supplement (OSMOLITE 1.5 CAL) Liqd Place 1,000 mLs into feeding tube 4 (four) times daily. 1425 Kcal and 66g Pro daily ( May cont Osmolite 1.2 until Osmolite 1.5 available)   feeding supplement (PRO-STAT SUGAR FREE 64) Liqd Place 30 mLs  into feeding tube daily.   FLORASTOR 250 MG capsule Generic drug:  saccharomyces boulardii 250 mg. GIVE 1 CAPSULE VIA PEG TUBE DAILY AS SUPPLEMENT   furosemide 20 MG tablet Commonly known as:  LASIX Place 20 mg into feeding tube daily. FOR HYPERTENSIVE HEART DISEASE WITH HEART FAILURE   nitroGLYCERIN 0.4 MG SL tablet Commonly known as:  NITROSTAT Place 1 tablet (0.4 mg total) under the tongue every 5 (five) minutes as needed for chest pain.   ranitidine 150 MG/10ML syrup Commonly known as:  ZANTAC Place 10 mLs (150 mg total) into feeding tube daily.   traMADol 50 MG tablet Commonly known as:  ULTRAM Take 50  mg by mouth 3 (three) times daily.       No orders of the defined types were placed in this encounter.    There is no immunization history on file for this patient.  Social History   Tobacco Use  . Smoking status: Never Smoker  . Smokeless tobacco: Never Used  Substance Use Topics  . Alcohol use: No    Frequency: Never    Review of Systems  DATA OBTAINED: from patient, nurse, medical record, family member GENERAL:  no fevers, fatigue, appetite changes SKIN: No itching, rash HEENT: No complaint RESPIRATORY: No cough, wheezing, SOB CARDIAC: No chest pain, palpitations, lower extremity edema  GI: No abdominal pain, No N/V/D or constipation, No heartburn or reflux  GU: No dysuria, frequency or urgency, or incontinence  MUSCULOSKELETAL: No unrelieved bone/joint pain NEUROLOGIC: No headache, dizziness  PSYCHIATRIC: No overt anxiety or sadness  Vitals:   06/29/17 1515  BP: 127/69  Pulse: 82  Resp: 18  Temp: 97.8 F (36.6 C)  SpO2: 95%   Body mass index is 19.06 kg/m. Physical Exam  GENERAL APPEARANCE: Alert, conversant, No acute distress  SKIN: No diaphoresis rash HEENT: Unremarkable RESPIRATORY: Breathing is even, unlabored. Lung sounds are clear   CARDIOVASCULAR: Heart RRR no murmurs, rubs or gallops. No peripheral edema  GASTROINTESTINAL: Abdomen is soft, non-tender, not distended w/ normal bowel sounds.  GENITOURINARY: Bladder non tender, not distended  MUSCULOSKELETAL: No abnormal joints or musculature NEUROLOGIC: Cranial nerves 2-12 grossly intact. Moves all extremities PSYCHIATRIC: Mood and affect appropriate to situation, no behavioral issues  Patient Active Problem List   Diagnosis Date Noted  . History of cerebrovascular accident (CVA) with residual deficit 06/26/2017  . Hemiparesis ([place]) 06/26/2017  . Abnormal liver function test 06/26/2017  . Normocytic anemia 06/26/2017  . Aphasia 06/26/2017  . Hyperlipidemia 06/26/2017  . Sacral decubitus  ulcer, stage III ([place]) 06/26/2017  . Chronic diarrhea 06/26/2017  . Allergic rhinitis 06/26/2017  . Clostridium difficile diarrhea   . Uterine leiomyoma   . Rectal bleeding   . Nausea and vomiting 05/28/2017  . Constipation   . Acute CVA (cerebrovascular accident) ([place]) 04/22/2017  . CKD (chronic kidney disease), stage III ([place]) 04/20/2017  . Acute encephalopathy 04/19/2017  . UTI (urinary tract infection) 04/19/2017  . Dysphagia 04/19/2017  . Displaced fracture of left femoral neck ([place]) 03/25/2017  . Protein-calorie malnutrition, severe 03/24/2017  . Fall 03/21/2017  . Fracture of femoral neck, left, closed ([place]) 03/21/2017  . AKI (acute kidney injury) ([place]) 03/21/2017  . Hypertensive emergency 03/21/2017  . Elevated troponin 03/21/2017  . Hypokalemia 03/21/2017  . Stroke ([place]) 03/21/2017  . GERD (gastroesophageal reflux disease)   . Closed fracture of left hip ([place])   . Prolonged Q-T interval on ECG     CMP     Component  Value Date/Time   NA 143 06/07/2017 1428   K 4.2 06/07/2017 1428   CL 109 (H) 06/07/2017 1428   CO2 22 06/07/2017 1428   GLUCOSE 86 06/07/2017 1428   GLUCOSE 75 06/05/2017 1012   BUN 15 06/07/2017 1428   CREATININE 0.67 06/07/2017 1428   CALCIUM 9.0 06/07/2017 1428   PROT 5.2 (L) 06/02/2017 0454   ALBUMIN 1.7 (L) 06/02/2017 0454   AST 56 (H) 06/02/2017 0454   ALT 79 (H) 06/02/2017 0454   ALKPHOS 64 06/02/2017 0454   BILITOT 0.3 06/02/2017 0454   GFRNONAA 81 06/07/2017 1428   GFRAA 93 06/07/2017 1428   Recent Labs    03/25/17 0654 03/26/17 0215 03/27/17 0429 03/29/17 0917  06/02/17 0454 06/05/17 1012 06/07/17 1428  NA 146* 140 139 143   < > 143 144 143  K 3.4* 3.6 3.4* 3.7   < > 3.6 3.2* 4.2  CL 112* 109 109 109   < > 115* 115* 109*  CO2 23 21* 20* 24   < > 21* 22 22  GLUCOSE 150* 170* 178* 122*   < > 104* 75 86  BUN 23* 25* 30* 27*   < > 24* 14 15  CREATININE 1.41* 1.41* 1.55* 1.18*   < > 0.81 0.67 0.67  CALCIUM 8.4* 7.8* 7.8* 8.4*    < > 7.8* 8.2* 9.0  MG 1.6* 1.4* 1.9 1.7  --   --   --   --   PHOS 2.1* 2.4* 2.2*  --   --   --   --   --    < > = values in this interval not displayed.   Recent Labs    05/30/17 0756 05/31/17 0341 06/02/17 0454  AST 48* 56* 56*  ALT 61* 68* 79*  ALKPHOS 69 64 64  BILITOT 0.3 0.4 0.3  PROT 5.1* 4.9* 5.2*  ALBUMIN 1.7* 1.8* 1.7*   Recent Labs    04/19/17 1421  04/24/17 0425 05/27/17 2346  06/02/17 0454 06/05/17 1012 06/07/17 1428  WBC 4.8   < > 6.8 13.9*   < > 8.3 6.5 6.3  NEUTROABS 3.0  --  4.3 11.2*  --   --   --   --   HGB 11.2*   < > 8.0* 10.1*   < > 7.9* 8.0* 9.0*  HCT 35.6*   < > 26.1* 32.4*   < > 25.0* 25.7* 28.6*  MCV 88.3   < > 89.4 88.3   < > 87.7 87.7 86  PLT 272   < > 334 377   < > 364 418* 513*   < > = values in this interval not displayed.   Recent Labs    03/22/17 0344  CHOL 203*  LDLCALC 117*  TRIG 90   No results found for: MICROALBUR No results found for: TSH Lab Results  Component Value Date   HGBA1C 6.3 (H) 03/22/2017   Lab Results  Component Value Date   CHOL 203 (H) 03/22/2017   HDL 68 03/22/2017   LDLCALC 117 (H) 03/22/2017   TRIG 90 03/22/2017   CHOLHDL 3.0 03/22/2017    Significant Diagnostic Results in last 30 days:  Ct Abdomen Pelvis W Contrast  Result Date: 05/31/2017 CLINICAL DATA:  Abdominal pain, GI bleeding EXAM: CT ABDOMEN AND PELVIS WITH CONTRAST TECHNIQUE: Multidetector CT imaging of the abdomen and pelvis was performed using the standard protocol following bolus administration of intravenous contrast. CONTRAST:  141mL OMNIPAQUE IOHEXOL 300 MG/ML  SOLN COMPARISON:  CT abdomen dated 03/29/2017 FINDINGS: Motion degraded images. Lower chest: Small bilateral pleural effusions. Mild dependent atelectasis in the bilateral lower lobes. Hepatobiliary: Liver is within normal limits. Gallbladder is unremarkable. No intrahepatic or extrahepatic ductal dilatation. Pancreas: Within normal limits. Spleen: Within normal limits.  Adrenals/Urinary Tract: Adrenal glands are within normal limits. Bilateral renal cysts, measuring up to 5.0 cm in the posterior right lower kidney (series 3/image 35). No hydronephrosis. Bladder is within normal limits. Stomach/Bowel: Stomach is notable for a percutaneous gastrostomy in satisfactory position. No evidence of bowel obstruction. Appendix is not discretely visualized. No definite colonic wall thickening is evident on CT, noting motion degradation and streak artifact. Vascular/Lymphatic: No evidence of abdominal aortic aneurysm. Atherosclerotic calcifications of the abdominal aorta and branch vessels. No suspicious abdominopelvic lymphadenopathy. Reproductive: Markedly enlarged uterus with numerous fibroids, many of which are calcified. No adnexal masses. Other: No abdominopelvic ascites. Musculoskeletal: Mild degenerative changes of the visualized thoracolumbar spine. Streak artifact overlying the pelvis from the patient's left hip prosthesis. IMPRESSION: Limited evaluation secondary to motion degradation and streak artifact. No definite colonic wall thickening on CT. No evidence of bowel obstruction. Small bilateral pleural effusions. Large fibroid uterus. Additional ancillary findings as above. Electronically Signed   By: Julian Hy M.D.   On: 05/31/2017 20:20   Dg Chest Port 1 View  Result Date: 06/05/2017 CLINICAL DATA:  [place].  Shortness of breath.  Difficulty breathing. EXAM: PORTABLE CHEST 1 VIEW COMPARISON:  05/28/2017 FINDINGS: Enlarged cardiac silhouette. Calcific atherosclerotic disease and tortuosity of the aorta. Mediastinal contours appear intact. There is no evidence of focal airspace consolidation, pleural effusion or pneumothorax. Increased pulmonary vascularity. Osseous structures are without acute abnormality. Soft tissues are grossly normal. IMPRESSION: Enlarged cardiac silhouette. Interstitial pulmonary edema. Electronically Signed   By: Fidela Salisbury M.D.   On:  06/05/2017 14:06    Assessment and Plan  No problem-specific Assessment & Plan notes found for this encounter.   Labs/tests ordered:    Alyssa Savage. [name], MD

## 2017-07-06 ENCOUNTER — Telehealth: Payer: Self-pay

## 2017-07-06 NOTE — Telephone Encounter (Signed)
Notes recorded by Marval Regal, RN on 07/06/2017 at 5:00 PM EDT Rn call patients daughter that her moms cardiac monitor showed no evidence of atrial fibrillation or significant arrhythmias. The daughter verbalized understanding. ------

## 2017-07-06 NOTE — Telephone Encounter (Signed)
-----   Message from Garvin Fila, MD sent at 07/05/2017  5:35 PM EDT ----- Alyssa Savage inform the patient that cardiac event monitor showed no evidence of atrial fibrillation or significant arrhythmias

## 2017-07-09 ENCOUNTER — Encounter (HOSPITAL_COMMUNITY): Payer: Self-pay | Admitting: Emergency Medicine

## 2017-07-09 ENCOUNTER — Emergency Department (HOSPITAL_COMMUNITY): Payer: Medicare Other

## 2017-07-09 ENCOUNTER — Inpatient Hospital Stay (HOSPITAL_COMMUNITY)
Admission: EM | Admit: 2017-07-09 | Discharge: 2017-07-11 | DRG: 640 | Disposition: A | Discharge status: Skilled Nursing Facility | Payer: Medicare Other | Attending: Internal Medicine | Admitting: Internal Medicine

## 2017-07-09 DIAGNOSIS — E43 Unspecified severe protein-calorie malnutrition: Secondary | ICD-10-CM | POA: Diagnosis present

## 2017-07-09 DIAGNOSIS — I69391 Dysphagia following cerebral infarction: Secondary | ICD-10-CM

## 2017-07-09 DIAGNOSIS — E7849 Other hyperlipidemia: Secondary | ICD-10-CM | POA: Diagnosis present

## 2017-07-09 DIAGNOSIS — E870 Hyperosmolality and hypernatremia: Principal | ICD-10-CM | POA: Diagnosis present

## 2017-07-09 DIAGNOSIS — I130 Hypertensive heart and chronic kidney disease with heart failure and stage 1 through stage 4 chronic kidney disease, or unspecified chronic kidney disease: Secondary | ICD-10-CM | POA: Diagnosis present

## 2017-07-09 DIAGNOSIS — I69351 Hemiplegia and hemiparesis following cerebral infarction affecting right dominant side: Secondary | ICD-10-CM

## 2017-07-09 DIAGNOSIS — T501X5A Adverse effect of loop [high-ceiling] diuretics, initial encounter: Secondary | ICD-10-CM | POA: Diagnosis present

## 2017-07-09 DIAGNOSIS — E876 Hypokalemia: Secondary | ICD-10-CM | POA: Diagnosis present

## 2017-07-09 DIAGNOSIS — K219 Gastro-esophageal reflux disease without esophagitis: Secondary | ICD-10-CM | POA: Diagnosis present

## 2017-07-09 DIAGNOSIS — E860 Dehydration: Secondary | ICD-10-CM | POA: Diagnosis present

## 2017-07-09 DIAGNOSIS — I444 Left anterior fascicular block: Secondary | ICD-10-CM | POA: Diagnosis present

## 2017-07-09 DIAGNOSIS — Z7902 Long term (current) use of antithrombotics/antiplatelets: Secondary | ICD-10-CM | POA: Diagnosis not present

## 2017-07-09 DIAGNOSIS — Z931 Gastrostomy status: Secondary | ICD-10-CM | POA: Diagnosis not present

## 2017-07-09 DIAGNOSIS — A0471 Enterocolitis due to Clostridium difficile, recurrent: Secondary | ICD-10-CM | POA: Diagnosis present

## 2017-07-09 DIAGNOSIS — I69320 Aphasia following cerebral infarction: Secondary | ICD-10-CM

## 2017-07-09 DIAGNOSIS — Z789 Other specified health status: Secondary | ICD-10-CM | POA: Diagnosis not present

## 2017-07-09 DIAGNOSIS — Z87440 Personal history of urinary (tract) infections: Secondary | ICD-10-CM | POA: Diagnosis not present

## 2017-07-09 DIAGNOSIS — R748 Abnormal levels of other serum enzymes: Secondary | ICD-10-CM | POA: Diagnosis present

## 2017-07-09 DIAGNOSIS — Z8249 Family history of ischemic heart disease and other diseases of the circulatory system: Secondary | ICD-10-CM

## 2017-07-09 DIAGNOSIS — L89152 Pressure ulcer of sacral region, stage 2: Secondary | ICD-10-CM | POA: Diagnosis present

## 2017-07-09 DIAGNOSIS — L89153 Pressure ulcer of sacral region, stage 3: Secondary | ICD-10-CM | POA: Diagnosis present

## 2017-07-09 DIAGNOSIS — R042 Hemoptysis: Secondary | ICD-10-CM | POA: Diagnosis present

## 2017-07-09 DIAGNOSIS — I509 Heart failure, unspecified: Secondary | ICD-10-CM | POA: Diagnosis present

## 2017-07-09 DIAGNOSIS — R778 Other specified abnormalities of plasma proteins: Secondary | ICD-10-CM | POA: Diagnosis present

## 2017-07-09 DIAGNOSIS — R41841 Cognitive communication deficit: Secondary | ICD-10-CM | POA: Diagnosis present

## 2017-07-09 DIAGNOSIS — Z96642 Presence of left artificial hip joint: Secondary | ICD-10-CM | POA: Diagnosis present

## 2017-07-09 DIAGNOSIS — R7989 Other specified abnormal findings of blood chemistry: Secondary | ICD-10-CM | POA: Diagnosis present

## 2017-07-09 DIAGNOSIS — I119 Hypertensive heart disease without heart failure: Secondary | ICD-10-CM | POA: Diagnosis present

## 2017-07-09 DIAGNOSIS — N183 Chronic kidney disease, stage 3 (moderate): Secondary | ICD-10-CM | POA: Diagnosis present

## 2017-07-09 LAB — CBC WITH DIFFERENTIAL/PLATELET
Abs Immature Granulocytes: 0.1 10*3/uL (ref 0.0–0.1)
Basophils Absolute: 0 10*3/uL (ref 0.0–0.1)
Basophils Relative: 0 %
EOS ABS: 0.2 10*3/uL (ref 0.0–0.7)
Eosinophils Relative: 2 %
HEMATOCRIT: 32.3 % — AB (ref 36.0–46.0)
HEMOGLOBIN: 9.6 g/dL — AB (ref 12.0–15.0)
IMMATURE GRANULOCYTES: 1 %
LYMPHS ABS: 1.9 10*3/uL (ref 0.7–4.0)
LYMPHS PCT: 18 %
MCH: 26.7 pg (ref 26.0–34.0)
MCHC: 29.7 g/dL — ABNORMAL LOW (ref 30.0–36.0)
MCV: 90 fL (ref 78.0–100.0)
MONOS PCT: 4 %
Monocytes Absolute: 0.4 10*3/uL (ref 0.1–1.0)
NEUTROS PCT: 75 %
Neutro Abs: 7.8 10*3/uL — ABNORMAL HIGH (ref 1.7–7.7)
Platelets: 465 10*3/uL — ABNORMAL HIGH (ref 150–400)
RBC: 3.59 MIL/uL — AB (ref 3.87–5.11)
RDW: 16 % — ABNORMAL HIGH (ref 11.5–15.5)
WBC: 10.4 10*3/uL (ref 4.0–10.5)

## 2017-07-09 LAB — COMPREHENSIVE METABOLIC PANEL
ALBUMIN: 2.6 g/dL — AB (ref 3.5–5.0)
ALT: 28 U/L (ref 14–54)
AST: 25 U/L (ref 15–41)
Alkaline Phosphatase: 70 U/L (ref 38–126)
Anion gap: 10 (ref 5–15)
BILIRUBIN TOTAL: 0.3 mg/dL (ref 0.3–1.2)
BUN: 61 mg/dL — AB (ref 6–20)
CO2: 21 mmol/L — ABNORMAL LOW (ref 22–32)
Calcium: 9 mg/dL (ref 8.9–10.3)
Chloride: 123 mmol/L — ABNORMAL HIGH (ref 101–111)
Creatinine, Ser: 0.85 mg/dL (ref 0.44–1.00)
GFR calc Af Amer: >60 mL/min (ref >60)
GLUCOSE: 120 mg/dL — AB (ref 65–99)
POTASSIUM: 3.9 mmol/L (ref 3.5–5.1)
Sodium: 154 mmol/L — ABNORMAL HIGH (ref 135–145)
TOTAL PROTEIN: 7.1 g/dL (ref 6.5–8.1)

## 2017-07-09 LAB — URINALYSIS, ROUTINE W REFLEX MICROSCOPIC
Bilirubin Urine: NEGATIVE
GLUCOSE, UA: NEGATIVE mg/dL
Hgb urine dipstick: NEGATIVE
KETONES UR: NEGATIVE mg/dL
LEUKOCYTES UA: NEGATIVE
NITRITE: NEGATIVE
PH: 5 (ref 5.0–8.0)
Protein, ur: NEGATIVE mg/dL
Specific Gravity, Urine: 1.016 (ref 1.005–1.030)

## 2017-07-09 LAB — TROPONIN I: TROPONIN I: 0.03 ng/mL — AB (ref <0.03)

## 2017-07-09 LAB — I-STAT CG4 LACTIC ACID, ED
LACTIC ACID, VENOUS: 1.98 mmol/L — AB (ref 0.5–1.9)
LACTIC ACID, VENOUS: 1.62 mmol/L (ref 0.5–1.9)

## 2017-07-09 LAB — MRSA PCR SCREENING: MRSA by PCR: NEGATIVE

## 2017-07-09 LAB — D-DIMER, QUANTITATIVE (NOT AT ARMC): D DIMER QUANT: 5.19 ug{FEU}/mL — AB (ref 0.00–0.50)

## 2017-07-09 MED ORDER — ORAL CARE MOUTH RINSE
15.0000 mL | Freq: Two times a day (BID) | OROMUCOSAL | Status: DC
  Administered 2017-07-10 – 2017-07-11 (×4): 15 mL via OROMUCOSAL

## 2017-07-09 MED ORDER — ONDANSETRON HCL 4 MG PO TABS: 4.0000 mg | ORAL_TABLET | Freq: Four times a day (QID) | ORAL | Status: DC | PRN

## 2017-07-09 MED ORDER — CLOPIDOGREL BISULFATE 75 MG PO TABS: 75.0000 mg | ORAL_TABLET | Freq: Every day | ORAL | Status: DC

## 2017-07-09 MED ORDER — SODIUM CHLORIDE 0.45 % IV SOLN: INTRAVENOUS | Status: DC

## 2017-07-09 MED ORDER — TRAMADOL HCL 50 MG PO TABS
50.0000 mg | ORAL_TABLET | Freq: Three times a day (TID) | ORAL | Status: DC
  Administered 2017-07-09 – 2017-07-11 (×7): 50 mg via ORAL
  Filled 2017-07-09 (×6): qty 1

## 2017-07-09 MED ORDER — DEXTROSE 5 % IV SOLN
INTRAVENOUS | Status: DC
  Administered 2017-07-09 – 2017-07-11 (×4): via INTRAVENOUS

## 2017-07-09 MED ORDER — PRO-STAT SUGAR FREE PO LIQD
30.0000 mL | Freq: Two times a day (BID) | ORAL | Status: DC
  Administered 2017-07-09 – 2017-07-11 (×4): 30 mL
  Filled 2017-07-09 (×4): qty 30

## 2017-07-09 MED ORDER — ONDANSETRON HCL 4 MG/2ML IJ SOLN: 4.0000 mg | Freq: Four times a day (QID) | INTRAMUSCULAR | Status: DC | PRN

## 2017-07-09 MED ORDER — IOPAMIDOL (ISOVUE-370) INJECTION 76%
INTRAVENOUS | Status: AC
  Filled 2017-07-09: qty 100

## 2017-07-09 MED ORDER — RANITIDINE HCL 150 MG/10ML PO SYRP
150.0000 mg | ORAL_SOLUTION | Freq: Every day | ORAL | Status: DC
  Filled 2017-07-09: qty 10

## 2017-07-09 MED ORDER — SACCHAROMYCES BOULARDII 250 MG PO CAPS
250.0000 mg | ORAL_CAPSULE | Freq: Two times a day (BID) | ORAL | Status: DC
Start: 2017-07-09 — End: 2017-07-12
  Administered 2017-07-09 – 2017-07-11 (×4): 250 mg
  Filled 2017-07-09 (×4): qty 1

## 2017-07-09 MED ORDER — IOPAMIDOL (ISOVUE-370) INJECTION 76%
INTRAVENOUS | Status: AC
  Administered 2017-07-09: 50 mL
  Filled 2017-07-09: qty 50

## 2017-07-09 MED ORDER — ACETAMINOPHEN 650 MG RE SUPP: 650.0000 mg | Freq: Four times a day (QID) | RECTAL | Status: DC | PRN

## 2017-07-09 MED ORDER — FREE WATER
200.0000 mL | Freq: Four times a day (QID) | Status: DC
  Administered 2017-07-09 – 2017-07-11 (×9): 200 mL

## 2017-07-09 MED ORDER — SODIUM CHLORIDE 0.9 % IV BOLUS
1000.0000 mL | Freq: Once | INTRAVENOUS | Status: AC
  Administered 2017-07-09: 1000 mL via INTRAVENOUS

## 2017-07-09 MED ORDER — POLYVINYL ALCOHOL 1.4 % OP SOLN
1.0000 [drp] | OPHTHALMIC | Status: DC | PRN
  Filled 2017-07-09: qty 15

## 2017-07-09 MED ORDER — ENSURE ENLIVE PO LIQD
237.0000 mL | Freq: Two times a day (BID) | ORAL | Status: DC
  Filled 2017-07-09: qty 237

## 2017-07-09 MED ORDER — CARBOXYMETHYLCELLULOSE SODIUM 0.5 % OP SOLN: 1.0000 [drp] | Freq: Three times a day (TID) | OPHTHALMIC | Status: DC | PRN

## 2017-07-09 MED ORDER — OSMOLITE 1.5 CAL PO LIQD
237.0000 mL | Freq: Four times a day (QID) | ORAL | Status: DC
  Administered 2017-07-09 – 2017-07-11 (×8): 237 mL
  Filled 2017-07-09 (×15): qty 237

## 2017-07-09 MED ORDER — SODIUM CHLORIDE 0.9% FLUSH
3.0000 mL | Freq: Two times a day (BID) | INTRAVENOUS | Status: DC
  Administered 2017-07-10: 3 mL via INTRAVENOUS

## 2017-07-09 MED ORDER — ENOXAPARIN SODIUM 40 MG/0.4ML ~~LOC~~ SOLN: 40.0000 mg | SUBCUTANEOUS | Status: DC

## 2017-07-09 MED ORDER — CHOLESTYRAMINE 4 G PO PACK
4.0000 g | PACK | Freq: Two times a day (BID) | ORAL | Status: DC
  Administered 2017-07-09 – 2017-07-11 (×4): 4 g
  Filled 2017-07-09 (×5): qty 1

## 2017-07-09 MED ORDER — FAMOTIDINE IN NACL 20-0.9 MG/50ML-% IV SOLN
20.0000 mg | Freq: Two times a day (BID) | INTRAVENOUS | Status: DC
  Administered 2017-07-09 – 2017-07-11 (×4): 20 mg via INTRAVENOUS
  Filled 2017-07-09 (×4): qty 50

## 2017-07-09 MED ORDER — ACETAMINOPHEN 500 MG PO TABS
1000.0000 mg | ORAL_TABLET | Freq: Once | ORAL | Status: DC
  Filled 2017-07-09: qty 2

## 2017-07-09 MED ORDER — TRAMADOL HCL 50 MG PO TABS
50.0000 mg | ORAL_TABLET | Freq: Once | ORAL | Status: AC
  Filled 2017-07-09: qty 1

## 2017-07-09 MED ORDER — ACETAMINOPHEN 325 MG PO TABS
650.0000 mg | ORAL_TABLET | Freq: Four times a day (QID) | ORAL | Status: DC | PRN
  Administered 2017-07-11: 650 mg
  Filled 2017-07-09: qty 2

## 2017-07-09 MED ORDER — CHLORHEXIDINE GLUCONATE 0.12 % MT SOLN
15.0000 mL | Freq: Two times a day (BID) | OROMUCOSAL | Status: DC
  Administered 2017-07-09 – 2017-07-11 (×4): 15 mL via OROMUCOSAL
  Filled 2017-07-09 (×4): qty 15

## 2017-07-09 NOTE — ED Provider Notes (Signed)
[facility] EMERGENCY DEPARTMENT Provider Note   CSN: [id number] Arrival date & time: 07/09/17  0847     History   Chief Complaint Chief Complaint  Patient presents with  . Shortness of Breath  . Leg Pain    HPI Alyssa Savage is a 84 y.o. female with a h/o of CVA with residual right-sided deficits, severe dysphasia with PEG tube in place, CKD stage III, history of left hip fracture February 2019, aphasia, hypertension, hyperlipidemia, chronic embolism and thrombosis, cognitive communication defect, stage III sacral wound and abnormal liver function.    Spoke with Verdene Rio, LPN, at [facility]. She reports staff found the patient to have an SaO2 89% on RA. She was placed on 2L [place] and sats improved. The patient coughed, and when staff suctioned her mouth, she was noted to have coffee-colored and white sputum so they called EMS. Staff denies, emesis, fever, or chills.  Family reports that there is concern that the patient has been self suctioning since she transferred to her current rehab facility.  The patient's cousin, Cecilie Lowers, reports generalized weakness and noted the patient to be "leaning" to her left side 4 day ago when he visited her.  Her symptoms resolved the next day and she was "100% and awake and talking", but reports that yesterday she seemed to be more generally weak.   At baseline, the patient has right-sided deficits from a previous CVA.  She is nonverbal, but communicates using a letter board.   Level 5 caveat secondary to patient is nonverbal.  The history is provided by the nursing home and a caregiver. No language interpreter was used.  Shortness of Breath  Associated symptoms include leg pain.  Leg Pain      Past Medical History:  Diagnosis Date  . Abnormal liver function test   . Cerebral infarct ([place])   . Cognitive communication deficit   . Difficulty walking   . Dysphagia   . Femoral neck fracture ([place]) 03/2017   left   .  Gastrostomy status ([place])   . GERD (gastroesophageal reflux disease)   . Hemiparesis ([place])   . Hemiplegia ([place])   . Hyperlipidemia   . Hypertensive emergency 03/2017  . Hypertensive heart disease   . Hypokalemia 03/2017  . Muscle weakness   . Other hyperlipidemia   . Pain in left hip   . Presence of artificial hip, left   . Respiratory symptoms   . Severe protein-calorie malnutrition ([place])   . Stroke [facility])     Patient Active Problem List   Diagnosis Date Noted  . Acute hypernatremia 07/09/2017  . Dysphagia as late effect of stroke 07/09/2017  . Cognitive communication deficit 07/09/2017  . History of recurrent UTIs 07/09/2017  . Recurrent Clostridium difficile diarrhea 07/09/2017  . Severe protein-calorie malnutrition ([place]) 07/09/2017  . Hypertensive heart disease/LV Diastolic dysfunction 29/92/4268  . Positive D dimer 07/09/2017  . Troponin level elevated 07/09/2017  . Sacral decubitus ulcer, stage II 07/09/2017  . History of cerebrovascular accident (CVA) with residual deficit 06/26/2017  . Hemiparesis ([place]) 06/26/2017  . Abnormal liver function test 06/26/2017  . Normocytic anemia 06/26/2017  . Aphasia 06/26/2017  . Hyperlipidemia 06/26/2017  . Sacral decubitus ulcer, stage III ([place]) 06/26/2017  . Chronic diarrhea 06/26/2017  . Allergic rhinitis 06/26/2017  . Clostridium difficile diarrhea   . Uterine leiomyoma   . Rectal bleeding   . Nausea and vomiting 05/28/2017  . Constipation   . Acute CVA (cerebrovascular  accident) ([place]) 04/22/2017  . CKD (chronic kidney disease), stage III ([place]) 04/20/2017  . Acute encephalopathy 04/19/2017  . UTI (urinary tract infection) 04/19/2017  . Dysphagia 04/19/2017  . Displaced fracture of left femoral neck ([place]) 03/25/2017  . Protein-calorie malnutrition, severe 03/24/2017  . Fall 03/21/2017  . Fracture of femoral neck, left, closed ([place]) 03/21/2017  . AKI (acute kidney injury) ([place]) 03/21/2017  . Hypertensive emergency  03/21/2017  . Elevated troponin 03/21/2017  . Hypokalemia 03/21/2017  . Stroke ([place]) 03/21/2017  . GERD (gastroesophageal reflux disease)   . Closed fracture of left hip ([place])   . Prolonged Q-T interval on ECG     Past Surgical History:  Procedure Laterality Date  . ANTERIOR APPROACH HEMI HIP ARTHROPLASTY Left 03/25/2017   Procedure: ANTERIOR APPROACH HEMI HIP ARTHROPLASTY;  Surgeon: Rod Can, MD;  Location: [facility];  Service: Orthopedics;  Laterality: Left;  . IR GASTROSTOMY TUBE MOD SED  03/31/2017     OB History   None      Home Medications    Prior to Admission medications   Medication Sig Start Date End Date Taking? Authorizing Provider  acetaminophen (TYLENOL) 325 MG tablet Place 650 mg into feeding tube every 6 (six) hours as needed for mild pain or moderate pain. Notify MD if not relieved. Special Requirement - Pre-Pain (Physician Order) Not to exceed 3000mg  in 24 hour period    Yes [provider]  Amino Acids-Protein Hydrolys (FEEDING SUPPLEMENT, PRO-STAT SUGAR FREE 64,) LIQD Place 30 mLs into feeding tube 2 (two) times daily.    Yes [provider]  amLODipine (NORVASC) 5 MG tablet Place 5 mg into feeding tube daily.   Yes [provider]  atorvastatin (LIPITOR) 40 MG tablet Place 1 tablet (40 mg total) into feeding tube daily at 6 PM. 04/02/17  Yes Starla Link, Kshitiz, MD  bisacodyl (DULCOLAX) 10 MG suppository Place 1 suppository (10 mg total) rectally daily as needed for moderate constipation. 06/04/17  Yes Kayleen Memos, DO  carboxymethylcellulose (REFRESH PLUS) 0.5 % SOLN Place 1 drop into both eyes every 8 (eight) hours as needed (dry eyes).   Yes [provider]  carvedilol (COREG) 6.25 MG tablet Place 6.25 mg into feeding tube 2 (two) times daily with a meal. HYPERTENSIVE HEART DISEASE WITH HEART FAILURE   Yes [provider]  cetirizine (ZYRTEC) 10 MG tablet Place 10 mg into feeding tube daily.   Yes [provider]   cholestyramine (QUESTRAN) 4 g packet Place 4 g into feeding tube 2 (two) times daily. Mixed with 4 ounces of water   Yes [provider]  clopidogrel (PLAVIX) 75 MG tablet Place 1 tablet (75 mg total) into feeding tube daily. 04/26/17  Yes Bonnell Public, MD  [facility]) [place] by Does not apply route. Boots to bilateral feet QD- while in bed-may remove for AM/PM care 7 am on/off 3 pm on/off 11 pm on/off   Yes [provider]  magnesium hydroxide (MILK OF MAGNESIA) 400 MG/5ML suspension Place 30 mLs into feeding tube daily as needed for mild constipation.   Yes [provider]  nitroGLYCERIN (NITROSTAT) 0.4 MG SL tablet Place 1 tablet (0.4 mg total) under the tongue every 5 (five) minutes as needed for chest pain. 04/02/17  Yes Aline August, MD  Nutritional Supplements (FEEDING SUPPLEMENT, OSMOLITE 1.5 CAL,) LIQD Place 1,000 mLs into feeding tube 4 (four) times daily. 1425 Kcal and 66g Pro daily ( May cont Osmolite 1.2 until  Osmolite 1.5 available)   Yes [provider]  Pollen Extracts (PROSTAT PO) 30 mLs by PEG Tube route 2 (two) times daily.   Yes [provider]  ranitidine (ZANTAC) 150 MG/10ML syrup Place 10 mLs (150 mg total) into feeding tube daily. 06/05/17  Yes Hall, Carole N, DO  saccharomyces boulardii (FLORASTOR) 250 MG capsule 250 mg. GIVE 1 CAPSULE VIA PEG TUBE DAILY AS SUPPLEMENT    Yes [provider]  traMADol (ULTRAM) 50 MG tablet Take 50 mg by mouth 3 (three) times daily.   Yes [provider]    Family History Family History  Problem Relation Age of Onset  . CAD Mother   . Heart disease Mother     Social History Social History   Tobacco Use  . Smoking status: Never Smoker  . Smokeless tobacco: Never Used  Substance Use Topics  . Alcohol use: No    Frequency: Never  . Drug use: No     Allergies   Patient has no known allergies.   Review of Systems Review of Systems  Unable to  perform ROS: Patient nonverbal     Physical Exam Updated Vital Signs BP 121/90   Pulse 100   Temp 97.9 F (36.6 C) (Rectal)   Resp 15   SpO2 100%   Physical Exam  Constitutional: She is oriented to [place], place, and time. No distress.  HENT:  Head: Normocephalic.  Tongue with white coating.  Clear secretions are noted in the patient's mouth.  Eyes: Pupils are equal, round, and reactive to light. Conjunctivae and EOM are normal. No scleral icterus.  Neck: Normal range of motion. Neck supple.  Cardiovascular: Normal rate, regular rhythm and normal heart sounds. Exam reveals no gallop and no friction rub.  No murmur heard. Pulmonary/Chest: Effort normal. No respiratory distress. She has no wheezes.  Abdominal: Soft. She exhibits no distension and no mass. There is no tenderness. There is no rebound and no guarding. No hernia.  PEG tube in place.   Musculoskeletal:  Right sided upper and lower extremity weakness at baseline.  Patient moves the left upper and lower extremities without difficulty.  Diffuse tenderness to palpation to the right knee.  No overlying erythema, edema, or warmth.  Neurological: She is alert and oriented to [place], place, and time.  Skin: Skin is warm. Capillary refill takes less than 2 seconds. No rash noted.  Psychiatric: Her behavior is normal.  Nursing note and vitals reviewed.    ED Treatments / Results  Labs (all labs ordered are listed, but only abnormal results are displayed) Labs Reviewed  CBC WITH DIFFERENTIAL/PLATELET - Abnormal; Notable for the following components:      Result Value   RBC 3.59 (*)    Hemoglobin 9.6 (*)    HCT 32.3 (*)    MCHC 29.7 (*)    RDW 16.0 (*)    Platelets 465 (*)    Neutro Abs 7.8 (*)    All other components within normal limits  URINALYSIS, ROUTINE W REFLEX MICROSCOPIC - Abnormal; Notable for the following components:   APPearance HAZY (*)    All other components within normal limits  COMPREHENSIVE  METABOLIC PANEL - Abnormal; Notable for the following components:   Sodium 154 (*)    Chloride 123 (*)    CO2 21 (*)    Glucose, Bld 120 (*)    BUN 61 (*)    Albumin 2.6 (*)    All other components within normal limits  TROPONIN I - Abnormal; Notable for the following components:   Troponin I 0.03 (*)    All other components within normal limits  D-DIMER, QUANTITATIVE (NOT AT [facility]) - Abnormal; Notable for the following components:   D-Dimer, Quant 5.19 (*)    All other components within normal limits  I-STAT CG4 LACTIC ACID, ED - Abnormal; Notable for the following components:   Lactic Acid, Venous 1.98 (*)    All other components within normal limits  URINE CULTURE  BASIC METABOLIC PANEL  I-STAT CG4 LACTIC ACID, ED    EKG EKG Interpretation  Date/Time:  Saturday July 09 2017 11:08:19 EDT Ventricular Rate:  102 PR Interval:    QRS Duration: 85 QT Interval:  350 QTC Calculation: 456 R Axis:   -50 Text Interpretation:  Sinus tachycardia Probable left atrial enlargement Left anterior fascicular block Abnormal R-wave progression, early transition Nonspecific T abnrm, anterolateral leads No significant change since last tracing Confirmed by Orlie Dakin [phone]) on 07/09/2017 11:21:27 AM Also confirmed by Orlie Dakin [phone]), editor Lynder Parents [phone])  on 07/09/2017 12:05:13 PM   Radiology Dg Chest 2 View  Result Date: 07/09/2017 CLINICAL DATA:  Shortness breath and hematemesis beginning this morning. EXAM: CHEST - 2 VIEW COMPARISON:  06/05/2017 FINDINGS: Stable borderline cardiomegaly. Stable ectasia of thoracic aorta. Both lungs are clear. No evidence of pleural effusion. IMPRESSION: Stable exam.  No active cardiopulmonary disease. Electronically Signed   By: Earle Gell M.D.   On: 07/09/2017 11:57   Ct Angio Chest Pe W And/or Wo Contrast  Result Date: 07/09/2017 CLINICAL DATA:  Shortness of breath hemoptysis EXAM: CT ANGIOGRAPHY CHEST WITH CONTRAST TECHNIQUE:  Multidetector CT imaging of the chest was performed using the standard protocol during bolus administration of intravenous contrast. Multiplanar CT image reconstructions and MIPs were obtained to evaluate the vascular anatomy. CONTRAST:  54mL ISOVUE-370 IOPAMIDOL (ISOVUE-370) INJECTION 76% COMPARISON:  Chest radiograph July 09, 2017 FINDINGS: Cardiovascular: There is no demonstrable pulmonary embolus. There is no thoracic aortic aneurysm, although the aorta is somewhat ectatic. No dissection seen. It should be noted that the contrast bolus is not optimal for assessment for potential dissection. Visualized great vessels appear normal except for minimal calcification in the proximal left subclavian artery. There is no appreciable pericardial effusion or pericardial thickening. There is left ventricular hypertrophy. Mediastinum/Nodes: Thyroid appears unremarkable. There is no appreciable thoracic adenopathy. There are foci of air throughout much of the esophagus. There is a small hiatal hernia. Lungs/Pleura: There is no lung edema or consolidation. No pleural effusion or pleural thickening evident. There is slight lower lobe bronchiectatic change bilaterally. Upper Abdomen: Limited visualization in the upper abdomen appears grossly unremarkable. Musculoskeletal: No blastic or lytic bone lesions are evident. No chest wall lesions evident. Review of the MIP images confirms the above findings. IMPRESSION: 1. No demonstrable pulmonary embolus. No thoracic aortic aneurysm. Aorta is somewhat ectatic. No dissection seen. It must be cautioned that the contrast bolus is not optimal in the aorta for assessment for potential dissection. 2.  There is left ventricular hypertrophy. 3. No edema or consolidation. Slight lower lobe bronchiectasis noted. 4.  No appreciable thoracic adenopathy. 5.  Small hiatal hernia.  Air noted throughout esophagus. Electronically Signed   By: Lowella Grip III M.D.   On: 07/09/2017 14:30     Procedures Procedures (including critical care time)  Medications Ordered in ED Medications  acetaminophen (TYLENOL) tablet 1,000 mg (1,000 mg Oral Not Given 07/09/17 1611)  traMADol (ULTRAM) tablet 50 mg (  has no administration in time range)  free water 200 mL (has no administration in time range)  feeding supplement (OSMOLITE 1.5 CAL) liquid 237 mL (has no administration in time range)  0.45 % sodium chloride infusion (has no administration in time range)  feeding supplement (PRO-STAT SUGAR FREE 64) liquid 30 mL (has no administration in time range)  carboxymethylcellulose (REFRESH PLUS) 0.5 % ophthalmic solution 1 drop (has no administration in time range)  cholestyramine (QUESTRAN) packet 4 g (has no administration in time range)  clopidogrel (PLAVIX) tablet 75 mg (has no administration in time range)  ranitidine (ZANTAC) 150 MG/10ML syrup 150 mg (has no administration in time range)  saccharomyces boulardii (FLORASTOR) capsule 250 mg (has no administration in time range)  traMADol (ULTRAM) tablet 50 mg (has no administration in time range)  sodium chloride flush (NS) 0.9 % injection 3 mL (has no administration in time range)  acetaminophen (TYLENOL) tablet 650 mg (has no administration in time range)    Or  acetaminophen (TYLENOL) suppository 650 mg (has no administration in time range)  ondansetron (ZOFRAN) tablet 4 mg (has no administration in time range)    Or  ondansetron (ZOFRAN) injection 4 mg (has no administration in time range)  iopamidol (ISOVUE-370) 76 % injection (50 mLs  Contrast Given 07/09/17 1330)  sodium chloride 0.9 % bolus 1,000 mL (1,000 mLs Intravenous New Bag/Given 07/09/17 1542)     Initial Impression / Assessment and Plan / ED Course  I have reviewed the triage vital signs and the nursing notes.  Pertinent labs & imaging results that were available during my care of the patient were reviewed by me and considered in my medical decision making (see chart for  details).  Clinical Course as of Jul 10 1714  Sat Jul 09, 2017  1014 Attempted call to [facility] rehab.  Was transferred to nurses desk with no answer.  Will reattempt shortly.   [MM]  1551 Patient rechecked.  Family has returned.  Lab results have been discussed including hyponatremia of 154 troponin of 0.03. IVFs initiated.    [MM]    Clinical Course User Index [MM] Joanne Gavel, PA-C    84 year old female with a h/o of CVA with residual right-sided deficits, severe dysphasia with PEG tube in place, CKD stage III, history of left hip fracture February 2019, aphasia, hypertension, hyperlipidemia, chronic embolism and thrombosis, cognitive communication defect, stage III sacral wound and abnormal liver function.  History is somewhat limited as the patient is nonverbal.  Staff apparently noted the patient to be satting at 89% on room air and noted dark secretions in the patient's mouth after she coughed.  Family also reports questionable waxing and waning generalized weakness over the last 4 days.  Patient was discussed and evaluated with Dr. Cathleen Fears, attending physician.  Patient has maintained an SaO2 of 97 to 100% on room air since arrival.  Secretions have been suctioned by nursing staff and have remained clear. She has been mildly tachycardic in the 100-110s since arrival.  Blood pressure is reassuring.  Afebrile with rectal temp.  The patient has severe dysphagia and has a PEG tube in place.  Family expresses concern for aspiration pneumonia.  Chest x-ray is clear.  Urinalysis was reassuring.  Lactate improved from 1.98 to 1.62 without treatment. EKG unchanged from previous.  Troponin minimally elevated at 0.03; she has had elevated troponins in the past, but during her last visit troponin was negative.  I suspect this is secondary  to demand from mild tachycardia.  Given new, unexplained tachycardia, d-dimer ordered.  This was elevated so a CT PE study was performed, which  was negative.  Doubt PE, ACS, or CVA.  Electrolytes with new hypernatremia of 154. IVF bolus given. Home tramadol given for pain via PEG tube. Spoke with Ebony Hail from the hospitalist team who will accept the admission. The patient appears reasonably stabilized for admission considering the current resources, flow, and capabilities available in the ED at this time, and I doubt any other [facility] requiring further screening and/or treatment in the ED prior to admission.   Final Clinical Impressions(s) / ED Diagnoses   Final diagnoses:  Acute hypernatremia  Elevated troponin    ED Discharge Orders    None       Suzzane Quilter A, PA-C 07/09/17 1716    Orlie Dakin, MD 07/10/17 [phone]

## 2017-07-09 NOTE — ED Provider Notes (Addendum)
Presents with hemoptysis this morning.  No hematemesis brought by EMS.  Patient denies pain anywhere except for right anterior knee.  She denies shortness of breath denies chest pain denies abdominal pain.  On exam frail-appearing lungs clear to auscultation heart regular rate and rhythm mildly tachycardic abdomen nondistended nontender gastrostomy tube in place with clear yellow material in gastrostomy tube extremities without, redness or warmth.  She has a flexion contracture at right knee.  Edema.  DP pulses 2+ bilaterally.  Good capillary refill.  No calf or thigh tenderness there is a stage III clean appearing decubitus ulcer at presacral area skin otherwise warm and dry   Orlie Dakin, MD 07/09/17 2119    Orlie Dakin, MD 07/09/17 1239

## 2017-07-09 NOTE — Progress Notes (Signed)
Pt admitted to [facility] with family at bedside.  Purwick for urine placed.  Ultram given via PEG and flushed with water.  O2/2L N/C.

## 2017-07-09 NOTE — Progress Notes (Signed)
Pressure Injury stage 3 to the right sacral area present on admission measuring 1cm x 3cm x 0.3 cm. No drainage or odor noted.  Wound bed is pink, no slough.  Cleansed the area with saline and dressed with 2x2 saline,covered with dry 2x2 and sacral foam dressing.  Mattress replacement ordered as well as prevalon boots.  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  Right sided deficits noted from an old CVA.  Oral care protocol initiated and oral care done with suction swab.  Tongue is coated white, will follow up tomorrow to see if comes off with oral care or if need something for thrush.

## 2017-07-09 NOTE — ED Notes (Signed)
Family at bedside. 

## 2017-07-09 NOTE — H&P (Signed)
History and Physical    Alyssa Savage KVQ:[id number] DOB: 1930/02/26 DOA: 07/09/2017  **Will admit patient based on the expectation that the patient will need hospitalization/ hospital care that crosses at least 2 midnights  PCP: Hennie Duos, MD   Attending physician: [place]  Patient coming from/Resides with: SNF  Chief Complaint: Shortness of breath and hematemesis  HPI: Alyssa Savage is a 84 y.o. female with medical history significant for history of CVA with resultant right side hemiplegia and severe dysphagia requiring a PEG tube.  Also history of hypertension with LV diastolic dysfunction, secondary expressive aphasia in context of prior stroke, stage II sacral decubitus, severe protein calorie malnutrition.  Also a history of recurrent UTIs and C. difficile colitis.  She was recently discharged on 5/5 after an admission for C. difficile colitis.  Patient was treated with oral vancomycin and based on discharge summary completed treatment on 5/15. According to family has not had any further issues with ongoing diarrhea.  She did have Hemoccult positive stools in the context of acute colitis during the previous admission.  Urine culture from 4/27 was positive for yeast and she was treated accordingly.  This is recurrent noting similar microbiology March 2019 urine culture.  Also documented she has a history of Pseudomonas UTI as well.  Patient was sent to the ER today the nursing facility reports of shortness of breath and coffee-ground emesis.  ER she was afebrile, mildly tachycardic with somewhat soft blood pressure readings. tni was mildly elevated at 0.03 and given her respiratory symptoms and tachycardia and question of hemoptysis d-dimer was obtained which was elevated at 5.19.  CTA the chest did not reveal PE and also did not reveal any edema or consolidation.  Lactic acid was borderline elevated at 1.98 but repeat was 1.62.  Patient did not have any leukocytosis.  Her sodium  was noted to be elevated at 154 with a BUN of 61 noting at time of discharge sodium was 143 and BUN was 15.  In review of outpatient documentation on 5/7 outpatient cardiologist resumed low-dose Lasix due to concerns of possible pulmonary edema on clinical exam and after review of chest x-ray on 5/5 prior to discharge.  ED Course:  Vital Signs: BP 121/90   Pulse 100   Temp 97.9 F (36.6 C) (Rectal)   Resp 15   SpO2 100%  CTA chest, chest x-ray: As above Lab data: Sodium 154, potassium 0.9, chloride 123, CO2 21, glucose 120, BUN 61, creatinine 0.85, anion gap 10, LFTs not elevated, albumin 2.6, troponin 0.03, thick acid as above, white count 10,400 differential, hemoglobin 9.6, platelets 465,000 d-dimer 5.19, urinalysis with hazy appearance otherwise unremarkable.  Urine culture ordered but not yet obtained  Medications and treatments: Tylenol 1 g x 1, NS bolus x1 L  Review of Systems:  **Unable to obtain from patient since she is nonverbal.  Son at bedside and reports that policy of nursing facility is to only on lower RN to perform oral suction.  He is concerned that patient is repeatedly aspirating oral secretions because they are unable to suction patient.   Past Medical History:  Diagnosis Date  . Abnormal liver function test   . Cerebral infarct ([place])   . Cognitive communication deficit   . Difficulty walking   . Dysphagia   . Femoral neck fracture ([place]) 03/2017   left   . Gastrostomy status ([place])   . GERD (gastroesophageal reflux disease)   . Hemiparesis ([place])   . Hemiplegia (  [place])   . Hyperlipidemia   . Hypertensive emergency 03/2017  . Hypertensive heart disease   . Hypokalemia 03/2017  . Muscle weakness   . Other hyperlipidemia   . Pain in left hip   . Presence of artificial hip, left   . Respiratory symptoms   . Severe protein-calorie malnutrition ([place])   . Stroke [facility])     Past Surgical History:  Procedure Laterality Date  . ANTERIOR APPROACH HEMI HIP  ARTHROPLASTY Left 03/25/2017   Procedure: ANTERIOR APPROACH HEMI HIP ARTHROPLASTY;  Surgeon: Rod Can, MD;  Location: [place];  Service: Orthopedics;  Laterality: Left;  . IR GASTROSTOMY TUBE MOD SED  03/31/2017    Social History   Socioeconomic History  . Marital status: Widowed    Spouse name: Not on file  . Number of children: Not on file  . Years of education: Not on file  . Highest education level: Not on file  Occupational History  . Not on file  Social Needs  . Financial resource strain: Not on file  . Food insecurity:    Worry: Not on file    Inability: Not on file  . Transportation needs:    Medical: Not on file    Non-medical: Not on file  Tobacco Use  . Smoking status: Never Smoker  . Smokeless tobacco: Never Used  Substance and Sexual Activity  . Alcohol use: No    Frequency: Never  . Drug use: No  . Sexual activity: Not on file  Lifestyle  . Physical activity:    Days per week: Not on file    Minutes per session: Not on file  . Stress: Not on file  Relationships  . Social connections:    Talks on phone: Not on file    Gets together: Not on file    Attends religious service: Not on file    Active member of club or organization: Not on file    Attends meetings of clubs or organizations: Not on file    Relationship status: Not on file  . Intimate partner violence:    Fear of current or ex partner: Not on file    Emotionally abused: Not on file    Physically abused: Not on file    Forced sexual activity: Not on file  Other Topics Concern  . Not on file  Social History Narrative   Widowed   + children   No EtOH, tobacco    Mobility: Bedbound Work history: Not obtained   No Known Allergies  Family History  Problem Relation Age of Onset  . CAD Mother   . Heart disease Mother       Prior to Admission medications   Medication Sig Start Date End Date Taking? Authorizing Provider  acetaminophen (TYLENOL) 325 MG tablet Place 650 mg into  feeding tube every 6 (six) hours as needed for mild pain or moderate pain. Notify MD if not relieved. Special Requirement - Pre-Pain (Physician Order) Not to exceed 3000mg  in 24 hour period    Yes [provider]  Amino Acids-Protein Hydrolys (FEEDING SUPPLEMENT, PRO-STAT SUGAR FREE 64,) LIQD Place 30 mLs into feeding tube 2 (two) times daily.    Yes [provider]  amLODipine (NORVASC) 5 MG tablet Place 5 mg into feeding tube daily.   Yes [provider]  atorvastatin (LIPITOR) 40 MG tablet Place 1 tablet (40 mg total) into feeding tube daily at 6 PM. 04/02/17  Yes Aline August, MD  bisacodyl (DULCOLAX) 10  MG suppository Place 1 suppository (10 mg total) rectally daily as needed for moderate constipation. 06/04/17  Yes Kayleen Memos, DO  carboxymethylcellulose (REFRESH PLUS) 0.5 % SOLN Place 1 drop into both eyes every 8 (eight) hours as needed (dry eyes).   Yes [provider]  carvedilol (COREG) 6.25 MG tablet Place 6.25 mg into feeding tube 2 (two) times daily with a meal. HYPERTENSIVE HEART DISEASE WITH HEART FAILURE   Yes [provider]  cetirizine (ZYRTEC) 10 MG tablet Place 10 mg into feeding tube daily.   Yes [provider]  cholestyramine (QUESTRAN) 4 g packet Place 4 g into feeding tube 2 (two) times daily. Mixed with 4 ounces of water   Yes [provider]  clopidogrel (PLAVIX) 75 MG tablet Place 1 tablet (75 mg total) into feeding tube daily. 04/26/17  Yes Bonnell Public, MD  [facility]) [place] by Does not apply route. Boots to bilateral feet QD- while in bed-may remove for AM/PM care 7 am on/off 3 pm on/off 11 pm on/off   Yes [provider]  magnesium hydroxide (MILK OF MAGNESIA) 400 MG/5ML suspension Place 30 mLs into feeding tube daily as needed for mild constipation.   Yes [provider]  nitroGLYCERIN (NITROSTAT) 0.4 MG SL tablet Place 1 tablet (0.4 mg total) under the tongue  every 5 (five) minutes as needed for chest pain. 04/02/17  Yes Aline August, MD  Nutritional Supplements (FEEDING SUPPLEMENT, OSMOLITE 1.5 CAL,) LIQD Place 1,000 mLs into feeding tube 4 (four) times daily. 1425 Kcal and 66g Pro daily ( May cont Osmolite 1.2 until Osmolite 1.5 available)   Yes [provider]  Pollen Extracts (PROSTAT PO) 30 mLs by PEG Tube route 2 (two) times daily.   Yes [provider]  ranitidine (ZANTAC) 150 MG/10ML syrup Place 10 mLs (150 mg total) into feeding tube daily. 06/05/17  Yes Hall, Carole N, DO  saccharomyces boulardii (FLORASTOR) 250 MG capsule 250 mg. GIVE 1 CAPSULE VIA PEG TUBE DAILY AS SUPPLEMENT    Yes [provider]  traMADol (ULTRAM) 50 MG tablet Take 50 mg by mouth 3 (three) times daily.   Yes [provider]    Physical Exam: Vitals:   07/09/17 1500 07/09/17 1600 07/09/17 1630 07/09/17 1645  BP: (!) 145/94 (!) 107/95 (!) 118/98 121/90  Pulse: (!) 105  97 100  Resp: (!) 24 (!) 21 15   Temp:      TempSrc:      SpO2: 100%  99% 100%      Constitutional: NAD, calm, uncomfortable 2/2 different right knee pain which is chronic noting patient has not received her usual medications yet today; and otherwise appears chronically ill and cachectic Eyes: PERRL, lids and conjunctivae normal ENMT: Mucous membranes are dry. Posterior pharynx clear of any exudate or lesions.poor dentition.  Neck: normal, supple, no masses, no thyromegaly Respiratory: clear to auscultation bilaterally, no wheezing, no crackles. Normal respiratory effort. No accessory muscle use.  Cardiovascular: Regular rate and rhythm, no murmurs / rubs / gallops. No extremity edema. 2+ pedal pulses. No carotid bruits.  Abdomen: no tenderness, no masses palpated. No hepatosplenomegaly. Bowel sounds positive.  PEG insertion site with fibrin debris but no erythema or induration. Musculoskeletal: no clubbing / cyanosis. No joint deformity upper and lower extremities.  Good ROM, partial flexion contracture right knee without any redness or effusion. Normal muscle tone.  Skin: no rashes, lesions, No induration-sacral decubitus estimated 2 x 3 cm with  pink granular base without drainage or induration. Neurologic: CN 2-12 grossly intact except for documented issues with expressive aphasia and dysphagia. Sensation intact, DTR not tested. Strength 4/5 LUE.  Strength LLE not tested.  Right hemiplegia. Psychiatric: Alert and appears to be oriented x 3 but being nonverbal. Normal mood.  Indicates via letter board.   Labs on Admission: I have personally reviewed following labs and imaging studies  CBC: Recent Labs  Lab 07/09/17 1003  WBC 10.4  NEUTROABS 7.8*  HGB 9.6*  HCT 32.3*  MCV 90.0  PLT 409*   Basic Metabolic Panel: Recent Labs  Lab 07/09/17 1003  NA 154*  K 3.9  CL 123*  CO2 21*  GLUCOSE 120*  BUN 61*  CREATININE 0.85  CALCIUM 9.0   GFR: Estimated Creatinine Clearance: 38 mL/min (by C-G formula based on SCr of 0.85 mg/dL). Liver Function Tests: Recent Labs  Lab 07/09/17 1003  AST 25  ALT 28  ALKPHOS 70  BILITOT 0.3  PROT 7.1  ALBUMIN 2.6*   No results for input(s): LIPASE, AMYLASE in the last 168 hours. No results for input(s): AMMONIA in the last 168 hours. Coagulation Profile: No results for input(s): INR, PROTIME in the last 168 hours. Cardiac Enzymes: Recent Labs  Lab 07/09/17 1003  TROPONINI 0.03*   BNP (last 3 results) No results for input(s): PROBNP in the last 8760 hours. HbA1C: No results for input(s): HGBA1C in the last 72 hours. CBG: No results for input(s): GLUCAP in the last 168 hours. Lipid Profile: No results for input(s): CHOL, HDL, LDLCALC, TRIG, CHOLHDL, LDLDIRECT in the last 72 hours. Thyroid Function Tests: No results for input(s): TSH, T4TOTAL, FREET4, T3FREE, THYROIDAB in the last 72 hours. Anemia Panel: No results for input(s): VITAMINB12, FOLATE, FERRITIN, TIBC, IRON, RETICCTPCT in the last 72  hours. Urine analysis:    Component Value Date/Time   COLORURINE YELLOW 07/09/2017 1545   APPEARANCEUR HAZY (A) 07/09/2017 1545   LABSPEC 1.016 07/09/2017 1545   PHURINE 5.0 07/09/2017 1545   GLUCOSEU NEGATIVE 07/09/2017 1545   HGBUR NEGATIVE 07/09/2017 1545   BILIRUBINUR NEGATIVE 07/09/2017 1545   KETONESUR NEGATIVE 07/09/2017 1545   PROTEINUR NEGATIVE 07/09/2017 1545   NITRITE NEGATIVE 07/09/2017 1545   LEUKOCYTESUR NEGATIVE 07/09/2017 1545   Sepsis Labs: @LABRCNTIP (procalcitonin:4,lacticidven:4) )No results found for this or any previous visit (from the past 240 hour(s)).   Radiological Exams on Admission: Dg Chest 2 View  Result Date: 07/09/2017 CLINICAL DATA:  Shortness breath and hematemesis beginning this morning. EXAM: CHEST - 2 VIEW COMPARISON:  06/05/2017 FINDINGS: Stable borderline cardiomegaly. Stable ectasia of thoracic aorta. Both lungs are clear. No evidence of pleural effusion. IMPRESSION: Stable exam.  No active cardiopulmonary disease. Electronically Signed   By: Earle Gell M.D.   On: 07/09/2017 11:57   Ct Angio Chest Pe W And/or Wo Contrast  Result Date: 07/09/2017 CLINICAL DATA:  Shortness of breath hemoptysis EXAM: CT ANGIOGRAPHY CHEST WITH CONTRAST TECHNIQUE: Multidetector CT imaging of the chest was performed using the standard protocol during bolus administration of intravenous contrast. Multiplanar CT image reconstructions and MIPs were obtained to evaluate the vascular anatomy. CONTRAST:  55mL ISOVUE-370 IOPAMIDOL (ISOVUE-370) INJECTION 76% COMPARISON:  Chest radiograph July 09, 2017 FINDINGS: Cardiovascular: There is no demonstrable pulmonary embolus. There is no thoracic aortic aneurysm, although the aorta is somewhat ectatic. No dissection seen. It should be noted that the contrast bolus is not optimal for assessment for potential dissection. Visualized great vessels appear normal except for minimal calcification  in the proximal left subclavian artery. There is  no appreciable pericardial effusion or pericardial thickening. There is left ventricular hypertrophy. Mediastinum/Nodes: Thyroid appears unremarkable. There is no appreciable thoracic adenopathy. There are foci of air throughout much of the esophagus. There is a small hiatal hernia. Lungs/Pleura: There is no lung edema or consolidation. No pleural effusion or pleural thickening evident. There is slight lower lobe bronchiectatic change bilaterally. Upper Abdomen: Limited visualization in the upper abdomen appears grossly unremarkable. Musculoskeletal: No blastic or lytic bone lesions are evident. No chest wall lesions evident. Review of the MIP images confirms the above findings. IMPRESSION: 1. No demonstrable pulmonary embolus. No thoracic aortic aneurysm. Aorta is somewhat ectatic. No dissection seen. It must be cautioned that the contrast bolus is not optimal in the aorta for assessment for potential dissection. 2.  There is left ventricular hypertrophy. 3. No edema or consolidation. Slight lower lobe bronchiectasis noted. 4.  No appreciable thoracic adenopathy. 5.  Small hiatal hernia.  Air noted throughout esophagus. Electronically Signed   By: Lowella Grip III M.D.   On: 07/09/2017 14:30    EKG: (Independently reviewed) sinus tachycardia with ventricular rate 102 bpm, QTC 456, early R wave progression, nonspecific ST changes no acute ischemia  Assessment/Plan Principal Problem:   Acute hypernatremia/Azotemia 2/2 dehydration -Patient presents with shortness of breath and hematemesis/?  Coffee-ground reflux and was found to have significant hypernatremia -Suspect etiology secondary to reinitiation of diuretics due to presumed heart failure exacerbation -STOP LASIX and recommend to not resume after discharge -Free water 200 cc per tube every 6 hours -1/2 NS at 50 cc/HR -Follow labs -Avoid rapid overcorrection given underlying hypoalbuminemia  Active Problems:   Dysphagia as late effect of  stroke/PEG Tube/severe protein calorie malnutrition -Patient with recurrent aspiration of oral secretions -Son reports difficulty with ability of the SNF to frequently perform oral suction and based on facility policy family are not allowed to do this -Patient able self suction but apparently suction machine is not continuously running so someone has to turn the and on for the patient accomplish -Family to discuss with facility to determine if policy can be reviewed -Begin with frequent oral suctioning every 2 hours here and decrease frequency as tolerated -Continue Osmolite 1.5,  237 cc every 6 hours -Free water as above -Albumin 2.6 -Continue pro-stat supplementation -Reported with hematemesis and/or coffee-ground emesis to arrival-suspect this is reflux of gastric contents in a patient with known recurrent aspiration-can you to monitor    History of recurrent UTIs -Recent funguria apparent history of Pseudomonas -Urinalysis appears unremarkable-follow up on culture    Recurrent Clostridium difficile diarrhea -Completed oral vancomycin on 5/15 and no further diarrhea -Continue contact isolation -If diarrhea recurs consider repeating C. difficile PCR -Continue cholestyramine    Hypertensive heart disease/LV Diastolic dysfunction  -Patient evaluated by cardiology on 5/7 with upper airway congestion and crackles on exam concerning for possible heart failure exacerbation so low-dose Lasix resumed -In review of chest x-ray from 5/5 there is a clear linear inflammatory pattern on the right lung with associated interstitial edema consistent with recurrent aspiration-supports history of family who reports difficulty with obtaining frequent oral suctioning to prevent aspiration of oral secretions -Unfortunately recurrent aspiration pneumonitis can mimic heart failure and given this is a patient with severe protein calorie malnutrition and limited fluid intake unless she develops peripheral edema  and/or significant weight gain would not continue to use Lasix -Current SBP suboptimal so holding carvedilol and Norvasc for now-suspect once patient  returns to euvolemic state blood pressure will improve    Positive D dimer -CTA chest without PE    Troponin level elevated -Likely mild elevation secondary to severe dehydration -No chest pain -Indication to continue telemetry or continue to cycle    Cognitive communication deficit secondary to history of stroke -Patient communicates via letter board    Sacral decubitus ulcer, stage II -Appears clean and according to family is decreasing in size [employer] forces logistics/support/administrative officer -WOC RN consultation to determine appropriate wound care    **Additional lab, imaging and/or diagnostic evaluation at discretion of supervising physician  DVT prophylaxis: Lovenox Code Status: Full (confirmed with son at bedside) Family Communication: Son Disposition Plan: SNF Consults called: None    Romana Deaton L. ANP-BC [facility] Pager [phone]   If 7PM-7AM, please contact night-coverage [email or web address] Password [email or web address]  07/09/2017, 5:06 PM

## 2017-07-09 NOTE — ED Notes (Signed)
Patient transported to CT 

## 2017-07-09 NOTE — ED Triage Notes (Signed)
Pt to ER from [facility] in [place], staff activated EMS for shortness of breath and hematemesis onset this morning. Pt arrives nonverbal, per staff this is baseline from previous CVA with residual right sided deficits. Pt is alert and oriented x4 however, communicates utilizing letter board. Patient reports leg pain x "months." VSS at this time. Pt in NAD on arrival. EMS reports pressure wound to sacral area.

## 2017-07-10 DIAGNOSIS — E870 Hyperosmolality and hypernatremia: Principal | ICD-10-CM

## 2017-07-10 LAB — CBC
HCT: 33.9 % — ABNORMAL LOW (ref 36.0–46.0)
Hemoglobin: 10 g/dL — ABNORMAL LOW (ref 12.0–15.0)
MCH: 27.2 pg (ref 26.0–34.0)
MCHC: 29.5 g/dL — ABNORMAL LOW (ref 30.0–36.0)
MCV: 92.4 fL (ref 78.0–100.0)
Platelets: 406 10*3/uL — ABNORMAL HIGH (ref 150–400)
RBC: 3.67 MIL/uL — ABNORMAL LOW (ref 3.87–5.11)
RDW: 16.1 % — ABNORMAL HIGH (ref 11.5–15.5)
WBC: 7.6 10*3/uL (ref 4.0–10.5)

## 2017-07-10 LAB — BASIC METABOLIC PANEL
Anion gap: 10 (ref 5–15)
BUN: 45 mg/dL — AB (ref 6–20)
CHLORIDE: 119 mmol/L — AB (ref 101–111)
CO2: 21 mmol/L — AB (ref 22–32)
Calcium: 8.9 mg/dL (ref 8.9–10.3)
Creatinine, Ser: 0.78 mg/dL (ref 0.44–1.00)
GFR calc non Af Amer: >60 mL/min (ref >60)
Glucose, Bld: 137 mg/dL — ABNORMAL HIGH (ref 65–99)
Potassium: 3.4 mmol/L — ABNORMAL LOW (ref 3.5–5.1)
SODIUM: 150 mmol/L — AB (ref 135–145)

## 2017-07-10 LAB — URINE CULTURE

## 2017-07-10 MED ORDER — CARVEDILOL 6.25 MG PO TABS
6.2500 mg | ORAL_TABLET | Freq: Two times a day (BID) | ORAL | Status: DC
  Administered 2017-07-10 – 2017-07-11 (×3): 6.25 mg
  Filled 2017-07-10 (×3): qty 1

## 2017-07-10 MED ORDER — CLOPIDOGREL BISULFATE 75 MG PO TABS
75.0000 mg | ORAL_TABLET | Freq: Every day | ORAL | Status: DC
  Administered 2017-07-10 – 2017-07-11 (×2): 75 mg
  Filled 2017-07-10 (×2): qty 1

## 2017-07-10 MED ORDER — ATORVASTATIN CALCIUM 40 MG PO TABS
40.0000 mg | ORAL_TABLET | Freq: Every day | ORAL | Status: DC
  Administered 2017-07-10 – 2017-07-11 (×2): 40 mg
  Filled 2017-07-10 (×2): qty 1

## 2017-07-10 MED ORDER — POTASSIUM CHLORIDE 20 MEQ PO PACK
40.0000 meq | PACK | Freq: Once | ORAL | Status: AC
  Administered 2017-07-10: 40 meq via ORAL
  Filled 2017-07-10: qty 2

## 2017-07-10 NOTE — Consult Note (Signed)
[place] Nurse wound consult note Reason for Consult:Stage 3 pressure injury (healing) to coccygeal area Wound type:Pressure Pressure Injury POA: Yes Measurement:0.6cm x 1cm x 0.3with with surrounding skin showing evidence of contraction and granulation (scar tissue is present, just without much return of pigment at this time) Wound bed: clean, pink, moist Drainage (amount, consistency, odor) scant serous drainage Periwound:As noted above Dressing procedure/placement/frequency:Continue turning and repositioning, silicone foam to wound. Pressure redistribution chair pad for OOB use. [place] nursing team will not follow, but will remain available to this patient, the nursing and medical teams.  Please re-consult if needed. Thanks, Maudie Flakes, MSN, RN, [place], Arther Abbott  Pager# [phone]

## 2017-07-10 NOTE — Progress Notes (Signed)
PROGRESS NOTE    Alyssa Savage  ELF:[id number] DOB: 1930-08-06 DOA: 07/09/2017 PCP: Hennie Duos, MD    Brief Narrative: Alyssa Savage is a 84 y.o. female with medical history significant for history of CVA with resultant right side hemiplegia and severe dysphagia requiring a PEG tube.  Also history of hypertension with LV diastolic dysfunction, secondary expressive aphasia in context of prior stroke, stage II sacral decubitus, severe protein calorie malnutrition.  Also a history of recurrent UTIs and C. difficile colitis.  She was recently discharged on 5/5 after an admission for C. difficile colitis.  Patient was treated with oral vancomycin and based on discharge summary completed treatment on 5/15. According to family has not had any further issues with ongoing diarrhea.  She did have Hemoccult positive stools in the context of acute colitis during the previous admission.  Urine culture from 4/27 was positive for yeast and she was treated accordingly.  This is recurrent noting similar microbiology March 2019 urine culture.  Also documented she has a history of Pseudomonas UTI as well.  Patient was sent to the ER today the nursing facility reports of shortness of breath and coffee-ground emesis.  ER she was afebrile, mildly tachycardic with somewhat soft blood pressure readings. tni was mildly elevated at 0.03 and given her respiratory symptoms and tachycardia and question of hemoptysis d-dimer was obtained which was elevated at 5.19.  CTA the chest did not reveal PE and also did not reveal any edema or consolidation.  Lactic acid was borderline elevated at 1.98 but repeat was 1.62.  Patient did not have any leukocytosis.  Her sodium was noted to be elevated at 154 with a BUN of 61 noting at time of discharge sodium was 143 and BUN was 15.  In review of outpatient documentation on 5/7 outpatient cardiologist resumed low-dose Lasix due to concerns of possible pulmonary edema on clinical exam and  after review of chest x-ray on 5/5 prior to discharge.     Assessment & Plan:   Principal Problem:   Acute hypernatremia Active Problems:   Dysphagia as late effect of stroke   Cognitive communication deficit   History of recurrent UTIs   Recurrent Clostridium difficile diarrhea   Severe protein-calorie malnutrition (HCC)   Hypertensive heart disease/LV Diastolic dysfunction   Positive D dimer   Troponin level elevated   Sacral decubitus ulcer, stage II  1-Hypernatremia;  Related to dehydration, lasix.  Hold lasix.  Continue with IV fluids, d 5.   2-Hemoptysis; coffee ground emesis.  IV pepcid.  HB stable  No further episodes.   3-Dysphagia as late effect of stroke/PEG Tube/severe protein calorie malnutrition Continue with Osmolite 1.5.  Free water.   4-History of recurrent UTIs Follow urine culture.   Positive D dimer -CTA chest without PE  Sacral decubitus ulcer, stage III; local care. Transport planner.   Hypokalemia; kcl time one.   History stroke.  Resume plavix.    DVT prophylaxis: SCD Code Status: full code.  Family Communication: no family at bedside.  Disposition Plan:  Back to SNF when hypernatremia improved.  Consultants:   none  Procedures:  none Antimicrobials: none    Subjective: Lying in bed, non verbal , answer questions by sing.    Objective: Vitals:   07/09/17 1645 07/09/17 1800 07/09/17 2008 07/10/17 0456  BP: 121/90 (!) 144/88 (!) 148/99 125/78  Pulse: 100 98 96 93  Resp:  18 16 17   Temp:  (!) 97.1 F (36.2 C)  97.8 F (  36.6 C)  TempSrc:  Axillary  Oral  SpO2: 100% 100% 100% 100%    Intake/Output Summary (Last 24 hours) at 07/10/2017 1119 Last data filed at 07/09/2017 1800 Gross per 24 hour  Intake 0 ml  Output -  Net 0 ml   There were no vitals filed for this visit.  Examination:  General exam: Appears calm and comfortable  Respiratory system: Clear to auscultation. Respiratory effort normal. Cardiovascular  system: S1 & S2 heard, RRR. No JVD, murmurs, rubs, gallops or clicks. No pedal edema. Gastrointestinal system: Abdomen is nondistended, soft and nontender. No organomegaly or masses felt. Normal bowel sounds heard. Central nervous system: alert. Aphasic  Extremities: no edma Skin: decubitus wound.     Data Reviewed: I have personally reviewed following labs and imaging studies  CBC: Recent Labs  Lab 07/09/17 1003 07/10/17 0631  WBC 10.4 7.6  NEUTROABS 7.8*  --   HGB 9.6* 10.0*  HCT 32.3* 33.9*  MCV 90.0 92.4  PLT 465* 409*   Basic Metabolic Panel: Recent Labs  Lab 07/09/17 1003 07/10/17 0631  NA 154* 150*  K 3.9 3.4*  CL 123* 119*  CO2 21* 21*  GLUCOSE 120* 137*  BUN 61* 45*  CREATININE 0.85 0.78  CALCIUM 9.0 8.9   GFR: Estimated Creatinine Clearance: 40.3 mL/min (by C-G formula based on SCr of 0.78 mg/dL). Liver Function Tests: Recent Labs  Lab 07/09/17 1003  AST 25  ALT 28  ALKPHOS 70  BILITOT 0.3  PROT 7.1  ALBUMIN 2.6*   No results for input(s): LIPASE, AMYLASE in the last 168 hours. No results for input(s): AMMONIA in the last 168 hours. Coagulation Profile: No results for input(s): INR, PROTIME in the last 168 hours. Cardiac Enzymes: Recent Labs  Lab 07/09/17 1003  TROPONINI 0.03*   BNP (last 3 results) No results for input(s): PROBNP in the last 8760 hours. HbA1C: No results for input(s): HGBA1C in the last 72 hours. CBG: No results for input(s): GLUCAP in the last 168 hours. Lipid Profile: No results for input(s): CHOL, HDL, LDLCALC, TRIG, CHOLHDL, LDLDIRECT in the last 72 hours. Thyroid Function Tests: No results for input(s): TSH, T4TOTAL, FREET4, T3FREE, THYROIDAB in the last 72 hours. Anemia Panel: No results for input(s): VITAMINB12, FOLATE, FERRITIN, TIBC, IRON, RETICCTPCT in the last 72 hours. Sepsis Labs: Recent Labs  Lab 07/09/17 1123 07/09/17 1309  LATICACIDVEN 1.98* 1.62    Recent Results (from the past 240 hour(s))    MRSA PCR Screening     Status: None   Collection Time: 07/09/17  6:56 PM  Result Value Ref Range Status   MRSA by PCR NEGATIVE NEGATIVE Final    Comment:        The GeneXpert MRSA Assay (FDA approved for NASAL specimens only), is one component of a comprehensive MRSA colonization surveillance program. It is not intended to diagnose MRSA infection nor to guide or monitor treatment for MRSA infections. Performed at [facility], [place] [address] [place], [place] [address]          Radiology Studies: Dg Chest 2 View  Result Date: 07/09/2017 CLINICAL DATA:  Shortness breath and hematemesis beginning this morning. EXAM: CHEST - 2 VIEW COMPARISON:  06/05/2017 FINDINGS: Stable borderline cardiomegaly. Stable ectasia of thoracic aorta. Both lungs are clear. No evidence of pleural effusion. IMPRESSION: Stable exam.  No active cardiopulmonary disease. Electronically Signed   By: Earle Gell M.D.   On: 07/09/2017 11:57   Ct Angio Chest Pe W  And/or Wo Contrast  Result Date: 07/09/2017 CLINICAL DATA:  Shortness of breath hemoptysis EXAM: CT ANGIOGRAPHY CHEST WITH CONTRAST TECHNIQUE: Multidetector CT imaging of the chest was performed using the standard protocol during bolus administration of intravenous contrast. Multiplanar CT image reconstructions and MIPs were obtained to evaluate the vascular anatomy. CONTRAST:  48mL ISOVUE-370 IOPAMIDOL (ISOVUE-370) INJECTION 76% COMPARISON:  Chest radiograph July 09, 2017 FINDINGS: Cardiovascular: There is no demonstrable pulmonary embolus. There is no thoracic aortic aneurysm, although the aorta is somewhat ectatic. No dissection seen. It should be noted that the contrast bolus is not optimal for assessment for potential dissection. Visualized great vessels appear normal except for minimal calcification in the proximal left subclavian artery. There is no appreciable pericardial effusion or pericardial thickening. There is left ventricular hypertrophy.  Mediastinum/Nodes: Thyroid appears unremarkable. There is no appreciable thoracic adenopathy. There are foci of air throughout much of the esophagus. There is a small hiatal hernia. Lungs/Pleura: There is no lung edema or consolidation. No pleural effusion or pleural thickening evident. There is slight lower lobe bronchiectatic change bilaterally. Upper Abdomen: Limited visualization in the upper abdomen appears grossly unremarkable. Musculoskeletal: No blastic or lytic bone lesions are evident. No chest wall lesions evident. Review of the MIP images confirms the above findings. IMPRESSION: 1. No demonstrable pulmonary embolus. No thoracic aortic aneurysm. Aorta is somewhat ectatic. No dissection seen. It must be cautioned that the contrast bolus is not optimal in the aorta for assessment for potential dissection. 2.  There is left ventricular hypertrophy. 3. No edema or consolidation. Slight lower lobe bronchiectasis noted. 4.  No appreciable thoracic adenopathy. 5.  Small hiatal hernia.  Air noted throughout esophagus. Electronically Signed   By: Lowella Grip III M.D.   On: 07/09/2017 14:30        Scheduled Meds: . acetaminophen  1,000 mg Oral Once  . chlorhexidine  15 mL Mouth Rinse BID  . cholestyramine  4 g Per Tube BID  . feeding supplement (OSMOLITE 1.5 CAL)  237 mL Per Tube QID  . feeding supplement (PRO-STAT SUGAR FREE 64)  30 mL Per Tube BID  . free water  200 mL Per Tube Q6H  . mouth rinse  15 mL Mouth Rinse q12n4p  . saccharomyces boulardii  250 mg Per Tube BID  . sodium chloride flush  3 mL Intravenous Q12H  . traMADol  50 mg Oral Q8H   Continuous Infusions: . dextrose 75 mL/hr at 07/10/17 0930  . famotidine (PEPCID) IV 20 mg (07/10/17 0930)     LOS: 1 day    Time spent: 35 minutes.     Elmarie Shiley, MD [facility] Pager [phone]  If 7PM-7AM, please contact night-coverage [email or web address] Password [email or web address] 07/10/2017, 11:19 AM

## 2017-07-11 LAB — BASIC METABOLIC PANEL
Anion gap: 5 (ref 5–15)
BUN: 31 mg/dL — AB (ref 6–20)
CO2: 22 mmol/L (ref 22–32)
Calcium: 8.8 mg/dL — ABNORMAL LOW (ref 8.9–10.3)
Chloride: 115 mmol/L — ABNORMAL HIGH (ref 101–111)
Creatinine, Ser: 0.68 mg/dL (ref 0.44–1.00)
Glucose, Bld: 178 mg/dL — ABNORMAL HIGH (ref 65–99)
POTASSIUM: 3.7 mmol/L (ref 3.5–5.1)
SODIUM: 142 mmol/L (ref 135–145)

## 2017-07-11 LAB — CBC
HCT: 32.9 % — ABNORMAL LOW (ref 36.0–46.0)
Hemoglobin: 9.8 g/dL — ABNORMAL LOW (ref 12.0–15.0)
MCH: 26.8 pg (ref 26.0–34.0)
MCHC: 29.8 g/dL — ABNORMAL LOW (ref 30.0–36.0)
MCV: 90.1 fL (ref 78.0–100.0)
PLATELETS: 407 10*3/uL — AB (ref 150–400)
RBC: 3.65 MIL/uL — AB (ref 3.87–5.11)
RDW: 15.8 % — ABNORMAL HIGH (ref 11.5–15.5)
WBC: 9.6 10*3/uL (ref 4.0–10.5)

## 2017-07-11 MED ORDER — FAMOTIDINE 20 MG PO TABS: 20.0000 mg | ORAL_TABLET | Freq: Every day | ORAL | 1 refills | Status: DC

## 2017-07-11 MED ORDER — FREE WATER: 200.0000 mL | Freq: Four times a day (QID) | 0 refills | Status: AC

## 2017-07-11 NOTE — Progress Notes (Signed)
Pt is discharged to [facility] via [place].  Report given to [facility].

## 2017-07-11 NOTE — [place] FL2 (Signed)
[place] MEDICAID FL2 LEVEL OF CARE SCREENING TOOL     IDENTIFICATION  Patient Name: Alyssa Savage Birthdate: February 12, 1930 Sex: female Admission Date (Current Location): 07/09/2017  [facility] and [place] Number:  Herbalist and Address:  [facility], [place] [address] [place], [place] [address]      Provider Number: [id number]  Attending Physician Name and Address:  Elmarie Shiley, MD  Relative Name and Phone Number:       Current Level of Care: Hospital Recommended Level of Care: [place] Prior Approval Number:    Date Approved/Denied:   PASRR Number: [id number]  Discharge Plan: SNF    Current Diagnoses: Patient Active Problem List   Diagnosis Date Noted  . Acute hypernatremia 07/09/2017  . Dysphagia as late effect of stroke 07/09/2017  . Cognitive communication deficit 07/09/2017  . History of recurrent UTIs 07/09/2017  . Recurrent Clostridium difficile diarrhea 07/09/2017  . Severe protein-calorie malnutrition ([place]) 07/09/2017  . Hypertensive heart disease/LV Diastolic dysfunction 36/14/4315  . Positive D dimer 07/09/2017  . Troponin level elevated 07/09/2017  . Sacral decubitus ulcer, stage II 07/09/2017  . History of cerebrovascular accident (CVA) with residual deficit 06/26/2017  . Hemiparesis ([place]) 06/26/2017  . Abnormal liver function test 06/26/2017  . Normocytic anemia 06/26/2017  . Aphasia 06/26/2017  . Hyperlipidemia 06/26/2017  . Sacral decubitus ulcer, stage III ([place]) 06/26/2017  . Chronic diarrhea 06/26/2017  . Allergic rhinitis 06/26/2017  . Clostridium difficile diarrhea   . Uterine leiomyoma   . Rectal bleeding   . Nausea and vomiting 05/28/2017  . Constipation   . Acute CVA (cerebrovascular accident) ([place]) 04/22/2017  . CKD (chronic kidney disease), stage III ([place]) 04/20/2017  . Acute encephalopathy 04/19/2017  . UTI (urinary tract infection) 04/19/2017  . Dysphagia 04/19/2017  .  Displaced fracture of left femoral neck ([place]) 03/25/2017  . Protein-calorie malnutrition, severe 03/24/2017  . Fall 03/21/2017  . Fracture of femoral neck, left, closed ([place]) 03/21/2017  . AKI (acute kidney injury) ([place]) 03/21/2017  . Hypertensive emergency 03/21/2017  . Elevated troponin 03/21/2017  . Hypokalemia 03/21/2017  . Stroke ([place]) 03/21/2017  . GERD (gastroesophageal reflux disease)   . Closed fracture of left hip ([place])   . Prolonged Q-T interval on ECG     Orientation RESPIRATION BLADDER Height & Weight     Self, Situation, Time, Place  Normal Incontinent, External catheter Weight: 107 lb (48.5 kg)(06/29/17 encounter) Height:  5\' 3"  (160 cm)  BEHAVIORAL SYMPTOMS/MOOD NEUROLOGICAL BOWEL NUTRITION STATUS    (None) Continent Feeding tube(PEG)  AMBULATORY STATUS COMMUNICATION OF NEEDS Skin     Verbally PU Stage and Appropriate Care     PU Stage 3 Dressing: (Right sacrum: Foam.)                 Personal Care Assistance Level of Assistance              Functional Limitations Info  Sight, Hearing Sight Info: Adequate Hearing Info: Adequate      SPECIAL CARE FACTORS FREQUENCY  PT (By licensed PT), OT (By licensed OT)     PT Frequency: 5 x week OT Frequency: 5 x week            Contractures      Additional Factors Info  Code Status, Allergies, Isolation Precautions Code Status Info: Full Allergies Info: NKDA     Isolation Precautions Info: Enteric     Current Medications (07/11/2017):  This is  the current hospital active medication list Current Facility-Administered Medications  Medication Dose Route Frequency Provider Last Rate Last Dose  . acetaminophen (TYLENOL) tablet 650 mg  650 mg Per Tube Q6H PRN Samella Parr, NP       Or  . acetaminophen (TYLENOL) suppository 650 mg  650 mg Rectal Q6H PRN Samella Parr, NP      . acetaminophen (TYLENOL) tablet 1,000 mg  1,000 mg Oral Once Erin Hearing L, NP      . atorvastatin (LIPITOR) tablet  40 mg  40 mg Per Tube q1800 Regalado, Belkys A, MD   40 mg at 07/10/17 1821  . carvedilol (COREG) tablet 6.25 mg  6.25 mg Per Tube BID WC Regalado, Belkys A, MD   6.25 mg at 07/11/17 0825  . chlorhexidine (PERIDEX) 0.12 % solution 15 mL  15 mL Mouth Rinse BID Regalado, Belkys A, MD   15 mL at 07/11/17 1205  . cholestyramine (QUESTRAN) packet 4 g  4 g Per Tube BID Samella Parr, NP   4 g at 07/11/17 1204  . clopidogrel (PLAVIX) tablet 75 mg  75 mg Per Tube Daily Regalado, Belkys A, MD   75 mg at 07/11/17 1204  . dextrose 5 % solution   Intravenous Continuous Regalado, Belkys A, MD 75 mL/hr at 07/11/17 1202    . famotidine (PEPCID) IVPB 20 mg premix  20 mg Intravenous Q12H Regalado, Belkys A, MD   Stopped at 07/11/17 1236  . feeding supplement (OSMOLITE 1.5 CAL) liquid 237 mL  237 mL Per Tube QID Samella Parr, NP 0 mL/hr at 07/10/17 1412 237 mL at 07/11/17 1453  . feeding supplement (PRO-STAT SUGAR FREE 64) liquid 30 mL  30 mL Per Tube BID Samella Parr, NP   30 mL at 07/11/17 1209  . free water 200 mL  200 mL Per Tube Q6H Samella Parr, NP   200 mL at 07/11/17 1208  . MEDLINE mouth rinse  15 mL Mouth Rinse q12n4p Regalado, Belkys A, MD   15 mL at 07/11/17 1533  . ondansetron (ZOFRAN) tablet 4 mg  4 mg Per Tube Q6H PRN Samella Parr, NP       Or  . ondansetron [facility]) injection 4 mg  4 mg Intravenous Q6H PRN Samella Parr, NP      . polyvinyl alcohol (LIQUIFILM TEARS) 1.4 % ophthalmic solution 1 drop  1 drop Both Eyes PRN Regalado, Belkys A, MD      . saccharomyces boulardii (FLORASTOR) capsule 250 mg  250 mg Per Tube BID Samella Parr, NP   250 mg at 07/11/17 1203  . sodium chloride flush (NS) 0.9 % injection 3 mL  3 mL Intravenous Q12H Samella Parr, NP   3 mL at 07/10/17 2320  . traMADol (ULTRAM) tablet 50 mg  50 mg Oral Q8H Samella Parr, NP   50 mg at 07/11/17 1453     Discharge Medications: Please see discharge summary for a list of discharge  medications.  Relevant Imaging Results:  Relevant Lab Results:   Additional Information SS#: [id number]  Candie Chroman, LCSW

## 2017-07-11 NOTE — Progress Notes (Signed)
Patient discharged to [place] facility in stable condition via [place].

## 2017-07-11 NOTE — Plan of Care (Signed)
  Problem: Elimination: Goal: Will not experience complications related to urinary retention Outcome: Progressing   Problem: Safety: Goal: Ability to remain free from injury will improve Outcome: Progressing   Problem: Skin Integrity: Goal: Risk for impaired skin integrity will decrease Outcome: Progressing   

## 2017-07-11 NOTE — Progress Notes (Signed)
Initial Nutrition Assessment  DOCUMENTATION CODES:   Underweight, Severe malnutrition in context of chronic illness  INTERVENTION:   -Continue bolus feedings of 237 ml Osmolite 1.5 QID via PEG  30 ml Prostat BID.    Tube feeding regimen provides 1622 kcal (100% of needs), 89 grams of protein, and 722 ml of H2O. Total free water: 1522 ml  NUTRITION DIAGNOSIS:   Severe Malnutrition related to chronic illness(CVA) as evidenced by moderate fat depletion, severe fat depletion, moderate muscle depletion, severe muscle depletion, percent weight loss.  GOAL:   Patient will meet greater than or equal to 90% of their needs  MONITOR:   Labs, Weight trends, TF tolerance, Skin, I & O's  REASON FOR ASSESSMENT:   Low [place], New TF, Malnutrition Screening Tool    ASSESSMENT:   Ledia Hanford is a 84 y.o. female with medical history significant for history of CVA with resultant right side hemiplegia and severe dysphagia requiring a PEG tube.  Pt admitted with SOB and hematemesis. Pt is a resident of [facility].   Pt admitted with SOB and hematemesis.   Reviewed SNF records [facility]; PTA TF regimen 237 ml Osmolite 1.5 QID with 30 ml Prostat AWC BID, which provides 1622 kcals, 93 grams protein, and 722 ml free water daily (meeting 100% of estimated nutritional needs).   No family at bedside to obtain further hx. Pt has a communication board that she uses to help communicate needs. Pt did not engage with RD at time of visit despite attempts to communicate with pt with communication board.   Unable to obtain most recent wt due to pt on specialized bed. Used most recent wt of 107# to estimate pt needs. Per chart review, pt has experienced a 10% wt loss over the past 3 months, which is significant for time frame.   Current TF regimen 237 ml Osmolite 1.5 QID, 30 ml Prostat BID, and 200 ml free water flush every 6 hours. Complete regimen provides 1622 kcals, 89 grams protein, and 1522 ml  free water daily, which meets 100% of estimated nutritional needs.   Labs reviewed.   NUTRITION - FOCUSED PHYSICAL EXAM:    Most Recent Value  Orbital Region  Moderate depletion  Upper Arm Region  Severe depletion  Thoracic and Lumbar Region  Unable to assess  Buccal Region  Moderate depletion  Temple Region  Severe depletion  Clavicle Bone Region  Severe depletion  Clavicle and Acromion Bone Region  Severe depletion  Scapular Bone Region  Severe depletion  Dorsal Hand  Moderate depletion  Patellar Region  Moderate depletion  Anterior Thigh Region  Moderate depletion  Posterior Calf Region  Severe depletion  Edema (RD Assessment)  None  Hair  Reviewed  Eyes  Reviewed  Mouth  Reviewed  Skin  Reviewed  Nails  Reviewed       Diet Order:   Diet Order           Diet NPO time specified Except for: Other (See Comments)  Diet effective now          EDUCATION NEEDS:   Not appropriate for education at this time  Skin:  Skin Assessment: Skin Integrity Issues: Skin Integrity Issues:: Stage III Stage III: rt sacrum  Last BM:  07/11/17  Height:   Ht Readings from Last 1 Encounters:  07/11/17 5\' 3"  (1.6 m)    Weight:   Wt Readings from Last 1 Encounters:  07/11/17 107 lb (48.5 kg)  Ideal Body Weight:  52.3 kg  BMI:  Body mass index is 18.95 kg/m.  Estimated Nutritional Needs:   Kcal:  1500-1700  Protein:  75-90 grams  Fluid:  > 1.5 L    Latrell Potempa A. Jimmye Norman, RD, LDN, CDE Pager: [phone] After hours Pager: [phone]

## 2017-07-11 NOTE — Discharge Summary (Addendum)
Physician Discharge Summary  Alyssa Savage EKC:[id number] DOB: [birth year] DOA: 07/09/2017  PCP: Hennie Duos, MD  Admit date: 07/09/2017 Discharge date: 07/11/2017  Admitted From: SNF Disposition:  SNF  Recommendations for Outpatient Follow-up:  1. Follow up with PCP in 1-2 weeks 2. Please obtain BMP/CBC in one week 3. Please monitor for dehydration., repeat B-met, increase free water as needed.     Discharge Condition: Stable.  CODE STATUS: full code.  Diet recommendation: NPO, tube feeding.   Brief/Interim Summary:  Brief Narrative: Alyssa Savage a 84 y.o.femalewith medical history significant forhistory of CVA with resultant right side hemiplegia and severe dysphagia requiring a PEG tube. Also history of hypertension with LV diastolic dysfunction, secondary expressive aphasia in context of prior stroke, stage II sacral decubitus, severe protein calorie malnutrition. Also a history of recurrent UTIs and C. difficile colitis. She was recently discharged on 5/5 after an admission for C. difficile colitis. Patient was treated with oral vancomycin andbased on discharge summary completed treatment on 5/15. According to family has not had any further issues with ongoing diarrhea. She did have Hemoccult positive stools in the context of acute colitis during the previous admission. Urine culture from 4/27 was positive for yeast and she was treated accordingly. This is recurrent noting similar microbiology March 2019 urine culture. Also documented she has a history of Pseudomonas UTI as well. Patient was sent to the ER today the nursing facility reports of shortness of breath and coffee-ground emesis. ER she was afebrile, mildly tachycardic with somewhat soft blood pressure readings. tniwas mildly elevated at 0.03 and given her respiratory symptoms and tachycardia and question of hemoptysis d-dimer was obtained which was elevated at 5.19.CTAthe chest did not reveal PE and  also did not reveal any edema or consolidation. Lactic acid was borderline elevated at 1.98 but repeat was 1.62. Patient did not have any leukocytosis. Her sodium was noted to be elevated at 154 with a BUN of 61 noting at time of discharge sodium was 143 and BUN was 15. In review of outpatient documentation on 5/7 outpatient cardiologist resumed low-dose Lasix due to concerns of possible pulmonary edema on clinical exam and after review of chest x-ray on 5/5 prior to discharge.     Assessment & Plan:   Principal Problem:   Acute hypernatremia Active Problems:   Dysphagia as late effect of stroke   Cognitive communication deficit   History of recurrent UTIs   Recurrent Clostridium difficile diarrhea   Severe protein-calorie malnutrition (HCC)   Hypertensive heart disease/LV Diastolic dysfunction   Positive D dimer   Troponin level elevated   Sacral decubitus ulcer, stage II  1-Hypernatremia;  Related to dehydration, lasix.  Hold lasix.  Treated with IV fluids, d 5.  On free water.  monitor B-met at rehab.   2-Hemoptysis; coffee ground emesis.  Received IV pepcid. Discharge on Ranitidine.  HB stable  No further episodes.   3-Dysphagia as late effect of stroke/PEG Tube/severe protein calorie malnutrition Continue with Osmolite 1.5.  Free water.   4-History of recurrent UTIs UA negative. UA with multiples morphotype.   Positive D dimer -CTA chest without PE  Sacral decubitus ulcer, stage III; local care. Transport planner.   Hypokalemia; Replaced.   History stroke.  Resume plavix.   HTN; resume Norvasc.   Loose stool; she had 2-3 loose stool last night. No BM this am. No abdominal pain,. No fever no leukocytosis.  Resolved.  Monitor. She has prior ho C diff.   Discharge Diagnoses:  Principal Problem:   Acute hypernatremia Active Problems:   Dysphagia as late effect of stroke   Cognitive communication deficit   History of recurrent UTIs   Recurrent  Clostridium difficile diarrhea   Severe protein-calorie malnutrition (HCC)   Hypertensive heart disease/LV Diastolic dysfunction   Positive D dimer   Troponin level elevated   Sacral decubitus ulcer, stage II    Discharge Instructions   Allergies as of 07/11/2017   No Known Allergies     Medication List    TAKE these medications   acetaminophen 325 MG tablet Commonly known as:  TYLENOL Place 650 mg into feeding tube every 6 (six) hours as needed for mild pain or moderate pain. Notify MD if not relieved. Special Requirement - Pre-Pain (Physician Order) Not to exceed 3072m in 24 hour period   amLODipine 5 MG tablet Commonly known as:  NORVASC Place 5 mg into feeding tube daily.   atorvastatin 40 MG tablet Commonly known as:  LIPITOR Place 1 tablet (40 mg total) into feeding tube daily at 6 PM.   bisacodyl 10 MG suppository Commonly known as:  DULCOLAX Place 1 suppository (10 mg total) rectally daily as needed for moderate constipation.   carboxymethylcellulose 0.5 % Soln Commonly known as:  REFRESH PLUS Place 1 drop into both eyes every 8 (eight) hours as needed (dry eyes).   carvedilol 6.25 MG tablet Commonly known as:  COREG Place 6.25 mg into feeding tube 2 (two) times daily with a meal. HYPERTENSIVE HEART DISEASE WITH HEART FAILURE   cetirizine 10 MG tablet Commonly known as:  ZYRTEC Place 10 mg into feeding tube daily.   cholestyramine 4 g packet Commonly known as:  QUESTRAN Place 4 g into feeding tube 2 (two) times daily. Mixed with 4 ounces of water   clopidogrel 75 MG tablet Commonly known as:  PLAVIX Place 1 tablet (75 mg total) into feeding tube daily.   famotidine 20 MG tablet Commonly known as:  PEPCID Take 1 tablet (20 mg total) by mouth daily.   feeding supplement (OSMOLITE 1.5 CAL) Liqd Place 1,000 mLs into feeding tube 4 (four) times daily. 1425 Kcal and 66g Pro daily ( May cont Osmolite 1.2 until Osmolite 1.5 available)   feeding supplement  (PRO-STAT SUGAR FREE 64) Liqd Place 30 mLs into feeding tube 2 (two) times daily.   FLORASTOR 250 MG capsule Generic drug:  saccharomyces boulardii 250 mg. GIVE 1 CAPSULE VIA PEG TUBE DAILY AS SUPPLEMENT   free water Soln Place 200 mLs into feeding tube every 6 (six) hours.   magnesium hydroxide 400 MG/5ML suspension Commonly known as:  MILK OF MAGNESIA Place 30 mLs into feeding tube daily as needed for mild constipation.   nitroGLYCERIN 0.4 MG SL tablet Commonly known as:  NITROSTAT Place 1 tablet (0.4 mg total) under the tongue every 5 (five) minutes as needed for chest pain.   PREVALON Misc by Does not apply route. Boots to bilateral feet QD- while in bed-may remove for AM/PM care 7 am on/off 3 pm on/off 11 pm on/off   PROSTAT PO 30 mLs by PEG Tube route 2 (two) times daily.   ranitidine 150 MG/10ML syrup Commonly known as:  ZANTAC Place 10 mLs (150 mg total) into feeding tube daily.   traMADol 50 MG tablet Commonly known as:  ULTRAM Take 50 mg by mouth 3 (three) times daily.       No Known Allergies  Consultations:  none   Procedures/Studies: Dg Chest 2 View  Result Date: 07/09/2017 CLINICAL DATA:  Shortness breath and hematemesis beginning this morning. EXAM: CHEST - 2 VIEW COMPARISON:  06/05/2017 FINDINGS: Stable borderline cardiomegaly. Stable ectasia of thoracic aorta. Both lungs are clear. No evidence of pleural effusion. IMPRESSION: Stable exam.  No active cardiopulmonary disease. Electronically Signed   By: Earle Gell M.D.   On: 07/09/2017 11:57   Ct Angio Chest Pe W And/or Wo Contrast  Result Date: 07/09/2017 CLINICAL DATA:  Shortness of breath hemoptysis EXAM: CT ANGIOGRAPHY CHEST WITH CONTRAST TECHNIQUE: Multidetector CT imaging of the chest was performed using the standard protocol during bolus administration of intravenous contrast. Multiplanar CT image reconstructions and MIPs were obtained to evaluate the vascular anatomy. CONTRAST:  19m  ISOVUE-370 IOPAMIDOL (ISOVUE-370) INJECTION 76% COMPARISON:  Chest radiograph July 09, 2017 FINDINGS: Cardiovascular: There is no demonstrable pulmonary embolus. There is no thoracic aortic aneurysm, although the aorta is somewhat ectatic. No dissection seen. It should be noted that the contrast bolus is not optimal for assessment for potential dissection. Visualized great vessels appear normal except for minimal calcification in the proximal left subclavian artery. There is no appreciable pericardial effusion or pericardial thickening. There is left ventricular hypertrophy. Mediastinum/Nodes: Thyroid appears unremarkable. There is no appreciable thoracic adenopathy. There are foci of air throughout much of the esophagus. There is a small hiatal hernia. Lungs/Pleura: There is no lung edema or consolidation. No pleural effusion or pleural thickening evident. There is slight lower lobe bronchiectatic change bilaterally. Upper Abdomen: Limited visualization in the upper abdomen appears grossly unremarkable. Musculoskeletal: No blastic or lytic bone lesions are evident. No chest wall lesions evident. Review of the MIP images confirms the above findings. IMPRESSION: 1. No demonstrable pulmonary embolus. No thoracic aortic aneurysm. Aorta is somewhat ectatic. No dissection seen. It must be cautioned that the contrast bolus is not optimal in the aorta for assessment for potential dissection. 2.  There is left ventricular hypertrophy. 3. No edema or consolidation. Slight lower lobe bronchiectasis noted. 4.  No appreciable thoracic adenopathy. 5.  Small hiatal hernia.  Air noted throughout esophagus. Electronically Signed   By: WLowella GripIII M.D.   On: 07/09/2017 14:30   (Echo, Carotid, EGD, Colonoscopy, ERCP)    Subjective:   Discharge Exam: Vitals:   07/10/17 1959 07/11/17 0629  BP: (!) 145/88 (!) 156/97  Pulse: 95 (!) 102  Resp: 16 18  Temp: 98.7 F (37.1 C) (!) 97.5 F (36.4 C)  SpO2: 100% 94%    Vitals:   07/10/17 1412 07/10/17 1959 07/11/17 0629 07/11/17 1030  BP: (!) 146/98 (!) 145/88 (!) 156/97   Pulse: 86 95 (!) 102   Resp: _0 Temp: 98.4 F (36.9 C) 98.7 F (37.1 C) (!) 97.5 F (36.4 C)   TempSrc: Oral Oral Oral   SpO2: 100% 100% 94%   Weight:    48.5 kg (107 lb)  Height:    5' 3" (1.6 m)    General: Pt is alert, awake, not in acute distress Cardiovascular: RRR, S1/S2 +, no rubs, no gallops Respiratory: CTA bilaterally, no wheezing, no rhonchi Abdominal: Soft, NT, ND, bowel sounds + Extremities: no edema, no cyanosis    The results of significant diagnostics from this hospitalization (including imaging, microbiology, ancillary and laboratory) are listed below for reference.     Microbiology: Recent Results (from the past 240 hour(s))  Culture, Urine     Status: Abnormal   Collection Time: 07/09/17  4:18 PM  Result Value Ref Range  Status   Specimen Description URINE, RANDOM  Final   Special Requests   Final    NONE Performed at [facility], [place] [address] [place], [place] [address]    Culture MULTIPLE SPECIES PRESENT, SUGGEST RECOLLECTION (A)  Final   Report Status 07/10/2017 FINAL  Final  MRSA PCR Screening     Status: None   Collection Time: 07/09/17  6:56 PM  Result Value Ref Range Status   MRSA by PCR NEGATIVE NEGATIVE Final    Comment:        The GeneXpert MRSA Assay (FDA approved for NASAL specimens only), is one component of a comprehensive MRSA colonization surveillance program. It is not intended to diagnose MRSA infection nor to guide or monitor treatment for MRSA infections. Performed at [facility], [place] [address], [place], [place] [address]      Labs: BNP (last 3 results) No results for input(s): BNP in the last 8760 hours. Basic Metabolic Panel: Recent Labs  Lab 07/09/17 1003 07/10/17 0631 07/11/17 0803  NA 154* 150* 142  K 3.9 3.4* 3.7  CL 123* 119* 115*  CO2 21* 21* 22  GLUCOSE 120* 137* 178*   BUN 61* 45* 31*  CREATININE 0.85 0.78 0.68  CALCIUM 9.0 8.9 8.8*   Liver Function Tests: Recent Labs  Lab 07/09/17 1003  AST 25  ALT 28  ALKPHOS 70  BILITOT 0.3  PROT 7.1  ALBUMIN 2.6*   No results for input(s): LIPASE, AMYLASE in the last 168 hours. No results for input(s): AMMONIA in the last 168 hours. CBC: Recent Labs  Lab 07/09/17 1003 07/10/17 0631 07/11/17 0803  WBC 10.4 7.6 9.6  NEUTROABS 7.8*  --   --   HGB 9.6* 10.0* 9.8*  HCT 32.3* 33.9* 32.9*  MCV 90.0 92.4 90.1  PLT 465* 406* 407*   Cardiac Enzymes: Recent Labs  Lab 07/09/17 1003  TROPONINI 0.03*   BNP: Invalid input(s): POCBNP CBG: No results for input(s): GLUCAP in the last 168 hours. D-Dimer Recent Labs    07/09/17 1237  DDIMER 5.19*   Hgb A1c No results for input(s): HGBA1C in the last 72 hours. Lipid Profile No results for input(s): CHOL, HDL, LDLCALC, TRIG, CHOLHDL, LDLDIRECT in the last 72 hours. Thyroid function studies No results for input(s): TSH, T4TOTAL, T3FREE, THYROIDAB in the last 72 hours.  Invalid input(s): FREET3 Anemia work up No results for input(s): VITAMINB12, FOLATE, FERRITIN, TIBC, IRON, RETICCTPCT in the last 72 hours. Urinalysis    Component Value Date/Time   COLORURINE YELLOW 07/09/2017 1545   APPEARANCEUR HAZY (A) 07/09/2017 1545   LABSPEC 1.016 07/09/2017 1545   PHURINE 5.0 07/09/2017 1545   GLUCOSEU NEGATIVE 07/09/2017 1545   HGBUR NEGATIVE 07/09/2017 1545   BILIRUBINUR NEGATIVE 07/09/2017 1545   KETONESUR NEGATIVE 07/09/2017 1545   PROTEINUR NEGATIVE 07/09/2017 1545   NITRITE NEGATIVE 07/09/2017 1545   LEUKOCYTESUR NEGATIVE 07/09/2017 1545   Sepsis Labs Invalid input(s): PROCALCITONIN,  WBC,  LACTICIDVEN Microbiology Recent Results (from the past 240 hour(s))  Culture, Urine     Status: Abnormal   Collection Time: 07/09/17  4:18 PM  Result Value Ref Range Status   Specimen Description URINE, RANDOM  Final   Special Requests   Final     NONE Performed at [facility], [place] [address], [place], [place] [address]    Culture MULTIPLE SPECIES PRESENT, SUGGEST RECOLLECTION (A)  Final   Report Status 07/10/2017 FINAL  Final  MRSA PCR Screening  Status: None   Collection Time: 07/09/17  6:56 PM  Result Value Ref Range Status   MRSA by PCR NEGATIVE NEGATIVE Final    Comment:        The GeneXpert MRSA Assay (FDA approved for NASAL specimens only), is one component of a comprehensive MRSA colonization surveillance program. It is not intended to diagnose MRSA infection nor to guide or monitor treatment for MRSA infections. Performed at [facility], [place] [address], [place], [place] [address]      Time coordinating discharge: 35 minutes.  SIGNED:   Elmarie Shiley, MD  [facility] 07/11/2017, 2:21 PM Pager [phone]  If 7PM-7AM, please contact night-coverage [email or web address] Password [email or web address]

## 2017-07-11 NOTE — Progress Notes (Signed)
CSW spoke with pt's daughter, [name], [place] for pt. Pt's daughter agreed to pt returning to [facility]. Pt's daughter gave verbal permission and asked CSW to call Cecilie Lowers. CSW spoke with Cecilie Lowers, informed Cecilie Lowers of decision to have pt return to [facility]. CSW updated pt's sister who is at bedside. CSW called [facility].   Number for Report: [phone] Room Number: [id number]  CSW called and scheduled PTAR for pt to be transported back to [facility].   Wendelyn Breslow, [facility]  [phone]

## 2017-07-12 ENCOUNTER — Non-Acute Institutional Stay (SKILLED_NURSING_FACILITY): Payer: Medicare Other | Admitting: Internal Medicine

## 2017-07-12 ENCOUNTER — Encounter: Payer: Self-pay | Admitting: Internal Medicine

## 2017-07-12 ENCOUNTER — Telehealth: Payer: Self-pay

## 2017-07-12 DIAGNOSIS — I110 Hypertensive heart disease with heart failure: Secondary | ICD-10-CM | POA: Diagnosis not present

## 2017-07-12 DIAGNOSIS — Z931 Gastrostomy status: Secondary | ICD-10-CM | POA: Diagnosis not present

## 2017-07-12 DIAGNOSIS — L89153 Pressure ulcer of sacral region, stage 3: Secondary | ICD-10-CM | POA: Diagnosis not present

## 2017-07-12 DIAGNOSIS — E785 Hyperlipidemia, unspecified: Secondary | ICD-10-CM | POA: Diagnosis not present

## 2017-07-12 DIAGNOSIS — R042 Hemoptysis: Secondary | ICD-10-CM

## 2017-07-12 DIAGNOSIS — I6930 Unspecified sequelae of cerebral infarction: Secondary | ICD-10-CM | POA: Diagnosis not present

## 2017-07-12 DIAGNOSIS — R7989 Other specified abnormal findings of blood chemistry: Secondary | ICD-10-CM | POA: Diagnosis not present

## 2017-07-12 DIAGNOSIS — E870 Hyperosmolality and hypernatremia: Secondary | ICD-10-CM | POA: Diagnosis not present

## 2017-07-12 DIAGNOSIS — I5022 Chronic systolic (congestive) heart failure: Secondary | ICD-10-CM | POA: Diagnosis not present

## 2017-07-12 DIAGNOSIS — I69391 Dysphagia following cerebral infarction: Secondary | ICD-10-CM

## 2017-07-12 DIAGNOSIS — E43 Unspecified severe protein-calorie malnutrition: Secondary | ICD-10-CM

## 2017-07-12 NOTE — Telephone Encounter (Signed)
Possible re-admission to facility. This is a patient you were seeing at [facility]. [facility] F/U is needed if patient was re-admitted to facility upon discharge. Hospital discharge from [facility] on 07/11/2017

## 2017-07-12 NOTE — Progress Notes (Signed)
:   Location:  [facility] Room Number: 421-D Place of Service:  SNF [id number])   Alyssa Duos, MD   Patient Care Team: Alyssa Duos, MD as PCP - General (Internal Medicine) Minus Breeding, MD as PCP - Cardiology (Cardiology)  Extended Emergency Contact Information Primary Emergency Contact: cromati,eartha Home Phone: [phone] Relation: Daughter Secondary Emergency Contact: Lenore Cordia Mobile Phone: [phone] Relation: Son     Allergies: Patient has no known allergies.  Chief Complaint  Patient presents with  . Readmit To SNF    Admit to [facility]    HPI: Patient is 84 y.o. female With history of CVA with right-sided hemiplegia, expressive aphasia, and severe dysphasia requiring a PEG tube.  Also history of hypertension with LV diastolic dysfunction, stage II sacral decubitus, severe protein malnutrition, history of recurrent UTIs, including Pseudomonas UTI, and C. difficile colitis.  In the ED patient had tachypnea and tachycardia with questionable hemoptysis and d-dimer was obtained which was elevated.  CTA chest did not reveal PE but also did not reveal any edema or consolidation.  Lactic acid was borderline elevated but repeat was improved.  Patient's sodium was noted to be 154 with a BUN of 61.  Review of patient documentation revealed that on 5/7 outpatient cardiology resume low-dose Lasix due to concern for possible pulmonary edema on clinical exam.  Was admitted to [facility] from 6/8-10 was treated for hypernatremia with D5W.  Patient received IV Pepcid for her hemoptysis and her hemoglobin was remained stable.  Patient was admitted to skilled nursing facility for OT/PT.  While at skilled nursing facility patient will be followed for history of stroke treated with Plavix, hypertension treated with Norvasc Coreg, and hyperlipidemia treated with Lipitor.  Past Medical History:  Diagnosis Date  . Abnormal liver function  test   . Cerebral infarct ([place])   . Cognitive communication deficit   . Difficulty walking   . Dysphagia   . Femoral neck fracture ([place]) 03/2017   left   . Gastrostomy status ([place])   . GERD (gastroesophageal reflux disease)   . Hemiparesis ([place])   . Hemiplegia ([place])   . Hyperlipidemia   . Hypertensive emergency 03/2017  . Hypertensive heart disease   . Hypokalemia 03/2017  . Muscle weakness   . Other hyperlipidemia   . Pain in left hip   . Presence of artificial hip, left   . Respiratory symptoms   . Severe protein-calorie malnutrition ([place])   . Stroke [facility])     Past Surgical History:  Procedure Laterality Date  . ANTERIOR APPROACH HEMI HIP ARTHROPLASTY Left 03/25/2017   Procedure: ANTERIOR APPROACH HEMI HIP ARTHROPLASTY;  Surgeon: Rod Can, MD;  Location: [facility];  Service: Orthopedics;  Laterality: Left;  . IR GASTROSTOMY TUBE MOD SED  03/31/2017    Allergies as of 07/12/2017   No Known Allergies     Medication List        Accurate as of 07/12/17 11:35 AM. Always use your most recent med list.          acetaminophen 325 MG tablet Commonly known as:  TYLENOL Place 650 mg into feeding tube every 6 (six) hours as needed for mild pain or moderate pain. Notify MD if not relieved. Special Requirement - Pre-Pain (Physician Order) Not to exceed 3000mg  in 24 hour period   amLODipine 5 MG tablet Commonly known as:  NORVASC Place 5 mg into feeding tube daily.   atorvastatin 40  MG tablet Commonly known as:  LIPITOR Place 1 tablet (40 mg total) into feeding tube daily at 6 PM.   bisacodyl 10 MG suppository Commonly known as:  DULCOLAX Place 1 suppository (10 mg total) rectally daily as needed for moderate constipation.   carboxymethylcellulose 0.5 % Soln Commonly known as:  REFRESH PLUS Place 1 drop into both eyes every 8 (eight) hours as needed (dry eyes).   carvedilol 6.25 MG tablet Commonly known as:  COREG Place 6.25 mg into feeding tube 2 (two) times daily  with a meal. HYPERTENSIVE HEART DISEASE WITH HEART FAILURE   cetirizine 10 MG tablet Commonly known as:  ZYRTEC Place 10 mg into feeding tube daily.   cholestyramine 4 g packet Commonly known as:  QUESTRAN Place 4 g into feeding tube 2 (two) times daily. Mixed with 4 ounces of water   clopidogrel 75 MG tablet Commonly known as:  PLAVIX Place 1 tablet (75 mg total) into feeding tube daily.   famotidine 20 MG tablet Commonly known as:  PEPCID Take 1 tablet (20 mg total) by mouth daily.   feeding supplement (OSMOLITE 1.5 CAL) Liqd Place 1,000 mLs into feeding tube 4 (four) times daily. 1425 Kcal and 66g Pro daily ( May cont Osmolite 1.2 until Osmolite 1.5 available)   feeding supplement (PRO-STAT SUGAR FREE 64) Liqd Place 30 mLs into feeding tube 2 (two) times daily.   FLORASTOR 250 MG capsule Generic drug:  saccharomyces boulardii 250 mg. GIVE 1 CAPSULE VIA PEG TUBE DAILY AS SUPPLEMENT   free water Soln Place 200 mLs into feeding tube every 6 (six) hours.   magnesium hydroxide 400 MG/5ML suspension Commonly known as:  MILK OF MAGNESIA Place 30 mLs into feeding tube daily as needed for mild constipation.   nitroGLYCERIN 0.4 MG SL tablet Commonly known as:  NITROSTAT Place 1 tablet (0.4 mg total) under the tongue every 5 (five) minutes as needed for chest pain.   PREVALON Misc by Does not apply route. Boots to bilateral feet QD- while in bed-may remove for AM/PM care 7 am on/off 3 pm on/off 11 pm on/off   PROSTAT PO 30 mLs by PEG Tube route 2 (two) times daily.   ranitidine 150 MG/10ML syrup Commonly known as:  ZANTAC Place 10 mLs (150 mg total) into feeding tube daily.   traMADol 50 MG tablet Commonly known as:  ULTRAM Take 50 mg by mouth 3 (three) times daily.       No orders of the defined types were placed in this encounter.    There is no immunization history on file for this patient.  Social History   Tobacco Use  . Smoking status: Never Smoker    . Smokeless tobacco: Never Used  Substance Use Topics  . Alcohol use: No    Frequency: Never    Family history is   Family History  Problem Relation Age of Onset  . CAD Mother   . Heart disease Mother       Review of Systems  DATA OBTAINED: from nurse GENERAL:  no fevers, fatigue, appetite changes SKIN: No itching, or rash EYES: No eye pain, redness, discharge EARS: No earache, tinnitus, change in hearing NOSE: No congestion, drainage or bleeding  MOUTH/THROAT: No mouth or tooth pain, No sore throat RESPIRATORY: No cough, wheezing, SOB CARDIAC: No chest pain, palpitations, lower extremity edema  GI: No abdominal pain, No N/V/D or constipation, No heartburn or reflux  GU: No dysuria, frequency or urgency, or incontinence  MUSCULOSKELETAL:  No unrelieved bone/joint pain NEUROLOGIC: No headache, dizziness or focal weakness PSYCHIATRIC: No c/o anxiety or sadness   Vitals:   07/12/17 1129  BP: (!) 156/97  Pulse: (!) 102  Resp: 18  Temp: (!) 97.5 F (36.4 C)  SpO2: 94%    SpO2 Readings from Last 1 Encounters:  07/12/17 94%   Body mass index is 18.95 kg/m.     Physical Exam  GENERAL APPEARANCE: Alert, non conversant,  No acute distress.  SKIN: No diaphoresis rash HEAD: Normocephalic, atraumatic  EYES: Conjunctiva/lids clear. Pupils round, reactive. EOMs intact.  EARS: External exam WNL, canals clear. Hearing grossly normal.  NOSE: No deformity or discharge.  MOUTH/THROAT: Lips w/o lesions  RESPIRATORY: Breathing is even, unlabored. Lung sounds are clear   CARDIOVASCULAR: Heart RRR no murmurs, rubs or gallops. No peripheral edema.   GASTROINTESTINAL: Abdomen is soft, non-tender, not distended w/ normal bowel sounds. GENITOURINARY: Bladder non tender, not distended  MUSCULOSKELETAL: No abnormal joints or musculature NEUROLOGIC:  Cranial nerves 2-12 grossly intact.  Right-sided weakness PSYCHIATRIC: Mood and affect appropriate to situation, no behavioral  issues  Patient Active Problem List   Diagnosis Date Noted  . Acute hypernatremia 07/09/2017  . Dysphagia as late effect of stroke 07/09/2017  . Cognitive communication deficit 07/09/2017  . History of recurrent UTIs 07/09/2017  . Recurrent Clostridium difficile diarrhea 07/09/2017  . Severe protein-calorie malnutrition ([place]) 07/09/2017  . Hypertensive heart disease/LV Diastolic dysfunction 79/03/4095  . Positive D dimer 07/09/2017  . Troponin level elevated 07/09/2017  . Sacral decubitus ulcer, stage II 07/09/2017  . History of cerebrovascular accident (CVA) with residual deficit 06/26/2017  . Hemiparesis ([place]) 06/26/2017  . Abnormal liver function test 06/26/2017  . Normocytic anemia 06/26/2017  . Aphasia 06/26/2017  . Hyperlipidemia 06/26/2017  . Sacral decubitus ulcer, stage III ([place]) 06/26/2017  . Chronic diarrhea 06/26/2017  . Allergic rhinitis 06/26/2017  . Clostridium difficile diarrhea   . Uterine leiomyoma   . Rectal bleeding   . Nausea and vomiting 05/28/2017  . Constipation   . Acute CVA (cerebrovascular accident) ([place]) 04/22/2017  . CKD (chronic kidney disease), stage III ([place]) 04/20/2017  . Acute encephalopathy 04/19/2017  . UTI (urinary tract infection) 04/19/2017  . Dysphagia 04/19/2017  . Displaced fracture of left femoral neck ([place]) 03/25/2017  . Protein-calorie malnutrition, severe 03/24/2017  . Fall 03/21/2017  . Fracture of femoral neck, left, closed ([place]) 03/21/2017  . AKI (acute kidney injury) ([place]) 03/21/2017  . Hypertensive emergency 03/21/2017  . Elevated troponin 03/21/2017  . Hypokalemia 03/21/2017  . Stroke ([place]) 03/21/2017  . GERD (gastroesophageal reflux disease)   . Closed fracture of left hip ([place])   . Prolonged Q-T interval on ECG       Labs reviewed: Basic Metabolic Panel:    Component Value Date/Time   NA 142 07/11/2017 0803   NA 143 06/07/2017 1428   K 3.7 07/11/2017 0803   CL 115 (H) 07/11/2017 0803   CO2 22 07/11/2017  0803   GLUCOSE 178 (H) 07/11/2017 0803   BUN 31 (H) 07/11/2017 0803   BUN 15 06/07/2017 1428   CREATININE 0.68 07/11/2017 0803   CALCIUM 8.8 (L) 07/11/2017 0803   PROT 7.1 07/09/2017 1003   ALBUMIN 2.6 (L) 07/09/2017 1003   AST 25 07/09/2017 1003   ALT 28 07/09/2017 1003   ALKPHOS 70 07/09/2017 1003   BILITOT 0.3 07/09/2017 1003   GFRNONAA >60 07/11/2017 0803   GFRAA >60 07/11/2017 0803    Recent Labs  03/25/17 0654 03/26/17 0215 03/27/17 0429 03/29/17 0917  07/09/17 1003 07/10/17 0631 07/11/17 0803  NA 146* 140 139 143   < > 154* 150* 142  K 3.4* 3.6 3.4* 3.7   < > 3.9 3.4* 3.7  CL 112* 109 109 109   < > 123* 119* 115*  CO2 23 21* 20* 24   < > 21* 21* 22  GLUCOSE 150* 170* 178* 122*   < > 120* 137* 178*  BUN 23* 25* 30* 27*   < > 61* 45* 31*  CREATININE 1.41* 1.41* 1.55* 1.18*   < > 0.85 0.78 0.68  CALCIUM 8.4* 7.8* 7.8* 8.4*   < > 9.0 8.9 8.8*  MG 1.6* 1.4* 1.9 1.7  --   --   --   --   PHOS 2.1* 2.4* 2.2*  --   --   --   --   --    < > = values in this interval not displayed.   Liver Function Tests: Recent Labs    05/31/17 0341 06/02/17 0454 07/09/17 1003  AST 56* 56* 25  ALT 68* 79* 28  ALKPHOS 64 64 70  BILITOT 0.4 0.3 0.3  PROT 4.9* 5.2* 7.1  ALBUMIN 1.8* 1.7* 2.6*   Recent Labs    05/27/17 2346  LIPASE 72*   Recent Labs    04/19/17 1421  AMMONIA 22   CBC: Recent Labs    04/24/17 0425 05/27/17 2346  07/09/17 1003 07/10/17 0631 07/11/17 0803  WBC 6.8 13.9*   < > 10.4 7.6 9.6  NEUTROABS 4.3 11.2*  --  7.8*  --   --   HGB 8.0* 10.1*   < > 9.6* 10.0* 9.8*  HCT 26.1* 32.4*   < > 32.3* 33.9* 32.9*  MCV 89.4 88.3   < > 90.0 92.4 90.1  PLT 334 377   < > 465* 406* 407*   < > = values in this interval not displayed.   Lipid Recent Labs    03/22/17 0344  CHOL 203*  HDL 68  LDLCALC 117*  TRIG 90    Cardiac Enzymes: Recent Labs    05/27/17 2346 05/28/17 0533 07/09/17 1003  TROPONINI 0.04* <0.03 0.03*   BNP: No results for  input(s): BNP in the last 8760 hours. No results found for: [facility] Lab Results  Component Value Date   HGBA1C 6.3 (H) 03/22/2017   No results found for: TSH Lab Results  Component Value Date   [id number] 973 04/19/2017   No results found for: FOLATE No results found for: IRON, TIBC, FERRITIN  Imaging and Procedures obtained prior to SNF admission: Dg Chest 2 View  Result Date: 07/09/2017 CLINICAL DATA:  Shortness breath and hematemesis beginning this morning. EXAM: CHEST - 2 VIEW COMPARISON:  06/05/2017 FINDINGS: Stable borderline cardiomegaly. Stable ectasia of thoracic aorta. Both lungs are clear. No evidence of pleural effusion. IMPRESSION: Stable exam.  No active cardiopulmonary disease. Electronically Signed   By: Earle Gell M.D.   On: 07/09/2017 11:57   Ct Angio Chest Pe W And/or Wo Contrast  Result Date: 07/09/2017 CLINICAL DATA:  Shortness of breath hemoptysis EXAM: CT ANGIOGRAPHY CHEST WITH CONTRAST TECHNIQUE: Multidetector CT imaging of the chest was performed using the standard protocol during bolus administration of intravenous contrast. Multiplanar CT image reconstructions and MIPs were obtained to evaluate the vascular anatomy. CONTRAST:  39mL ISOVUE-370 IOPAMIDOL (ISOVUE-370) INJECTION 76% COMPARISON:  Chest radiograph July 09, 2017 FINDINGS: Cardiovascular: There is no demonstrable pulmonary embolus.  There is no thoracic aortic aneurysm, although the aorta is somewhat ectatic. No dissection seen. It should be noted that the contrast bolus is not optimal for assessment for potential dissection. Visualized great vessels appear normal except for minimal calcification in the proximal left subclavian artery. There is no appreciable pericardial effusion or pericardial thickening. There is left ventricular hypertrophy. Mediastinum/Nodes: Thyroid appears unremarkable. There is no appreciable thoracic adenopathy. There are foci of air throughout much of the esophagus. There is a small  hiatal hernia. Lungs/Pleura: There is no lung edema or consolidation. No pleural effusion or pleural thickening evident. There is slight lower lobe bronchiectatic change bilaterally. Upper Abdomen: Limited visualization in the upper abdomen appears grossly unremarkable. Musculoskeletal: No blastic or lytic bone lesions are evident. No chest wall lesions evident. Review of the MIP images confirms the above findings. IMPRESSION: 1. No demonstrable pulmonary embolus. No thoracic aortic aneurysm. Aorta is somewhat ectatic. No dissection seen. It must be cautioned that the contrast bolus is not optimal in the aorta for assessment for potential dissection. 2.  There is left ventricular hypertrophy. 3. No edema or consolidation. Slight lower lobe bronchiectasis noted. 4.  No appreciable thoracic adenopathy. 5.  Small hiatal hernia.  Air noted throughout esophagus. Electronically Signed   By: Lowella Grip III M.D.   On: 07/09/2017 14:30     Not all labs, radiology exams or other studies done during hospitalization come through on my EPIC note; however they are reviewed by me.    Assessment and Plan  Hypernatremia- secondary to dehydration; Lasix was held patient was treated with D5 W and monitored with BMPs SNF -patient is admitted for OT/PT discharge sodium 142; will follow up BMP  Hemoptysis of coffee-ground emesis- patient received IV Pepcid; hemoglobin was considered stable and there were no further episodes of vomiting SNF -discharge hemoglobin is 9.8; will follow-up CBC; continue Zantac 150 mg daily  Dysphasia/PEG tube/severe protein malnutrition SNF -continue with Osmolite 1.5, protein supplement, and free water  Positive d-dimer- CTA of chest-negative for PE  Chronic decubitus ulcer, stage III SNF -will be followed by wound care  History of stroke SNF -75 mg daily  HTN SNF -we will; continue Norvasc 5 mg daily and Coreg 6.25 mg twice daily  Hyperlipidemia SNF -controlled; continue  40 mg daily   Time spent greater than 45 minutes;> 50% of time with patient was spent reviewing records, labs, tests and studies, counseling and developing plan of care  Alyssa Duos, MD

## 2017-07-15 ENCOUNTER — Non-Acute Institutional Stay (SKILLED_NURSING_FACILITY): Payer: Medicare Other | Admitting: Internal Medicine

## 2017-07-15 DIAGNOSIS — K922 Gastrointestinal hemorrhage, unspecified: Secondary | ICD-10-CM

## 2017-07-15 DIAGNOSIS — D62 Acute posthemorrhagic anemia: Secondary | ICD-10-CM | POA: Diagnosis not present

## 2017-07-16 ENCOUNTER — Encounter: Payer: Self-pay | Admitting: Internal Medicine

## 2017-07-16 DIAGNOSIS — R042 Hemoptysis: Secondary | ICD-10-CM | POA: Insufficient documentation

## 2017-07-16 DIAGNOSIS — Z931 Gastrostomy status: Secondary | ICD-10-CM | POA: Insufficient documentation

## 2017-07-18 ENCOUNTER — Emergency Department (HOSPITAL_COMMUNITY): Admission: EM | Admit: 2017-07-18 | Payer: Self-pay | Source: Home / Self Care

## 2017-07-18 ENCOUNTER — Inpatient Hospital Stay (HOSPITAL_COMMUNITY)
Admission: EM | Admit: 2017-07-18 | Discharge: 2017-07-21 | DRG: 380 | Disposition: A | Discharge status: Skilled Nursing Facility | Payer: Medicare Other | Source: Skilled Nursing Facility | Attending: Internal Medicine | Admitting: Internal Medicine

## 2017-07-18 ENCOUNTER — Encounter (HOSPITAL_COMMUNITY): Payer: Self-pay

## 2017-07-18 DIAGNOSIS — L89153 Pressure ulcer of sacral region, stage 3: Secondary | ICD-10-CM | POA: Diagnosis present

## 2017-07-18 DIAGNOSIS — K219 Gastro-esophageal reflux disease without esophagitis: Secondary | ICD-10-CM | POA: Diagnosis present

## 2017-07-18 DIAGNOSIS — N183 Chronic kidney disease, stage 3 unspecified: Secondary | ICD-10-CM | POA: Diagnosis present

## 2017-07-18 DIAGNOSIS — K922 Gastrointestinal hemorrhage, unspecified: Secondary | ICD-10-CM | POA: Diagnosis present

## 2017-07-18 DIAGNOSIS — Z96642 Presence of left artificial hip joint: Secondary | ICD-10-CM | POA: Diagnosis present

## 2017-07-18 DIAGNOSIS — K449 Diaphragmatic hernia without obstruction or gangrene: Secondary | ICD-10-CM | POA: Diagnosis present

## 2017-07-18 DIAGNOSIS — M25561 Pain in right knee: Secondary | ICD-10-CM | POA: Diagnosis present

## 2017-07-18 DIAGNOSIS — D649 Anemia, unspecified: Secondary | ICD-10-CM | POA: Diagnosis present

## 2017-07-18 DIAGNOSIS — R197 Diarrhea, unspecified: Secondary | ICD-10-CM | POA: Diagnosis present

## 2017-07-18 DIAGNOSIS — K210 Gastro-esophageal reflux disease with esophagitis: Secondary | ICD-10-CM | POA: Diagnosis present

## 2017-07-18 DIAGNOSIS — I69391 Dysphagia following cerebral infarction: Secondary | ICD-10-CM

## 2017-07-18 DIAGNOSIS — Z8249 Family history of ischemic heart disease and other diseases of the circulatory system: Secondary | ICD-10-CM | POA: Diagnosis not present

## 2017-07-18 DIAGNOSIS — Z7902 Long term (current) use of antithrombotics/antiplatelets: Secondary | ICD-10-CM

## 2017-07-18 DIAGNOSIS — K317 Polyp of stomach and duodenum: Secondary | ICD-10-CM | POA: Diagnosis present

## 2017-07-18 DIAGNOSIS — R4701 Aphasia: Secondary | ICD-10-CM | POA: Diagnosis not present

## 2017-07-18 DIAGNOSIS — I69351 Hemiplegia and hemiparesis following cerebral infarction affecting right dominant side: Secondary | ICD-10-CM | POA: Diagnosis not present

## 2017-07-18 DIAGNOSIS — D500 Iron deficiency anemia secondary to blood loss (chronic): Secondary | ICD-10-CM | POA: Diagnosis not present

## 2017-07-18 DIAGNOSIS — Z931 Gastrostomy status: Secondary | ICD-10-CM | POA: Diagnosis not present

## 2017-07-18 DIAGNOSIS — G8190 Hemiplegia, unspecified affecting unspecified side: Secondary | ICD-10-CM

## 2017-07-18 DIAGNOSIS — K228 Other specified diseases of esophagus: Secondary | ICD-10-CM | POA: Diagnosis present

## 2017-07-18 DIAGNOSIS — Z8619 Personal history of other infectious and parasitic diseases: Secondary | ICD-10-CM

## 2017-07-18 DIAGNOSIS — I1310 Hypertensive heart and chronic kidney disease without heart failure, with stage 1 through stage 4 chronic kidney disease, or unspecified chronic kidney disease: Secondary | ICD-10-CM | POA: Diagnosis present

## 2017-07-18 DIAGNOSIS — E876 Hypokalemia: Secondary | ICD-10-CM | POA: Diagnosis present

## 2017-07-18 DIAGNOSIS — Z681 Body mass index (BMI) 19 or less, adult: Secondary | ICD-10-CM | POA: Diagnosis not present

## 2017-07-18 DIAGNOSIS — K2211 Ulcer of esophagus with bleeding: Principal | ICD-10-CM | POA: Diagnosis present

## 2017-07-18 DIAGNOSIS — K209 Esophagitis, unspecified: Secondary | ICD-10-CM | POA: Diagnosis not present

## 2017-07-18 DIAGNOSIS — E43 Unspecified severe protein-calorie malnutrition: Secondary | ICD-10-CM | POA: Diagnosis present

## 2017-07-18 DIAGNOSIS — I639 Cerebral infarction, unspecified: Secondary | ICD-10-CM | POA: Diagnosis present

## 2017-07-18 DIAGNOSIS — I69320 Aphasia following cerebral infarction: Secondary | ICD-10-CM

## 2017-07-18 DIAGNOSIS — I6302 Cerebral infarction due to thrombosis of basilar artery: Secondary | ICD-10-CM | POA: Diagnosis not present

## 2017-07-18 LAB — RETICULOCYTES
RBC.: 2.56 MIL/uL — AB (ref 3.87–5.11)
RETIC COUNT ABSOLUTE: 217.6 10*3/uL — AB (ref 19.0–186.0)
Retic Ct Pct: 8.5 % — ABNORMAL HIGH (ref 0.4–3.1)

## 2017-07-18 LAB — COMPREHENSIVE METABOLIC PANEL WITH GFR
ALT: 35 U/L (ref 14–54)
AST: 36 U/L (ref 15–41)
Albumin: 2.3 g/dL — ABNORMAL LOW (ref 3.5–5.0)
Alkaline Phosphatase: 67 U/L (ref 38–126)
Anion gap: 7 (ref 5–15)
BUN: 39 mg/dL — ABNORMAL HIGH (ref 6–20)
CO2: 25 mmol/L (ref 22–32)
Calcium: 8.3 mg/dL — ABNORMAL LOW (ref 8.9–10.3)
Chloride: 113 mmol/L — ABNORMAL HIGH (ref 101–111)
Creatinine, Ser: 0.67 mg/dL (ref 0.44–1.00)
GFR calc Af Amer: >60 mL/min (ref >60)
GFR calc non Af Amer: >60 mL/min (ref >60)
Glucose, Bld: 119 mg/dL — ABNORMAL HIGH (ref 65–99)
Potassium: 3.2 mmol/L — ABNORMAL LOW (ref 3.5–5.1)
Sodium: 145 mmol/L (ref 135–145)
Total Bilirubin: 0.3 mg/dL (ref 0.3–1.2)
Total Protein: 5.7 g/dL — ABNORMAL LOW (ref 6.5–8.1)

## 2017-07-18 LAB — SAMPLE TO BLOOD BANK

## 2017-07-18 LAB — CBC WITH DIFFERENTIAL/PLATELET
BASOS ABS: 0 10*3/uL (ref 0.0–0.1)
Basophils Relative: 0 %
Eosinophils Absolute: 0.3 10*3/uL (ref 0.0–0.7)
Eosinophils Relative: 4 %
HEMATOCRIT: 22.7 % — AB (ref 36.0–46.0)
Hemoglobin: 7 g/dL — ABNORMAL LOW (ref 12.0–15.0)
Lymphocytes Relative: 25 %
Lymphs Abs: 1.9 10*3/uL (ref 0.7–4.0)
MCH: 27.3 pg (ref 26.0–34.0)
MCHC: 30.8 g/dL (ref 30.0–36.0)
MCV: 88.7 fL (ref 78.0–100.0)
Monocytes Absolute: 0.6 10*3/uL (ref 0.1–1.0)
Monocytes Relative: 8 %
NEUTROS ABS: 4.9 10*3/uL (ref 1.7–7.7)
Neutrophils Relative %: 63 %
Platelets: 488 10*3/uL — ABNORMAL HIGH (ref 150–400)
RBC: 2.56 MIL/uL — AB (ref 3.87–5.11)
RDW: 17.4 % — ABNORMAL HIGH (ref 11.5–15.5)
WBC: 7.7 10*3/uL (ref 4.0–10.5)

## 2017-07-18 LAB — PREPARE RBC (CROSSMATCH)

## 2017-07-18 LAB — POC OCCULT BLOOD, ED: Fecal Occult Bld: NEGATIVE

## 2017-07-18 LAB — FERRITIN: Ferritin: 21 ng/mL (ref 11–307)

## 2017-07-18 LAB — VITAMIN B12: VITAMIN B 12: 776 pg/mL (ref 180–914)

## 2017-07-18 LAB — IRON AND TIBC
Iron: 17 ug/dL — ABNORMAL LOW (ref 28–170)
Saturation Ratios: 6 % — ABNORMAL LOW (ref 10.4–31.8)
TIBC: 300 ug/dL (ref 250–450)
UIBC: 283 ug/dL

## 2017-07-18 LAB — FOLATE: Folate: 30.2 ng/mL (ref >5.9)

## 2017-07-18 MED ORDER — CARVEDILOL 6.25 MG PO TABS
6.2500 mg | ORAL_TABLET | Freq: Two times a day (BID) | ORAL | Status: DC
  Administered 2017-07-19 – 2017-07-21 (×5): 6.25 mg
  Filled 2017-07-18 (×5): qty 1

## 2017-07-18 MED ORDER — AMLODIPINE BESYLATE 5 MG PO TABS
5.0000 mg | ORAL_TABLET | Freq: Every day | ORAL | Status: DC
  Administered 2017-07-19 – 2017-07-21 (×3): 5 mg
  Filled 2017-07-18 (×3): qty 1

## 2017-07-18 MED ORDER — SODIUM CHLORIDE 0.9% FLUSH
3.0000 mL | Freq: Two times a day (BID) | INTRAVENOUS | Status: DC
  Administered 2017-07-19 – 2017-07-21 (×4): 3 mL via INTRAVENOUS

## 2017-07-18 MED ORDER — SODIUM CHLORIDE 0.9% FLUSH: 3.0000 mL | INTRAVENOUS | Status: DC | PRN

## 2017-07-18 MED ORDER — SODIUM CHLORIDE 0.9 % IV SOLN: 250.0000 mL | INTRAVENOUS | Status: DC | PRN

## 2017-07-18 MED ORDER — ATORVASTATIN CALCIUM 40 MG PO TABS
40.0000 mg | ORAL_TABLET | Freq: Every day | ORAL | Status: DC
  Administered 2017-07-19 – 2017-07-20 (×3): 40 mg
  Filled 2017-07-18 (×3): qty 1

## 2017-07-18 MED ORDER — CHOLESTYRAMINE 4 G PO PACK
4.0000 g | PACK | Freq: Two times a day (BID) | ORAL | Status: DC
  Administered 2017-07-19: 4 g
  Filled 2017-07-18 (×2): qty 1

## 2017-07-18 MED ORDER — PANTOPRAZOLE SODIUM 40 MG PO TBEC
40.0000 mg | DELAYED_RELEASE_TABLET | Freq: Every day | ORAL | Status: DC
  Administered 2017-07-19: 40 mg via ORAL
  Filled 2017-07-18: qty 1

## 2017-07-18 MED ORDER — ACETAMINOPHEN 325 MG PO TABS: 650.0000 mg | ORAL_TABLET | Freq: Four times a day (QID) | ORAL | Status: DC | PRN

## 2017-07-18 MED ORDER — SODIUM CHLORIDE 0.9 % IV SOLN: Freq: Once | INTRAVENOUS | Status: DC

## 2017-07-18 MED ORDER — TRAMADOL HCL 50 MG PO TABS
50.0000 mg | ORAL_TABLET | Freq: Three times a day (TID) | ORAL | Status: DC | PRN
  Administered 2017-07-19: 50 mg via ORAL
  Filled 2017-07-18: qty 1

## 2017-07-18 MED ORDER — ONDANSETRON HCL 4 MG PO TABS: 4.0000 mg | ORAL_TABLET | Freq: Four times a day (QID) | ORAL | Status: DC | PRN

## 2017-07-18 MED ORDER — ONDANSETRON HCL 4 MG/2ML IJ SOLN: 4.0000 mg | Freq: Four times a day (QID) | INTRAMUSCULAR | Status: DC | PRN

## 2017-07-18 NOTE — ED Notes (Signed)
Cecilie Lowers [phone].

## 2017-07-18 NOTE — ED Provider Notes (Addendum)
[facility] DEPT Provider Note   CSN: [id number] Arrival date & time: 07/18/17  1548     History   Chief Complaint Chief Complaint  Patient presents with  . low hemoglobin    HPI Alyssa Savage is a 84 y.o. female. Level 5 caveat due to aphasia. HPI Patient was sent in from nursing home with decreasing hemoglobins.  Hemoglobin is 6 today and was 8 a few days ago.  Recent admission to the hospital and had hematemesis that time.  Now sent in with decreasing hemoglobin.  Patient is a phasic due to previous stroke. Past Medical History:  Diagnosis Date  . Abnormal liver function test   . Cerebral infarct ([place])   . Cognitive communication deficit   . Difficulty walking   . Dysphagia   . Femoral neck fracture ([place]) 03/2017   left   . Gastrostomy status ([place])   . GERD (gastroesophageal reflux disease)   . Hemiparesis ([place])   . Hemiplegia ([place])   . Hyperlipidemia   . Hypertensive emergency 03/2017  . Hypertensive heart disease   . Hypokalemia 03/2017  . Muscle weakness   . Other hyperlipidemia   . Pain in left hip   . Presence of artificial hip, left   . Respiratory symptoms   . Severe protein-calorie malnutrition ([place])   . Stroke [facility])     Patient Active Problem List   Diagnosis Date Noted  . Hemoptysis 07/16/2017  . Status post insertion of percutaneous endoscopic gastrostomy (PEG) tube ([place]) 07/16/2017  . Acute hypernatremia 07/09/2017  . Dysphagia as late effect of stroke 07/09/2017  . Cognitive communication deficit 07/09/2017  . History of recurrent UTIs 07/09/2017  . Recurrent Clostridium difficile diarrhea 07/09/2017  . Severe protein-calorie malnutrition ([place]) 07/09/2017  . Hypertensive heart disease/LV Diastolic dysfunction 26/37/8588  . Positive D dimer 07/09/2017  . Troponin level elevated 07/09/2017  . Sacral decubitus ulcer, stage II 07/09/2017  . History of cerebrovascular accident (CVA) with residual deficit  06/26/2017  . Hemiparesis ([place]) 06/26/2017  . Abnormal liver function test 06/26/2017  . Normocytic anemia 06/26/2017  . Aphasia 06/26/2017  . Hyperlipidemia 06/26/2017  . Sacral decubitus ulcer, stage III ([place]) 06/26/2017  . Chronic diarrhea 06/26/2017  . Allergic rhinitis 06/26/2017  . Clostridium difficile diarrhea   . Uterine leiomyoma   . Rectal bleeding   . Nausea and vomiting 05/28/2017  . Constipation   . Acute CVA (cerebrovascular accident) ([place]) 04/22/2017  . CKD (chronic kidney disease), stage III ([place]) 04/20/2017  . Acute encephalopathy 04/19/2017  . UTI (urinary tract infection) 04/19/2017  . Dysphagia 04/19/2017  . Displaced fracture of left femoral neck ([place]) 03/25/2017  . Protein-calorie malnutrition, severe 03/24/2017  . Fall 03/21/2017  . Fracture of femoral neck, left, closed ([place]) 03/21/2017  . AKI (acute kidney injury) ([place]) 03/21/2017  . Hypertensive emergency 03/21/2017  . Elevated troponin 03/21/2017  . Hypokalemia 03/21/2017  . Stroke ([place]) 03/21/2017  . GERD (gastroesophageal reflux disease)   . Closed fracture of left hip ([place])   . Prolonged Q-T interval on ECG     Past Surgical History:  Procedure Laterality Date  . ANTERIOR APPROACH HEMI HIP ARTHROPLASTY Left 03/25/2017   Procedure: ANTERIOR APPROACH HEMI HIP ARTHROPLASTY;  Surgeon: Rod Can, MD;  Location: [facility];  Service: Orthopedics;  Laterality: Left;  . IR GASTROSTOMY TUBE MOD SED  03/31/2017     OB History   None      Home Medications    Prior to  Admission medications   Medication Sig Start Date End Date Taking? Authorizing Provider  acetaminophen (TYLENOL) 325 MG tablet Place 650 mg into feeding tube every 6 (six) hours as needed for mild pain or moderate pain. Notify MD if not relieved. Special Requirement - Pre-Pain (Physician Order) Not to exceed 3000mg  in 24 hour period    Yes [provider]  Amino Acids-Protein Hydrolys (FEEDING SUPPLEMENT, PRO-STAT SUGAR FREE  64,) LIQD Place 30 mLs into feeding tube 2 (two) times daily.    Yes [provider]  amLODipine (NORVASC) 5 MG tablet Place 5 mg into feeding tube daily.   Yes [provider]  atorvastatin (LIPITOR) 40 MG tablet Place 1 tablet (40 mg total) into feeding tube daily at 6 PM. 04/02/17  Yes Starla Link, Kshitiz, MD  bisacodyl (DULCOLAX) 10 MG suppository Place 1 suppository (10 mg total) rectally daily as needed for moderate constipation. 06/04/17  Yes Kayleen Memos, DO  carboxymethylcellulose (REFRESH PLUS) 0.5 % SOLN Place 1 drop into both eyes every 8 (eight) hours as needed (dry eyes).   Yes [provider]  carvedilol (COREG) 6.25 MG tablet Place 6.25 mg into feeding tube 2 (two) times daily with a meal. HYPERTENSIVE HEART DISEASE WITH HEART FAILURE   Yes [provider]  cetirizine (ZYRTEC) 10 MG tablet Place 10 mg into feeding tube daily.   Yes [provider]  cholestyramine (QUESTRAN) 4 g packet Place 4 g into feeding tube 2 (two) times daily. Mixed with 4 ounces of water   Yes [provider]  magnesium hydroxide (MILK OF MAGNESIA) 400 MG/5ML suspension Place 30 mLs into feeding tube daily as needed for mild constipation.   Yes [provider]  nitroGLYCERIN (NITROSTAT) 0.4 MG SL tablet Place 1 tablet (0.4 mg total) under the tongue every 5 (five) minutes as needed for chest pain. 04/02/17  Yes Aline August, MD  Nutritional Supplements (FEEDING SUPPLEMENT, OSMOLITE 1.5 CAL,) LIQD Place 1,000 mLs into feeding tube 4 (four) times daily. 1425 Kcal and 66g Pro daily ( May cont Osmolite 1.2 until Osmolite 1.5 available)   Yes [provider]  pantoprazole (PROTONIX) 40 MG tablet Take 40 mg by mouth daily.   Yes [provider]  ranitidine (ZANTAC) 150 MG/10ML syrup Place 10 mLs (150 mg total) into feeding tube daily. 06/05/17  Yes Hall, Carole N, DO  saccharomyces boulardii (FLORASTOR) 250 MG capsule 250 mg. GIVE 1 CAPSULE VIA PEG  TUBE DAILY AS SUPPLEMENT    Yes [provider]  Sodium Phosphates (RA SALINE ENEMA RE) Place 1 application rectally as needed (constipation).   Yes [provider]  traMADol (ULTRAM) 50 MG tablet Take 50 mg by mouth 3 (three) times daily.   Yes [provider]  Water For Irrigation, Sterile (FREE WATER) SOLN Place 200 mLs into feeding tube every 6 (six) hours. 07/11/17  Yes Regalado, Belkys A, MD  clopidogrel (PLAVIX) 75 MG tablet Place 1 tablet (75 mg total) into feeding tube daily. 04/26/17   Bonnell Public, MD  famotidine (PEPCID) 20 MG tablet Take 1 tablet (20 mg total) by mouth daily. 07/11/17 07/11/18  Regalado, Cassie Freer, MD  [facility]) MISC by Does not apply route. Boots to bilateral feet QD- while in bed-may remove for AM/PM care 7 am on/off 3 pm on/off 11 pm on/off    [provider]    Family History Family History  Problem Relation Age of Onset  . CAD Mother   .  Heart disease Mother     Social History Social History   Tobacco Use  . Smoking status: Never Smoker  . Smokeless tobacco: Never Used  Substance Use Topics  . Alcohol use: No    Frequency: Never  . Drug use: No     Allergies   Patient has no known allergies.   Review of Systems Review of Systems  Unable to perform ROS: Patient nonverbal     Physical Exam Updated Vital Signs BP 120/73   Pulse 90   Resp (!) 21   SpO2 100%   Physical Exam  Constitutional: She appears well-developed.  HENT:  Head: Atraumatic.  Neck: Neck supple.  Cardiovascular: Normal rate.  Pulmonary/Chest: No respiratory distress. She exhibits no tenderness.  Abdominal: There is no tenderness.  PEG tube in place  Musculoskeletal: She exhibits no tenderness.  Neurological: She is alert.  Patient is awake and will nod yes or no to some questions but is a phasic at baseline.  Chronic contractions on right side.  Skin: Skin is warm.     ED Treatments / Results   Labs (all labs ordered are listed, but only abnormal results are displayed) Labs Reviewed  COMPREHENSIVE METABOLIC PANEL - Abnormal; Notable for the following components:      Result Value   Potassium 3.2 (*)    Chloride 113 (*)    Glucose, Bld 119 (*)    BUN 39 (*)    Calcium 8.3 (*)    Total Protein 5.7 (*)    Albumin 2.3 (*)    All other components within normal limits  CBC WITH DIFFERENTIAL/PLATELET - Abnormal; Notable for the following components:   RBC 2.56 (*)    Hemoglobin 7.0 (*)    HCT 22.7 (*)    RDW 17.4 (*)    Platelets 488 (*)    All other components within normal limits  POC OCCULT BLOOD, ED  SAMPLE TO BLOOD BANK    EKG None  Radiology No results found.  Procedures Procedures (including critical care time)  Medications Ordered in ED Medications - No data to display   Initial Impression / Assessment and Plan / ED Course  I have reviewed the triage vital signs and the nursing notes.  Pertinent labs & imaging results that were available during my care of the patient were reviewed by me and considered in my medical decision making (see chart for details).     Patient with anemia.  Hemoglobin now 7.  Trending down.  Guaiac negative.  With continued decrease in hemoglobin will admit to hospitalist.  Vital signs appear stable.  [facility] will see patient in consult tomorrow.  Final Clinical Impressions(s) / ED Diagnoses   Final diagnoses:  Anemia, unspecified type    ED Discharge Orders    None       Davonna Belling, MD 07/18/17 1749    Davonna Belling, MD 07/18/17 2029

## 2017-07-18 NOTE — ED Triage Notes (Signed)
Patient coming in from [facility]. Patient had a hemoglobin of 8.3 on Friday and today 6.8. Patient is aphasic. Hx of stroke.

## 2017-07-18 NOTE — ED Notes (Signed)
Transport called to move pt upstairs

## 2017-07-18 NOTE — ED Notes (Signed)
ED TO INPATIENT HANDOFF REPORT  Name/Age/Gender Alyssa Savage 84 y.o. female  Code Status Code Status History    Date Active Date Inactive Code Status Order ID Comments User Context   07/09/2017 1700 07/12/2017 0138 Full Code [id number]  Samella Parr, NP ED   05/28/2017 0812 06/05/2017 1615 Full Code [id number]  Rondel Jumbo, PA-C ED   04/19/2017 1718 04/25/2017 1726 Full Code [id number]  Radene Gunning, NP ED   03/21/2017 2311 04/04/2017 2048 Full Code [id number]  Ivor Costa, MD ED    Advance Directive Documentation     Most Recent Value  Type of Advance Directive  Healthcare Power of Attorney, Living will  Pre-existing out of facility DNR order (yellow form or pink MOST form)  -  "MOST" Form in Place?  -      Home/SNF/Other Nursing Home  Chief Complaint low hemoglobin  Level of Care/Admitting Diagnosis ED Disposition    ED Disposition Condition [place]: [facility] [[id number]]  Level of Care: Med-Surg [16]  Diagnosis: GIB (gastrointestinal bleeding) [141030]  Admitting Physician: Phillips Grout [[id number]]  Attending Physician: Derrill Kay A [[id number]]  Estimated length of stay: past midnight tomorrow  Certification:: I certify this patient will need inpatient services for at least 2 midnights  PT Class (Do Not Modify): Inpatient [101]  PT Acc Code (Do Not Modify): Private [1]       Medical History Past Medical History:  Diagnosis Date  . Abnormal liver function test   . Cerebral infarct ([place])   . Cognitive communication deficit   . Difficulty walking   . Dysphagia   . Femoral neck fracture ([place]) 03/2017   left   . Gastrostomy status ([place])   . GERD (gastroesophageal reflux disease)   . Hemiparesis ([place])   . Hemiplegia ([place])   . Hyperlipidemia   . Hypertensive emergency 03/2017  . Hypertensive heart disease   . Hypokalemia 03/2017  . Muscle weakness   . Other hyperlipidemia   . Pain in left hip   . Presence of artificial  hip, left   . Respiratory symptoms   . Severe protein-calorie malnutrition ([place])   . Stroke [facility])     Allergies No Known Allergies  IV Location/Drains/Wounds Patient Lines/Drains/Airways Status   Active Line/Drains/Airways    Name:   Placement date:   Placement time:   Site:   Days:   Peripheral IV 07/18/17 Right Forearm   07/18/17    1632    Forearm   less than 1   External Urinary Catheter   07/09/17    1800    -   9   Pressure Injury 07/09/17 Stage III -  Full thickness tissue loss. Subcutaneous fat may be visible but bone, tendon or muscle are NOT exposed. Stage 3 Pressure Injury   07/09/17    1803     9          Labs/Imaging Results for orders placed or performed during the hospital encounter of 07/18/17 (from the past 48 hour(s))  Sample to Blood Bank     Status: None   Collection Time: 07/18/17  4:18 PM  Result Value Ref Range   Blood Bank Specimen SAMPLE AVAILABLE FOR TESTING    Sample Expiration      07/21/2017 Performed at [facility], [place] [address], [place], [place] [address]   Comprehensive metabolic panel     Status: Abnormal   Collection Time: 07/18/17  4:27 PM  Result Value Ref Range   Sodium 145 135 - 145 mmol/L   Potassium 3.2 (L) 3.5 - 5.1 mmol/L   Chloride 113 (H) 101 - 111 mmol/L   CO2 25 22 - 32 mmol/L   Glucose, Bld 119 (H) 65 - 99 mg/dL   BUN 39 (H) 6 - 20 mg/dL   Creatinine, Ser 0.67 0.44 - 1.00 mg/dL   Calcium 8.3 (L) 8.9 - 10.3 mg/dL   Total Protein 5.7 (L) 6.5 - 8.1 g/dL   Albumin 2.3 (L) 3.5 - 5.0 g/dL   AST 36 15 - 41 U/L   ALT 35 14 - 54 U/L   Alkaline Phosphatase 67 38 - 126 U/L   Total Bilirubin 0.3 0.3 - 1.2 mg/dL   GFR calc non Af Amer >60 >60 mL/min   GFR calc Af Amer >60 >60 mL/min    Comment: (NOTE) The eGFR has been calculated using the CKD EPI equation. This calculation has not been validated in all clinical situations. eGFR's persistently <60 mL/min signify possible Chronic Kidney Disease.    Anion  gap 7 5 - 15    Comment: Performed at [facility], [place] [address], [place], [place] [address]  CBC with Differential     Status: Abnormal   Collection Time: 07/18/17  4:27 PM  Result Value Ref Range   WBC 7.7 4.0 - 10.5 K/uL   RBC 2.56 (L) 3.87 - 5.11 MIL/uL   Hemoglobin 7.0 (L) 12.0 - 15.0 g/dL   HCT 22.7 (L) 36.0 - 46.0 %   MCV 88.7 78.0 - 100.0 fL   MCH 27.3 26.0 - 34.0 pg   MCHC 30.8 30.0 - 36.0 g/dL   RDW 17.4 (H) 11.5 - 15.5 %   Platelets 488 (H) 150 - 400 K/uL   Neutrophils Relative % 63 %   Neutro Abs 4.9 1.7 - 7.7 K/uL   Lymphocytes Relative 25 %   Lymphs Abs 1.9 0.7 - 4.0 K/uL   Monocytes Relative 8 %   Monocytes Absolute 0.6 0.1 - 1.0 K/uL   Eosinophils Relative 4 %   Eosinophils Absolute 0.3 0.0 - 0.7 K/uL   Basophils Relative 0 %   Basophils Absolute 0.0 0.0 - 0.1 K/uL    Comment: Performed at [facility], [place] [address] [place], [place] [address]  POC occult blood, ED     Status: None   Collection Time: 07/18/17  4:31 PM  Result Value Ref Range   Fecal Occult Bld NEGATIVE NEGATIVE   No results found.  Pending Labs Unresulted Labs (From admission, onward)   None      Vitals/Pain Today's Vitals   07/18/17 1833 07/18/17 1900 07/18/17 1930 07/18/17 2000  BP:  120/74 116/87 124/73  Pulse: 94 91 (!) 52 89  Resp: _0 SpO2: 99% 99% 98% 97%    Isolation Precautions No active isolations  Medications Medications - No data to display  Mobility non-ambulatory

## 2017-07-18 NOTE — H&P (Signed)
History and Physical    [name] DOB: 1930/06/21 DOA: 07/18/2017  PCP: Hennie Duos, MD  Patient coming from: snf  Chief Complaint: Abnormal lab  HPI: Alyssa Savage is a 84 y.o. female with medical history significant of hypertension, hyperlipidemia, status post stroke with aphasia and right-sided hemiparesis was hospitalized a couple weeks ago for gross hematemesis.  Patient was discharged on PPI.  Her hemoglobin at that time was 10.  Patient has been in a nursing home and a couple days ago started having gross hematemesis again reported by family members.  Labs were checked patient's hemoglobin was 7 therefore patient was sent to the ED by the nursing home for an abnormal lab.  Patient cannot provide any history herself however her sisters report that she did have several episodes of coffee-ground emesis at the nursing home since her discharge from  [facility] last week.  Patient denies any pain.  Denies any diarrhea.  Patient referred for admission for GI bleed.  In the emergency department she is heme negative with a normal rectal exam per ED.  Review of Systems: As per HPI otherwise 10 point review of systems negative provided by sisters.  Past Medical History:  Diagnosis Date  . Abnormal liver function test   . Cerebral infarct ([place])   . Cognitive communication deficit   . Difficulty walking   . Dysphagia   . Femoral neck fracture ([place]) 03/2017   left   . Gastrostomy status ([place])   . GERD (gastroesophageal reflux disease)   . Hemiparesis ([place])   . Hemiplegia ([place])   . Hyperlipidemia   . Hypertensive emergency 03/2017  . Hypertensive heart disease   . Hypokalemia 03/2017  . Muscle weakness   . Other hyperlipidemia   . Pain in left hip   . Presence of artificial hip, left   . Respiratory symptoms   . Severe protein-calorie malnutrition ([place])   . Stroke [facility])     Past Surgical History:  Procedure Laterality Date  . ANTERIOR APPROACH HEMI HIP  ARTHROPLASTY Left 03/25/2017   Procedure: ANTERIOR APPROACH HEMI HIP ARTHROPLASTY;  Surgeon: Rod Can, MD;  Location: [place];  Service: Orthopedics;  Laterality: Left;  . IR GASTROSTOMY TUBE MOD SED  03/31/2017     reports that she has never smoked. She has never used smokeless tobacco. She reports that she does not drink alcohol or use drugs.  No Known Allergies  Family History  Problem Relation Age of Onset  . CAD Mother   . Heart disease Mother     Prior to Admission medications   Medication Sig Start Date End Date Taking? Authorizing Provider  acetaminophen (TYLENOL) 325 MG tablet Place 650 mg into feeding tube every 6 (six) hours as needed for mild pain or moderate pain. Notify MD if not relieved. Special Requirement - Pre-Pain (Physician Order) Not to exceed 3000mg  in 24 hour period    Yes [provider]  Amino Acids-Protein Hydrolys (FEEDING SUPPLEMENT, PRO-STAT SUGAR FREE 64,) LIQD Place 30 mLs into feeding tube 2 (two) times daily.    Yes [provider]  amLODipine (NORVASC) 5 MG tablet Place 5 mg into feeding tube daily.   Yes [provider]  atorvastatin (LIPITOR) 40 MG tablet Place 1 tablet (40 mg total) into feeding tube daily at 6 PM. 04/02/17  Yes Starla Link, Kshitiz, MD  bisacodyl (DULCOLAX) 10 MG suppository Place 1 suppository (10 mg total) rectally daily as needed for moderate constipation. 06/04/17  Yes [place] Crane,  Lorenda Cahill, DO  carboxymethylcellulose (REFRESH PLUS) 0.5 % SOLN Place 1 drop into both eyes every 8 (eight) hours as needed (dry eyes).   Yes [provider]  carvedilol (COREG) 6.25 MG tablet Place 6.25 mg into feeding tube 2 (two) times daily with a meal. HYPERTENSIVE HEART DISEASE WITH HEART FAILURE   Yes [provider]  cetirizine (ZYRTEC) 10 MG tablet Place 10 mg into feeding tube daily.   Yes [provider]  cholestyramine (QUESTRAN) 4 g packet Place 4 g into feeding tube 2 (two) times daily. Mixed with 4  ounces of water   Yes [provider]  magnesium hydroxide (MILK OF MAGNESIA) 400 MG/5ML suspension Place 30 mLs into feeding tube daily as needed for mild constipation.   Yes [provider]  nitroGLYCERIN (NITROSTAT) 0.4 MG SL tablet Place 1 tablet (0.4 mg total) under the tongue every 5 (five) minutes as needed for chest pain. 04/02/17  Yes Aline August, MD  Nutritional Supplements (FEEDING SUPPLEMENT, OSMOLITE 1.5 CAL,) LIQD Place 1,000 mLs into feeding tube 4 (four) times daily. 1425 Kcal and 66g Pro daily ( May cont Osmolite 1.2 until Osmolite 1.5 available)   Yes [provider]  pantoprazole (PROTONIX) 40 MG tablet Take 40 mg by mouth daily.   Yes [provider]  ranitidine (ZANTAC) 150 MG/10ML syrup Place 10 mLs (150 mg total) into feeding tube daily. 06/05/17  Yes Hall, Carole N, DO  saccharomyces boulardii (FLORASTOR) 250 MG capsule 250 mg. GIVE 1 CAPSULE VIA PEG TUBE DAILY AS SUPPLEMENT    Yes [provider]  Sodium Phosphates (RA SALINE ENEMA RE) Place 1 application rectally as needed (constipation).   Yes [provider]  traMADol (ULTRAM) 50 MG tablet Take 50 mg by mouth 3 (three) times daily.   Yes [provider]  Water For Irrigation, Sterile (FREE WATER) SOLN Place 200 mLs into feeding tube every 6 (six) hours. 07/11/17  Yes Regalado, Belkys A, MD  clopidogrel (PLAVIX) 75 MG tablet Place 1 tablet (75 mg total) into feeding tube daily. 04/26/17   Bonnell Public, MD  famotidine (PEPCID) 20 MG tablet Take 1 tablet (20 mg total) by mouth daily. 07/11/17 07/11/18  Regalado, Cassie Freer, MD  [employer] [facility]) MISC by Does not apply route. Boots to bilateral feet QD- while in bed-may remove for AM/PM care 7 am on/off 3 pm on/off 11 pm on/off    [provider]    Physical Exam: Vitals:   07/18/17 1833 07/18/17 1900 07/18/17 1930 07/18/17 2000  BP:  120/74 116/87 124/73  Pulse: 94 91 (!) 52 89  Resp: 17  11 20 14   SpO2: 99% 99% 98% 97%      Constitutional: NAD, calm, comfortable aphasic Vitals:   07/18/17 1833 07/18/17 1900 07/18/17 1930 07/18/17 2000  BP:  120/74 116/87 124/73  Pulse: 94 91 (!) 52 89  Resp: 17 11 20 14   SpO2: 99% 99% 98% 97%   Eyes: PERRL, lids and conjunctivae normal ENMT: Mucous membranes are moist. Posterior pharynx clear of any exudate or lesions.Normal dentition.  Neck: normal, supple, no masses, no thyromegaly Respiratory: clear to auscultation bilaterally, no wheezing, no crackles. Normal respiratory effort. No accessory muscle use.  Cardiovascular: Regular rate and rhythm, no murmurs / rubs / gallops. No extremity edema. 2+ pedal pulses. No carotid bruits.  Abdomen: no tenderness, no masses palpated. No hepatosplenomegaly. Bowel sounds positive.  Musculoskeletal: no clubbing / cyanosis. No joint deformity upper and  lower extremities. Good ROM, no contractures. Normal muscle tone.  Skin: no rashes, lesions, ulcers. No induration Neurologic: CN 2-12 grossly intact.  Right-sided hemiparesis  psychiatric: Not normal judgment and insight. Alert and aphasic. Normal mood.    Labs on Admission: I have personally reviewed following labs and imaging studies  CBC: Recent Labs  Lab 07/18/17 1627  WBC 7.7  NEUTROABS 4.9  HGB 7.0*  HCT 22.7*  MCV 88.7  PLT 962*   Basic Metabolic Panel: Recent Labs  Lab 07/18/17 1627  NA 145  K 3.2*  CL 113*  CO2 25  GLUCOSE 119*  BUN 39*  CREATININE 0.67  CALCIUM 8.3*   GFR: Estimated Creatinine Clearance: 40.1 mL/min (by C-G formula based on SCr of 0.67 mg/dL). Liver Function Tests: Recent Labs  Lab 07/18/17 1627  AST 36  ALT 35  ALKPHOS 67  BILITOT 0.3  PROT 5.7*  ALBUMIN 2.3*   No results for input(s): LIPASE, AMYLASE in the last 168 hours. No results for input(s): AMMONIA in the last 168 hours. Coagulation Profile: No results for input(s): INR, PROTIME in the last 168 hours. Cardiac Enzymes: No  results for input(s): CKTOTAL, CKMB, CKMBINDEX, TROPONINI in the last 168 hours. BNP (last 3 results) No results for input(s): PROBNP in the last 8760 hours. HbA1C: No results for input(s): HGBA1C in the last 72 hours. CBG: No results for input(s): GLUCAP in the last 168 hours. Lipid Profile: No results for input(s): CHOL, HDL, LDLCALC, TRIG, CHOLHDL, LDLDIRECT in the last 72 hours. Thyroid Function Tests: No results for input(s): TSH, T4TOTAL, FREET4, T3FREE, THYROIDAB in the last 72 hours. Anemia Panel: No results for input(s): VITAMINB12, FOLATE, FERRITIN, TIBC, IRON, RETICCTPCT in the last 72 hours. Urine analysis:    Component Value Date/Time   COLORURINE YELLOW 07/09/2017 1545   APPEARANCEUR HAZY (A) 07/09/2017 1545   LABSPEC 1.016 07/09/2017 1545   PHURINE 5.0 07/09/2017 1545   GLUCOSEU NEGATIVE 07/09/2017 1545   HGBUR NEGATIVE 07/09/2017 1545   BILIRUBINUR NEGATIVE 07/09/2017 1545   KETONESUR NEGATIVE 07/09/2017 1545   PROTEINUR NEGATIVE 07/09/2017 1545   NITRITE NEGATIVE 07/09/2017 1545   LEUKOCYTESUR NEGATIVE 07/09/2017 1545   Sepsis Labs: !!!!!!!!!!!!!!!!!!!!!!!!!!!!!!!!!!!!!!!!!!!! @LABRCNTIP (procalcitonin:4,lacticidven:4) ) Recent Results (from the past 240 hour(s))  Culture, Urine     Status: Abnormal   Collection Time: 07/09/17  4:18 PM  Result Value Ref Range Status   Specimen Description URINE, RANDOM  Final   Special Requests   Final    NONE Performed at [facility], [place] [address] [place], [place] [address]    Culture MULTIPLE SPECIES PRESENT, SUGGEST RECOLLECTION (A)  Final   Report Status 07/10/2017 FINAL  Final  MRSA PCR Screening     Status: None   Collection Time: 07/09/17  6:56 PM  Result Value Ref Range Status   MRSA by PCR NEGATIVE NEGATIVE Final    Comment:        The GeneXpert MRSA Assay (FDA approved for NASAL specimens only), is one component of a comprehensive MRSA colonization surveillance program. It is not intended to  diagnose MRSA infection nor to guide or monitor treatment for MRSA infections. Performed at [facility], [place] [address], [place], [place] [address]      Radiological Exams on Admission: No results found.  Old chart reviewed  Case discussed with EDP Dr. Alvino Chapel   Assessment/Plan 84 year old female with upper GI bleed which is recurrent despite PPI with a hemoglobin of 7 Principal Problem:  GIB (gastrointestinal bleeding)-no evidence of overt bleeding at this time.  Continue PPI.  Transfuse 2 units of packed red blood cells overnight.  GI consulted Dr. Carlean Purl will see in the morning to evaluate for possible EGD.  Vital signs are stable.  Check anemia panel.  Patient's Plavix is also been on hold since last week.  Active Problems:   Normocytic anemia-check anemia panel.  Transfuse 2 units of blood.  Patient's hemoglobin has dropped from 10-7 since last week    GERD (gastroesophageal reflux disease)-PPI    CKD (chronic kidney disease), stage III (HCC) stable at this time-    Hemiparesis (HCC)-stable, history of CVA Plavix has been on hold for over a week.    Aphasia-stable   Med requisition is pending pharmacy review  DVT prophylaxis: SCDs Code Status: Full Family Communication: Sisters Disposition Plan: 1 to 3 days Consults called: [facility] Dr. Carlean Purl Admission status:Admission   Alyssa Savage A MD [facility]  If 7PM-7AM, please contact night-coverage [email or web address] Password [email or web address]  07/18/2017, 8:25 PM

## 2017-07-18 NOTE — ED Notes (Signed)
Bed: WA23 Expected date:  Expected time:  Means of arrival:  Comments: EMS-low HgB 

## 2017-07-19 ENCOUNTER — Other Ambulatory Visit: Payer: Self-pay

## 2017-07-19 DIAGNOSIS — D500 Iron deficiency anemia secondary to blood loss (chronic): Secondary | ICD-10-CM

## 2017-07-19 LAB — BASIC METABOLIC PANEL
ANION GAP: 8 (ref 5–15)
BUN: 31 mg/dL — AB (ref 6–20)
CALCIUM: 8.6 mg/dL — AB (ref 8.9–10.3)
CO2: 23 mmol/L (ref 22–32)
Chloride: 115 mmol/L — ABNORMAL HIGH (ref 101–111)
Creatinine, Ser: 0.72 mg/dL (ref 0.44–1.00)
GFR calc Af Amer: >60 mL/min (ref >60)
Glucose, Bld: 89 mg/dL (ref 65–99)
POTASSIUM: 3.3 mmol/L — AB (ref 3.5–5.1)
SODIUM: 146 mmol/L — AB (ref 135–145)

## 2017-07-19 LAB — CBC
HEMATOCRIT: 33.3 % — AB (ref 36.0–46.0)
HEMOGLOBIN: 10.8 g/dL — AB (ref 12.0–15.0)
MCH: 28.2 pg (ref 26.0–34.0)
MCHC: 32.4 g/dL (ref 30.0–36.0)
MCV: 86.9 fL (ref 78.0–100.0)
Platelets: 430 10*3/uL — ABNORMAL HIGH (ref 150–400)
RBC: 3.83 MIL/uL — ABNORMAL LOW (ref 3.87–5.11)
RDW: 15.9 % — AB (ref 11.5–15.5)
WBC: 7.5 10*3/uL (ref 4.0–10.5)

## 2017-07-19 LAB — ABO/RH: ABO/RH(D): B POS

## 2017-07-19 MED ORDER — CHOLESTYRAMINE LIGHT 4 G PO PACK
4.0000 g | PACK | Freq: Two times a day (BID) | ORAL | Status: DC
  Administered 2017-07-19 – 2017-07-21 (×3): 4 g via ORAL
  Filled 2017-07-19 (×4): qty 1

## 2017-07-19 MED ORDER — POTASSIUM CHLORIDE 20 MEQ/15ML (10%) PO SOLN
40.0000 meq | ORAL | Status: AC
  Administered 2017-07-19 (×2): 40 meq via ORAL
  Filled 2017-07-19 (×2): qty 30

## 2017-07-19 MED ORDER — TRAMADOL HCL 50 MG PO TABS
50.0000 mg | ORAL_TABLET | Freq: Three times a day (TID) | ORAL | Status: DC | PRN
  Administered 2017-07-19: 50 mg
  Filled 2017-07-19: qty 1

## 2017-07-19 MED ORDER — SODIUM CHLORIDE 0.9 % IV SOLN: INTRAVENOUS | Status: DC

## 2017-07-19 MED ORDER — PANTOPRAZOLE SODIUM 40 MG PO PACK
40.0000 mg | PACK | Freq: Two times a day (BID) | ORAL | Status: DC
  Administered 2017-07-19 – 2017-07-21 (×5): 40 mg
  Filled 2017-07-19 (×5): qty 20

## 2017-07-19 MED ORDER — SODIUM CHLORIDE 0.9 % IV SOLN
INTRAVENOUS | Status: DC
  Administered 2017-07-19 – 2017-07-21 (×5): via INTRAVENOUS

## 2017-07-19 MED ORDER — PANTOPRAZOLE SODIUM 40 MG PO PACK
40.0000 mg | PACK | Freq: Every day | ORAL | Status: DC
  Filled 2017-07-19: qty 20

## 2017-07-19 NOTE — Consult Note (Signed)
[facility] Consult  Referring Provider: Phillips Grout, MD Primary Care Physician:  Hennie Duos, MD Primary Gastroenterologist: Althia Forts  Reason for Consultation:  Coffee-ground emesis  HPI: Alyssa Savage is a 84 y.o. female it is a nursing home patient with a aphasia and right-sided  hemiparesis, who was transferred from nursing home because of coffee-ground emesis and drop in hemoglobin.  She has a PEG tube, was recently hospitalized, was on Plavix which is on hold since Friday. In April she had C. Difficile colitis which was thought to be the cause of lower GI bleeding, currently diarrhea and rectal bleeding has resolved. Patient denies abdominal pain. As per her nurse at bedside she had black stools after admission.   Past Medical History:  Diagnosis Date  . Abnormal liver function test   . Cerebral infarct ([place])   . Cognitive communication deficit   . Difficulty walking   . Dysphagia   . Femoral neck fracture ([place]) 03/2017   left   . Gastrostomy status ([place])   . GERD (gastroesophageal reflux disease)   . Hemiparesis ([place])   . Hemiplegia ([place])   . Hyperlipidemia   . Hypertensive emergency 03/2017  . Hypertensive heart disease   . Hypokalemia 03/2017  . Muscle weakness   . Other hyperlipidemia   . Pain in left hip   . Presence of artificial hip, left   . Respiratory symptoms   . Severe protein-calorie malnutrition ([place])   . Stroke [facility])     Past Surgical History:  Procedure Laterality Date  . ANTERIOR APPROACH HEMI HIP ARTHROPLASTY Left 03/25/2017   Procedure: ANTERIOR APPROACH HEMI HIP ARTHROPLASTY;  Surgeon: Rod Can, MD;  Location: [place];  Service: Orthopedics;  Laterality: Left;  . IR GASTROSTOMY TUBE MOD SED  03/31/2017    Prior to Admission medications   Medication Sig Start Date End Date Taking? Authorizing Provider  acetaminophen (TYLENOL) 325 MG tablet Place 650 mg into feeding tube every 6 (six) hours as needed for mild pain  or moderate pain. Notify MD if not relieved. Special Requirement - Pre-Pain (Physician Order) Not to exceed 3000mg  in 24 hour period    Yes [provider]  Amino Acids-Protein Hydrolys (FEEDING SUPPLEMENT, PRO-STAT SUGAR FREE 64,) LIQD Place 30 mLs into feeding tube 2 (two) times daily.    Yes [provider]  amLODipine (NORVASC) 5 MG tablet Place 5 mg into feeding tube daily.   Yes [provider]  atorvastatin (LIPITOR) 40 MG tablet Place 1 tablet (40 mg total) into feeding tube daily at 6 PM. 04/02/17  Yes Starla Link, Kshitiz, MD  bisacodyl (DULCOLAX) 10 MG suppository Place 1 suppository (10 mg total) rectally daily as needed for moderate constipation. 06/04/17  Yes Kayleen Memos, DO  carboxymethylcellulose (REFRESH PLUS) 0.5 % SOLN Place 1 drop into both eyes every 8 (eight) hours as needed (dry eyes).   Yes [provider]  carvedilol (COREG) 6.25 MG tablet Place 6.25 mg into feeding tube 2 (two) times daily with a meal. HYPERTENSIVE HEART DISEASE WITH HEART FAILURE   Yes [provider]  cetirizine (ZYRTEC) 10 MG tablet Place 10 mg into feeding tube daily.   Yes [provider]  cholestyramine (QUESTRAN) 4 g packet Place 4 g into feeding tube 2 (two) times daily. Mixed with 4 ounces of water   Yes [provider]  magnesium hydroxide (MILK OF MAGNESIA) 400 MG/5ML suspension Place 30 mLs into feeding tube daily as needed for mild constipation.  Yes [provider]  nitroGLYCERIN (NITROSTAT) 0.4 MG SL tablet Place 1 tablet (0.4 mg total) under the tongue every 5 (five) minutes as needed for chest pain. 04/02/17  Yes Aline August, MD  Nutritional Supplements (FEEDING SUPPLEMENT, OSMOLITE 1.5 CAL,) LIQD Place 1,000 mLs into feeding tube 4 (four) times daily. 1425 Kcal and 66g Pro daily ( May cont Osmolite 1.2 until Osmolite 1.5 available)   Yes [provider]  pantoprazole (PROTONIX) 40 MG tablet Take 40 mg by mouth daily.    Yes [provider]  ranitidine (ZANTAC) 150 MG/10ML syrup Place 10 mLs (150 mg total) into feeding tube daily. 06/05/17  Yes Hall, Carole N, DO  saccharomyces boulardii (FLORASTOR) 250 MG capsule 250 mg. GIVE 1 CAPSULE VIA PEG TUBE DAILY AS SUPPLEMENT    Yes [provider]  Sodium Phosphates (RA SALINE ENEMA RE) Place 1 application rectally as needed (constipation).   Yes [provider]  traMADol (ULTRAM) 50 MG tablet Take 50 mg by mouth 3 (three) times daily.   Yes [provider]  Water For Irrigation, Sterile (FREE WATER) SOLN Place 200 mLs into feeding tube every 6 (six) hours. 07/11/17  Yes Regalado, Belkys A, MD  clopidogrel (PLAVIX) 75 MG tablet Place 1 tablet (75 mg total) into feeding tube daily. 04/26/17   Bonnell Public, MD  famotidine (PEPCID) 20 MG tablet Take 1 tablet (20 mg total) by mouth daily. 07/11/17 07/11/18  Regalado, Cassie Freer, MD  [facility]) MISC by Does not apply route. Boots to bilateral feet QD- while in bed-may remove for AM/PM care 7 am on/off 3 pm on/off 11 pm on/off    [provider]    Current Facility-Administered Medications  Medication Dose Route Frequency Provider Last Rate Last Dose  . 0.9 %  sodium chloride infusion  250 mL Intravenous PRN Derrill Kay A, MD      . 0.9 %  sodium chloride infusion   Intravenous Once Derrill Kay A, MD      . 0.9 %  sodium chloride infusion   Intravenous Continuous Debbe Odea, MD 100 mL/hr at 07/19/17 1123    . 0.9 %  sodium chloride infusion   Intravenous Continuous Ronnette Juniper, MD      . acetaminophen (TYLENOL) tablet 650 mg  650 mg Oral Q6H PRN Gardiner Barefoot, NP      . amLODipine (NORVASC) tablet 5 mg  5 mg Per Tube Daily Derrill Kay A, MD   5 mg at 07/19/17 1126  . atorvastatin (LIPITOR) tablet 40 mg  40 mg Per Tube q1800 Phillips Grout, MD   40 mg at 07/19/17 0002  . carvedilol (COREG) tablet 6.25 mg  6.25 mg Per Tube BID WC Derrill Kay  A, MD   6.25 mg at 07/19/17 0826  . cholestyramine (QUESTRAN) packet 4 g  4 g Per Tube BID Phillips Grout, MD   4 g at 07/19/17 1302  . ondansetron (ZOFRAN) tablet 4 mg  4 mg Oral Q6H PRN Phillips Grout, MD       Or  . ondansetron (ZOFRAN) injection 4 mg  4 mg Intravenous Q6H PRN Derrill Kay A, MD      . pantoprazole sodium (PROTONIX) 40 mg/20 mL oral suspension 40 mg  40 mg Per Tube BID Debbe Odea, MD   40 mg at 07/19/17 1159  . potassium chloride 20 MEQ/15ML (10%) solution 40 mEq  40 mEq Oral Q4H Debbe Odea, MD  40 mEq at 07/19/17 1126  . sodium chloride flush (NS) 0.9 % injection 3 mL  3 mL Intravenous Q12H Derrill Kay A, MD   3 mL at 07/19/17 1127  . sodium chloride flush (NS) 0.9 % injection 3 mL  3 mL Intravenous PRN Phillips Grout, MD      . traMADol Veatrice Bourbon) tablet 50 mg  50 mg Per Tube TID PRN Debbe Odea, MD   50 mg at 07/19/17 0826    Allergies as of 07/18/2017  . (No Known Allergies)    Family History  Problem Relation Age of Onset  . CAD Mother   . Heart disease Mother     Social History   Socioeconomic History  . Marital status: Widowed    Spouse name: Not on file  . Number of children: Not on file  . Years of education: Not on file  . Highest education level: Not on file  Occupational History  . Not on file  Social Needs  . Financial resource strain: Not on file  . Food insecurity:    Worry: Not on file    Inability: Not on file  . Transportation needs:    Medical: Not on file    Non-medical: Not on file  Tobacco Use  . Smoking status: Never Smoker  . Smokeless tobacco: Never Used  Substance and Sexual Activity  . Alcohol use: No    Frequency: Never  . Drug use: No  . Sexual activity: Not on file  Lifestyle  . Physical activity:    Days per week: Not on file    Minutes per session: Not on file  . Stress: Not on file  Relationships  . Social connections:    Talks on phone: Not on file    Gets together: Not on file    Attends  religious service: Not on file    Active member of club or organization: Not on file    Attends meetings of clubs or organizations: Not on file    Relationship status: Not on file  . Intimate partner violence:    Fear of current or ex partner: Not on file    Emotionally abused: Not on file    Physically abused: Not on file    Forced sexual activity: Not on file  Other Topics Concern  . Not on file  Social History Narrative   Widowed   + children   No EtOH, tobacco    Review of Systems: Positive for: GI: Described in detail in HPI.    Gen: Denies any fever, chills, rigors, night sweats, anorexia, fatigue, weakness, malaise, involuntary weight loss, and sleep disorder CV: Denies chest pain, angina, palpitations, syncope, orthopnea, PND, peripheral edema, and claudication. Resp: Denies dyspnea, cough, sputum, wheezing, coughing up blood. GU : Denies urinary burning, blood in urine, urinary frequency, urinary hesitancy, nocturnal urination, and urinary incontinence. MS: inability to move her right upper and lower extremity  Derm: Denies rash, itching, oral ulcerations, hives, unhealing ulcers.  Psych: Denies depression, anxiety, memory loss, suicidal ideation, hallucinations,  and confusion. Heme: Denies bruising and enlarged lymph nodes. Neuro:  Denies any headaches, dizziness, paresthesias. Endo:  Denies any problems with DM, thyroid, adrenal function.  Physical Exam: Vital signs in last 24 hours: Temp:  [97.3 F (36.3 C)-98.4 F (36.9 C)] 97.3 F (36.3 C) (06/18 1418) Pulse Rate:  [52-98] 80 (06/18 1418) Resp:  [11-29] 17 (06/18 1418) BP: (115-147)/(69-94) 127/80 (06/18 1418) SpO2:  [97 %-100 %] 100 % (06/18  1418) Weight:  [50.7 kg (111 lb 12.4 oz)] 50.7 kg (111 lb 12.4 oz) (06/17 1801) Last BM Date: 07/19/17  General:   Alert,  Well-developed, well-nourished, pleasant and cooperative in NAD Head:  Normocephalic and atraumatic. Eyes:  Sclera clear, no icterus.   Mild  pallor. Ears:  Normal auditory acuity. Nose:  No deformity, discharge,  or lesions. Mouth:  No deformity or lesions.  Oropharynx pink & moist. Neck:  Supple; no masses or thyromegaly. Lungs:  Clear throughout to auscultation.   No wheezes, crackles, or rhonchi. No acute distress. Heart:  Regular rate and rhythm; no murmurs, clicks, rubs,  or gallops. Extremities:  Without clubbing or edema. Neurologic:  Alert and  oriented ;  Communicates with the help of alphabets and numbers,Dense right-sided hemiparesis Skin:  Intact without significant lesions or rashes. Psych:  Alert and cooperative. Normal mood and affect. Abdomen:  Soft, mildly distended and tympanitic on percussion, gastrostomy tube noted, minimal discharge and granulation tissue  around PEG insertion site       Lab Results: Recent Labs    07/18/17 1627 07/19/17 0936  WBC 7.7 7.5  HGB 7.0* 10.8*  HCT 22.7* 33.3*  PLT 488* 430*   BMET Recent Labs    07/18/17 1627 07/19/17 0936  NA 145 146*  K 3.2* 3.3*  CL 113* 115*  CO2 25 23  GLUCOSE 119* 89  BUN 39* 31*  CREATININE 0.67 0.72  CALCIUM 8.3* 8.6*   LFT Recent Labs    07/18/17 1627  PROT 5.7*  ALBUMIN 2.3*  AST 36  ALT 35  ALKPHOS 67  BILITOT 0.3   PT/INR No results for input(s): LABPROT, INR in the last 72 hours.  Studies/Results: No results found.  Impression: Coffee-ground emesis, normocytic anemia, hemoglobin 7 on admission, 10.8 posttransfusion, elevated BUN/creatinine ratio of 39/0.67, suspicious for an upper GI bleeding.  History of stroke, hemiparesis, aphasia  Plan: Diagnostic EGD in a.m.. Receiving Protonix 40 mg by PEG tube. Remains hemodynamically stable,has received 2 units of PRBC transfusion.  Discussed about the risk and benefits of the procedure with the patient and her son over the phone. They understand and agree.   LOS: 1 day   Ronnette Juniper, MD  07/19/2017, 4:00 PM  Pager [phone] If no answer or after 5 PM call  [phone]

## 2017-07-19 NOTE — Progress Notes (Signed)
PROGRESS NOTE    Alyssa Savage   OLM:[id number]  DOB: 1931-01-03  DOA: 07/18/2017 PCP: Hennie Duos, MD   Brief Narrative:  Alyssa Savage 84 y.o. female with hypertension, hyperlipidemia, status post stroke with aphasia and right-sided hemiparesis was hospitalized a couple weeks ago for gross hematemesis and discharged home on a PPI. Prior to this, she was hospitalized for C diff colitis.  She is sent from the nursing home for hematemesis and Hb of 7.   Subjective: Aphasic- not vomiting per RN. Not in pain.     Assessment & Plan:   Principal Problem:   GIB (gastrointestinal bleeding)/   GERD (gastroesophageal reflux disease) - BID PPI- Plavix on hold- Dr Carlean Purl consulted - NPO- NS infusion   Active Problems:  Anemia due to acute blood loss - 2 U PRBC given for Hb of 7.0- repeat Hb is 10.8  Hypokalemia - replace and recheck tomorrow   HTN - BP high despite bleeding- cont low dose Coreg with holding parameters    Hemiparesis  /   Aphasia - does not ambulate and is in SNF    Normocytic anemia    DVT prophylaxis: SCDs Code Status: Full code Family Communication:  Disposition Plan: f/u for bleeding Consultants:   GI Procedures:   none Antimicrobials:  Anti-infectives (From admission, onward)   None       Objective: Vitals:   07/19/17 0012 07/19/17 0356 07/19/17 0421 07/19/17 0728  BP: 140/86 134/88 137/87 (!) 147/94  Pulse: 96 96 89 92  Resp: 18 18 18 20   Temp: 97.7 F (36.5 C) 97.9 F (36.6 C) 98 F (36.7 C) 97.7 F (36.5 C)  TempSrc: Oral Oral Oral Oral  SpO2: 99% 100% 100% 99%  Weight:        Intake/Output Summary (Last 24 hours) at 07/19/2017 1046 Last data filed at 07/19/2017 0100 Gross per 24 hour  Intake 1002 ml  Output -  Net 1002 ml   Filed Weights   07/18/17 1801  Weight: 50.7 kg (111 lb 12.4 oz)    Examination: General exam: Appears comfortable - non-verbal but communicates with hands and a word chart in front of  her HEENT: PERRLA, oral mucosa moist, no sclera icterus or thrush Respiratory system: Clear to auscultation. Respiratory effort normal. Cardiovascular system: S1 & S2 heard, RRR.   Gastrointestinal system: Abdomen soft, non-tender, nondistended. Normal bowel sound. No organomegaly Extremities: No cyanosis, clubbing or edema Skin: No rashes or ulcers Psychiatry:  Mood & affect appropriate.     Data Reviewed: I have personally reviewed following labs and imaging studies  CBC: Recent Labs  Lab 07/18/17 1627 07/19/17 0936  WBC 7.7 7.5  NEUTROABS 4.9  --   HGB 7.0* 10.8*  HCT 22.7* 33.3*  MCV 88.7 86.9  PLT 488* 712*   Basic Metabolic Panel: Recent Labs  Lab 07/18/17 1627 07/19/17 0936  NA 145 146*  K 3.2* 3.3*  CL 113* 115*  CO2 25 23  GLUCOSE 119* 89  BUN 39* 31*  CREATININE 0.67 0.72  CALCIUM 8.3* 8.6*   GFR: Estimated Creatinine Clearance: 41.9 mL/min (by C-G formula based on SCr of 0.72 mg/dL). Liver Function Tests: Recent Labs  Lab 07/18/17 1627  AST 36  ALT 35  ALKPHOS 67  BILITOT 0.3  PROT 5.7*  ALBUMIN 2.3*   No results for input(s): LIPASE, AMYLASE in the last 168 hours. No results for input(s): AMMONIA in the last 168 hours. Coagulation Profile: No results for input(s): INR, PROTIME  in the last 168 hours. Cardiac Enzymes: No results for input(s): CKTOTAL, CKMB, CKMBINDEX, TROPONINI in the last 168 hours. BNP (last 3 results) No results for input(s): PROBNP in the last 8760 hours. HbA1C: No results for input(s): HGBA1C in the last 72 hours. CBG: No results for input(s): GLUCAP in the last 168 hours. Lipid Profile: No results for input(s): CHOL, HDL, LDLCALC, TRIG, CHOLHDL, LDLDIRECT in the last 72 hours. Thyroid Function Tests: No results for input(s): TSH, T4TOTAL, FREET4, T3FREE, THYROIDAB in the last 72 hours. Anemia Panel: Recent Labs    07/18/17 1624  VITAMINB12 776  FOLATE 30.2  FERRITIN 21  TIBC 300  IRON 17*  RETICCTPCT 8.5*    Urine analysis:    Component Value Date/Time   COLORURINE YELLOW 07/09/2017 1545   APPEARANCEUR HAZY (A) 07/09/2017 1545   LABSPEC 1.016 07/09/2017 1545   PHURINE 5.0 07/09/2017 1545   GLUCOSEU NEGATIVE 07/09/2017 1545   HGBUR NEGATIVE 07/09/2017 1545   BILIRUBINUR NEGATIVE 07/09/2017 1545   KETONESUR NEGATIVE 07/09/2017 1545   PROTEINUR NEGATIVE 07/09/2017 1545   NITRITE NEGATIVE 07/09/2017 1545   LEUKOCYTESUR NEGATIVE 07/09/2017 1545   Sepsis Labs: @LABRCNTIP (procalcitonin:4,lacticidven:4) ) Recent Results (from the past 240 hour(s))  Culture, Urine     Status: Abnormal   Collection Time: 07/09/17  4:18 PM  Result Value Ref Range Status   Specimen Description URINE, RANDOM  Final   Special Requests   Final    NONE Performed at [facility], [place] [address] [place], [place] [address]    Culture MULTIPLE SPECIES PRESENT, SUGGEST RECOLLECTION (A)  Final   Report Status 07/10/2017 FINAL  Final  MRSA PCR Screening     Status: None   Collection Time: 07/09/17  6:56 PM  Result Value Ref Range Status   MRSA by PCR NEGATIVE NEGATIVE Final    Comment:        The GeneXpert MRSA Assay (FDA approved for NASAL specimens only), is one component of a comprehensive MRSA colonization surveillance program. It is not intended to diagnose MRSA infection nor to guide or monitor treatment for MRSA infections. Performed at [facility], [place] [address] [place], [place] [address]          Radiology Studies: No results found.    Scheduled Meds: . amLODipine  5 mg Per Tube Daily  . atorvastatin  40 mg Per Tube q1800  . carvedilol  6.25 mg Per Tube BID WC  . cholestyramine  4 g Per Tube BID  . pantoprazole sodium  40 mg Per Tube Daily  . sodium chloride flush  3 mL Intravenous Q12H   Continuous Infusions: . sodium chloride    . sodium chloride       LOS: 1 day    Time spent in minutes: 35    Debbe Odea, MD [facility] Pager: [email or web address] Password [facility] 07/19/2017, 10:46 AM

## 2017-07-19 NOTE — Progress Notes (Signed)
Initial Nutrition Assessment  DOCUMENTATION CODES:   Severe malnutrition in context of chronic illness  INTERVENTION:   Once desired provide: Bolus feedings of 237 ml Osmolite 1.5 QID via PEG 30 ml Prostat BID.    Tube feeding regimen provides 1622 kcal (100% of needs), 89 grams of protein, and 722 ml of H2O. Total free water: 1522 ml  NUTRITION DIAGNOSIS:   Severe Malnutrition related to chronic illness(CVA) as evidenced by moderate fat depletion, severe muscle depletion, moderate muscle depletion, severe fat depletion.  GOAL:   Patient will meet greater than or equal to 90% of their needs  MONITOR:   Diet advancement, Weight trends, Labs, I & O's, TF tolerance  REASON FOR ASSESSMENT:   Malnutrition Screening Tool   ASSESSMENT:   Patient with PMH significant for CVA with right side hemiplegia, severe dysphagia s/p PEG tube, and HTN. Recently admitted 6/10 with SOB and hematemesis. Presents this admission with gastrointestinal bleeding with acute blood loss.    Pt non engaging during time of assessment. No family at bedside to provide further information. Pt is currently NPO. Will begin tube feeding once diet is advanced per her SNF regimen.    Per [facility] records where pt resides she receives TF regimen of 237 ml Osmolite 1.5 QID with 30 ml Prostat AWC BID, which provides 1622 kcals, 93 grams protein, and 722 ml free water daily (meeting 100% of estimated nutritional needs).   Unable to obtain weight history. Records indicate pt weighed 107 lb 06/29/17 and 111 lb this admission. Nutrition-Focused physical exam completed.  Medications reviewed.  Labs reviewed: Na 146 (H) K 3.3 (L)   NUTRITION - FOCUSED PHYSICAL EXAM:    Most Recent Value  Orbital Region  Moderate depletion  Upper Arm Region  Severe depletion  Thoracic and Lumbar Region  Unable to assess  Buccal Region  Moderate depletion  Temple Region  Severe depletion  Clavicle Bone Region  Severe depletion   Clavicle and Acromion Bone Region  Severe depletion  Scapular Bone Region  Unable to assess  Dorsal Hand  Moderate depletion  Patellar Region  Moderate depletion  Anterior Thigh Region  Moderate depletion  Posterior Calf Region  Severe depletion  Edema (RD Assessment)  None  Hair  Reviewed  Eyes  Reviewed  Mouth  Reviewed  Skin  Reviewed  Nails  Reviewed     Diet Order:   Diet Order           Diet NPO time specified Except for: Sips with Meds  Diet effective now          EDUCATION NEEDS:   Not appropriate for education at this time  Skin:  Skin Integrity Issues:: Stage III Stage III: rt sacrum  Last BM:  07/19/17  Height:   Ht Readings from Last 1 Encounters:  07/12/17 5\' 3"  (1.6 m)    Weight:   Wt Readings from Last 1 Encounters:  07/18/17 111 lb 12.4 oz (50.7 kg)    Ideal Body Weight:  52.3 kg  BMI:  Body mass index is 19.8 kg/m.  Estimated Nutritional Needs:   Kcal:  1500-1700 kcal  Protein:  75-90 g  Fluid:  >1.5 L/day   Mariana Single RD, LDN Clinical Nutrition Pager # [phone]

## 2017-07-20 ENCOUNTER — Encounter (HOSPITAL_COMMUNITY): Payer: Self-pay | Admitting: *Deleted

## 2017-07-20 ENCOUNTER — Inpatient Hospital Stay (HOSPITAL_COMMUNITY): Payer: Medicare Other | Admitting: Anesthesiology

## 2017-07-20 ENCOUNTER — Encounter (HOSPITAL_COMMUNITY)
Admission: EM | Disposition: A | Discharge status: Skilled Nursing Facility | Payer: Self-pay | Source: Skilled Nursing Facility | Attending: Internal Medicine

## 2017-07-20 HISTORY — PX: ESOPHAGOGASTRODUODENOSCOPY (EGD) WITH PROPOFOL: SHX5813

## 2017-07-20 LAB — CBC
HCT: 33.5 % — ABNORMAL LOW (ref 36.0–46.0)
HEMOGLOBIN: 10.6 g/dL — AB (ref 12.0–15.0)
MCH: 27.8 pg (ref 26.0–34.0)
MCHC: 31.6 g/dL (ref 30.0–36.0)
MCV: 87.9 fL (ref 78.0–100.0)
Platelets: 456 10*3/uL — ABNORMAL HIGH (ref 150–400)
RBC: 3.81 MIL/uL — ABNORMAL LOW (ref 3.87–5.11)
RDW: 16.1 % — ABNORMAL HIGH (ref 11.5–15.5)
WBC: 7.6 10*3/uL (ref 4.0–10.5)

## 2017-07-20 LAB — BPAM RBC
BLOOD PRODUCT EXPIRATION DATE: 201907162359
Blood Product Expiration Date: 201907082359
ISSUE DATE / TIME: 201906172352
ISSUE DATE / TIME: 201906180402
UNIT TYPE AND RH: 7300
Unit Type and Rh: 7300

## 2017-07-20 LAB — TYPE AND SCREEN
ABO/RH(D): B POS
Antibody Screen: NEGATIVE
UNIT DIVISION: 0
Unit division: 0

## 2017-07-20 LAB — BASIC METABOLIC PANEL
ANION GAP: 6 (ref 5–15)
BUN: 21 mg/dL — ABNORMAL HIGH (ref 6–20)
CALCIUM: 8.6 mg/dL — AB (ref 8.9–10.3)
CO2: 24 mmol/L (ref 22–32)
Chloride: 115 mmol/L — ABNORMAL HIGH (ref 101–111)
Creatinine, Ser: 0.7 mg/dL (ref 0.44–1.00)
GFR calc Af Amer: >60 mL/min (ref >60)
GLUCOSE: 94 mg/dL (ref 65–99)
Potassium: 3.5 mmol/L (ref 3.5–5.1)
SODIUM: 145 mmol/L (ref 135–145)

## 2017-07-20 LAB — C DIFFICILE QUICK SCREEN W PCR REFLEX
C DIFFICILE (CDIFF) TOXIN: NEGATIVE
C Diff antigen: POSITIVE — AB

## 2017-07-20 SURGERY — ESOPHAGOGASTRODUODENOSCOPY (EGD) WITH PROPOFOL
Anesthesia: Monitor Anesthesia Care

## 2017-07-20 MED ORDER — PROPOFOL 500 MG/50ML IV EMUL
INTRAVENOUS | Status: DC | PRN
  Administered 2017-07-20: 20 mg via INTRAVENOUS

## 2017-07-20 MED ORDER — OSMOLITE 1.5 CAL PO LIQD
237.0000 mL | Freq: Four times a day (QID) | ORAL | Status: DC
  Administered 2017-07-20 – 2017-07-21 (×3): 237 mL
  Filled 2017-07-20 (×6): qty 237

## 2017-07-20 MED ORDER — POTASSIUM CHLORIDE 20 MEQ/15ML (10%) PO SOLN
40.0000 meq | Freq: Once | ORAL | Status: AC
  Administered 2017-07-20: 40 meq via ORAL
  Filled 2017-07-20: qty 30

## 2017-07-20 MED ORDER — PROPOFOL 500 MG/50ML IV EMUL
INTRAVENOUS | Status: DC | PRN
  Administered 2017-07-20: 50 ug/kg/min via INTRAVENOUS

## 2017-07-20 MED ORDER — DICLOFENAC SODIUM 1 % TD GEL
2.0000 g | Freq: Four times a day (QID) | TRANSDERMAL | Status: DC
  Administered 2017-07-20 – 2017-07-21 (×3): 2 g via TOPICAL
  Filled 2017-07-20: qty 100

## 2017-07-20 MED ORDER — PRO-STAT SUGAR FREE PO LIQD
30.0000 mL | Freq: Two times a day (BID) | ORAL | Status: DC
  Administered 2017-07-20 – 2017-07-21 (×2): 30 mL via ORAL
  Filled 2017-07-20 (×2): qty 30

## 2017-07-20 MED ORDER — PROPOFOL 10 MG/ML IV BOLUS
INTRAVENOUS | Status: AC
  Filled 2017-07-20: qty 40

## 2017-07-20 SURGICAL SUPPLY — 14 items

## 2017-07-20 NOTE — Op Note (Signed)
EGD was performed for acute posthemorrhagic anemia and hematemesis.   Finding: LA grade D esophagitis noted from 20-34 cm from incisors.Few superficial esophageal ulcers noted from 30-34 cm, largest 8 mm in dimension,mucosa appeared friable and was bleeding easily on contact, biopsies not taken. 8 cm hiatal hernia. Multiple polyps noted in the gastric body, likely fundic gland polyps. Intact gastrostomy site with patent G-tube and gastric body, underlying mucosa appeared unremarkable. Normal-appearing duodenal bulb and duodenum. Normal cardia and fundus on retroflexion.   Recommendation: PPI twice a day via PEG tube for 8 weeks, thereafter indefinitely once a day if Plavix is continued. Resume PEG feedings. Resume Plavix in 3 days if needed, avoid NSAIDs and  other antiplatelets.  Ronnette Juniper, M.D.

## 2017-07-20 NOTE — Brief Op Note (Signed)
07/18/2017 - 07/20/2017  1:12 PM  PATIENT:  Alyssa Savage  84 y.o. female  PRE-OPERATIVE DIAGNOSIS:  coffee ground emesis, anemia  POST-OPERATIVE DIAGNOSIS:  bleeding esophagitis, esophageal ulcers, , 8 cm hiatal hernia, gastric polyps,   PROCEDURE:  Procedure(s): ESOPHAGOGASTRODUODENOSCOPY (EGD) WITH PROPOFOL (N/A)  SURGEON:  Surgeon(s) and Role:    Ronnette Juniper, MD - Primary  PHYSICIAN ASSISTANT:   ASSISTANTS:Jennifer Kappus, RN, Malissa Hippo ANESTHESIA:   MAC  EBL:  Minimal  BLOOD ADMINISTERED:none  DRAINS: none   LOCAL MEDICATIONS USED:  NONE  SPECIMEN:  No Specimen  DISPOSITION OF SPECIMEN:  N/A  COUNTS:  YES  TOURNIQUET:  * No tourniquets in log *  DICTATION: .Dragon Dictation  PLAN OF CARE: Admit to inpatient   PATIENT DISPOSITION:  PACU - hemodynamically stable.   Delay start of Pharmacological VTE agent (>24hrs) due to surgical blood loss or risk of bleeding: yes

## 2017-07-20 NOTE — Op Note (Signed)
[facility] Patient Name: Alyssa Savage Procedure Date: 07/20/2017 MRN: [id number] Attending MD: Ronnette Juniper , MD Date of Birth: 10/27/1930 CSN: [id number] Age: 84 Admit Type: Inpatient Procedure:                Upper GI endoscopy Indications:              Acute post hemorrhagic anemia, Hematemesis Providers:                Ronnette Juniper, MD, Kingsley Plan, RN, William Dalton, Technician Referring MD:              Medicines:                Monitored Anesthesia Care Complications:            No immediate complications. Estimated blood loss:                            Minimal. Estimated Blood Loss:     Estimated blood loss was minimal. Procedure:                Pre-Anesthesia Assessment:                           - Prior to the procedure, a History and Physical                            was performed, and patient medications and                            allergies were reviewed. The patient's tolerance of                            previous anesthesia was also reviewed. The risks                            and benefits of the procedure and the sedation                            options and risks were discussed with the patient.                            All questions were answered, and informed consent                            was obtained. Prior Anticoagulants: The patient has                            taken Plavix (clopidogrel), last dose was 5 days                            prior to procedure. ASA Grade Assessment: III - A  patient with severe systemic disease. After                            reviewing the risks and benefits, the patient was                            deemed in satisfactory condition to undergo the                            procedure.                           After obtaining informed consent, the endoscope was                            passed under direct vision. Throughout the                        procedure, the patient's blood pressure, pulse, and                            oxygen saturations were monitored continuously. The                            [id number] ) was introduced through the                            mouth, and advanced to the second part of duodenum.                            The upper GI endoscopy was accomplished without                            difficulty. The patient tolerated the procedure                            well. Scope In: Scope Out: Findings:      LA Grade D (one or more mucosal breaks involving at least 75% of       esophageal circumference) esophagitis with bleeding was found 20 to 34       cm from the incisors.      Few superficial esophageal ulcers with no bleeding and no stigmata of       recent bleeding were found 30 to 34 cm from the incisors. The largest       lesion was 8 mm in largest dimension. Biopsies were not taken as the       mucosa appeared friable and was bleeding easily on contact.      An 8 cm hiatal hernia was present.      Multiple small sessile polyps with no bleeding and no stigmata of recent       bleeding were found in the gastric body.      There was evidence of an intact gastrostomy with a patent G-tube present       in the gastric body. This was characterized by healthy appearing mucosa.      The cardia and  gastric fundus were normal on retroflexion.      The examined duodenum was normal. Impression:               - LA Grade D esophagitis.                           - Non-bleeding esophageal ulcers, but friable and                            easy oozing on contact.                           - 8 cm hiatal hernia.                           - Multiple gastric polyps.                           - Intact gastrostomy with a patent G-tube present                            characterized by healthy appearing mucosa.                           - Normal examined duodenum.                            - No specimens collected. Moderate Sedation:      Patient did not receive moderate sedation for this procedure, but       instead received monitored anesthesia care. Recommendation:           - Use Protonix (pantoprazole) 40 mg via peg tube                            BID for 8 weeks.                           - PEG feedings as before.                           - Resume Plavix (clopidogrel) at prior dose in 3                            days if needed. Procedure Code(s):        --- Professional ---                           [phone], Esophagogastroduodenoscopy, flexible,                            transoral; diagnostic, including collection of                            specimen(s) by brushing or washing, when performed                            (separate procedure) Diagnosis  Code(s):        --- Professional ---                           K20.9, Esophagitis, unspecified                           K22.10, Ulcer of esophagus without bleeding                           K44.9, Diaphragmatic hernia without obstruction or                            gangrene                           K31.7, Polyp of stomach and duodenum                           Z93.1, Gastrostomy status                           D62, Acute posthemorrhagic anemia                           K92.0, Hematemesis CPT copyright 2017 American Medical Association. All rights reserved. The codes documented in this report are preliminary and upon coder review may  be revised to meet current compliance requirements. Ronnette Juniper, MD 07/20/2017 1:11:36 PM This report has been signed electronically. Number of Addenda: 0

## 2017-07-20 NOTE — Consult Note (Signed)
[place] Nurse wound consult note Reason for Consult: Stage 3 pressure injury to sacrum, present on admission.  History of C diff colitis and hemiparesis. Heels and other bony prominences are moist and supple and intact.  High risk for breakdown due to incontinence and limited bed mobility.  Encourage turn and reposition every two hours.  Wound type: stage 3 pressure injury Pressure Injury POA: Yes Measurement: 0.3 cm x 1 cm x 0.3 cm  Wound bed: pink and moist Drainage (amount, consistency, odor) minimal serosanguinous Periwound:intact Dressing procedure/placement/frequency: Cleanse sacral wound with soap and water. Apply Allevyn sacral foam dressing.  Change every three days and PRN soilage. Turn and reposition every two hours.  Will not follow at this time.  Please re-consult if needed.  Domenic Moras RN BSN [place] Pager [phone]

## 2017-07-20 NOTE — Anesthesia Postprocedure Evaluation (Signed)
Anesthesia Post Note  Patient: Alyssa Savage  Procedure(s) Performed: ESOPHAGOGASTRODUODENOSCOPY (EGD) WITH PROPOFOL (N/A )     Patient location during evaluation: PACU Anesthesia Type: MAC Level of consciousness: awake and alert Pain management: pain level controlled Vital Signs Assessment: post-procedure vital signs reviewed and stable Respiratory status: spontaneous breathing, nonlabored ventilation, respiratory function stable and patient connected to nasal cannula oxygen Cardiovascular status: stable and blood pressure returned to baseline Postop Assessment: no apparent nausea or vomiting Anesthetic complications: no    Last Vitals:  Vitals:   07/20/17 1350 07/20/17 1426  BP: (!) 152/92 (!) 147/82  Pulse: 92 90  Resp: (!) 24   Temp:  36.6 C  SpO2: 91% 95%    Last Pain:  Vitals:   07/20/17 1426  TempSrc: Oral  PainSc:                  Alyssa Savage

## 2017-07-20 NOTE — Anesthesia Preprocedure Evaluation (Addendum)
Anesthesia Evaluation  Patient identified by MRN, date of birth, ID band Patient awake    Reviewed: Allergy & Precautions, NPO status , Patient's Chart, lab work & pertinent test results  Airway Mallampati: I  TM Distance: >3 FB Neck ROM: Full    Dental   Pulmonary    Pulmonary exam normal        Cardiovascular hypertension, Pt. on medications Normal cardiovascular exam     Neuro/Psych CVA, Residual Symptoms    GI/Hepatic GERD  Medicated and Controlled,  Endo/Other    Renal/GU      Musculoskeletal   Abdominal   Peds  Hematology   Anesthesia Other Findings   Reproductive/Obstetrics                             Anesthesia Physical Anesthesia Plan  ASA: III  Anesthesia Plan: MAC   Post-op Pain Management:    Induction:   PONV Risk Score and Plan: 2 and Treatment may vary due to age or medical condition  Airway Management Planned: Simple Face Mask  Additional Equipment:   Intra-op Plan:   Post-operative Plan: Extubation in OR  Informed Consent: I have reviewed the patients History and Physical, chart, labs and discussed the procedure including the risks, benefits and alternatives for the proposed anesthesia with the patient or authorized representative who has indicated his/her understanding and acceptance.     Plan Discussed with: CRNA and Surgeon  Anesthesia Plan Comments:         Anesthesia Quick Evaluation

## 2017-07-20 NOTE — Transfer of Care (Signed)
Immediate Anesthesia Transfer of Care Note  Patient: Alyssa Savage  Procedure(s) Performed: ESOPHAGOGASTRODUODENOSCOPY (EGD) WITH PROPOFOL (N/A )  Patient Location: PACU  Anesthesia Type:MAC  Level of Consciousness: awake, alert  and oriented  Airway & Oxygen Therapy: Patient Spontanous Breathing and Patient connected to nasal cannula oxygen  Post-op Assessment: Report given to RN and Post -op Vital signs reviewed and stable  Post vital signs: Reviewed and stable  Last Vitals:  Vitals Value Taken Time  BP 166/99 07/20/2017  1:15 PM  Temp    Pulse 94 07/20/2017  1:15 PM  Resp    SpO2 96 % 07/20/2017  1:15 PM  Vitals shown include unvalidated device data.  Last Pain:  Vitals:   07/20/17 1240  TempSrc: Oral  PainSc:          Complications: No apparent anesthesia complications

## 2017-07-20 NOTE — Progress Notes (Signed)
PROGRESS NOTE    Alyssa Savage   PPJ:[id number]  DOB: [birth year]  DOA: 07/18/2017 PCP: Hennie Duos, MD   Brief Narrative:  Alyssa Savage 84 y.o. female with hypertension, hyperlipidemia, status post stroke with aphasia and right-sided hemiparesis was hospitalized a couple weeks ago for gross hematemesis and discharged home on a PPI. Prior to this, she was hospitalized for C diff colitis.  She is sent from the nursing home for hematemesis and Hb of 7.   Subjective: Aphasic- points to right knee and communicates that it is sore. No other complaints.     Assessment & Plan:   Principal Problem:   GIB (gastrointestinal bleeding)/   GERD (gastroesophageal reflux disease) - BID PPI- Plavix on hold- Dr Carlean Purl consulted - NPO- NS infusion  - will have EGD at 2 PM today  Active Problems:  Anemia due to acute blood loss - 2 U PRBC given for Hb of 7.0- repeat Hb is 10.8>> 10.6  Hypokalemia - replaced - K is 3.5 today- will give 40 meq more today  Right knee discomfort - no swelling or warmth. No tenderness but she does state it is "sore"- start Voltaren gel   HTN - BP high despite bleeding- cont low dose Coreg with holding parameters    Hemiparesis  /   Aphasia - does not ambulate and is in SNF    Normocytic anemia    DVT prophylaxis: SCDs Code Status: Full code Family Communication:  Disposition Plan: f/u for bleeding Consultants:   GI Procedures:   none Antimicrobials:  Anti-infectives (From admission, onward)   None       Objective: Vitals:   07/19/17 0728 07/19/17 1418 07/19/17 2055 07/20/17 0629  BP: (!) 147/94 127/80 (!) 146/83 (!) 149/95  Pulse: 92 80 89   Resp: 20 17 20 20   Temp: 97.7 F (36.5 C) (!) 97.3 F (36.3 C) 98.4 F (36.9 C) 98.4 F (36.9 C)  TempSrc: Oral   Oral  SpO2: 99% 100% 95% 100%  Weight:        Intake/Output Summary (Last 24 hours) at 07/20/2017 0914 Last data filed at 07/20/2017 0434 Gross per 24 hour  Intake  1808.34 ml  Output 550 ml  Net 1258.34 ml   Filed Weights   07/18/17 1801  Weight: 50.7 kg (111 lb 12.4 oz)    Examination: General exam: Appears comfortable - non-verbal but communicates with hands and a word chart in front of her HEENT: PERRLA, oral mucosa moist, no sclera icterus or thrush Respiratory system: Clear to auscultation. Respiratory effort normal. Cardiovascular system: S1 & S2 heard, RRR.   Gastrointestinal system: Abdomen soft, non-tender, nondistended. Normal bowel sound. No organomegaly Extremities: No cyanosis, clubbing or edema Skin: No rashes or ulcers Psychiatry:  Mood & affect appropriate.     Data Reviewed: I have personally reviewed following labs and imaging studies  CBC: Recent Labs  Lab 07/18/17 1627 07/19/17 0936 07/20/17 0602  WBC 7.7 7.5 7.6  NEUTROABS 4.9  --   --   HGB 7.0* 10.8* 10.6*  HCT 22.7* 33.3* 33.5*  MCV 88.7 86.9 87.9  PLT 488* 430* 580*   Basic Metabolic Panel: Recent Labs  Lab 07/18/17 1627 07/19/17 0936 07/20/17 0602  NA 145 146* 145  K 3.2* 3.3* 3.5  CL 113* 115* 115*  CO2 25 23 24   GLUCOSE 119* 89 94  BUN 39* 31* 21*  CREATININE 0.67 0.72 0.70  CALCIUM 8.3* 8.6* 8.6*   GFR: Estimated Creatinine Clearance: 41.9  mL/min (by C-G formula based on SCr of 0.7 mg/dL). Liver Function Tests: Recent Labs  Lab 07/18/17 1627  AST 36  ALT 35  ALKPHOS 67  BILITOT 0.3  PROT 5.7*  ALBUMIN 2.3*   No results for input(s): LIPASE, AMYLASE in the last 168 hours. No results for input(s): AMMONIA in the last 168 hours. Coagulation Profile: No results for input(s): INR, PROTIME in the last 168 hours. Cardiac Enzymes: No results for input(s): CKTOTAL, CKMB, CKMBINDEX, TROPONINI in the last 168 hours. BNP (last 3 results) No results for input(s): PROBNP in the last 8760 hours. HbA1C: No results for input(s): HGBA1C in the last 72 hours. CBG: No results for input(s): GLUCAP in the last 168 hours. Lipid Profile: No  results for input(s): CHOL, HDL, LDLCALC, TRIG, CHOLHDL, LDLDIRECT in the last 72 hours. Thyroid Function Tests: No results for input(s): TSH, T4TOTAL, FREET4, T3FREE, THYROIDAB in the last 72 hours. Anemia Panel: Recent Labs    07/18/17 1624  VITAMINB12 776  FOLATE 30.2  FERRITIN 21  TIBC 300  IRON 17*  RETICCTPCT 8.5*   Urine analysis:    Component Value Date/Time   COLORURINE YELLOW 07/09/2017 1545   APPEARANCEUR HAZY (A) 07/09/2017 1545   LABSPEC 1.016 07/09/2017 1545   PHURINE 5.0 07/09/2017 1545   GLUCOSEU NEGATIVE 07/09/2017 1545   HGBUR NEGATIVE 07/09/2017 1545   BILIRUBINUR NEGATIVE 07/09/2017 1545   KETONESUR NEGATIVE 07/09/2017 1545   PROTEINUR NEGATIVE 07/09/2017 1545   NITRITE NEGATIVE 07/09/2017 1545   LEUKOCYTESUR NEGATIVE 07/09/2017 1545   Sepsis Labs: @LABRCNTIP (procalcitonin:4,lacticidven:4) ) No results found for this or any previous visit (from the past 240 hour(s)).       Radiology Studies: No results found.    Scheduled Meds: . amLODipine  5 mg Per Tube Daily  . atorvastatin  40 mg Per Tube q1800  . carvedilol  6.25 mg Per Tube BID WC  . cholestyramine light  4 g Oral BID  . diclofenac sodium  2 g Topical QID  . pantoprazole sodium  40 mg Per Tube BID  . potassium chloride  40 mEq Oral Once  . sodium chloride flush  3 mL Intravenous Q12H   Continuous Infusions: . sodium chloride    . sodium chloride    . sodium chloride 100 mL/hr at 07/20/17 0547  . sodium chloride       LOS: 2 days    Time spent in minutes: 35    Debbe Odea, MD [facility] Pager: [email or web address] Password [facility] 07/20/2017, 9:14 AM

## 2017-07-21 ENCOUNTER — Other Ambulatory Visit: Payer: Self-pay

## 2017-07-21 DIAGNOSIS — Z931 Gastrostomy status: Secondary | ICD-10-CM

## 2017-07-21 DIAGNOSIS — I6302 Cerebral infarction due to thrombosis of basilar artery: Secondary | ICD-10-CM

## 2017-07-21 DIAGNOSIS — D649 Anemia, unspecified: Secondary | ICD-10-CM

## 2017-07-21 DIAGNOSIS — K219 Gastro-esophageal reflux disease without esophagitis: Secondary | ICD-10-CM

## 2017-07-21 DIAGNOSIS — R4701 Aphasia: Secondary | ICD-10-CM

## 2017-07-21 DIAGNOSIS — I69351 Hemiplegia and hemiparesis following cerebral infarction affecting right dominant side: Secondary | ICD-10-CM

## 2017-07-21 DIAGNOSIS — N183 Chronic kidney disease, stage 3 (moderate): Secondary | ICD-10-CM

## 2017-07-21 DIAGNOSIS — I69391 Dysphagia following cerebral infarction: Secondary | ICD-10-CM

## 2017-07-21 DIAGNOSIS — K209 Esophagitis, unspecified: Secondary | ICD-10-CM

## 2017-07-21 DIAGNOSIS — E876 Hypokalemia: Secondary | ICD-10-CM

## 2017-07-21 DIAGNOSIS — K922 Gastrointestinal hemorrhage, unspecified: Secondary | ICD-10-CM

## 2017-07-21 LAB — CBC WITH DIFFERENTIAL/PLATELET
Basophils Absolute: 0 10*3/uL (ref 0.0–0.1)
Basophils Relative: 0 %
EOS PCT: 1 %
Eosinophils Absolute: 0.1 10*3/uL (ref 0.0–0.7)
HEMATOCRIT: 36.8 % (ref 36.0–46.0)
Hemoglobin: 11.9 g/dL — ABNORMAL LOW (ref 12.0–15.0)
LYMPHS ABS: 1.8 10*3/uL (ref 0.7–4.0)
LYMPHS PCT: 19 %
MCH: 27.7 pg (ref 26.0–34.0)
MCHC: 32.3 g/dL (ref 30.0–36.0)
MCV: 85.8 fL (ref 78.0–100.0)
MONO ABS: 0.5 10*3/uL (ref 0.1–1.0)
MONOS PCT: 5 %
NEUTROS ABS: 7.1 10*3/uL (ref 1.7–7.7)
Neutrophils Relative %: 75 %
PLATELETS: 400 10*3/uL (ref 150–400)
RBC: 4.29 MIL/uL (ref 3.87–5.11)
RDW: 16 % — AB (ref 11.5–15.5)
WBC: 9.4 10*3/uL (ref 4.0–10.5)

## 2017-07-21 LAB — COMPREHENSIVE METABOLIC PANEL
ALT: 36 U/L (ref 14–54)
AST: 36 U/L (ref 15–41)
Albumin: 2.5 g/dL — ABNORMAL LOW (ref 3.5–5.0)
Alkaline Phosphatase: 76 U/L (ref 38–126)
Anion gap: 8 (ref 5–15)
BUN: 16 mg/dL (ref 6–20)
CHLORIDE: 116 mmol/L — AB (ref 101–111)
CO2: 23 mmol/L (ref 22–32)
Calcium: 8.4 mg/dL — ABNORMAL LOW (ref 8.9–10.3)
Creatinine, Ser: 0.63 mg/dL (ref 0.44–1.00)
Glucose, Bld: 102 mg/dL — ABNORMAL HIGH (ref 65–99)
POTASSIUM: 3.2 mmol/L — AB (ref 3.5–5.1)
Sodium: 147 mmol/L — ABNORMAL HIGH (ref 135–145)
TOTAL PROTEIN: 6.4 g/dL — AB (ref 6.5–8.1)
Total Bilirubin: 0.9 mg/dL (ref 0.3–1.2)

## 2017-07-21 LAB — MAGNESIUM: MAGNESIUM: 1.7 mg/dL (ref 1.7–2.4)

## 2017-07-21 LAB — PHOSPHORUS: Phosphorus: 2.4 mg/dL — ABNORMAL LOW (ref 2.5–4.6)

## 2017-07-21 MED ORDER — CLOPIDOGREL BISULFATE 75 MG PO TABS: 75.0000 mg | ORAL_TABLET | Freq: Every day | ORAL | 0 refills | Status: DC

## 2017-07-21 MED ORDER — POTASSIUM CHLORIDE 20 MEQ/15ML (10%) PO SOLN
40.0000 meq | Freq: Two times a day (BID) | ORAL | Status: DC
  Administered 2017-07-21: 40 meq via ORAL
  Filled 2017-07-21: qty 30

## 2017-07-21 MED ORDER — DICLOFENAC SODIUM 1 % TD GEL: 2.0000 g | Freq: Four times a day (QID) | TRANSDERMAL | 0 refills | Status: AC

## 2017-07-21 MED ORDER — PANTOPRAZOLE SODIUM 40 MG PO PACK: 40.0000 mg | PACK | Freq: Two times a day (BID) | ORAL | 0 refills | Status: DC

## 2017-07-21 MED ORDER — K PHOS MONO-SOD PHOS DI & MONO 155-852-130 MG PO TABS
500.0000 mg | ORAL_TABLET | Freq: Two times a day (BID) | ORAL | Status: DC
  Administered 2017-07-21: 500 mg
  Filled 2017-07-21: qty 2

## 2017-07-21 MED ORDER — TRAMADOL HCL 50 MG PO TABS: 50.0000 mg | ORAL_TABLET | Freq: Three times a day (TID) | ORAL | 0 refills | Status: DC

## 2017-07-21 NOTE — Care Management Important Message (Signed)
Important Message  Patient Details  Name: Adeliz Tonkinson MRN: [id number] Date of Birth: 1930/07/08   Medicare Important Message Given:  Yes    Kerin Salen 07/21/2017, 12:24 [place] Message  Patient Details  Name: Jalaina Salyers MRN: [id number] Date of Birth: Mar 06, 1930   Medicare Important Message Given:  Yes    Kerin Salen 07/21/2017, 12:24 PM

## 2017-07-21 NOTE — Clinical Social Work Note (Signed)
Clinical Social Work Assessment  Patient Details  Name: Alyssa Savage MRN: [id number] Date of Birth: 06-20-1930  Date of referral:  07/21/17               Reason for consult:  Facility Placement                Permission sought to share information with:    Permission granted to share information::     Name::        Agency::  [facility]   Relationship::     Contact Information:     Housing/Transportation Living arrangements for the past 2 months:  [place] of Information:    Patient Interpreter Needed:  None Criminal Activity/Legal Involvement Pertinent to Current Situation/Hospitalization:  No - Comment as needed Significant Relationships:  Adult Children Lives with:  Facility Resident Do you feel safe going back to the place where you live?  Yes Need for family participation in patient care:  Yes (Comment)  Care giving concerns: medical history significant of hypertension, hyperlipidemia, status post stroke with aphasia and right-sided hemiparesis was hospitalized a couple weeks ago for gross hematemesis.  Patient was discharged on PPI.  Her hemoglobin at that time was 10.  Patient has been in a nursing home and a couple days ago started having gross hematemesis again reported by family members.  Labs were checked patient's hemoglobin was 7 therefore patient was sent to the ED by the nursing home for an abnormal lab.  Patient referred for admission for GI bleed.    Social Worker assessment / plan:  CSW spoke patient daughter Alyssa Savage [facility]) and the patient son Alyssa Savage about discharge back to SNF.  The patient will return to [facility] for post acute care.  FL2 done. PASSR checked.   Plan: [facility]  Employment status:  Retired Forensic scientist:  Medicare PT Recommendations:  Not assessed at this time Information / Referral to community resources:  [place]  Patient/Family's Response to care:  Agreeable to care and transfer back  to [facility] for post acute care.   Patient/Family's Understanding of and Emotional Response to Diagnosis, Current Treatment, and Prognosis:  Patient daughter and son have a good understanding of patient diagnosis and treatment.   Emotional Assessment Appearance:  Appears stated age Attitude/Demeanor/Rapport:    Affect (typically observed):  Calm Orientation:    Alcohol / Substance use:  Not Applicable Psych involvement (Current and /or in the community):  No (Comment)  Discharge Needs  Concerns to be addressed:  Discharge Planning Concerns Readmission within the last 30 days:  Yes Current discharge risk:  None Barriers to Discharge:  Continued Medical Work up   [employer], LCSW 07/21/2017, 11:57 AM

## 2017-07-21 NOTE — NC FL2 (Signed)
[place] LEVEL OF CARE SCREENING TOOL     IDENTIFICATION  Patient Name: Alyssa Savage Birthdate: [birth year] Sex: female Admission Date (Current Location): 07/18/2017  [facility] [place] Number:  Herbalist and Address:  [facility],  [place], [place]      Provider Number: [id number]  Attending Physician Name and Address:  Kerney Elbe, DO  Relative Name and Phone Number:       Current Level of Care: Hospital Recommended Level of Care: [place] Prior Approval Number:    Date Approved/Denied:   PASRR Number: [id number]  Discharge Plan: SNF    Current Diagnoses: Patient Active Problem List   Diagnosis Date Noted  . GIB (gastrointestinal bleeding) 07/18/2017  . Anemia   . Hemoptysis 07/16/2017  . Status post insertion of percutaneous endoscopic gastrostomy (PEG) tube ([place]) 07/16/2017  . Acute hypernatremia 07/09/2017  . Dysphagia as late effect of stroke 07/09/2017  . Cognitive communication deficit 07/09/2017  . History of recurrent UTIs 07/09/2017  . Recurrent Clostridium difficile diarrhea 07/09/2017  . Severe protein-calorie malnutrition ([place]) 07/09/2017  . Hypertensive heart disease/LV Diastolic dysfunction 94/17/4081  . Positive D dimer 07/09/2017  . Troponin level elevated 07/09/2017  . Sacral decubitus ulcer, stage II 07/09/2017  . History of cerebrovascular accident (CVA) with residual deficit 06/26/2017  . Hemiparesis ([place]) 06/26/2017  . Abnormal liver function test 06/26/2017  . Normocytic anemia 06/26/2017  . Aphasia 06/26/2017  . Hyperlipidemia 06/26/2017  . Sacral decubitus ulcer, stage III ([place]) 06/26/2017  . Chronic diarrhea 06/26/2017  . Allergic rhinitis 06/26/2017  . Clostridium difficile diarrhea   . Uterine leiomyoma   . Rectal bleeding   . Nausea and vomiting 05/28/2017  . Constipation   . Acute CVA (cerebrovascular accident) ([place]) 04/22/2017  . Acute  encephalopathy 04/19/2017  . UTI (urinary tract infection) 04/19/2017  . Dysphagia 04/19/2017  . Displaced fracture of left femoral neck ([place]) 03/25/2017  . Protein-calorie malnutrition, severe 03/24/2017  . Fall 03/21/2017  . Fracture of femoral neck, left, closed ([place]) 03/21/2017  . AKI (acute kidney injury) ([place]) 03/21/2017  . Hypertensive emergency 03/21/2017  . Elevated troponin 03/21/2017  . Hypokalemia 03/21/2017  . Stroke ([place]) 03/21/2017  . GERD (gastroesophageal reflux disease)   . Closed fracture of left hip (H[facility])     Orientation RESPIRATION BLADDER Height & Weight     (Follows Commands )  Normal Incontinent, External catheter Weight: 111 lb (50.3 kg) Height:  5\' 3"  (160 cm)  BEHAVIORAL SYMPTOMS/MOOD NEUROLOGICAL BOWEL NUTRITION STATUS      Continent Feeding tube(PEG)  AMBULATORY STATUS COMMUNICATION OF NEEDS Skin   Extensive Assist Verbally  Pressure Injury Stage 3-Sacrum  Foam, Clean to dry, Every three days.                        Personal Care Assistance Level of Assistance  Bathing, Feeding, Dressing Bathing Assistance: Maximum assistance Feeding assistance: Independent Dressing Assistance: Maximum assistance     Functional Limitations Info  Sight, Hearing, Speech Sight Info: Adequate Hearing Info: Adequate Speech Info: Adequate    SPECIAL CARE FACTORS FREQUENCY        PT Frequency: 5x/week OT Frequency: 5x/week            Contractures Contractures Info: Not present    Additional Factors Info  Code Status, Allergies, Isolation Precautions Code Status Info: Fullocode  Allergies Info: No known allergies      Isolation  Precautions Info: Enteric      Current Medications (07/21/2017):  This is the current hospital active medication list Current Facility-Administered Medications  Medication Dose Route Frequency Provider Last Rate Last Dose  . 0.9 %  sodium chloride infusion  250 mL Intravenous PRN Ronnette Juniper, MD      . 0.9 %  sodium  chloride infusion   Intravenous Once Ronnette Juniper, MD      . acetaminophen (TYLENOL) tablet 650 mg  650 mg Oral Q6H PRN Ronnette Juniper, MD      . amLODipine (NORVASC) tablet 5 mg  5 mg Per Tube Daily Ronnette Juniper, MD   5 mg at 07/21/17 1036  . atorvastatin (LIPITOR) tablet 40 mg  40 mg Per Tube q1800 Ronnette Juniper, MD   40 mg at 07/20/17 1709  . carvedilol (COREG) tablet 6.25 mg  6.25 mg Per Tube BID WC Ronnette Juniper, MD   6.25 mg at 07/21/17 1035  . cholestyramine light (PREVALITE) packet 4 g  4 g Oral BID Ronnette Juniper, MD   4 g at 07/21/17 1036  . diclofenac sodium (VOLTAREN) 1 % transdermal gel 2 g  2 g Topical QID Ronnette Juniper, MD   2 g at 07/21/17 1035  . feeding supplement (OSMOLITE 1.5 CAL) liquid 237 mL  237 mL Per Tube QID Debbe Odea, MD   237 mL at 07/21/17 1036  . feeding supplement (PRO-STAT SUGAR FREE 64) liquid 30 mL  30 mL Oral BID Debbe Odea, MD   30 mL at 07/21/17 1035  . ondansetron (ZOFRAN) tablet 4 mg  4 mg Oral Q6H PRN Ronnette Juniper, MD       Or  . ondansetron [facility]) injection 4 mg  4 mg Intravenous Q6H PRN Ronnette Juniper, MD      . pantoprazole sodium (PROTONIX) 40 mg/20 mL oral suspension 40 mg  40 mg Per Tube BID Ronnette Juniper, MD   40 mg at 07/21/17 1036  . phosphorus (K PHOS NEUTRAL) tablet 500 mg  500 mg Per Tube BID Raiford Noble [place], DO   500 mg at 07/21/17 1204  . potassium chloride 20 MEQ/15ML (10%) solution 40 mEq  40 mEq Oral BID Raiford Noble [place], DO   40 mEq at 07/21/17 1035  . sodium chloride flush (NS) 0.9 % injection 3 mL  3 mL Intravenous Q12H Ronnette Juniper, MD   3 mL at 07/21/17 1036  . sodium chloride flush (NS) 0.9 % injection 3 mL  3 mL Intravenous PRN Ronnette Juniper, MD      . traMADol Veatrice Bourbon) tablet 50 mg  50 mg Per Tube TID PRN Ronnette Juniper, MD   50 mg at 07/19/17 0277     Discharge Medications: Please see discharge summary for a list of discharge medications.  Relevant Imaging Results:  Relevant Lab Results:   Additional Information SS#:  [id number]  Lia Hopping, LCSW

## 2017-07-21 NOTE — Discharge Summary (Signed)
Physician Discharge Summary  Alyssa Savage ZOX:[id number] DOB: 08-15-1930 DOA: 07/18/2017  PCP: Hennie Duos, MD  Admit date: 07/18/2017 Discharge date: 07/21/2017  Admitted From: SNF Disposition: SNF  Recommendations for Outpatient Follow-up:  1. Follow up with PCP in 1-2 weeks 2. Please obtain CMP/CBC, Mag, Phos in one week 3. Continue BID PP via PEG for 8 weeks and then once a day indefinitely  4. Resume Plavix in 2 days per GI Recommendations 5. Please follow up on the following pending results:  Home Health: No Equipment/Devices: None  Discharge Condition: Stable CODE STATUS: FULL CODE Diet recommendation: Tube Feedings   Brief/Interim Summary: Alyssa Savage 84 y.o.femalewithhypertension, hyperlipidemia, status post stroke with aphasia and right-sided hemiparesis and other comorbids who was hospitalized a couple weeks ago for gross hematemesis and discharged home on a PPI. Prior to this, she was hospitalized for C diff colitis. She was sent from the nursing home for hematemesis and Hb of 7 and found to have severe Esophagitis with superficial esophageal ulcer on EGD.  Allergy recommended holding Plavix for 3 days and starting twice daily PPI for 8 weeks and indefinitely afterwards.  She improved and hemoglobin hematocrit improved after 2 units of blood transfusion it was stable.  She is deemed medically stable to be discharged back to her skilled nursing facility at this time will need to follow-up with her primary care physician and Gastroenterology in the outpatient setting.  Discharge Diagnoses:  Principal Problem:   GIB (gastrointestinal bleeding) Active Problems:   Hypokalemia   GERD (gastroesophageal reflux disease)   Stroke (HCC)   Hemiparesis (HCC)   Normocytic anemia   Aphasia   Dysphagia as late effect of stroke   Status post insertion of percutaneous endoscopic gastrostomy (PEG) tube (HCC)   GIB (gastrointestinal bleeding) from Severe Esophagitis and  Esophageal Ulcers / GERD (gastroesophageal reflux disease) -Placed on BID PPI -Plavix on hold for now -[facility] Consulted and patient underwent EGD -EGD showed LA grade D esophagitis diffuse superficial esophageal ulcers with friable mucosa and 8 cm hiatal hernia and multiple gastric fundic polyps -GI recommends twice daily PPI to PEG tube for 8 weeks and then once a day indefinitely as Plavix will be continued -GI recommending resuming tube feedings and resuming the Plavix in 2 days and also recommending avoiding NSAIDs and other antiplatelets. -Follow-up with gastroenterology in outpatient setting within 4 weeks  Acute Blood Loss Anemia in the setting of Chronic Normocytic Iron Deficiency Anemia -Patient's hemoglobin/hematocrit trended down from 10.0/33.9 is now down to 10.0/33.-> 7.0/22.7 -Anemia panel done and showed iron level 17, TIBC of 283, TIBC of 300, saturation ratio 6%, ferritin level 21, folate level of 30.2, and vitamin B12 level of 776. -2 U PRBC given for Hb of 7.0- -Repeat Hb/Hct now 11.9/36.8 -Continue to Monitor for S/Sx of Bleeding -Continue to Hold Plavix for 2 more Days -Repeat CBC at SNF  Hypokalemia -Patient's potassium this morning was 3.2 -Replete with potassium chloride 40 mEq twice daily per tube through PEG -Continue to monitor and replete as necessary -Repeat CMP at SNF  Hypophosphatemia -Patient's phosphorus level is 2.4 -Replete with K-Phos neutral 500 mg p.o. twice daily x2 doses through PEG -Continue to monitor and replete as necessary -Repeat phosphorus level at SNF  Right Knee Discomfort -No swelling or warmth.  -No tenderness but she does state it is "sore"- -C/w Voltaren gel for now -If persists consider getting imaging of knee but can be done as an outpatient    HTN -BP  was high despite bleeding -Continue Amlodipine 5 mg per tube daily along with Carvedilol 6.25 mg per tube twice daily with meals  Hemiparesis / Aphasia in a patient  with a Hx of CVA -Does not ambulate and is in SNF -C/w Plavix in 2 days as advised by Gastroenterology  -C/w Atorvastatin 40 mg po Per Tube  Diarrhea -Has a Hx of C Difficile Colitis  -Patient had a few episodes of Diarrhea Yesterday with Watery Stools -GI sent for Stool for C Diff Sample -C Diff Ag Positive but Toxin Negative -Patient is Afebrile, has no Leukocytosis, and Has no Abdominal Pain -Likely Patient is Colonized with C Difficile and will not Treat -Continue with cholestyramine 4 g p.o. twice daily -Will not treat at this time but if worsens will need to see if patient has a fever or WBC prior to initiating Treatment for C Difficile    HLD -C/w Atorvastatin 40 mg per Tube Daily   Discharge Instructions  Discharge Instructions    Call MD for:  difficulty breathing, headache or visual disturbances   Complete by:  As directed    Call MD for:  extreme fatigue   Complete by:  As directed    Call MD for:  hives   Complete by:  As directed    Call MD for:  persistant dizziness or light-headedness   Complete by:  As directed    Call MD for:  persistant nausea and vomiting   Complete by:  As directed    Call MD for:  redness, tenderness, or signs of infection (pain, swelling, redness, odor or green/yellow discharge around incision site)   Complete by:  As directed    Call MD for:  severe uncontrolled pain   Complete by:  As directed    Call MD for:  temperature >100.4   Complete by:  As directed    Diet - low sodium heart healthy   Complete by:  As directed    Tube feedings as described   Discharge instructions   Complete by:  As directed    Follow-up with primary care physician as well as gastroenterology in the outpatient setting.  Take all medications as prescribed.  If symptoms change or worsen please return the emergency room for evaluation.   Increase activity slowly   Complete by:  As directed      Allergies as of 07/21/2017   No Known Allergies      Medication List    STOP taking these medications   famotidine 20 MG tablet Commonly known as:  PEPCID   pantoprazole 40 MG tablet Commonly known as:  PROTONIX Replaced by:  pantoprazole sodium 40 mg/20 mL Pack     TAKE these medications   acetaminophen 325 MG tablet Commonly known as:  TYLENOL Place 650 mg into feeding tube every 6 (six) hours as needed for mild pain or moderate pain. Notify MD if not relieved. Special Requirement - Pre-Pain (Physician Order) Not to exceed 3000mg  in 24 hour period   amLODipine 5 MG tablet Commonly known as:  NORVASC Place 5 mg into feeding tube daily.   atorvastatin 40 MG tablet Commonly known as:  LIPITOR Place 1 tablet (40 mg total) into feeding tube daily at 6 PM.   bisacodyl 10 MG suppository Commonly known as:  DULCOLAX Place 1 suppository (10 mg total) rectally daily as needed for moderate constipation.   carboxymethylcellulose 0.5 % Soln Commonly known as:  REFRESH PLUS Place 1 drop into both eyes every  8 (eight) hours as needed (dry eyes).   carvedilol 6.25 MG tablet Commonly known as:  COREG Place 6.25 mg into feeding tube 2 (two) times daily with a meal. HYPERTENSIVE HEART DISEASE WITH HEART FAILURE   cetirizine 10 MG tablet Commonly known as:  ZYRTEC Place 10 mg into feeding tube daily.   cholestyramine 4 g packet Commonly known as:  QUESTRAN Place 4 g into feeding tube 2 (two) times daily. Mixed with 4 ounces of water   clopidogrel 75 MG tablet Commonly known as:  PLAVIX Place 1 tablet (75 mg total) into feeding tube daily. RESUME ON 07/23/17 What changed:  additional instructions   diclofenac sodium 1 % Gel Commonly known as:  VOLTAREN Apply 2 g topically 4 (four) times daily.   feeding supplement (OSMOLITE 1.5 CAL) Liqd Place 1,000 mLs into feeding tube 4 (four) times daily. 1425 Kcal and 66g Pro daily ( May cont Osmolite 1.2 until Osmolite 1.5 available)   feeding supplement (PRO-STAT SUGAR FREE 64) Liqd Place  30 mLs into feeding tube 2 (two) times daily.   FLORASTOR 250 MG capsule Generic drug:  saccharomyces boulardii 250 mg. GIVE 1 CAPSULE VIA PEG TUBE DAILY AS SUPPLEMENT   free water Soln Place 200 mLs into feeding tube every 6 (six) hours.   magnesium hydroxide 400 MG/5ML suspension Commonly known as:  MILK OF MAGNESIA Place 30 mLs into feeding tube daily as needed for mild constipation.   nitroGLYCERIN 0.4 MG SL tablet Commonly known as:  NITROSTAT Place 1 tablet (0.4 mg total) under the tongue every 5 (five) minutes as needed for chest pain.   pantoprazole sodium 40 mg/20 mL Pack Commonly known as:  PROTONIX Place 20 mLs (40 mg total) into feeding tube 2 (two) times daily. FOR 8 Weeks and after Eight weeks take 40 mg po Daily Indefinitely Replaces:  pantoprazole 40 MG tablet   PREVALON Misc by Does not apply route. Boots to bilateral feet QD- while in bed-may remove for AM/PM care 7 am on/off 3 pm on/off 11 pm on/off   RA SALINE ENEMA RE Place 1 application rectally as needed (constipation).   ranitidine 150 MG/10ML syrup Commonly known as:  ZANTAC Place 10 mLs (150 mg total) into feeding tube daily.   traMADol 50 MG tablet Commonly known as:  ULTRAM Take 1 tablet (50 mg total) by mouth 3 (three) times daily.      Contact information for after-discharge care    Destination    [facility] .   Service:  Skilled Nursing Contact information: [address] [place] [phone]             No Known Allergies  Consultations:  [facility]  Procedures/Studies: Dg Chest 2 View  Result Date: 07/09/2017 CLINICAL DATA:  Shortness breath and hematemesis beginning this morning. EXAM: CHEST - 2 VIEW COMPARISON:  06/05/2017 FINDINGS: Stable borderline cardiomegaly. Stable ectasia of thoracic aorta. Both lungs are clear. No evidence of pleural effusion. IMPRESSION: Stable exam.  No active cardiopulmonary  disease. Electronically Signed   By: Earle Gell M.D.   On: 07/09/2017 11:57   Ct Angio Chest Pe W And/or Wo Contrast  Result Date: 07/09/2017 CLINICAL DATA:  Shortness of breath hemoptysis EXAM: CT ANGIOGRAPHY CHEST WITH CONTRAST TECHNIQUE: Multidetector CT imaging of the chest was performed using the standard protocol during bolus administration of intravenous contrast. Multiplanar CT image reconstructions and MIPs were obtained to evaluate the vascular anatomy. CONTRAST:  68mL  ISOVUE-370 IOPAMIDOL (ISOVUE-370) INJECTION 76% COMPARISON:  Chest radiograph July 09, 2017 FINDINGS: Cardiovascular: There is no demonstrable pulmonary embolus. There is no thoracic aortic aneurysm, although the aorta is somewhat ectatic. No dissection seen. It should be noted that the contrast bolus is not optimal for assessment for potential dissection. Visualized great vessels appear normal except for minimal calcification in the proximal left subclavian artery. There is no appreciable pericardial effusion or pericardial thickening. There is left ventricular hypertrophy. Mediastinum/Nodes: Thyroid appears unremarkable. There is no appreciable thoracic adenopathy. There are foci of air throughout much of the esophagus. There is a small hiatal hernia. Lungs/Pleura: There is no lung edema or consolidation. No pleural effusion or pleural thickening evident. There is slight lower lobe bronchiectatic change bilaterally. Upper Abdomen: Limited visualization in the upper abdomen appears grossly unremarkable. Musculoskeletal: No blastic or lytic bone lesions are evident. No chest wall lesions evident. Review of the MIP images confirms the above findings. IMPRESSION: 1. No demonstrable pulmonary embolus. No thoracic aortic aneurysm. Aorta is somewhat ectatic. No dissection seen. It must be cautioned that the contrast bolus is not optimal in the aorta for assessment for potential dissection. 2.  There is left ventricular hypertrophy. 3. No edema  or consolidation. Slight lower lobe bronchiectasis noted. 4.  No appreciable thoracic adenopathy. 5.  Small hiatal hernia.  Air noted throughout esophagus. Electronically Signed   By: Lowella Grip III M.D.   On: 07/09/2017 14:30   EGD Findings:      LA Grade D (one or more mucosal breaks involving at least 75% of       esophageal circumference) esophagitis with bleeding was found 20 to 34       cm from the incisors.      Few superficial esophageal ulcers with no bleeding and no stigmata of       recent bleeding were found 30 to 34 cm from the incisors. The largest       lesion was 8 mm in largest dimension. Biopsies were not taken as the       mucosa appeared friable and was bleeding easily on contact.      An 8 cm hiatal hernia was present.      Multiple small sessile polyps with no bleeding and no stigmata of recent       bleeding were found in the gastric body.      There was evidence of an intact gastrostomy with a patent G-tube present       in the gastric body. This was characterized by healthy appearing mucosa.      The cardia and gastric fundus were normal on retroflexion.      The examined duodenum was normal. Impression:               - LA Grade D esophagitis.                           - Non-bleeding esophageal ulcers, but friable and                            easy oozing on contact.                           - 8 cm hiatal hernia.                           -  Multiple gastric polyps.                           - Intact gastrostomy with a patent G-tube present                            characterized by healthy appearing mucosa.                           - Normal examined duodenum.                           - No specimens collected.  Subjective: Seen and examined at bedside and was not complaining of any abdominal pain, nausea or vomiting.  States that her right knee was hurting a little bit.  No chest pain or shortness breath.  Denied any other complaints or concerns at this  time and was doing well.  Ready go back to SNF  Discharge Exam: Vitals:   07/21/17 0621 07/21/17 0922  BP: (!) 167/96 (!) 149/92  Pulse: 93 70  Resp: 20 16  Temp: 98.3 F (36.8 C) 98.2 F (36.8 C)  SpO2: 98% 100%   Vitals:   07/20/17 1709 07/20/17 2012 07/21/17 0621 07/21/17 0922  BP: 129/78 (!) 152/95 (!) 167/96 (!) 149/92  Pulse: (!) 105 90 93 70  Resp:  20 20 16   Temp:  98 F (36.7 C) 98.3 F (36.8 C) 98.2 F (36.8 C)  TempSrc:  Oral Oral   SpO2:  100% 98% 100%  Weight:      Height:       General: Pt is alert and not in acute distress Cardiovascular: RRR, S1/S2 +, no rubs, no gallops Respiratory: Diminished bilaterally, no wheezing, no rhonchi Abdominal: Soft, NT, ND, bowel sounds +; PEG in place Extremities: no edema, no cyanosis but has some muscle atrophy and contractures from CVA   The results of significant diagnostics from this hospitalization (including imaging, microbiology, ancillary and laboratory) are listed below for reference.    Microbiology: Recent Results (from the past 240 hour(s))  C difficile quick scan w PCR reflex     Status: Abnormal   Collection Time: 07/20/17  7:00 PM  Result Value Ref Range Status   C Diff antigen POSITIVE (A) NEGATIVE Final   C Diff toxin NEGATIVE NEGATIVE Final   C Diff interpretation Results are indeterminate. See PCR results.  Final    Comment: Performed at [facility], [place] [address] [place], [place] [address]    Labs: BNP (last 3 results) No results for input(s): BNP in the last 8760 hours. Basic Metabolic Panel: Recent Labs  Lab 07/18/17 1627 07/19/17 0936 07/20/17 0602 07/21/17 0825  NA 145 146* 145 147*  K 3.2* 3.3* 3.5 3.2*  CL 113* 115* 115* 116*  CO2 25 23 24 23   GLUCOSE 119* 89 94 102*  BUN 39* 31* 21* 16  CREATININE 0.67 0.72 0.70 0.63  CALCIUM 8.3* 8.6* 8.6* 8.4*  MG  --   --   --  1.7  PHOS  --   --   --  2.4*   Liver Function Tests: Recent Labs  Lab 07/18/17 1627  07/21/17 0825  AST 36 36  ALT 35 36  ALKPHOS 67 76  BILITOT 0.3 0.9  PROT 5.7* 6.4*  ALBUMIN 2.3* 2.5*  No results for input(s): LIPASE, AMYLASE in the last 168 hours. No results for input(s): AMMONIA in the last 168 hours. CBC: Recent Labs  Lab 07/18/17 1627 07/19/17 0936 07/20/17 0602 07/21/17 0825  WBC 7.7 7.5 7.6 9.4  NEUTROABS 4.9  --   --  7.1  HGB 7.0* 10.8* 10.6* 11.9*  HCT 22.7* 33.3* 33.5* 36.8  MCV 88.7 86.9 87.9 85.8  PLT 488* 430* 456* 400   Cardiac Enzymes: No results for input(s): CKTOTAL, CKMB, CKMBINDEX, TROPONINI in the last 168 hours. BNP: Invalid input(s): POCBNP CBG: No results for input(s): GLUCAP in the last 168 hours. D-Dimer No results for input(s): DDIMER in the last 72 hours. Hgb A1c No results for input(s): HGBA1C in the last 72 hours. Lipid Profile No results for input(s): CHOL, HDL, LDLCALC, TRIG, CHOLHDL, LDLDIRECT in the last 72 hours. Thyroid function studies No results for input(s): TSH, T4TOTAL, T3FREE, THYROIDAB in the last 72 hours.  Invalid input(s): FREET3 Anemia work up Recent Labs    07/18/17 1624  VITAMINB12 776  FOLATE 30.2  FERRITIN 21  TIBC 300  IRON 17*  RETICCTPCT 8.5*   Urinalysis    Component Value Date/Time   COLORURINE YELLOW 07/09/2017 1545   APPEARANCEUR HAZY (A) 07/09/2017 1545   LABSPEC 1.016 07/09/2017 1545   PHURINE 5.0 07/09/2017 1545   GLUCOSEU NEGATIVE 07/09/2017 1545   HGBUR NEGATIVE 07/09/2017 1545   [place] 07/09/2017 1545   KETONESUR NEGATIVE 07/09/2017 1545   PROTEINUR NEGATIVE 07/09/2017 1545   NITRITE NEGATIVE 07/09/2017 1545   LEUKOCYTESUR NEGATIVE 07/09/2017 1545   Sepsis Labs Invalid input(s): PROCALCITONIN,  WBC,  LACTICIDVEN Microbiology Recent Results (from the past 240 hour(s))  C difficile quick scan w PCR reflex     Status: Abnormal   Collection Time: 07/20/17  7:00 PM  Result Value Ref Range Status   C Diff antigen POSITIVE (A) NEGATIVE Final   C Diff  toxin NEGATIVE NEGATIVE Final   C Diff interpretation Results are indeterminate. See PCR results.  Final    Comment: Performed at [facility], [place] [address] [place], [place] [address]   Time coordinating discharge: 35 minutes  SIGNED:  Kerney Elbe, DO [facility] 07/21/2017, 1:05 PM Pager [phone]  If 7PM-7AM, please contact night-coverage [email or web address] Password [email or web address]

## 2017-07-21 NOTE — Progress Notes (Signed)
D/C summary sent to [facility].  Nurse given the number to call report.  [facility] arranged to transport.  Patient daughter and son both aware of discharge.   Kathrin Greathouse, Latanya Presser, MSW Clinical Social Worker  [phone] 07/21/2017  2:25 PM

## 2017-07-22 ENCOUNTER — Telehealth: Payer: Self-pay

## 2017-07-22 ENCOUNTER — Non-Acute Institutional Stay (SKILLED_NURSING_FACILITY): Payer: Medicare Other | Admitting: Internal Medicine

## 2017-07-22 ENCOUNTER — Encounter (HOSPITAL_COMMUNITY): Payer: Self-pay | Admitting: Gastroenterology

## 2017-07-22 DIAGNOSIS — D500 Iron deficiency anemia secondary to blood loss (chronic): Secondary | ICD-10-CM

## 2017-07-22 DIAGNOSIS — I110 Hypertensive heart disease with heart failure: Secondary | ICD-10-CM

## 2017-07-22 DIAGNOSIS — E876 Hypokalemia: Secondary | ICD-10-CM | POA: Diagnosis not present

## 2017-07-22 DIAGNOSIS — K2100 Gastro-esophageal reflux disease with esophagitis, without bleeding: Secondary | ICD-10-CM

## 2017-07-22 DIAGNOSIS — I25118 Atherosclerotic heart disease of native coronary artery with other forms of angina pectoris: Secondary | ICD-10-CM | POA: Diagnosis not present

## 2017-07-22 DIAGNOSIS — E785 Hyperlipidemia, unspecified: Secondary | ICD-10-CM

## 2017-07-22 DIAGNOSIS — K209 Esophagitis, unspecified without bleeding: Secondary | ICD-10-CM

## 2017-07-22 DIAGNOSIS — D62 Acute posthemorrhagic anemia: Secondary | ICD-10-CM | POA: Diagnosis not present

## 2017-07-22 DIAGNOSIS — I5022 Chronic systolic (congestive) heart failure: Secondary | ICD-10-CM

## 2017-07-22 DIAGNOSIS — K922 Gastrointestinal hemorrhage, unspecified: Secondary | ICD-10-CM | POA: Diagnosis not present

## 2017-07-22 NOTE — Telephone Encounter (Signed)
Possible re-admission to facility. This is a patient you were seeing at [facility] F/U is needed if patient was re-admitted to facility upon discharge. Hospital discharge from [facility] on 07/21/2017

## 2017-07-22 NOTE — Progress Notes (Signed)
: Provider:  Noah Delaine. [name], MD Location:  [facility] Room Number: 893 Place of Service:  SNF [phone]  PCP: Hennie Duos, MD Patient Care Team: Hennie Duos, MD as PCP - General (Internal Medicine) Minus Breeding, MD as PCP - Cardiology (Cardiology)  Extended Emergency Contact Information Primary Emergency Contact: Lenore Cordia Mobile Phone: [phone] Relation: Son Secondary Emergency Contact: cromati,eartha Home Phone: [phone] Relation: Daughter     Allergies: Patient has no known allergies.  Chief Complaint  Patient presents with  . Readmit To SNF    HPI: Patient is 84 y.o. female with hypertension, hyperlipidemia, status post stroke with aphasia and right-sided hemiparesis, who was hospitalized several weeks ago for gross hematemesis.  Patient was discharged on PPI at that time and her hemoglobin was 10.  Patient has been in skilled nursing facility for several days and started having gross hematemesis again.  Patient's hemoglobin was 7 on lab recheck and patient was sent to the ED.  Patient cannot speak however her sisters report that she did have several episodes of coffee-ground emesis.  Patient denied any pain or diarrhea.  In the ED patient was heme-negative with normal rectal exam.  Patient was admitted to [facility] from [facility]20 where she was found to have a severe esophagitis with superficial esophageal ulcer on EGD.  Cardiology recommended holding Plavix for 3 days and starting PPI for 8 weeks and and indefinitely after.  Patient was transfused 2 units of PRBCs.  Patient is admitted back to skilled nursing facility for OT/PT.  While at skilled nursing facility patient will be followed for hypertension treated with Norvasc and Coreg, hyperlipidemia treated with Lipitor and coronary artery disease treated with Plavix, Coreg, and as needed sublingual nitroglycerin and statins.  Past Medical History:  Diagnosis Date    . Abnormal liver function test   . Cerebral infarct ([place])   . Cognitive communication deficit   . Difficulty walking   . Dysphagia   . Femoral neck fracture ([place]) 03/2017   left   . Gastrostomy status ([place])   . GERD (gastroesophageal reflux disease)   . Hemiparesis ([place])   . Hemiplegia ([place])   . Hyperlipidemia   . Hypertensive emergency 03/2017  . Hypertensive heart disease   . Hypokalemia 03/2017  . Muscle weakness   . Other hyperlipidemia   . Pain in left hip   . Presence of artificial hip, left   . Respiratory symptoms   . Severe protein-calorie malnutrition ([place])   . Stroke [facility])     Past Surgical History:  Procedure Laterality Date  . ANTERIOR APPROACH HEMI HIP ARTHROPLASTY Left 03/25/2017   Procedure: ANTERIOR APPROACH HEMI HIP ARTHROPLASTY;  Surgeon: Rod Can, MD;  Location: [facility];  Service: Orthopedics;  Laterality: Left;  . ESOPHAGOGASTRODUODENOSCOPY (EGD) WITH PROPOFOL N/A 07/20/2017   Procedure: ESOPHAGOGASTRODUODENOSCOPY (EGD) WITH PROPOFOL;  Surgeon: Ronnette Juniper, MD;  Location: [facility];  Service: Gastroenterology;  Laterality: N/A;  . IR GASTROSTOMY TUBE MOD SED  03/31/2017    Allergies as of 07/22/2017   No Known Allergies     Medication List        Accurate as of 07/22/17  1:56 PM. Always use your most recent med list.          acetaminophen 325 MG tablet Commonly known as:  TYLENOL Place 650 mg into feeding tube every 6 (six) hours as needed for mild pain or moderate pain. Notify MD if not relieved. Special  Requirement - Pre-Pain (Physician Order) Not to exceed 3000mg  in 24 hour period   amLODipine 5 MG tablet Commonly known as:  NORVASC Place 5 mg into feeding tube daily.   atorvastatin 40 MG tablet Commonly known as:  LIPITOR Place 1 tablet (40 mg total) into feeding tube daily at 6 PM.   bisacodyl 10 MG suppository Commonly known as:  DULCOLAX Place 1 suppository (10 mg total) rectally daily as needed for moderate constipation.    carboxymethylcellulose 0.5 % Soln Commonly known as:  REFRESH PLUS Place 1 drop into both eyes every 8 (eight) hours as needed (dry eyes).   carvedilol 6.25 MG tablet Commonly known as:  COREG Place 6.25 mg into feeding tube 2 (two) times daily with a meal. HYPERTENSIVE HEART DISEASE WITH HEART FAILURE   cetirizine 10 MG tablet Commonly known as:  ZYRTEC Place 10 mg into feeding tube daily.   cholestyramine 4 g packet Commonly known as:  QUESTRAN Place 4 g into feeding tube 2 (two) times daily. Mixed with 4 ounces of water   clopidogrel 75 MG tablet Commonly known as:  PLAVIX Place 1 tablet (75 mg total) into feeding tube daily. RESUME ON 07/23/17   diclofenac sodium 1 % Gel Commonly known as:  VOLTAREN Apply 2 g topically 4 (four) times daily.   feeding supplement (OSMOLITE 1.5 CAL) Liqd Place 1,000 mLs into feeding tube 4 (four) times daily. 1425 Kcal and 66g Pro daily ( May cont Osmolite 1.2 until Osmolite 1.5 available)   feeding supplement (PRO-STAT SUGAR FREE 64) Liqd Place 30 mLs into feeding tube 2 (two) times daily.   FLORASTOR 250 MG capsule Generic drug:  saccharomyces boulardii 250 mg. GIVE 1 CAPSULE VIA PEG TUBE DAILY AS SUPPLEMENT   free water Soln Place 200 mLs into feeding tube every 6 (six) hours.   magnesium hydroxide 400 MG/5ML suspension Commonly known as:  MILK OF MAGNESIA Place 30 mLs into feeding tube daily as needed for mild constipation.   nitroGLYCERIN 0.4 MG SL tablet Commonly known as:  NITROSTAT Place 1 tablet (0.4 mg total) under the tongue every 5 (five) minutes as needed for chest pain.   pantoprazole sodium 40 mg/20 mL Pack Commonly known as:  PROTONIX Place 40 mg into feeding tube 2 (two) times daily.   PREVALON Misc by Does not apply route. Boots to bilateral feet QD- while in bed-may remove for AM/PM care 7 am on/off 3 pm on/off 11 pm on/off   RA SALINE ENEMA RE Place 1 application rectally as needed (constipation).    ranitidine 150 MG/10ML syrup Commonly known as:  ZANTAC Place 10 mLs (150 mg total) into feeding tube daily.   traMADol 50 MG tablet Commonly known as:  ULTRAM Take 1 tablet (50 mg total) by mouth 3 (three) times daily.       No orders of the defined types were placed in this encounter.    There is no immunization history on file for this patient.  Social History   Tobacco Use  . Smoking status: Never Smoker  . Smokeless tobacco: Never Used  Substance Use Topics  . Alcohol use: No    Frequency: Never    Family history is   Family History  Problem Relation Age of Onset  . CAD Mother   . Heart disease Mother       Review of Systems  DATA OBTAINED: from patient-very limited; nursing-no acute concerns GENERAL:  no fevers, fatigue, appetite changes SKIN: No itching, or rash  EYES: No eye pain, redness, discharge EARS: No earache, tinnitus, change in hearing NOSE: No congestion, drainage or bleeding  MOUTH/THROAT: No mouth or tooth pain, No sore throat RESPIRATORY: No cough, wheezing, SOB CARDIAC: No chest pain, palpitations, lower extremity edema  GI: No abdominal pain, No N/V/D or constipation, No heartburn or reflux  GU: No dysuria, frequency or urgency, or incontinence  MUSCULOSKELETAL: No unrelieved bone/joint pain NEUROLOGIC: No headache, dizziness or focal weakness PSYCHIATRIC: No c/o anxiety or sadness   Vitals:   07/22/17 1346  BP: 134/83  Pulse: 89  Resp: 20  Temp: 98.2 F (36.8 C)  SpO2: 97%    SpO2 Readings from Last 1 Encounters:  07/22/17 97%   Body mass index is 19.66 kg/m.     Physical Exam  GENERAL APPEARANCE: Alert, non-conversant,  No acute distress.  SKIN: No diaphoresis rash HEAD: Normocephalic, atraumatic  EYES: Conjunctiva/lids clear. Pupils round, reactive. EOMs intact.  EARS: External exam WNL, canals clear. Hearing grossly normal.  NOSE: No deformity or discharge.  MOUTH/THROAT: Lips w/o lesions  RESPIRATORY:  Breathing is even, unlabored. Lung sounds are clear   CARDIOVASCULAR: Heart RRR no murmurs, rubs or gallops. No peripheral edema.   GASTROINTESTINAL: Abdomen is soft, non-tender, not distended w/ normal bowel sounds; PEG tube. GENITOURINARY: Bladder non tender, not distended  MUSCULOSKELETAL: No abnormal joints or musculature NEUROLOGIC:  Cranial nerves 2-12 grossly intact; right-sided weakness PSYCHIATRIC: Mood and affect appropriate to situation, no behavioral issues  Patient Active Problem List   Diagnosis Date Noted  . GIB (gastrointestinal bleeding) 07/18/2017  . Anemia   . Hemoptysis 07/16/2017  . Status post insertion of percutaneous endoscopic gastrostomy (PEG) tube ([place]) 07/16/2017  . Acute hypernatremia 07/09/2017  . Dysphagia as late effect of stroke 07/09/2017  . Cognitive communication deficit 07/09/2017  . History of recurrent UTIs 07/09/2017  . Recurrent Clostridium difficile diarrhea 07/09/2017  . Severe protein-calorie malnutrition ([place]) 07/09/2017  . Hypertensive heart disease/LV Diastolic dysfunction 99/24/2683  . Positive D dimer 07/09/2017  . Troponin level elevated 07/09/2017  . Sacral decubitus ulcer, stage II 07/09/2017  . History of cerebrovascular accident (CVA) with residual deficit 06/26/2017  . Hemiparesis ([place]) 06/26/2017  . Abnormal liver function test 06/26/2017  . Normocytic anemia 06/26/2017  . Aphasia 06/26/2017  . Hyperlipidemia 06/26/2017  . Sacral decubitus ulcer, stage III ([place]) 06/26/2017  . Chronic diarrhea 06/26/2017  . Allergic rhinitis 06/26/2017  . Clostridium difficile diarrhea   . Uterine leiomyoma   . Rectal bleeding   . Nausea and vomiting 05/28/2017  . Constipation   . Acute CVA (cerebrovascular accident) ([place]) 04/22/2017  . Acute encephalopathy 04/19/2017  . UTI (urinary tract infection) 04/19/2017  . Dysphagia 04/19/2017  . Displaced fracture of left femoral neck ([place]) 03/25/2017  . Protein-calorie malnutrition, severe  03/24/2017  . Fall 03/21/2017  . Fracture of femoral neck, left, closed ([place]) 03/21/2017  . AKI (acute kidney injury) ([place]) 03/21/2017  . Hypertensive emergency 03/21/2017  . Elevated troponin 03/21/2017  . Hypokalemia 03/21/2017  . Stroke ([place]) 03/21/2017  . GERD (gastroesophageal reflux disease)   . Closed fracture of left hip [facility])       Labs reviewed: Basic Metabolic Panel:    Component Value Date/Time   NA 147 (H) 07/21/2017 0825   NA 143 06/07/2017 1428   K 3.2 (L) 07/21/2017 0825   CL 116 (H) 07/21/2017 0825   CO2 23 07/21/2017 0825   GLUCOSE 102 (H) 07/21/2017 0825   BUN 16 07/21/2017 0825  BUN 15 06/07/2017 1428   CREATININE 0.63 07/21/2017 0825   CALCIUM 8.4 (L) 07/21/2017 0825   PROT 6.4 (L) 07/21/2017 0825   ALBUMIN 2.5 (L) 07/21/2017 0825   AST 36 07/21/2017 0825   ALT 36 07/21/2017 0825   ALKPHOS 76 07/21/2017 0825   BILITOT 0.9 07/21/2017 0825   GFRNONAA >60 07/21/2017 0825   GFRAA >60 07/21/2017 0825    Recent Labs    03/26/17 0215 03/27/17 0429 03/29/17 0917  07/19/17 0936 07/20/17 0602 07/21/17 0825  NA 140 139 143   < > 146* 145 147*  K 3.6 3.4* 3.7   < > 3.3* 3.5 3.2*  CL 109 109 109   < > 115* 115* 116*  CO2 21* 20* 24   < > 23 24 23   GLUCOSE 170* 178* 122*   < > 89 94 102*  BUN 25* 30* 27*   < > 31* 21* 16  CREATININE 1.41* 1.55* 1.18*   < > 0.72 0.70 0.63  CALCIUM 7.8* 7.8* 8.4*   < > 8.6* 8.6* 8.4*  MG 1.4* 1.9 1.7  --   --   --  1.7  PHOS 2.4* 2.2*  --   --   --   --  2.4*   < > = values in this interval not displayed.   Liver Function Tests: Recent Labs    07/09/17 1003 07/18/17 1627 07/21/17 0825  AST 25 36 36  ALT 28 35 36  ALKPHOS 70 67 76  BILITOT 0.3 0.3 0.9  PROT 7.1 5.7* 6.4*  ALBUMIN 2.6* 2.3* 2.5*   Recent Labs    05/27/17 2346  LIPASE 72*   Recent Labs    04/19/17 1421  AMMONIA 22   CBC: Recent Labs    07/09/17 1003  07/18/17 1627 07/19/17 0936 07/20/17 0602 07/21/17 0825  WBC 10.4   < > 7.7  7.5 7.6 9.4  NEUTROABS 7.8*  --  4.9  --   --  7.1  HGB 9.6*   < > 7.0* 10.8* 10.6* 11.9*  HCT 32.3*   < > 22.7* 33.3* 33.5* 36.8  MCV 90.0   < > 88.7 86.9 87.9 85.8  PLT 465*   < > 488* 430* 456* 400   < > = values in this interval not displayed.   Lipid Recent Labs    03/22/17 0344  CHOL 203*  HDL 68  LDLCALC 117*  TRIG 90    Cardiac Enzymes: Recent Labs    05/27/17 2346 05/28/17 0533 07/09/17 1003  TROPONINI 0.04* <0.03 0.03*   BNP: No results for input(s): BNP in the last 8760 hours. No results found for: [facility] Lab Results  Component Value Date   HGBA1C 6.3 (H) 03/22/2017   No results found for: TSH Lab Results  Component Value Date   VITAMINB12 776 07/18/2017   Lab Results  Component Value Date   FOLATE 30.2 07/18/2017   Lab Results  Component Value Date   IRON 17 (L) 07/18/2017   TIBC 300 07/18/2017   FERRITIN 21 07/18/2017    Imaging and Procedures obtained prior to SNF admission: No results found.   Not all labs, radiology exams or other studies done during hospitalization come through on my EPIC note; however they are reviewed by me.    Assessment and Plan  Upper GI bleed/esophagitis/esophageal ulcers/GERD- patient placed on Protonix 40 mg twice daily for 8 weeks and then daily; Plavix was held for 3 days then restarted; patient underwent EGD  which showed grade D esophagitis, superficial esophageal ulcers with friable mucosa 8 cm hiatal hernia and multiple gastric fundic polyps; avoid NSAIDs and other antiplatelets; follow-up with gastroenterology as outpatient in 4 weeks SNF -patient is admitted back to skilled nursing facility for OT/PT; will continue Protonix 40 mg twice daily for 8 weeks then once daily indefinitely  Acute blood loss anemia/chronic iron deficiency anemia- patient's hemoglobin trended from 10.0-7.0 in several days; patient's iron level was 17; patient was transfused with 2 2 units PRBCs with a discharge hemoglobin of  11.9 SNF -follow-up CBC; start on iron 325 mg   Hypokalemia/hypophosphatemia- repleted SNF -we will follow-up potassium and phosphorus first levels  Coronary artery disease-history of CVA SNF -continue Plavix 75 mg daily, continue Coreg 6.25 mg twice daily and sublingual nitroglycerin as needed; patient is on statin  Hypertension SNF -continue Norvasc 5 mg and Coreg 6.25 mg twice daily  Hyperlipidemia SNF -continue Lipitor 40 mg daily   Time spent greater than 45 minutes;> 50% of time with patient was spent reviewing records, labs, tests and studies, counseling and developing plan of care  Webb Silversmith D. [name], MD

## 2017-07-26 LAB — CBC AND DIFFERENTIAL
HCT: 35 — AB (ref 36–46)
HCT: 35 — AB (ref 36–46)
Hemoglobin: 11.3 — AB (ref 12.0–16.0)
Hemoglobin: 11.3 — AB (ref 12.0–16.0)
PLATELETS: 343 (ref 150–399)
Platelets: 343 (ref 150–399)
WBC: 7.4
WBC: 7.4

## 2017-07-26 NOTE — Progress Notes (Signed)
This encounter was created in error - please disregard.

## 2017-07-30 ENCOUNTER — Encounter: Payer: Self-pay | Admitting: Internal Medicine

## 2017-07-30 DIAGNOSIS — D509 Iron deficiency anemia, unspecified: Secondary | ICD-10-CM | POA: Insufficient documentation

## 2017-07-30 DIAGNOSIS — K209 Esophagitis, unspecified without bleeding: Secondary | ICD-10-CM | POA: Insufficient documentation

## 2017-07-30 DIAGNOSIS — I2510 Atherosclerotic heart disease of native coronary artery without angina pectoris: Secondary | ICD-10-CM | POA: Insufficient documentation

## 2017-07-30 DIAGNOSIS — D62 Acute posthemorrhagic anemia: Secondary | ICD-10-CM | POA: Insufficient documentation

## 2017-08-02 LAB — CBC AND DIFFERENTIAL
HCT: 31 — AB (ref 36–46)
Hemoglobin: 10 — AB (ref 12.0–16.0)
Platelets: 281 (ref 150–399)
WBC: 5.7

## 2017-08-06 ENCOUNTER — Encounter: Payer: Self-pay | Admitting: Internal Medicine

## 2017-08-06 NOTE — Progress Notes (Signed)
Location:  [place] of Service:  SNF (31)  Hennie Duos, MD  Patient Care Team: Hennie Duos, MD as PCP - General (Internal Medicine) Minus Breeding, MD as PCP - Cardiology (Cardiology)  Extended Emergency Contact Information Primary Emergency Contact: Lenore Cordia Mobile Phone: [phone] Relation: Son Secondary Emergency Contact: cromati,eartha Home Phone: [phone] Relation: Daughter    Allergies: Patient has no known allergies.  Chief Complaint  Patient presents with  . Acute Visit    HPI: Patient is 84 y.o. female who is being seen on Friday afternoon late because her hemoglobin has returned at 8.1 which is down from 10 on 6/9.  Feeding tube is still working fine, patient denies pain, weakness, dizziness, shortness of breath.  Nurses had noted a small amount of blood NG tube with feeding.  Past Medical History:  Diagnosis Date  . Abnormal liver function test   . Cerebral infarct ([place])   . Cognitive communication deficit   . Difficulty walking   . Dysphagia   . Femoral neck fracture ([place]) 03/2017   left   . Gastrostomy status ([place])   . GERD (gastroesophageal reflux disease)   . Hemiparesis ([place])   . Hemiplegia ([place])   . Hyperlipidemia   . Hypertensive emergency 03/2017  . Hypertensive heart disease   . Hypokalemia 03/2017  . Muscle weakness   . Other hyperlipidemia   . Pain in left hip   . Presence of artificial hip, left   . Respiratory symptoms   . Severe protein-calorie malnutrition ([place])   . Stroke [facility])     Past Surgical History:  Procedure Laterality Date  . ANTERIOR APPROACH HEMI HIP ARTHROPLASTY Left 03/25/2017   Procedure: ANTERIOR APPROACH HEMI HIP ARTHROPLASTY;  Surgeon: Rod Can, MD;  Location: [place];  Service: Orthopedics;  Laterality: Left;  . ESOPHAGOGASTRODUODENOSCOPY (EGD) WITH PROPOFOL N/A 07/20/2017   Procedure: ESOPHAGOGASTRODUODENOSCOPY (EGD) WITH PROPOFOL;  Surgeon: Ronnette Juniper,  MD;  Location: [facility];  Service: Gastroenterology;  Laterality: N/A;  . IR GASTROSTOMY TUBE MOD SED  03/31/2017    Allergies as of 07/15/2017   No Known Allergies     Medication List        Accurate as of 07/15/17 11:59 PM. Always use your most recent med list.          acetaminophen 325 MG tablet Commonly known as:  TYLENOL Place 650 mg into feeding tube every 6 (six) hours as needed for mild pain or moderate pain. Notify MD if not relieved. Special Requirement - Pre-Pain (Physician Order) Not to exceed 3000mg  in 24 hour period   amLODipine 5 MG tablet Commonly known as:  NORVASC Place 5 mg into feeding tube daily.   atorvastatin 40 MG tablet Commonly known as:  LIPITOR Place 1 tablet (40 mg total) into feeding tube daily at 6 PM.   bisacodyl 10 MG suppository Commonly known as:  DULCOLAX Place 1 suppository (10 mg total) rectally daily as needed for moderate constipation.   carboxymethylcellulose 0.5 % Soln Commonly known as:  REFRESH PLUS Place 1 drop into both eyes every 8 (eight) hours as needed (dry eyes).   carvedilol 6.25 MG tablet Commonly known as:  COREG Place 6.25 mg into feeding tube 2 (two) times daily with a meal. HYPERTENSIVE HEART DISEASE WITH HEART FAILURE   cetirizine 10 MG tablet Commonly known as:  ZYRTEC Place 10 mg into feeding tube daily.   cholestyramine 4 g packet Commonly known as:  QUESTRAN Place 4 g into feeding tube 2 (two) times daily. Mixed with 4 ounces of water   clopidogrel 75 MG tablet Commonly known as:  PLAVIX Place 1 tablet (75 mg total) into feeding tube daily.   famotidine 20 MG tablet Commonly known as:  PEPCID Take 1 tablet (20 mg total) by mouth daily.   feeding supplement (OSMOLITE 1.5 CAL) Liqd Place 1,000 mLs into feeding tube 4 (four) times daily. 1425 Kcal and 66g Pro daily ( May cont Osmolite 1.2 until Osmolite 1.5 available)   feeding supplement (PRO-STAT SUGAR FREE 64) Liqd Place 30 mLs into feeding  tube 2 (two) times daily.   FLORASTOR 250 MG capsule Generic drug:  saccharomyces boulardii 250 mg. GIVE 1 CAPSULE VIA PEG TUBE DAILY AS SUPPLEMENT   free water Soln Place 200 mLs into feeding tube every 6 (six) hours.   magnesium hydroxide 400 MG/5ML suspension Commonly known as:  MILK OF MAGNESIA Place 30 mLs into feeding tube daily as needed for mild constipation.   nitroGLYCERIN 0.4 MG SL tablet Commonly known as:  NITROSTAT Place 1 tablet (0.4 mg total) under the tongue every 5 (five) minutes as needed for chest pain.   PREVALON Misc by Does not apply route. Boots to bilateral feet QD- while in bed-may remove for AM/PM care 7 am on/off 3 pm on/off 11 pm on/off   PROSTAT PO 30 mLs by PEG Tube route 2 (two) times daily.   ranitidine 150 MG/10ML syrup Commonly known as:  ZANTAC Place 10 mLs (150 mg total) into feeding tube daily.   traMADol 50 MG tablet Commonly known as:  ULTRAM Take 50 mg by mouth 3 (three) times daily.       No orders of the defined types were placed in this encounter.    There is no immunization history on file for this patient.  Social History   Tobacco Use  . Smoking status: Never Smoker  . Smokeless tobacco: Never Used  Substance Use Topics  . Alcohol use: No    Frequency: Never    Review of Systems  DATA OBTAINED: from patient, nurse, medical record GENERAL:  no fevers, fatigue, appetite changes SKIN: No itching, rash HEENT: No complaint RESPIRATORY: No cough, wheezing, SOB CARDIAC: No chest pain, palpitations, lower extremity edema  GI: No abdominal pain, No N/V/D or constipation, No heartburn or reflux some blood noted with use of feeding tube GU: No dysuria, frequency or urgency, or incontinence  MUSCULOSKELETAL: No unrelieved bone/joint pain NEUROLOGIC: No headache, dizziness  PSYCHIATRIC: No overt anxiety or sadness  Vitals:   08/06/17 1533  BP: 107/71  Pulse: 89  Resp: 18  Temp: (!) 97.5 F (36.4 C)   There is  no height or weight on file to calculate BMI. Physical Exam  GENERAL APPEARANCE: Alert, conversant, No acute distress  SKIN: No diaphoresis rash HEENT: Unremarkable RESPIRATORY: Breathing is even, unlabored. Lung sounds are clear   CARDIOVASCULAR: Heart RRR no murmurs, rubs or gallops. No peripheral edema  GASTROINTESTINAL: Abdomen is soft, non-tender, not distended w/ normal bowel sounds me: G-tube GENITOURINARY: Bladder non tender, not distended  MUSCULOSKELETAL: No abnormal joints or musculature NEUROLOGIC: Cranial nerves 2-12 grossly intact. Moves all extremities PSYCHIATRIC: Mood and affect appropriate to situation, no behavioral issues  Patient Active Problem List   Diagnosis Date Noted  . Esophagitis 07/30/2017  . Acute blood loss anemia 07/30/2017  . Iron deficiency anemia 07/30/2017  . Hypophosphatemia 07/30/2017  . Coronary artery disease 07/30/2017  . Upper  GI bleed 07/18/2017  . Anemia   . Hemoptysis 07/16/2017  . Status post insertion of percutaneous endoscopic gastrostomy (PEG) tube ([place]) 07/16/2017  . Acute hypernatremia 07/09/2017  . Dysphagia as late effect of stroke 07/09/2017  . Cognitive communication deficit 07/09/2017  . History of recurrent UTIs 07/09/2017  . Recurrent Clostridium difficile diarrhea 07/09/2017  . Severe protein-calorie malnutrition ([place]) 07/09/2017  . Hypertensive heart disease/LV Diastolic dysfunction 57/02/7791  . Positive D dimer 07/09/2017  . Troponin level elevated 07/09/2017  . Sacral decubitus ulcer, stage II 07/09/2017  . History of cerebrovascular accident (CVA) with residual deficit 06/26/2017  . Hemiparesis ([place]) 06/26/2017  . Abnormal liver function test 06/26/2017  . Normocytic anemia 06/26/2017  . Aphasia 06/26/2017  . Hyperlipidemia 06/26/2017  . Sacral decubitus ulcer, stage III ([place]) 06/26/2017  . Chronic diarrhea 06/26/2017  . Allergic rhinitis 06/26/2017  . Clostridium difficile diarrhea   . Uterine leiomyoma   .  Rectal bleeding   . Nausea and vomiting 05/28/2017  . Constipation   . Acute CVA (cerebrovascular accident) ([place]) 04/22/2017  . Acute encephalopathy 04/19/2017  . UTI (urinary tract infection) 04/19/2017  . Dysphagia 04/19/2017  . Displaced fracture of left femoral neck ([place]) 03/25/2017  . Protein-calorie malnutrition, severe 03/24/2017  . Fall 03/21/2017  . Fracture of femoral neck, left, closed ([place]) 03/21/2017  . AKI (acute kidney injury) ([place]) 03/21/2017  . Hypertensive emergency 03/21/2017  . Elevated troponin 03/21/2017  . Hypokalemia 03/21/2017  . Stroke ([place]) 03/21/2017  . GERD (gastroesophageal reflux disease)   . Closed fracture of left hip ([facility])     CMP     Component Value Date/Time   NA 147 (H) 07/21/2017 0825   NA 143 06/07/2017 1428   K 3.2 (L) 07/21/2017 0825   CL 116 (H) 07/21/2017 0825   CO2 23 07/21/2017 0825   GLUCOSE 102 (H) 07/21/2017 0825   BUN 16 07/21/2017 0825   BUN 15 06/07/2017 1428   CREATININE 0.63 07/21/2017 0825   CALCIUM 8.4 (L) 07/21/2017 0825   PROT 6.4 (L) 07/21/2017 0825   ALBUMIN 2.5 (L) 07/21/2017 0825   AST 36 07/21/2017 0825   ALT 36 07/21/2017 0825   ALKPHOS 76 07/21/2017 0825   BILITOT 0.9 07/21/2017 0825   GFRNONAA >60 07/21/2017 0825   GFRAA >60 07/21/2017 0825   Recent Labs    03/26/17 0215 03/27/17 0429 03/29/17 0917  07/19/17 0936 07/20/17 0602 07/21/17 0825  NA 140 139 143   < > 146* 145 147*  K 3.6 3.4* 3.7   < > 3.3* 3.5 3.2*  CL 109 109 109   < > 115* 115* 116*  CO2 21* 20* 24   < > 23 24 23   GLUCOSE 170* 178* 122*   < > 89 94 102*  BUN 25* 30* 27*   < > 31* 21* 16  CREATININE 1.41* 1.55* 1.18*   < > 0.72 0.70 0.63  CALCIUM 7.8* 7.8* 8.4*   < > 8.6* 8.6* 8.4*  MG 1.4* 1.9 1.7  --   --   --  1.7  PHOS 2.4* 2.2*  --   --   --   --  2.4*   < > = values in this interval not displayed.   Recent Labs    07/09/17 1003 07/18/17 1627 07/21/17 0825  AST 25 36 36  ALT 28 35 36  ALKPHOS 70 67 76  BILITOT 0.3  0.3 0.9  PROT 7.1 5.7* 6.4*  ALBUMIN 2.6* 2.3*  2.5*   Recent Labs    07/09/17 1003  07/18/17 1627 07/19/17 0936 07/20/17 0602 07/21/17 0825  WBC 10.4   < > 7.7 7.5 7.6 9.4  NEUTROABS 7.8*  --  4.9  --   --  7.1  HGB 9.6*   < > 7.0* 10.8* 10.6* 11.9*  HCT 32.3*   < > 22.7* 33.3* 33.5* 36.8  MCV 90.0   < > 88.7 86.9 87.9 85.8  PLT 465*   < > 488* 430* 456* 400   < > = values in this interval not displayed.   Recent Labs    03/22/17 0344  CHOL 203*  LDLCALC 117*  TRIG 90   No results found for: MICROALBUR No results found for: TSH Lab Results  Component Value Date   HGBA1C 6.3 (H) 03/22/2017   Lab Results  Component Value Date   CHOL 203 (H) 03/22/2017   HDL 68 03/22/2017   LDLCALC 117 (H) 03/22/2017   TRIG 90 03/22/2017   CHOLHDL 3.0 03/22/2017    Significant Diagnostic Results in last 30 days:  Dg Chest 2 View  Result Date: 07/09/2017 CLINICAL DATA:  Shortness breath and hematemesis beginning this morning. EXAM: CHEST - 2 VIEW COMPARISON:  06/05/2017 FINDINGS: Stable borderline cardiomegaly. Stable ectasia of thoracic aorta. Both lungs are clear. No evidence of pleural effusion. IMPRESSION: Stable exam.  No active cardiopulmonary disease. Electronically Signed   By: Earle Gell M.D.   On: 07/09/2017 11:57   Ct Angio Chest Pe W And/or Wo Contrast  Result Date: 07/09/2017 CLINICAL DATA:  Shortness of breath hemoptysis EXAM: CT ANGIOGRAPHY CHEST WITH CONTRAST TECHNIQUE: Multidetector CT imaging of the chest was performed using the standard protocol during bolus administration of intravenous contrast. Multiplanar CT image reconstructions and MIPs were obtained to evaluate the vascular anatomy. CONTRAST:  46mL ISOVUE-370 IOPAMIDOL (ISOVUE-370) INJECTION 76% COMPARISON:  Chest radiograph July 09, 2017 FINDINGS: Cardiovascular: There is no demonstrable pulmonary embolus. There is no thoracic aortic aneurysm, although the aorta is somewhat ectatic. No dissection seen. It should be  noted that the contrast bolus is not optimal for assessment for potential dissection. Visualized great vessels appear normal except for minimal calcification in the proximal left subclavian artery. There is no appreciable pericardial effusion or pericardial thickening. There is left ventricular hypertrophy. Mediastinum/Nodes: Thyroid appears unremarkable. There is no appreciable thoracic adenopathy. There are foci of air throughout much of the esophagus. There is a small hiatal hernia. Lungs/Pleura: There is no lung edema or consolidation. No pleural effusion or pleural thickening evident. There is slight lower lobe bronchiectatic change bilaterally. Upper Abdomen: Limited visualization in the upper abdomen appears grossly unremarkable. Musculoskeletal: No blastic or lytic bone lesions are evident. No chest wall lesions evident. Review of the MIP images confirms the above findings. IMPRESSION: 1. No demonstrable pulmonary embolus. No thoracic aortic aneurysm. Aorta is somewhat ectatic. No dissection seen. It must be cautioned that the contrast bolus is not optimal in the aorta for assessment for potential dissection. 2.  There is left ventricular hypertrophy. 3. No edema or consolidation. Slight lower lobe bronchiectasis noted. 4.  No appreciable thoracic adenopathy. 5.  Small hiatal hernia.  Air noted throughout esophagus. Electronically Signed   By: Lowella Grip III M.D.   On: 07/09/2017 14:30    Assessment and Plan  Acute blood loss anemia/probable GI bleed- I spoke with Dr. Watt Climes, GI who was extremely helpful, thank you so much; we lavaged patient's stomach with 400 cc of water and  we will pulled back light beige-brown fluid, no active bleeding; per Dr. Perley Jain recommendations we started Protonix 40 mg daily, we continued patient's Zantac, we held the patient's Plavix and we are repeating a CBC on Monday 6/17; at that time it is felt that patient's hemoglobin will have fallen low enough to warrant a  visit to the emergency department; low threshold for sending patient to hospital for any kind of acute bleeding    Time spent greater than 35 minutes;> 50% of time with patient was spent reviewing records, labs, tests and studies, counseling and developing plan of care  Inocencio Homes, MD

## 2017-08-16 ENCOUNTER — Encounter: Payer: Self-pay | Admitting: Internal Medicine

## 2017-08-16 ENCOUNTER — Non-Acute Institutional Stay (SKILLED_NURSING_FACILITY): Payer: Medicare Other | Admitting: Internal Medicine

## 2017-08-16 DIAGNOSIS — I6930 Unspecified sequelae of cerebral infarction: Secondary | ICD-10-CM

## 2017-08-16 DIAGNOSIS — I69391 Dysphagia following cerebral infarction: Secondary | ICD-10-CM

## 2017-08-16 DIAGNOSIS — I10 Essential (primary) hypertension: Secondary | ICD-10-CM | POA: Diagnosis not present

## 2017-08-16 LAB — CBC AND DIFFERENTIAL
HEMATOCRIT: 34 — AB (ref 36–46)
Hemoglobin: 11 — AB (ref 12.0–16.0)
PLATELETS: 447 — AB (ref 150–399)
WBC: 9

## 2017-08-16 NOTE — Progress Notes (Signed)
Location:  [facility] Room Number: 097D Place of Service:  SNF (31)  Alyssa Savage. Alyssa Coil, MD  Patient Care Team: Hennie Duos, MD as PCP - General (Internal Medicine) Minus Breeding, MD as PCP - Cardiology (Cardiology)  Extended Emergency Contact Information Primary Emergency Contact: Lenore Cordia Mobile Phone: [phone] Relation: Son Secondary Emergency Contact: cromati,eartha Home Phone: [phone] Relation: Daughter    Allergies: Patient has no known allergies.  Chief Complaint  Patient presents with  . Medical Management of Chronic Issues    Routine Visit    HPI: Patient is 84 y.o. female who is being seen for routine issues of hypertension, dysphagia, and history of CVA with residual deficit.  Past Medical History:  Diagnosis Date  . Abnormal liver function test   . Cerebral infarct ([place])   . Cognitive communication deficit   . Difficulty walking   . Dysphagia   . Femoral neck fracture ([place]) 03/2017   left   . Gastrostomy status ([place])   . GERD (gastroesophageal reflux disease)   . Hemiparesis ([place])   . Hemiplegia ([place])   . Hyperlipidemia   . Hypertensive emergency 03/2017  . Hypertensive heart disease   . Hypokalemia 03/2017  . Muscle weakness   . Other hyperlipidemia   . Pain in left hip   . Presence of artificial hip, left   . Respiratory symptoms   . Severe protein-calorie malnutrition ([place])   . Stroke [facility])     Past Surgical History:  Procedure Laterality Date  . ANTERIOR APPROACH HEMI HIP ARTHROPLASTY Left 03/25/2017   Procedure: ANTERIOR APPROACH HEMI HIP ARTHROPLASTY;  Surgeon: Rod Can, MD;  Location: [facility];  Service: Orthopedics;  Laterality: Left;  . ESOPHAGOGASTRODUODENOSCOPY (EGD) WITH PROPOFOL N/A 07/20/2017   Procedure: ESOPHAGOGASTRODUODENOSCOPY (EGD) WITH PROPOFOL;  Surgeon: Ronnette Juniper, MD;  Location: [facility];  Service: Gastroenterology;  Laterality: N/A;  . IR GASTROSTOMY TUBE  MOD SED  03/31/2017    Allergies as of 08/16/2017   No Known Allergies     Medication List        Accurate as of 08/16/17 11:59 PM. Always use your most recent med list.          acetaminophen 325 MG tablet Commonly known as:  TYLENOL Place 650 mg into feeding tube every 6 (six) hours as needed for mild pain or moderate pain. Notify MD if not relieved. Special Requirement - Pre-Pain (Physician Order) Not to exceed 3000mg  in 24 hour period   amLODipine 5 MG tablet Commonly known as:  NORVASC Place 5 mg into feeding tube daily.   atorvastatin 40 MG tablet Commonly known as:  LIPITOR Place 1 tablet (40 mg total) into feeding tube daily at 6 PM.   bisacodyl 10 MG suppository Commonly known as:  DULCOLAX Place 1 suppository (10 mg total) rectally daily as needed for moderate constipation.   carboxymethylcellulose 0.5 % Soln Commonly known as:  REFRESH PLUS Place 1 drop into both eyes every 8 (eight) hours as needed (dry eyes).   carvedilol 6.25 MG tablet Commonly known as:  COREG Place 6.25 mg into feeding tube 2 (two) times daily with a meal. HYPERTENSIVE HEART DISEASE WITH HEART FAILURE   cetirizine 10 MG tablet Commonly known as:  ZYRTEC Place 10 mg into feeding tube daily.   cholestyramine 4 g packet Commonly known as:  QUESTRAN Place 4 g into feeding tube 2 (two) times daily. Mixed with 4 ounces of water   diclofenac sodium  1 % Gel Commonly known as:  VOLTAREN Apply 2 g topically 4 (four) times daily.   feeding supplement (OSMOLITE 1.5 CAL) Liqd Place 1,000 mLs into feeding tube 4 (four) times daily. 1425 Kcal and 66g Pro daily ( May cont Osmolite 1.2 until Osmolite 1.5 available)   feeding supplement (PRO-STAT SUGAR FREE 64) Liqd Place 30 mLs into feeding tube 2 (two) times daily.   ferrous sulfate 300 (60 Fe) MG/5ML syrup Take 300 mg by mouth daily. 325mg  daily via peg tube for anemia   free water Soln Place 200 mLs into feeding tube every 6 (six) hours.     magnesium hydroxide 400 MG/5ML suspension Commonly known as:  MILK OF MAGNESIA Place 30 mLs into feeding tube daily as needed for mild constipation.   nitroGLYCERIN 0.4 MG SL tablet Commonly known as:  NITROSTAT Place 1 tablet (0.4 mg total) under the tongue every 5 (five) minutes as needed for chest pain.   PREVALON Misc by Does not apply route. Boots to bilateral feet QD- while in bed-may remove for AM/PM care 7 am on/off 3 pm on/off 11 pm on/off   RA SALINE ENEMA RE Place 1 application rectally as needed (constipation).   ranitidine 150 MG/10ML syrup Commonly known as:  ZANTAC Place 10 mLs (150 mg total) into feeding tube daily.   traMADol 50 MG tablet Commonly known as:  ULTRAM Take 1 tablet (50 mg total) by mouth 3 (three) times daily.       No orders of the defined types were placed in this encounter.    There is no immunization history on file for this patient.  Social History   Tobacco Use  . Smoking status: Never Smoker  . Smokeless tobacco: Never Used  Substance Use Topics  . Alcohol use: No    Frequency: Never    Review of Systems  DATA OBTAINED: from patient-limited due to patient's communication skills; nursing-no acute concerns GENERAL:  no fevers, fatigue, appetite changes SKIN: No itching, rash HEENT: No complaint RESPIRATORY: No cough, wheezing, SOB CARDIAC: No chest pain, palpitations, lower extremity edema  GI: No abdominal pain, No N/V/D or constipation, No heartburn or reflux  GU: No dysuria, frequency or urgency, or incontinence  MUSCULOSKELETAL: No unrelieved bone/joint pain NEUROLOGIC: No headache, dizziness  PSYCHIATRIC: No overt anxiety or sadness  Vitals:   08/16/17 1234  BP: (!) 159/93  Pulse: 89  Resp: 18  Temp: (!) 97.3 F (36.3 C)  SpO2: 100%   Body mass index is 18.95 kg/m. Physical Exam  GENERAL APPEARANCE: Alert, conversant, No acute distress  SKIN: No diaphoresis rash HEENT: Unremarkable RESPIRATORY:  Breathing is even, unlabored. Lung sounds are clear   CARDIOVASCULAR: Heart RRR no murmurs, rubs or gallops. No peripheral edema  GASTROINTESTINAL: Abdomen is soft, non-tender, not distended w/ normal bowel sounds; PEG tube GENITOURINARY: Bladder non tender, not distended  MUSCULOSKELETAL: No abnormal joints or musculature NEUROLOGIC: Cranial nerves 2-12 grossly intact: Right-sided weakness PSYCHIATRIC: Mood and affect appropriate to situation, no behavioral issues  Patient Active Problem List   Diagnosis Date Noted  . Acute metabolic encephalopathy 93/79/0240  . Dysphasia 09/10/2017  . Bronchopneumonia 09/10/2017  . Aspiration pneumonia ([place]) 09/10/2017  . Essential hypertension 09/10/2017  . Esophagitis 07/30/2017  . Acute blood loss anemia 07/30/2017  . Iron deficiency anemia 07/30/2017  . Hypophosphatemia 07/30/2017  . Coronary artery disease 07/30/2017  . Upper GI bleed 07/18/2017  . Anemia   . Hemoptysis 07/16/2017  . Status post insertion of percutaneous  endoscopic gastrostomy (PEG) tube ([place]) 07/16/2017  . Acute hypernatremia 07/09/2017  . Dysphagia as late effect of stroke 07/09/2017  . Cognitive communication deficit 07/09/2017  . History of recurrent UTIs 07/09/2017  . Recurrent Clostridium difficile diarrhea 07/09/2017  . Severe protein-calorie malnutrition ([place]) 07/09/2017  . Hypertensive heart disease/LV Diastolic dysfunction 92/33/0076  . Positive D dimer 07/09/2017  . Troponin level elevated 07/09/2017  . Sacral decubitus ulcer, stage II 07/09/2017  . History of cerebrovascular accident (CVA) with residual deficit 06/26/2017  . Hemiparesis ([place]) 06/26/2017  . Abnormal liver function test 06/26/2017  . Normocytic anemia 06/26/2017  . Aphasia 06/26/2017  . Hyperlipidemia 06/26/2017  . Sacral decubitus ulcer, stage III ([place]) 06/26/2017  . Chronic diarrhea 06/26/2017  . Allergic rhinitis 06/26/2017  . Clostridium difficile diarrhea   . Uterine leiomyoma   .  Rectal bleeding   . Nausea and vomiting 05/28/2017  . Constipation   . Acute CVA (cerebrovascular accident) ([place]) 04/22/2017  . Acute encephalopathy 04/19/2017  . UTI (urinary tract infection) 04/19/2017  . Dysphagia 04/19/2017  . Displaced fracture of left femoral neck ([place]) 03/25/2017  . Protein-calorie malnutrition, severe 03/24/2017  . Fall 03/21/2017  . Fracture of femoral neck, left, closed ([place]) 03/21/2017  . AKI (acute kidney injury) ([place]) 03/21/2017  . Hypertensive emergency 03/21/2017  . Elevated troponin 03/21/2017  . Hypokalemia 03/21/2017  . Stroke ([place]) 03/21/2017  . GERD (gastroesophageal reflux disease)   . Closed fracture of left hip ([facility])     CMP     Component Value Date/Time   NA 141 09/06/2017   K 4.9 09/06/2017   CL 116 (H) 07/21/2017 0825   CO2 23 07/21/2017 0825   GLUCOSE 102 (H) 07/21/2017 0825   BUN 55 (A) 09/06/2017   CREATININE 0.9 09/06/2017   CREATININE 0.63 07/21/2017 0825   CALCIUM 8.4 (L) 07/21/2017 0825   PROT 6.4 (L) 07/21/2017 0825   ALBUMIN 2.5 (L) 07/21/2017 0825   AST 36 07/21/2017 0825   ALT 36 07/21/2017 0825   ALKPHOS 76 07/21/2017 0825   BILITOT 0.9 07/21/2017 0825   GFRNONAA >60 07/21/2017 0825   GFRAA >60 07/21/2017 0825   Recent Labs    03/26/17 0215 03/27/17 0429 03/29/17 0917  07/19/17 0936 07/20/17 0602 07/21/17 0825 09/06/17  NA 140 139 143   < > 146* 145 147* 141  K 3.6 3.4* 3.7   < > 3.3* 3.5 3.2* 4.9  CL 109 109 109   < > 115* 115* 116*  --   CO2 21* 20* 24   < > 23 24 23   --   GLUCOSE 170* 178* 122*   < > 89 94 102*  --   BUN 25* 30* 27*   < > 31* 21* 16 55*  CREATININE 1.41* 1.55* 1.18*   < > 0.72 0.70 0.63 0.9  CALCIUM 7.8* 7.8* 8.4*   < > 8.6* 8.6* 8.4*  --   MG 1.4* 1.9 1.7  --   --   --  1.7  --   PHOS 2.4* 2.2*  --   --   --   --  2.4*  --    < > = values in this interval not displayed.   Recent Labs    07/09/17 1003 07/18/17 1627 07/21/17 0825  AST 25 36 36  ALT 28 35 36  ALKPHOS 70 67 76    BILITOT 0.3 0.3 0.9  PROT 7.1 5.7* 6.4*  ALBUMIN 2.6* 2.3* 2.5*   Recent Labs  07/09/17 1003  07/18/17 1627 07/19/17 0936 07/20/17 0602 07/21/17 0825 09/06/17  WBC 10.4   < > 7.7 7.5 7.6 9.4 9.0  NEUTROABS 7.8*  --  4.9  --   --  7.1  --   HGB 9.6*   < > 7.0* 10.8* 10.6* 11.9* 10.9*  HCT 32.3*   < > 22.7* 33.3* 33.5* 36.8 34*  MCV 90.0   < > 88.7 86.9 87.9 85.8  --   PLT 465*   < > 488* 430* 456* 400 312   < > = values in this interval not displayed.   Recent Labs    03/22/17 0344  CHOL 203*  LDLCALC 117*  TRIG 90   No results found for: MICROALBUR No results found for: TSH Lab Results  Component Value Date   HGBA1C 6.3 (H) 03/22/2017   Lab Results  Component Value Date   CHOL 203 (H) 03/22/2017   HDL 68 03/22/2017   LDLCALC 117 (H) 03/22/2017   TRIG 90 03/22/2017   CHOLHDL 3.0 03/22/2017    Significant Diagnostic Results in last 30 days:  No results found.  Assessment and Plan  Essential hypertension Controlled; continue Norvasc 5 mg and metoprolol 6.25 mg twice daily  Dysphagia as late effect of stroke Status post PEG tube placement; tolerating feedings okay; nothing by mouth  History of cerebrovascular accident (CVA) with residual deficit Residual right-sided weakness with dysphasia and cannot speak but can communicate with gestures in a board; all chronic and stable; continue supportive care: Continue baclofen 20 mg 3 times daily for contractures     Anne D. Alyssa Coil, MD

## 2017-08-16 NOTE — Progress Notes (Signed)
Opened in error

## 2017-08-26 ENCOUNTER — Emergency Department (HOSPITAL_COMMUNITY): Payer: Medicare Other

## 2017-08-26 ENCOUNTER — Emergency Department (HOSPITAL_COMMUNITY)
Admission: EM | Admit: 2017-08-26 | Discharge: 2017-08-27 | Disposition: A | Discharge status: Skilled Nursing Facility | Payer: Medicare Other | Source: Home / Self Care | Attending: Emergency Medicine | Admitting: Emergency Medicine

## 2017-08-26 ENCOUNTER — Other Ambulatory Visit: Payer: Self-pay

## 2017-08-26 ENCOUNTER — Encounter (HOSPITAL_COMMUNITY): Payer: Self-pay

## 2017-08-26 DIAGNOSIS — J690 Pneumonitis due to inhalation of food and vomit: Secondary | ICD-10-CM | POA: Insufficient documentation

## 2017-08-26 DIAGNOSIS — Z7902 Long term (current) use of antithrombotics/antiplatelets: Secondary | ICD-10-CM | POA: Insufficient documentation

## 2017-08-26 DIAGNOSIS — R1111 Vomiting without nausea: Secondary | ICD-10-CM

## 2017-08-26 DIAGNOSIS — Z79899 Other long term (current) drug therapy: Secondary | ICD-10-CM | POA: Insufficient documentation

## 2017-08-26 DIAGNOSIS — N179 Acute kidney failure, unspecified: Secondary | ICD-10-CM | POA: Diagnosis not present

## 2017-08-26 LAB — URINALYSIS, ROUTINE W REFLEX MICROSCOPIC
BILIRUBIN URINE: NEGATIVE
Glucose, UA: NEGATIVE mg/dL
Hgb urine dipstick: NEGATIVE
Ketones, ur: NEGATIVE mg/dL
Leukocytes, UA: NEGATIVE
Nitrite: NEGATIVE
Protein, ur: NEGATIVE mg/dL
Specific Gravity, Urine: 1.012 (ref 1.005–1.030)
pH: 7 (ref 5.0–8.0)

## 2017-08-26 LAB — COMPREHENSIVE METABOLIC PANEL
ALT: 31 U/L (ref 0–44)
AST: 34 U/L (ref 15–41)
Albumin: 3.3 g/dL — ABNORMAL LOW (ref 3.5–5.0)
Alkaline Phosphatase: 102 U/L (ref 38–126)
Anion gap: 11 (ref 5–15)
BILIRUBIN TOTAL: 0.6 mg/dL (ref 0.3–1.2)
BUN: 50 mg/dL — AB (ref 8–23)
CO2: 26 mmol/L (ref 22–32)
CREATININE: 0.91 mg/dL (ref 0.44–1.00)
Calcium: 10.2 mg/dL (ref 8.9–10.3)
Chloride: 103 mmol/L (ref 98–111)
GFR calc Af Amer: >60 mL/min (ref >60)
GFR calc non Af Amer: 56 mL/min — ABNORMAL LOW (ref >60)
Glucose, Bld: 123 mg/dL — ABNORMAL HIGH (ref 70–99)
Potassium: 5.5 mmol/L — ABNORMAL HIGH (ref 3.5–5.1)
Sodium: 140 mmol/L (ref 135–145)
TOTAL PROTEIN: 8.3 g/dL — AB (ref 6.5–8.1)

## 2017-08-26 LAB — CBC
HCT: 37.9 % (ref 36.0–46.0)
Hemoglobin: 11.8 g/dL — ABNORMAL LOW (ref 12.0–15.0)
MCH: 27.1 pg (ref 26.0–34.0)
MCHC: 31.1 g/dL (ref 30.0–36.0)
MCV: 87.1 fL (ref 78.0–100.0)
Platelets: 451 10*3/uL — ABNORMAL HIGH (ref 150–400)
RBC: 4.35 MIL/uL (ref 3.87–5.11)
RDW: 16.2 % — AB (ref 11.5–15.5)
WBC: 10.9 10*3/uL — ABNORMAL HIGH (ref 4.0–10.5)

## 2017-08-26 LAB — LIPASE, BLOOD: Lipase: 158 U/L — ABNORMAL HIGH (ref 11–51)

## 2017-08-26 MED ORDER — IOPAMIDOL (ISOVUE-300) INJECTION 61%
100.0000 mL | Freq: Once | INTRAVENOUS | Status: AC | PRN
  Administered 2017-08-26: 100 mL via INTRAVENOUS

## 2017-08-26 MED ORDER — ONDANSETRON HCL 4 MG/2ML IJ SOLN
4.0000 mg | Freq: Once | INTRAMUSCULAR | Status: AC | PRN
  Administered 2017-08-26: 4 mg via INTRAVENOUS
  Filled 2017-08-26: qty 2

## 2017-08-26 MED ORDER — SODIUM CHLORIDE 0.9 % IV BOLUS
500.0000 mL | Freq: Once | INTRAVENOUS | Status: AC
  Administered 2017-08-26: 500 mL via INTRAVENOUS

## 2017-08-26 MED ORDER — IOPAMIDOL (ISOVUE-300) INJECTION 61%
INTRAVENOUS | Status: AC
  Filled 2017-08-26: qty 100

## 2017-08-26 MED ORDER — SODIUM CHLORIDE 0.9 % IV BOLUS
1000.0000 mL | Freq: Once | INTRAVENOUS | Status: AC
  Administered 2017-08-26: 1000 mL via INTRAVENOUS

## 2017-08-26 NOTE — ED Triage Notes (Addendum)
Per EMS, patient from [facility], reports vomiting and lethargy today. Non-verbal at baseline. Started Baclofen for leg pain yesterday. Hx of stroke and feeding tube.   BP 157/99 CBG 132 96% room air  20G LW

## 2017-08-26 NOTE — ED Notes (Signed)
Bed: WA01 Expected date:  Expected time:  Means of arrival:  Comments: EMS vomiting 

## 2017-08-26 NOTE — ED Notes (Signed)
Daughter at bedside.

## 2017-08-26 NOTE — ED Notes (Signed)
Patient transported to CT 

## 2017-08-26 NOTE — ED Notes (Signed)
Pure wick placed to obtain urine specimen 

## 2017-08-26 NOTE — ED Provider Notes (Signed)
[facility] DEPT Provider Note   CSN: [id number] Arrival date & time: 08/26/17  1434     History   Chief Complaint Chief Complaint  Patient presents with  . Emesis    HPI Alyssa Savage is a 84 y.o. female.  The history is provided by the nursing home. The history is limited by the condition of the patient.  Emesis   This is a new problem. The current episode started 12 to 24 hours ago. The problem occurs 5 to 10 times per day. The problem has not changed since onset.There has been no fever. Pertinent negatives include no fever.  Pt's family reports they were called from nursing home because pt was vomiting.  Family reports pt less responsive.  Pt normally responds to thumbs up and thumbs down.  Pt does not respond to familys questions now.   Past Medical History:  Diagnosis Date  . Stroke [facility])     There are no active problems to display for this patient.   History reviewed. No pertinent surgical history.   OB History   None      Home Medications    Prior to Admission medications   Medication Sig Start Date End Date Taking? Authorizing Provider  acetaminophen (TYLENOL) 160 MG/5ML liquid Take 640 mg by mouth every 4 (four) hours as needed for fever.   Yes [provider]  Amino Acids-Protein Hydrolys (FEEDING SUPPLEMENT, PRO-STAT SUGAR FREE 64,) LIQD Take 30 mLs by mouth 2 (two) times daily.   Yes [provider]  amLODipine (NORVASC) 5 MG tablet Place 5 mg into feeding tube daily.   Yes [provider]  atorvastatin (LIPITOR) 40 MG tablet Take 40 mg by mouth daily. 08/07/17  Yes [provider]  bisacodyl (DULCOLAX) 10 MG suppository Place 10 mg rectally as needed for moderate constipation.   Yes [provider]  Carboxymethylcellulose Sod PF (REFRESH PLUS) 0.5 % SOLN Place 1 drop into both eyes every 8 (eight) hours as needed (dry eyes).   Yes [provider]  carvedilol (COREG) 6.25 MG  tablet Take 6.25 mg by mouth 2 (two) times daily. 08/15/17  Yes [provider]  cetirizine (ZYRTEC) 10 MG tablet Place 10 mg into feeding tube daily.   Yes [provider]  cholestyramine (QUESTRAN) 4 g packet  08/15/17  Yes [provider]  clopidogrel (PLAVIX) 75 MG tablet Place 75 mg into feeding tube daily.  08/15/17  Yes [provider]  ferrous sulfate 300 (60 Fe) MG/5ML syrup Place 325 mg into feeding tube daily.   Yes [provider]  magnesium hydroxide (MILK OF MAGNESIA) 400 MG/5ML suspension Take 30 mLs by mouth daily as needed for mild constipation.   Yes [provider]  nitroGLYCERIN (NITROSTAT) 0.4 MG SL tablet Place 0.4 mg under the tongue every 5 (five) minutes as needed for chest pain.   Yes [provider]  Nutritional Supplements (FEEDING SUPPLEMENT, OSMOLITE 1.5 CAL,) LIQD Place 1,000 mLs into feeding tube continuous.   Yes [provider]  PROTONIX 40 MG PACK Place 40 mg into feeding tube daily.  08/12/17  Yes [provider]  ranitidine (ZANTAC) 150 MG/10ML syrup Place 150 mg into feeding tube daily.   Yes [provider]  saccharomyces boulardii (FLORASTOR) 250 MG capsule Place 250 mg into feeding tube daily.   Yes [provider]  Sodium Phosphates (RA SALINE ENEMA RE) Place 1 application rectally daily as needed (constipation).   Yes [provider]  traMADol (ULTRAM) 50 MG tablet Take 50 mg by mouth 3 (three) times daily. For pain 08/24/17  Yes [provider]  VOLTAREN 1 % GEL Apply 2 mg topically 4 (four) times daily. For pain 08/22/17  Yes [provider]  Water For Irrigation, Sterile (FREE WATER) SOLN Place 200 mLs into feeding tube every 6 (six) hours.   Yes [provider]    Family History History reviewed. No pertinent family history.  Social History Social History   Tobacco Use  . Smoking status: Unknown If Ever Smoked    Substance Use Topics  . Alcohol use: Not Currently  . Drug use: Not Currently     Allergies   Patient has no known allergies.   Review of Systems Review of Systems  Constitutional: Negative for fever.  Gastrointestinal: Positive for vomiting.  All other systems reviewed and are negative.    Physical Exam Updated Vital Signs BP (!) 151/104   Pulse (!) 106   Temp (!) 97.5 F (36.4 C) (Axillary)   Resp 13   SpO2 95%   Physical Exam  Constitutional: She appears well-developed and well-nourished.  HENT:  Head: Normocephalic.  Right Ear: External ear normal.  Left Ear: External ear normal.  Eyes: Pupils are equal, round, and reactive to light. Conjunctivae are normal.  Neck: Normal range of motion.  Cardiovascular: Normal rate.  Pulmonary/Chest: Effort normal.  Abdominal: Soft.  Musculoskeletal: Normal range of motion.  Contracted right side.   Neurological: She is alert.  Eyes open, makes eye contact with daughter   Skin: Skin is warm.  Nursing note and vitals reviewed.    ED Treatments / Results  Labs (all labs ordered are listed, but only abnormal results are displayed) Labs Reviewed  LIPASE, BLOOD - Abnormal; Notable for the following components:      Result Value   Lipase 158 (*)    All other components within normal limits  COMPREHENSIVE METABOLIC PANEL - Abnormal; Notable for the following components:   Potassium 5.5 (*)    Glucose, Bld 123 (*)    BUN 50 (*)    Total Protein 8.3 (*)    Albumin 3.3 (*)    GFR calc non Af Amer 56 (*)    All other components within normal limits  CBC - Abnormal; Notable for the following components:   WBC 10.9 (*)    Hemoglobin 11.8 (*)    RDW 16.2 (*)    Platelets 451 (*)    All other components within normal limits  URINALYSIS, ROUTINE W REFLEX MICROSCOPIC    EKG None  Radiology Ct Abdomen Pelvis W Contrast  Result Date: 08/26/2017 CLINICAL DATA:  Vomiting and lethargy today. History of stroke and  feeding tube. EXAM: CT ABDOMEN AND PELVIS WITH CONTRAST TECHNIQUE: Multidetector CT imaging of the abdomen and pelvis was performed using the standard protocol following bolus administration of intravenous contrast. CONTRAST:  120mL ISOVUE-300 IOPAMIDOL (ISOVUE-300) INJECTION 61% COMPARISON:  None. FINDINGS: Lower chest: Lung bases demonstrate peribronchial thickening with peribronchial tree-in-bud infiltrates likely representing bronchopneumonia. Cardiac enlargement. Small esophageal hiatal hernia. Hepatobiliary: No focal liver lesions. Gallbladder is unremarkable. No bile duct dilatation, but there is a 4 mm stone in the distal common bile duct. Pancreas: Unremarkable. No pancreatic ductal dilatation or surrounding inflammatory changes. Spleen: Normal in size without focal abnormality. Adrenals/Urinary Tract: No adrenal gland nodules. Multiple cysts in the kidneys. Nephrograms are symmetrical. No hydronephrosis or hydroureter. Bladder appears intact but visualization is limited due  to streak artifact from hip arthroplasty. Stomach/Bowel: Stomach, small bowel, and colon are not abnormally distended. Gastrostomy tube along the greater curvature of the stomach. Diffusely stool-filled colon. Prominent stool in the rectum without rectal wall thickening. No definite bowel inflammatory changes. Appendix is not identified. Vascular/Lymphatic: Aortic atherosclerosis. No enlarged abdominal or pelvic lymph nodes. Inferior vena cava is somewhat decompressed suggesting hypovolemia. Reproductive: Marked enlargement of the uterus with multiple calcifications consistent with large uterine fibroids. No adnexal masses are appreciated. Other: No free air or free fluid in the abdomen. Abdominal wall musculature appears intact. Musculoskeletal: Left hip hemiarthroplasty. No destructive bone lesions. IMPRESSION: 1. 4 mm stone in the distal common bile duct without proximal obstruction. 2. No evidence of bowel obstruction or  inflammation. Prominent stool-filled colon and rectum. 3. Marked enlargement of the uterus with multiple calcifications consistent with large uterine fibroids. 4. Flattening of the inferior vena cava suggest hypovolemia. 5. Peribronchial thickening with peribronchial tree in bud infiltrates likely representing bronchopneumonia. 6. Aortic atherosclerosis. Electronically Signed   By: Lucienne Capers M.D.   On: 08/26/2017 22:51   Dg Abd Acute W/chest  Result Date: 08/26/2017 CLINICAL DATA:  Vomiting. EXAM: DG ABDOMEN ACUTE W/ 1V CHEST COMPARISON:  None. FINDINGS: Gastrostomy tube is in grossly good position. Large amount of stool seen throughout the colon. No free air is noted to suggest pneumoperitoneum. No definite evidence of abnormal bowel dilatation. Heart size and mediastinal contours are within normal limits. Both lungs are clear. IMPRESSION: Gastrostomy tube in grossly good position. Large amount of stool seen throughout the colon suggesting constipation, with residual contrast seen in the sigmoid colon. No acute cardiopulmonary disease. Electronically Signed   By: Marijo Conception, M.D.   On: 08/26/2017 17:18    Procedures Procedures (including critical care time)  Medications Ordered in ED Medications  iopamidol (ISOVUE-300) 61 % injection (has no administration in time range)  sodium chloride 0.9 % bolus 1,000 mL (1,000 mLs Intravenous New Bag/Given 08/26/17 2311)  ondansetron (ZOFRAN) injection 4 mg (4 mg Intravenous Given 08/26/17 1532)  sodium chloride 0.9 % bolus 500 mL (0 mLs Intravenous Stopped 08/26/17 2219)  iopamidol (ISOVUE-300) 61 % injection 100 mL (100 mLs Intravenous Contrast Given 08/26/17 2221)     Initial Impression / Assessment and Plan / ED Course  I have reviewed the triage vital signs and the nursing notes.  Pertinent labs & imaging results that were available during my care of the patient were reviewed by me and considered in my medical decision making (see chart for  details).     Dr. Wilson Singer in to see and examine.   Rx for levaquin.   Final Clinical Impressions(s) / ED Diagnoses   Final diagnoses:  Vomiting without nausea, intractability of vomiting not specified, unspecified vomiting type  Aspiration pneumonia, unspecified aspiration pneumonia type, unspecified laterality, unspecified part of lung [facility])    ED Discharge Orders        Ordered    levofloxacin (LEVAQUIN) 25 MG/ML solution  Daily     08/27/17 0024    An After Visit Summary was printed and given to the patient.    Fransico Meadow, [place] 08/27/17 2956    Virgel Manifold, MD 09/02/17 1452

## 2017-08-26 NOTE — ED Notes (Signed)
Patient vomited at 1450, after EMS roomed her. RN made aware.

## 2017-08-26 NOTE — ED Notes (Signed)
Patient transported to X-ray 

## 2017-08-27 MED ORDER — LEVOFLOXACIN 500 MG PO TABS
500.0000 mg | ORAL_TABLET | Freq: Once | ORAL | Status: AC
  Administered 2017-08-27: 500 mg via ORAL
  Filled 2017-08-27: qty 1

## 2017-08-27 MED ORDER — LEVOFLOXACIN 25 MG/ML PO SOLN: 500.0000 mg | Freq: Every day | ORAL | Status: DC

## 2017-08-27 MED ORDER — LEVOFLOXACIN 25 MG/ML PO SOLN: 500.0000 mg | Freq: Every day | ORAL | 0 refills | Status: DC

## 2017-08-27 NOTE — Discharge Instructions (Signed)
Return if any problems.

## 2017-08-28 ENCOUNTER — Other Ambulatory Visit: Payer: Self-pay

## 2017-08-28 ENCOUNTER — Inpatient Hospital Stay (HOSPITAL_COMMUNITY)
Admission: EM | Admit: 2017-08-28 | Discharge: 2017-09-01 | DRG: 682 | Disposition: A | Discharge status: Skilled Nursing Facility | Payer: Medicare Other | Attending: Family Medicine | Admitting: Family Medicine

## 2017-08-28 ENCOUNTER — Emergency Department (HOSPITAL_COMMUNITY): Payer: Medicare Other

## 2017-08-28 ENCOUNTER — Encounter (HOSPITAL_COMMUNITY): Payer: Self-pay

## 2017-08-28 DIAGNOSIS — J690 Pneumonitis due to inhalation of food and vomit: Secondary | ICD-10-CM | POA: Diagnosis present

## 2017-08-28 DIAGNOSIS — E870 Hyperosmolality and hypernatremia: Secondary | ICD-10-CM | POA: Diagnosis not present

## 2017-08-28 DIAGNOSIS — E785 Hyperlipidemia, unspecified: Secondary | ICD-10-CM | POA: Diagnosis present

## 2017-08-28 DIAGNOSIS — Z7401 Bed confinement status: Secondary | ICD-10-CM

## 2017-08-28 DIAGNOSIS — Z7189 Other specified counseling: Secondary | ICD-10-CM

## 2017-08-28 DIAGNOSIS — J180 Bronchopneumonia, unspecified organism: Secondary | ICD-10-CM | POA: Diagnosis not present

## 2017-08-28 DIAGNOSIS — R109 Unspecified abdominal pain: Secondary | ICD-10-CM

## 2017-08-28 DIAGNOSIS — K5900 Constipation, unspecified: Secondary | ICD-10-CM

## 2017-08-28 DIAGNOSIS — I69351 Hemiplegia and hemiparesis following cerebral infarction affecting right dominant side: Secondary | ICD-10-CM

## 2017-08-28 DIAGNOSIS — Z515 Encounter for palliative care: Secondary | ICD-10-CM

## 2017-08-28 DIAGNOSIS — E440 Moderate protein-calorie malnutrition: Secondary | ICD-10-CM

## 2017-08-28 DIAGNOSIS — K5641 Fecal impaction: Secondary | ICD-10-CM | POA: Diagnosis present

## 2017-08-28 DIAGNOSIS — I10 Essential (primary) hypertension: Secondary | ICD-10-CM

## 2017-08-28 DIAGNOSIS — L89151 Pressure ulcer of sacral region, stage 1: Secondary | ICD-10-CM | POA: Diagnosis present

## 2017-08-28 DIAGNOSIS — Z6820 Body mass index (BMI) 20.0-20.9, adult: Secondary | ICD-10-CM

## 2017-08-28 DIAGNOSIS — N179 Acute kidney failure, unspecified: Principal | ICD-10-CM | POA: Diagnosis present

## 2017-08-28 DIAGNOSIS — I639 Cerebral infarction, unspecified: Secondary | ICD-10-CM

## 2017-08-28 DIAGNOSIS — Z931 Gastrostomy status: Secondary | ICD-10-CM

## 2017-08-28 DIAGNOSIS — K648 Other hemorrhoids: Secondary | ICD-10-CM | POA: Diagnosis present

## 2017-08-28 DIAGNOSIS — Z96642 Presence of left artificial hip joint: Secondary | ICD-10-CM | POA: Diagnosis present

## 2017-08-28 DIAGNOSIS — E860 Dehydration: Secondary | ICD-10-CM | POA: Diagnosis present

## 2017-08-28 DIAGNOSIS — Z7902 Long term (current) use of antithrombotics/antiplatelets: Secondary | ICD-10-CM

## 2017-08-28 DIAGNOSIS — I69320 Aphasia following cerebral infarction: Secondary | ICD-10-CM

## 2017-08-28 DIAGNOSIS — K219 Gastro-esophageal reflux disease without esophagitis: Secondary | ICD-10-CM | POA: Diagnosis present

## 2017-08-28 DIAGNOSIS — K859 Acute pancreatitis without necrosis or infection, unspecified: Secondary | ICD-10-CM | POA: Diagnosis present

## 2017-08-28 DIAGNOSIS — E861 Hypovolemia: Secondary | ICD-10-CM | POA: Diagnosis present

## 2017-08-28 LAB — CBC WITH DIFFERENTIAL/PLATELET
BASOS PCT: 0 %
Basophils Absolute: 0 10*3/uL (ref 0.0–0.1)
EOS ABS: 0.3 10*3/uL (ref 0.0–0.7)
EOS PCT: 4 %
HEMATOCRIT: 42.8 % (ref 36.0–46.0)
Hemoglobin: 13.6 g/dL (ref 12.0–15.0)
Lymphocytes Relative: 23 %
Lymphs Abs: 1.8 10*3/uL (ref 0.7–4.0)
MCH: 27.3 pg (ref 26.0–34.0)
MCHC: 31.8 g/dL (ref 30.0–36.0)
MCV: 85.9 fL (ref 78.0–100.0)
MONO ABS: 0.6 10*3/uL (ref 0.1–1.0)
MONOS PCT: 8 %
Neutro Abs: 5.3 10*3/uL (ref 1.7–7.7)
Neutrophils Relative %: 65 %
PLATELETS: 426 10*3/uL — AB (ref 150–400)
RBC: 4.98 MIL/uL (ref 3.87–5.11)
RDW: 16.5 % — AB (ref 11.5–15.5)
WBC: 8 10*3/uL (ref 4.0–10.5)

## 2017-08-28 LAB — COMPREHENSIVE METABOLIC PANEL
ALBUMIN: 3.5 g/dL (ref 3.5–5.0)
ALK PHOS: 87 U/L (ref 38–126)
ALT: 29 U/L (ref 0–44)
AST: 32 U/L (ref 15–41)
Anion gap: 11 (ref 5–15)
BILIRUBIN TOTAL: 0.4 mg/dL (ref 0.3–1.2)
BUN: 56 mg/dL — AB (ref 8–23)
CALCIUM: 10.2 mg/dL (ref 8.9–10.3)
CO2: 26 mmol/L (ref 22–32)
CREATININE: 1.06 mg/dL — AB (ref 0.44–1.00)
Chloride: 111 mmol/L (ref 98–111)
GFR calc Af Amer: 54 mL/min — ABNORMAL LOW (ref >60)
GFR, EST NON AFRICAN AMERICAN: 47 mL/min — AB (ref >60)
GLUCOSE: 130 mg/dL — AB (ref 70–99)
Potassium: 4.1 mmol/L (ref 3.5–5.1)
Sodium: 148 mmol/L — ABNORMAL HIGH (ref 135–145)
TOTAL PROTEIN: 8.9 g/dL — AB (ref 6.5–8.1)

## 2017-08-28 LAB — URINALYSIS, ROUTINE W REFLEX MICROSCOPIC
Bacteria, UA: NONE SEEN
Bilirubin Urine: NEGATIVE
GLUCOSE, UA: NEGATIVE mg/dL
Hgb urine dipstick: NEGATIVE
KETONES UR: NEGATIVE mg/dL
Leukocytes, UA: NEGATIVE
NITRITE: NEGATIVE
PH: 6 (ref 5.0–8.0)
Protein, ur: 30 mg/dL — AB
SPECIFIC GRAVITY, URINE: 1.017 (ref 1.005–1.030)

## 2017-08-28 LAB — LIPASE, BLOOD: Lipase: 89 U/L — ABNORMAL HIGH (ref 11–51)

## 2017-08-28 LAB — POC OCCULT BLOOD, ED: Fecal Occult Bld: NEGATIVE

## 2017-08-28 MED ORDER — CARVEDILOL 6.25 MG PO TABS
6.2500 mg | ORAL_TABLET | Freq: Two times a day (BID) | ORAL | Status: DC
  Administered 2017-08-28 – 2017-09-01 (×8): 6.25 mg
  Filled 2017-08-28 (×8): qty 1

## 2017-08-28 MED ORDER — ENOXAPARIN SODIUM 40 MG/0.4ML ~~LOC~~ SOLN
40.0000 mg | SUBCUTANEOUS | Status: DC
  Administered 2017-08-29 – 2017-09-01 (×4): 40 mg via SUBCUTANEOUS
  Filled 2017-08-28 (×4): qty 0.4

## 2017-08-28 MED ORDER — SODIUM CHLORIDE 0.9 % IV BOLUS
1000.0000 mL | Freq: Once | INTRAVENOUS | Status: AC
  Administered 2017-08-28: 1000 mL via INTRAVENOUS

## 2017-08-28 MED ORDER — FREE WATER
200.0000 mL | Freq: Four times a day (QID) | Status: DC
  Administered 2017-08-28 – 2017-09-01 (×16): 200 mL

## 2017-08-28 MED ORDER — RANITIDINE HCL 150 MG/10ML PO SYRP
150.0000 mg | ORAL_SOLUTION | Freq: Every day | ORAL | Status: DC
  Administered 2017-08-29 – 2017-09-01 (×4): 150 mg
  Filled 2017-08-28 (×4): qty 10

## 2017-08-28 MED ORDER — ATORVASTATIN CALCIUM 40 MG PO TABS
40.0000 mg | ORAL_TABLET | Freq: Every day | ORAL | Status: DC
  Administered 2017-08-28 – 2017-09-01 (×5): 40 mg
  Filled 2017-08-28 (×5): qty 1

## 2017-08-28 MED ORDER — NITROGLYCERIN 0.4 MG SL SUBL: 0.4000 mg | SUBLINGUAL_TABLET | SUBLINGUAL | Status: DC | PRN

## 2017-08-28 MED ORDER — MAGNESIUM HYDROXIDE 400 MG/5ML PO SUSP: 30.0000 mL | Freq: Every day | ORAL | Status: DC | PRN

## 2017-08-28 MED ORDER — PANTOPRAZOLE SODIUM 40 MG PO PACK
40.0000 mg | PACK | Freq: Two times a day (BID) | ORAL | Status: DC
  Administered 2017-08-28 – 2017-09-01 (×8): 40 mg
  Filled 2017-08-28 (×9): qty 20

## 2017-08-28 MED ORDER — LORATADINE 10 MG PO TABS: 10.0000 mg | ORAL_TABLET | Freq: Every day | ORAL | Status: DC

## 2017-08-28 MED ORDER — LEVOFLOXACIN 500 MG PO TABS
500.0000 mg | ORAL_TABLET | Freq: Every day | ORAL | Status: DC
Start: 2017-08-29 — End: 2017-08-29
  Filled 2017-08-28: qty 1

## 2017-08-28 MED ORDER — PRO-STAT SUGAR FREE PO LIQD
30.0000 mL | Freq: Two times a day (BID) | ORAL | Status: DC
  Administered 2017-08-28 – 2017-09-01 (×8): 30 mL
  Filled 2017-08-28 (×8): qty 30

## 2017-08-28 MED ORDER — CLOPIDOGREL BISULFATE 75 MG PO TABS
75.0000 mg | ORAL_TABLET | Freq: Every day | ORAL | Status: DC
  Administered 2017-08-29 – 2017-09-01 (×4): 75 mg
  Filled 2017-08-28 (×4): qty 1

## 2017-08-28 MED ORDER — OSMOLITE 1.5 CAL PO LIQD
237.0000 mL | Freq: Four times a day (QID) | ORAL | Status: DC
  Administered 2017-08-28 – 2017-09-01 (×16): 237 mL
  Filled 2017-08-28 (×17): qty 237

## 2017-08-28 MED ORDER — BACLOFEN 20 MG PO TABS
20.0000 mg | ORAL_TABLET | Freq: Three times a day (TID) | ORAL | Status: DC
  Administered 2017-08-28 – 2017-09-01 (×12): 20 mg
  Filled 2017-08-28 (×12): qty 1

## 2017-08-28 MED ORDER — POLYVINYL ALCOHOL 1.4 % OP SOLN: 1.0000 [drp] | Freq: Three times a day (TID) | OPHTHALMIC | Status: DC | PRN

## 2017-08-28 MED ORDER — BISACODYL 10 MG RE SUPP
10.0000 mg | RECTAL | Status: DC | PRN
Start: 2017-08-28 — End: 2017-08-30

## 2017-08-28 MED ORDER — AMLODIPINE BESYLATE 5 MG PO TABS
5.0000 mg | ORAL_TABLET | Freq: Every day | ORAL | Status: DC
  Administered 2017-08-28 – 2017-09-01 (×5): 5 mg
  Filled 2017-08-28 (×5): qty 1

## 2017-08-28 MED ORDER — LORATADINE 10 MG PO TABS
10.0000 mg | ORAL_TABLET | Freq: Every day | ORAL | Status: DC
  Administered 2017-08-29 – 2017-09-01 (×4): 10 mg
  Filled 2017-08-28 (×4): qty 1

## 2017-08-28 MED ORDER — SACCHAROMYCES BOULARDII 250 MG PO CAPS
250.0000 mg | ORAL_CAPSULE | Freq: Every day | ORAL | Status: DC
  Administered 2017-08-29 – 2017-09-01 (×4): 250 mg
  Filled 2017-08-28 (×4): qty 1

## 2017-08-28 MED ORDER — ONDANSETRON HCL 4 MG/2ML IJ SOLN
4.0000 mg | Freq: Once | INTRAMUSCULAR | Status: AC
  Administered 2017-08-28: 4 mg via INTRAVENOUS
  Filled 2017-08-28: qty 2

## 2017-08-28 MED ORDER — SODIUM CHLORIDE 0.9 % IV SOLN
INTRAVENOUS | Status: DC
  Administered 2017-08-28 – 2017-08-30 (×3): via INTRAVENOUS

## 2017-08-28 NOTE — ED Notes (Signed)
ED TO INPATIENT HANDOFF REPORT  Name/Age/Gender Alyssa Savage 84 y.o. female  Code Status   Home/SNF/Other Nursing Home  Chief Complaint constipation  Level of Care/Admitting Diagnosis ED Disposition    ED Disposition Condition Comment   Admit  Hospital Area: [place] [[id number]]  Level of Care: Med-Surg [16]  Diagnosis: AKI (acute kidney injury) [facility]) [244010]  Admitting Physician: Hosie Poisson [[id number]]  Attending Physician: Hosie Poisson [[id number]]  PT Class (Do Not Modify): Observation [104]  PT Acc Code (Do Not Modify): Observation [10022]       Medical History Past Medical History:  Diagnosis Date  . Stroke [facility])     Allergies No Known Allergies  IV Location/Drains/Wounds Patient Lines/Drains/Airways Status   Active Line/Drains/Airways    Name:   Placement date:   Placement time:   Site:   Days:   Peripheral IV 08/28/17 Right Forearm   08/28/17    1410    Forearm   less than 1          Labs/Imaging Results for orders placed or performed during the hospital encounter of 08/28/17 (from the past 48 hour(s))  Comprehensive metabolic panel     Status: Abnormal   Collection Time: 08/28/17  3:28 PM  Result Value Ref Range   Sodium 148 (H) 135 - 145 mmol/L    Comment: DELTA CHECK NOTED NO VISIBLE HEMOLYSIS    Potassium 4.1 3.5 - 5.1 mmol/L    Comment: DELTA CHECK NOTED   Chloride 111 98 - 111 mmol/L   CO2 26 22 - 32 mmol/L   Glucose, Bld 130 (H) 70 - 99 mg/dL   BUN 56 (H) 8 - 23 mg/dL   Creatinine, Ser 1.06 (H) 0.44 - 1.00 mg/dL   Calcium 10.2 8.9 - 10.3 mg/dL   Total Protein 8.9 (H) 6.5 - 8.1 g/dL   Albumin 3.5 3.5 - 5.0 g/dL   AST 32 15 - 41 U/L   ALT 29 0 - 44 U/L   Alkaline Phosphatase 87 38 - 126 U/L   Total Bilirubin 0.4 0.3 - 1.2 mg/dL   GFR calc non Af Amer 47 (L) >60 mL/min   GFR calc Af Amer 54 (L) >60 mL/min    Comment: (NOTE) The eGFR has been calculated using the CKD EPI equation. This calculation has not been  validated in all clinical situations. eGFR's persistently <60 mL/min signify possible Chronic Kidney Disease.    Anion gap 11 5 - 15    Comment: Performed at [facility], [place] [address], [place], [place] [address]  Lipase, blood     Status: Abnormal   Collection Time: 08/28/17  3:28 PM  Result Value Ref Range   Lipase 89 (H) 11 - 51 U/L    Comment: Performed at [facility], [place] [address] [place], [place] [address]  CBC with Differential     Status: Abnormal   Collection Time: 08/28/17  3:28 PM  Result Value Ref Range   WBC 8.0 4.0 - 10.5 K/uL   RBC 4.98 3.87 - 5.11 MIL/uL   Hemoglobin 13.6 12.0 - 15.0 g/dL   HCT 42.8 36.0 - 46.0 %   MCV 85.9 78.0 - 100.0 fL   MCH 27.3 26.0 - 34.0 pg   MCHC 31.8 30.0 - 36.0 g/dL   RDW 16.5 (H) 11.5 - 15.5 %   Platelets 426 (H) 150 - 400 K/uL   Neutrophils Relative % 65 %   Neutro Abs 5.3 1.7 -  7.7 K/uL   Lymphocytes Relative 23 %   Lymphs Abs 1.8 0.7 - 4.0 K/uL   Monocytes Relative 8 %   Monocytes Absolute 0.6 0.1 - 1.0 K/uL   Eosinophils Relative 4 %   Eosinophils Absolute 0.3 0.0 - 0.7 K/uL   Basophils Relative 0 %   Basophils Absolute 0.0 0.0 - 0.1 K/uL    Comment: Performed at [facility], [place] [address], [place], [place] [address]  Urinalysis, Routine w reflex microscopic     Status: Abnormal   Collection Time: 08/28/17  5:50 PM  Result Value Ref Range   Color, Urine YELLOW YELLOW   APPearance CLEAR CLEAR   Specific Gravity, Urine 1.017 1.005 - 1.030   pH 6.0 5.0 - 8.0   Glucose, UA NEGATIVE NEGATIVE mg/dL   Hgb urine dipstick NEGATIVE NEGATIVE   Bilirubin Urine NEGATIVE NEGATIVE   Ketones, ur NEGATIVE NEGATIVE mg/dL   Protein, ur 30 (A) NEGATIVE mg/dL   Nitrite NEGATIVE NEGATIVE   Leukocytes, UA NEGATIVE NEGATIVE   RBC / HPF 6-10 0 - 5 RBC/hpf   WBC, UA 0-5 0 - 5 WBC/hpf   Bacteria, UA NONE SEEN NONE SEEN   Squamous Epithelial / LPF 0-5 0 - 5   Mucus PRESENT     Hyphae Yeast PRESENT     Comment: Performed at [facility], [place] [address], [place], [place] [address]  POC occult blood, ED     Status: None   Collection Time: 08/28/17  6:07 PM  Result Value Ref Range   Fecal Occult Bld NEGATIVE NEGATIVE   Ct Abdomen Pelvis W Contrast  Result Date: 08/26/2017 CLINICAL DATA:  Vomiting and lethargy today. History of stroke and feeding tube. EXAM: CT ABDOMEN AND PELVIS WITH CONTRAST TECHNIQUE: Multidetector CT imaging of the abdomen and pelvis was performed using the standard protocol following bolus administration of intravenous contrast. CONTRAST:  184m ISOVUE-300 IOPAMIDOL (ISOVUE-300) INJECTION 61% COMPARISON:  None. FINDINGS: Lower chest: Lung bases demonstrate peribronchial thickening with peribronchial tree-in-bud infiltrates likely representing bronchopneumonia. Cardiac enlargement. Small esophageal hiatal hernia. Hepatobiliary: No focal liver lesions. Gallbladder is unremarkable. No bile duct dilatation, but there is a 4 mm stone in the distal common bile duct. Pancreas: Unremarkable. No pancreatic ductal dilatation or surrounding inflammatory changes. Spleen: Normal in size without focal abnormality. Adrenals/Urinary Tract: No adrenal gland nodules. Multiple cysts in the kidneys. Nephrograms are symmetrical. No hydronephrosis or hydroureter. Bladder appears intact but visualization is limited due to streak artifact from hip arthroplasty. Stomach/Bowel: Stomach, small bowel, and colon are not abnormally distended. Gastrostomy tube along the greater curvature of the stomach. Diffusely stool-filled colon. Prominent stool in the rectum without rectal wall thickening. No definite bowel inflammatory changes. Appendix is not identified. Vascular/Lymphatic: Aortic atherosclerosis. No enlarged abdominal or pelvic lymph nodes. Inferior vena cava is somewhat decompressed suggesting hypovolemia. Reproductive: Marked enlargement of the uterus with multiple  calcifications consistent with large uterine fibroids. No adnexal masses are appreciated. Other: No free air or free fluid in the abdomen. Abdominal wall musculature appears intact. Musculoskeletal: Left hip hemiarthroplasty. No destructive bone lesions. IMPRESSION: 1. 4 mm stone in the distal common bile duct without proximal obstruction. 2. No evidence of bowel obstruction or inflammation. Prominent stool-filled colon and rectum. 3. Marked enlargement of the uterus with multiple calcifications consistent with large uterine fibroids. 4. Flattening of the inferior vena cava suggest hypovolemia. 5. Peribronchial thickening with peribronchial tree in bud infiltrates likely representing bronchopneumonia. 6. Aortic atherosclerosis. Electronically Signed  By: Lucienne Capers M.D.   On: 08/26/2017 22:51   Dg Abdomen Acute W/chest  Result Date: 08/28/2017 CLINICAL DATA:  Complaining of constipation and abdominal distension for 3 days. EXAM: DG ABDOMEN ACUTE W/ 1V CHEST COMPARISON:  CT scan 08/26/2017 FINDINGS: The upright chest x-ray does not demonstrate any acute pulmonary findings. No pleural effusion or pneumothorax. The heart is normal in size. Mild tortuosity of the thoracic aorta. There is a feeding gastrostomy tube in place. No complicating features are identified. There is a large amount of stool throughout the colon suggesting constipation. Markedly enlarged fibroid uterus with calcified fibroids. No free air. Left hip prosthesis noted. IMPRESSION: 1. No acute pulmonary findings. 2. Feeding gastrostomy tube appears to be well positioned in the left upper mid abdomen. 3. Large amount of stool throughout the colon suggesting constipation. 4. Markedly enlarged fibroid uterus. Electronically Signed   By: Marijo Sanes M.D.   On: 08/28/2017 17:06    Pending Labs Unresulted Labs (From admission, onward)   Start     Ordered   08/28/17 1806  Occult blood card to lab, stool Provider will collect  Once,   STAT     Question:  Specimen to be collected by?  Answer:  Provider will collect   08/28/17 1805      Vitals/Pain Today's Vitals   08/28/17 1439 08/28/17 1700 08/28/17 1730 08/28/17 1932  BP:  (!) 145/92 124/72 114/77  Pulse:  86 64 86  Resp:  18 14 17   Temp: 97.6 F (36.4 C)   97.8 F (36.6 C)  TempSrc: Axillary   Oral  SpO2:  100% 100% 97%  PainSc: 0-No pain 0-No pain  3     Isolation Precautions No active isolations  Medications Medications  0.9 %  sodium chloride infusion (has no administration in time range)  sodium chloride 0.9 % bolus 1,000 mL (0 mLs Intravenous Stopped 08/28/17 1933)  ondansetron (ZOFRAN) injection 4 mg (4 mg Intravenous Given 08/28/17 1718)    Mobility

## 2017-08-28 NOTE — ED Provider Notes (Signed)
[place] DEPT Provider Note   CSN: [id number] Arrival date & time: 08/28/17  1358     History   Chief Complaint Chief Complaint  Patient presents with  . Constipation  . Abdominal Distention    HPI Alyssa Savage is a 84 y.o. 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 Patient with aphasia at baseline secondary to stroke. She was seen here on 08/26/17 for emesis and was diagnosed with aspiration pneumonia. Sent here from Nursing home facility after patient with continued vomiting and abdominal distention.   EM LEVEL 5 CAVEAT DUE TO PATIENT'S BASELINE MENTAL STATUS   The history is provided by the patient.    Past Medical History:  Diagnosis Date  . Stroke [facility])     Patient Active Problem List   Diagnosis Date Noted  . AKI (acute kidney injury) ([place]) 08/28/2017    No past surgical history on file.   OB History   None      Home Medications    Prior to Admission medications   Medication Sig Start Date End Date Taking? Authorizing Provider  Amino Acids-Protein Hydrolys (FEEDING SUPPLEMENT, PRO-STAT SUGAR FREE 64,) LIQD Place 30 mLs into feeding tube 2 (two) times daily.    Yes [provider]  amLODipine (NORVASC) 5 MG tablet Place 5 mg into feeding tube daily.   Yes [provider]  atorvastatin (LIPITOR) 40 MG tablet Place 40 mg into feeding tube daily.  08/07/17  Yes [provider]  baclofen (LIORESAL) 20 MG tablet Place 20 mg into feeding tube 3 (three) times daily.   Yes [provider]  carvedilol (COREG) 6.25 MG tablet Place 6.25 mg into feeding tube 2 (two) times daily.  08/15/17  Yes [provider]  cetirizine (ZYRTEC) 10 MG tablet Place 10 mg into feeding tube daily.   Yes [provider]  cholestyramine (QUESTRAN) 4 g packet Place 4 g into feeding tube 2 (two)  times daily.  08/15/17  Yes [provider]  clopidogrel (PLAVIX) 75 MG tablet Place 75 mg into feeding tube daily.  08/15/17  Yes [provider]  ferrous sulfate 300 (60 Fe) MG/5ML syrup Place 325 mg into feeding tube daily.   Yes [provider]  levofloxacin (LEVAQUIN) 25 MG/ML solution Take 20 mLs (500 mg total) by mouth daily. Patient taking differently: Place 500 mg into feeding tube daily.  08/27/17  Yes Alyssa Ada K, PA-C  Nutritional Supplements (FEEDING SUPPLEMENT, OSMOLITE 1.5 CAL,) LIQD Place 237 mLs into feeding tube 4 (four) times daily. Provide 1420 KCL, 60 g Protein   Yes [provider]  PROTONIX 40 MG PACK Place 40 mg into feeding tube 2 (two) times daily.  08/12/17  Yes [provider]  ranitidine (ZANTAC) 150 MG/10ML syrup Place 150 mg into feeding tube daily.   Yes [provider]  saccharomyces boulardii (FLORASTOR) 250 MG capsule Place 250 mg into feeding tube daily.   Yes [provider]  traMADol (ULTRAM) 50 MG tablet Place 50 mg into feeding tube 3 (three) times daily. For pain 08/24/17  Yes [provider]  VOLTAREN 1 % GEL Apply 2 mg topically 4 (four) times daily. For pain 08/22/17  Yes [provider]  Water For Irrigation, Sterile (FREE WATER) SOLN Place 200 mLs into feeding tube every 6 (six) hours.   Yes [provider]  acetaminophen (TYLENOL) 160  MG/5ML liquid Place 650 mg into feeding tube every 6 (six) hours as needed for pain.     [provider]  bisacodyl (DULCOLAX) 10 MG suppository Place 10 mg rectally as needed for moderate constipation.    [provider]  Carboxymethylcellulose Sod PF (REFRESH PLUS) 0.5 % SOLN Place 1 drop into both eyes every 8 (eight) hours as needed (dry eyes).    [provider]  magnesium hydroxide (MILK OF MAGNESIA) 400 MG/5ML suspension Take 30 mLs by mouth daily as needed for mild constipation.    [provider]  nitroGLYCERIN (NITROSTAT) 0.4 MG SL tablet Place 0.4 mg under the tongue every 5 (five) minutes as needed for chest pain.    [provider]  Sodium Phosphates (RA SALINE ENEMA RE) Place 1 application rectally daily as needed (constipation).    [provider]    Family History No family history on file.  Social History Social History   Tobacco Use  . Smoking status: Unknown If Ever Smoked  Substance Use Topics  . Alcohol use: Not Currently  . Drug use: Not Currently     Allergies   Patient has no known allergies.   Review of Systems Review of Systems  Unable to perform ROS: Patient nonverbal     Physical Exam Updated Vital Signs BP 114/77   Pulse 86   Temp 97.8 F (36.6 C) (Oral)   Resp 17   SpO2 97%   Physical Exam  Constitutional: She appears cachectic.  Frail and elderly appearing  HENT:  Head: Normocephalic and atraumatic.  Mouth/Throat: Oropharynx is clear and moist and mucous membranes are normal.  Eyes: Pupils are equal, round, and reactive to light. Conjunctivae, EOM and lids are normal.  Neck: Full passive range of motion without pain.  Cardiovascular: Normal rate, regular rhythm and normal pulses. Exam reveals no gallop.  Pulmonary/Chest: Effort normal and breath sounds normal.  Abdominal: She exhibits distension. Bowel sounds are decreased. There is generalized tenderness. There is no rigidity and no guarding.  Musculoskeletal: Normal range of motion.  Neurological: She is alert. She displays atrophy.  Nonverbal   Skin: Skin is warm and dry. Capillary refill takes less than 2 seconds.  Psychiatric: She has a normal mood and affect. Her speech is normal.  Nursing note and vitals reviewed.    ED Treatments / Results  Labs (all labs ordered are listed, but only abnormal results are displayed) Labs Reviewed  COMPREHENSIVE METABOLIC PANEL - Abnormal; Notable for the following components:      Result Value   Sodium 148 (*)      Glucose, Bld 130 (*)    BUN 56 (*)    Creatinine, Ser 1.06 (*)    Total Protein 8.9 (*)    GFR calc non Af Amer 47 (*)    GFR calc Af Amer 54 (*)    All other components within normal limits  LIPASE, BLOOD - Abnormal; Notable for the following components:   Lipase 89 (*)    All other components within normal limits  CBC WITH DIFFERENTIAL/PLATELET - Abnormal; Notable for the following components:   RDW 16.5 (*)    Platelets 426 (*)    All other components within normal limits  URINALYSIS, ROUTINE W REFLEX MICROSCOPIC - Abnormal; Notable for the following components:   Protein, ur 30 (*)    All other components within normal limits  OCCULT BLOOD X 1 CARD TO LAB, STOOL  POC OCCULT BLOOD, ED  EKG None  Radiology Ct Abdomen Pelvis W Contrast  Result Date: 08/26/2017 CLINICAL DATA:  Vomiting and lethargy today. History of stroke and feeding tube. EXAM: CT ABDOMEN AND PELVIS WITH CONTRAST TECHNIQUE: Multidetector CT imaging of the abdomen and pelvis was performed using the standard protocol following bolus administration of intravenous contrast. CONTRAST:  118m ISOVUE-300 IOPAMIDOL (ISOVUE-300) INJECTION 61% COMPARISON:  None. FINDINGS: Lower chest: Lung bases demonstrate peribronchial thickening with peribronchial tree-in-bud infiltrates likely representing bronchopneumonia. Cardiac enlargement. Small esophageal hiatal hernia. Hepatobiliary: No focal liver lesions. Gallbladder is unremarkable. No bile duct dilatation, but there is a 4 mm stone in the distal common bile duct. Pancreas: Unremarkable. No pancreatic ductal dilatation or surrounding inflammatory changes. Spleen: Normal in size without focal abnormality. Adrenals/Urinary Tract: No adrenal gland nodules. Multiple cysts in the kidneys. Nephrograms are symmetrical. No hydronephrosis or hydroureter. Bladder appears intact but visualization is limited due to streak artifact from hip arthroplasty. Stomach/Bowel: Stomach, small bowel,  and colon are not abnormally distended. Gastrostomy tube along the greater curvature of the stomach. Diffusely stool-filled colon. Prominent stool in the rectum without rectal wall thickening. No definite bowel inflammatory changes. Appendix is not identified. Vascular/Lymphatic: Aortic atherosclerosis. No enlarged abdominal or pelvic lymph nodes. Inferior vena cava is somewhat decompressed suggesting hypovolemia. Reproductive: Marked enlargement of the uterus with multiple calcifications consistent with large uterine fibroids. No adnexal masses are appreciated. Other: No free air or free fluid in the abdomen. Abdominal wall musculature appears intact. Musculoskeletal: Left hip hemiarthroplasty. No destructive bone lesions. IMPRESSION: 1. 4 mm stone in the distal common bile duct without proximal obstruction. 2. No evidence of bowel obstruction or inflammation. Prominent stool-filled colon and rectum. 3. Marked enlargement of the uterus with multiple calcifications consistent with large uterine fibroids. 4. Flattening of the inferior vena cava suggest hypovolemia. 5. Peribronchial thickening with peribronchial tree in bud infiltrates likely representing bronchopneumonia. 6. Aortic atherosclerosis. Electronically Signed   By: WLucienne CapersM.D.   On: 08/26/2017 22:51   Dg Abdomen Acute W/chest  Result Date: 08/28/2017 CLINICAL DATA:  Complaining of constipation and abdominal distension for 3 days. EXAM: DG ABDOMEN ACUTE W/ 1V CHEST COMPARISON:  CT scan 08/26/2017 FINDINGS: The upright chest x-ray does not demonstrate any acute pulmonary findings. No pleural effusion or pneumothorax. The heart is normal in size. Mild tortuosity of the thoracic aorta. There is a feeding gastrostomy tube in place. No complicating features are identified. There is a large amount of stool throughout the colon suggesting constipation. Markedly enlarged fibroid uterus with calcified fibroids. No free air. Left hip prosthesis noted.  IMPRESSION: 1. No acute pulmonary findings. 2. Feeding gastrostomy tube appears to be well positioned in the left upper mid abdomen. 3. Large amount of stool throughout the colon suggesting constipation. 4. Markedly enlarged fibroid uterus. Electronically Signed   By: PMarijo SanesM.D.   On: 08/28/2017 17:06    Procedures Fecal disimpaction Date/Time: 08/28/2017 6:35 PM Performed by: LVolanda Napoleon PA-C Authorized by: LVolanda Napoleon PA-C  Consent: Verbal consent obtained. Consent given by: Family. Patient understanding: patient states understanding of the procedure being performed Patient consent: the patient's understanding of the procedure matches consent given Procedure consent: procedure consent matches procedure scheduled Relevant documents: relevant documents present and verified Test results: test results available and properly labeled Imaging studies: imaging studies available Patient identity confirmed: arm band Patient tolerance: Patient tolerated the procedure well with no immediate complications Comments: Fecal disimpaction with family members.  Discussed risk first benefits.  Family members agree to procedure.  Patient had removal of soft dark-colored stool.  She did have some bright red blood noted around the stool but there is evidence of internal hemorrhoids.    (including critical care time)  Medications Ordered in ED Medications  0.9 %  sodium chloride infusion (has no administration in time range)  sodium chloride 0.9 % bolus 1,000 mL (0 mLs Intravenous Stopped 08/28/17 1933)  ondansetron (ZOFRAN) injection 4 mg (4 mg Intravenous Given 08/28/17 1718)     Initial Impression / Assessment and Plan / ED Course  I have reviewed the triage vital signs and the nursing notes.  Pertinent labs & imaging results that were available during my care of the patient were reviewed by me and considered in my medical decision making (see chart for details).     84 y.o. F  with PMH/o CVA who presents for evaluation of emesis and abdominal distention. Patient brought into by EMS from nursing home for evaluation of emesis and abdominal distention.  Seen here in the ED on 08/26/17 for similar symptoms.  Labs were unremarkable.  CT did show a 4 mm gallstone but no evidence of obstruction.  Was discharged home with antibiotics for coverage of aspiration pneumonia.  Return from nursing home today for continued symptoms.  They note that the abdomen was distended.  Unclear when last bowel movement was.  On exam, patient does have a slightly distended abdomen but no rigidity.  Patient is aphasic and is nonverbal.  Plan to check basic labs, UA, chest x-ray, abdominal x-ray.  Family at bedside.  They are concerned about dehydration.  They state that patient gets PEG tube feedings and they have been giving him to her but she keeps vomiting.  Do not know if patient is on a stool softener.  CBC without any significant leukocytosis, anemia.  Lipase is elevated at 89.  2 days ago was 158.  CMP shows sodium of 148.  BUN is 56 and creatinine is 1.06.  This is an increase from 2 days ago.  AST, ALT, alk phos within normal limits.  Total bili within normal limits.  UA negative for any acute infectious etiology.  Fecal disimpaction performed as documented above.  Patient tolerated procedure well.  Fecal occult negative.  Given AKI, continued dehydration vomiting and continued constipation, patient would likely benefit from admission for IV fluids, enemas for correction of constipation.  Discussed patient with Dr. Karleen Hampshire (hospitalist).  Will admit patient.   Final Clinical Impressions(s) / ED Diagnoses   Final diagnoses:  Hypernatremia  Acute kidney injury ([place])  Constipation, unspecified constipation type    ED Discharge Orders    None       Desma Mcgregor 08/28/17 1940    Lajean Saver, MD 08/28/17 2202

## 2017-08-28 NOTE — ED Triage Notes (Signed)
Pt c/o constipation and abdominal distention. Last BM 3 days ago. Also vomiting yellow phelgm. Patient is normal to her baseline- non-verbal. Hx stroke, right side weak. Has a hard left lean. Pt was here 2 days ago.

## 2017-08-28 NOTE — H&P (Signed)
History and Physical    Alyssa Savage DDU:[id number] DOB: 07/28/1930 DOA: 08/28/2017  PCP: Patient, No Pcp Per  Patient coming from: [place]  I have personally briefly reviewed patient's old medical records in [place]  Chief Complaint: sent for the second time this week for vomiting.   HPI: Alyssa Savage is a 84 y.o. female with medical history significant of stroke in February this year, was brought in a second time this week for vomiting and abdominal distention. Pt is non verbal. Most of the history obtained is from the daughter at bedside and EDP.  As per the daughter, she was vomiting 2 days ago , was seen in ED and sent back to [facility] with antibiotics for bronchopneumonia.  She is back today with similar complaints and her abd x ray does not show any obstruction or ileus but large  stool throughout the colon.  As per the daughter, no fever or chills.   ED Course: lab work today shows elevated sodium of 148, creatinine of 1.06, lipase of 89 improved from 158. And platelets of 426. abd x ray shows large stool throughout the colon.  PA in ED, was able to take out impacted stool.    She was referred to medical service for observation for constipation and mild AKI.   Review of Systems: couldn't be obtained , pt non verbal.   Past Medical History:  Diagnosis Date  . Stroke [facility])     No past surgical history on file.   reports that she drank alcohol. She reports that she has current or past drug history. Her tobacco history is not on file.  No Known Allergies  No family history on file. Could n' t be obtained, pt is non verbal.   Prior to Admission medications   Medication Sig Start Date End Date Taking? Authorizing Provider  Amino Acids-Protein Hydrolys (FEEDING SUPPLEMENT, PRO-STAT SUGAR FREE 64,) LIQD Place 30 mLs into feeding tube 2 (two) times daily.    Yes [provider]  amLODipine (NORVASC) 5 MG tablet Place 5 mg into feeding tube daily.    Yes [provider]  atorvastatin (LIPITOR) 40 MG tablet Place 40 mg into feeding tube daily.  08/07/17  Yes [provider]  baclofen (LIORESAL) 20 MG tablet Place 20 mg into feeding tube 3 (three) times daily.   Yes [provider]  carvedilol (COREG) 6.25 MG tablet Place 6.25 mg into feeding tube 2 (two) times daily.  08/15/17  Yes [provider]  cetirizine (ZYRTEC) 10 MG tablet Place 10 mg into feeding tube daily.   Yes [provider]  cholestyramine (QUESTRAN) 4 g packet Place 4 g into feeding tube 2 (two) times daily.  08/15/17  Yes [provider]  clopidogrel (PLAVIX) 75 MG tablet Place 75 mg into feeding tube daily.  08/15/17  Yes [provider]  ferrous sulfate 300 (60 Fe) MG/5ML syrup Place 325 mg into feeding tube daily.   Yes [provider]  levofloxacin (LEVAQUIN) 25 MG/ML solution Take 20 mLs (500 mg total) by mouth daily. Patient taking differently: Place 500 mg into feeding tube daily.  08/27/17  Yes [name], PA-C  Nutritional Supplements (FEEDING SUPPLEMENT, OSMOLITE 1.5 CAL,) LIQD Place 237 mLs into feeding tube 4 (four) times daily. Provide 1420 KCL, 60 g Protein   Yes [provider]  PROTONIX 40 MG PACK Place 40 mg into feeding tube 2 (two) times daily.  08/12/17  Yes [provider]  ranitidine (ZANTAC) 150 MG/10ML syrup Place 150 mg into feeding tube daily.   Yes [provider]  saccharomyces boulardii (FLORASTOR) 250 MG capsule Place 250 mg into feeding tube daily.   Yes [provider]  traMADol (ULTRAM) 50 MG tablet Place 50 mg into feeding tube 3 (three) times daily. For pain 08/24/17  Yes [provider]  VOLTAREN 1 % GEL Apply 2 mg topically 4 (four) times daily. For pain 08/22/17  Yes [provider]  Water For Irrigation, Sterile (FREE WATER) SOLN Place 200 mLs into feeding tube every 6 (six) hours.   Yes [provider]    acetaminophen (TYLENOL) 160 MG/5ML liquid Place 650 mg into feeding tube every 6 (six) hours as needed for pain.     [provider]  bisacodyl (DULCOLAX) 10 MG suppository Place 10 mg rectally as needed for moderate constipation.    [provider]  Carboxymethylcellulose Sod PF (REFRESH PLUS) 0.5 % SOLN Place 1 drop into both eyes every 8 (eight) hours as needed (dry eyes).    [provider]  magnesium hydroxide (MILK OF MAGNESIA) 400 MG/5ML suspension Take 30 mLs by mouth daily as needed for mild constipation.    [provider]  nitroGLYCERIN (NITROSTAT) 0.4 MG SL tablet Place 0.4 mg under the tongue every 5 (five) minutes as needed for chest pain.    [provider]  Sodium Phosphates (RA SALINE ENEMA RE) Place 1 application rectally daily as needed (constipation).    [provider]    Physical Exam: Vitals:   08/28/17 1700 08/28/17 1730 08/28/17 1932 08/28/17 2036  BP: (!) 145/92 124/72 114/77 (!) 148/96  Pulse: 86 64 86 91  Resp: 18 14 17 16   Temp:   97.8 F (36.6 C) 98.1 F (36.7 C)  TempSrc:   Oral Oral  SpO2: 100% 100% 97% 90%    Constitutional: NAD, calm, comfortable Vitals:   08/28/17 1700 08/28/17 1730 08/28/17 1932 08/28/17 2036  BP: (!) 145/92 124/72 114/77 (!) 148/96  Pulse: 86 64 86 91  Resp: 18 14 17 16   Temp:   97.8 F (36.6 C) 98.1 F (36.7 C)  TempSrc:   Oral Oral  SpO2: 100% 100% 97% 90%   Eyes: PERRL, lids and conjunctivae normal ENMT: Mucous membranes are dry.  Neck: normal, supple, no masses, no thyromegaly Respiratory: coarse breath sounds, no wheezing ,  Diminished at bases.  Cardiovascular: Regular rate and rhythm, no murmurs, S1S2  Abdomen: mildly distended, PEG in place. Good bowel sounds.  Musculoskeletal: muscle wasting, poor tone. No edema.  Skin: pressure ulcer on the sacrum.  Neurologic:non verbal, s/p stroke , bed bound.  Psychiatric: non verbal.     Labs on Admission: I have  personally reviewed following labs and imaging studies  CBC: Recent Labs  Lab 08/26/17 1529 08/28/17 1528  WBC 10.9* 8.0  NEUTROABS  --  5.3  HGB 11.8* 13.6  HCT 37.9 42.8  MCV 87.1 85.9  PLT 451* 329*   Basic Metabolic Panel: Recent Labs  Lab 08/26/17 1529 08/28/17 1528  NA 140 148*  K 5.5* 4.1  CL 103 111  CO2 26 26  GLUCOSE 123* 130*  BUN 50* 56*  CREATININE 0.91 1.06*  CALCIUM 10.2 10.2   GFR: CrCl cannot be calculated (Unknown ideal weight.). Liver Function Tests: Recent Labs  Lab 08/26/17 1529 08/28/17 1528  AST 34 32  ALT 31 29  ALKPHOS 102 87  BILITOT 0.6 0.4  PROT 8.3* 8.9*  ALBUMIN 3.3* 3.5   Recent Labs  Lab 08/26/17 1529 08/28/17 1528  LIPASE 158* 89*   No results for input(s): AMMONIA in the last 168 hours. Coagulation Profile: No results for input(s): INR, PROTIME in the last 168 hours. Cardiac Enzymes: No results for input(s): CKTOTAL, CKMB, CKMBINDEX, TROPONINI in the last 168 hours. BNP (last 3 results) No results for input(s): PROBNP in the last 8760 hours. HbA1C: No results for input(s): HGBA1C in the last 72 hours. CBG: No results for input(s): GLUCAP in the last 168 hours. Lipid Profile: No results for input(s): CHOL, HDL, LDLCALC, TRIG, CHOLHDL, LDLDIRECT in the last 72 hours. Thyroid Function Tests: No results for input(s): TSH, T4TOTAL, FREET4, T3FREE, THYROIDAB in the last 72 hours. Anemia Panel: No results for input(s): VITAMINB12, FOLATE, FERRITIN, TIBC, IRON, RETICCTPCT in the last 72 hours. Urine analysis:    Component Value Date/Time   COLORURINE YELLOW 08/28/2017 1750   APPEARANCEUR CLEAR 08/28/2017 1750   LABSPEC 1.017 08/28/2017 1750   PHURINE 6.0 08/28/2017 1750   GLUCOSEU NEGATIVE 08/28/2017 1750   HGBUR NEGATIVE 08/28/2017 1750   BILIRUBINUR NEGATIVE 08/28/2017 1750   KETONESUR NEGATIVE 08/28/2017 1750   PROTEINUR 30 (A) 08/28/2017 1750   NITRITE NEGATIVE 08/28/2017 1750   LEUKOCYTESUR NEGATIVE  08/28/2017 1750    Radiological Exams on Admission: Ct Abdomen Pelvis W Contrast  Result Date: 08/26/2017 CLINICAL DATA:  Vomiting and lethargy today. History of stroke and feeding tube. EXAM: CT ABDOMEN AND PELVIS WITH CONTRAST TECHNIQUE: Multidetector CT imaging of the abdomen and pelvis was performed using the standard protocol following bolus administration of intravenous contrast. CONTRAST:  130mL ISOVUE-300 IOPAMIDOL (ISOVUE-300) INJECTION 61% COMPARISON:  None. FINDINGS: Lower chest: Lung bases demonstrate peribronchial thickening with peribronchial tree-in-bud infiltrates likely representing bronchopneumonia. Cardiac enlargement. Small esophageal hiatal hernia. Hepatobiliary: No focal liver lesions. Gallbladder is unremarkable. No bile duct dilatation, but there is a 4 mm stone in the distal common bile duct. Pancreas: Unremarkable. No pancreatic ductal dilatation or surrounding inflammatory changes. Spleen: Normal in size without focal abnormality. Adrenals/Urinary Tract: No adrenal gland nodules. Multiple cysts in the kidneys. Nephrograms are symmetrical. No hydronephrosis or hydroureter. Bladder appears intact but visualization is limited due to streak artifact from hip arthroplasty. Stomach/Bowel: Stomach, small bowel, and colon are not abnormally distended. Gastrostomy tube along the greater curvature of the stomach. Diffusely stool-filled colon. Prominent stool in the rectum without rectal wall thickening. No definite bowel inflammatory changes. Appendix is not identified. Vascular/Lymphatic: Aortic atherosclerosis. No enlarged abdominal or pelvic lymph nodes. Inferior vena cava is somewhat decompressed suggesting hypovolemia. Reproductive: Marked enlargement of the uterus with multiple calcifications consistent with large uterine fibroids. No adnexal masses are appreciated. Other: No free air or free fluid in the abdomen. Abdominal wall musculature appears intact. Musculoskeletal: Left hip  hemiarthroplasty. No destructive bone lesions. IMPRESSION: 1. 4 mm stone in the distal common bile duct without proximal obstruction. 2. No evidence of bowel obstruction or inflammation. Prominent stool-filled colon and rectum. 3. Marked enlargement of the uterus with multiple calcifications consistent with large uterine fibroids. 4. Flattening of the inferior vena cava suggest hypovolemia. 5. Peribronchial thickening with peribronchial tree in bud infiltrates likely representing bronchopneumonia. 6. Aortic atherosclerosis. Electronically Signed   By: Lucienne Capers M.D.   On: 08/26/2017 22:51   Dg Abdomen Acute W/chest  Result Date: 08/28/2017 CLINICAL DATA:  Complaining of constipation and abdominal distension for 3 days. EXAM: DG ABDOMEN ACUTE W/ 1V CHEST COMPARISON:  CT scan 08/26/2017 FINDINGS: The upright  chest x-ray does not demonstrate any acute pulmonary findings. No pleural effusion or pneumothorax. The heart is normal in size. Mild tortuosity of the thoracic aorta. There is a feeding gastrostomy tube in place. No complicating features are identified. There is a large amount of stool throughout the colon suggesting constipation. Markedly enlarged fibroid uterus with calcified fibroids. No free air. Left hip prosthesis noted. IMPRESSION: 1. No acute pulmonary findings. 2. Feeding gastrostomy tube appears to be well positioned in the left upper mid abdomen. 3. Large amount of stool throughout the colon suggesting constipation. 4. Markedly enlarged fibroid uterus. Electronically Signed   By: Marijo Sanes M.D.   On: 08/28/2017 17:06    EKG: not done.   Assessment/Plan Active Problems:   AKI (acute kidney injury) ([place])   Stroke (HCC)   Constipation   Essential hypertension   Bronchopneumonia  AKI:  - possibly pre renal azotemia.  - start her on free water boluses 200 ml every 6 hours.  - recheck renal parameters in am.    Leukocytosis from two days ago Resolved.    Hypernatremia:    Free water deficit.  Recommend free water boluses every 6 hours.  Recheck in am.    Bronchopneumonia:  - continue the levaquin to complete the course.    Constipation:  - manual removal of impacted stool attempted in ED.  - start her on senna, colace , miralax via PEG tube.    Stroke in feb 2019 : - since then pt is non verbal, bed bound.  - resume plavix.    Stage 1 sacral decubitus:  Wound care will be consulted in am.    Mild pancreatitis:  Improving lipase.  She does not wince on abdominal exam.  Monitor.  If lipase is worsening, we might have to hold the tube feeds.     Hypertension;  Well controlled.      DVT prophylaxis: LOVENOX.  Code Status:  Full code.  Family Communication: daughter at bedside.  Disposition Plan: pending clinical improvement.  Consults called: wound care.  Admission status: obs/medsurg.    Hosie Poisson MD [facility] Pager [phone]   If 7PM-7AM, please contact night-coverage [email or web address] Password [facility]  08/28/2017, 9:32 PM

## 2017-08-28 NOTE — ED Notes (Signed)
Bed: WA03 Expected date:  Expected time:  Means of arrival:  Comments: 84 yo HTN, Constipation

## 2017-08-28 NOTE — ED Notes (Signed)
Has a feeding tube.

## 2017-08-29 ENCOUNTER — Inpatient Hospital Stay (HOSPITAL_COMMUNITY): Payer: Medicare Other

## 2017-08-29 DIAGNOSIS — Z96642 Presence of left artificial hip joint: Secondary | ICD-10-CM | POA: Diagnosis present

## 2017-08-29 DIAGNOSIS — L89151 Pressure ulcer of sacral region, stage 1: Secondary | ICD-10-CM | POA: Diagnosis present

## 2017-08-29 DIAGNOSIS — E785 Hyperlipidemia, unspecified: Secondary | ICD-10-CM | POA: Diagnosis present

## 2017-08-29 DIAGNOSIS — Z6820 Body mass index (BMI) 20.0-20.9, adult: Secondary | ICD-10-CM | POA: Diagnosis not present

## 2017-08-29 DIAGNOSIS — E861 Hypovolemia: Secondary | ICD-10-CM | POA: Diagnosis present

## 2017-08-29 DIAGNOSIS — E440 Moderate protein-calorie malnutrition: Secondary | ICD-10-CM

## 2017-08-29 DIAGNOSIS — Z7902 Long term (current) use of antithrombotics/antiplatelets: Secondary | ICD-10-CM | POA: Diagnosis not present

## 2017-08-29 DIAGNOSIS — N179 Acute kidney failure, unspecified: Secondary | ICD-10-CM | POA: Diagnosis present

## 2017-08-29 DIAGNOSIS — J690 Pneumonitis due to inhalation of food and vomit: Secondary | ICD-10-CM | POA: Diagnosis present

## 2017-08-29 DIAGNOSIS — R109 Unspecified abdominal pain: Secondary | ICD-10-CM | POA: Diagnosis not present

## 2017-08-29 DIAGNOSIS — K5900 Constipation, unspecified: Secondary | ICD-10-CM | POA: Diagnosis not present

## 2017-08-29 DIAGNOSIS — E860 Dehydration: Secondary | ICD-10-CM | POA: Diagnosis present

## 2017-08-29 DIAGNOSIS — Z515 Encounter for palliative care: Secondary | ICD-10-CM | POA: Diagnosis not present

## 2017-08-29 DIAGNOSIS — K219 Gastro-esophageal reflux disease without esophagitis: Secondary | ICD-10-CM | POA: Diagnosis present

## 2017-08-29 DIAGNOSIS — Z7189 Other specified counseling: Secondary | ICD-10-CM | POA: Diagnosis not present

## 2017-08-29 DIAGNOSIS — K5641 Fecal impaction: Secondary | ICD-10-CM | POA: Diagnosis present

## 2017-08-29 DIAGNOSIS — J180 Bronchopneumonia, unspecified organism: Secondary | ICD-10-CM | POA: Diagnosis not present

## 2017-08-29 DIAGNOSIS — I69351 Hemiplegia and hemiparesis following cerebral infarction affecting right dominant side: Secondary | ICD-10-CM | POA: Diagnosis not present

## 2017-08-29 DIAGNOSIS — I69320 Aphasia following cerebral infarction: Secondary | ICD-10-CM | POA: Diagnosis not present

## 2017-08-29 DIAGNOSIS — I10 Essential (primary) hypertension: Secondary | ICD-10-CM | POA: Diagnosis present

## 2017-08-29 DIAGNOSIS — E870 Hyperosmolality and hypernatremia: Secondary | ICD-10-CM | POA: Diagnosis present

## 2017-08-29 DIAGNOSIS — Z931 Gastrostomy status: Secondary | ICD-10-CM | POA: Diagnosis not present

## 2017-08-29 DIAGNOSIS — I639 Cerebral infarction, unspecified: Secondary | ICD-10-CM | POA: Diagnosis not present

## 2017-08-29 DIAGNOSIS — K648 Other hemorrhoids: Secondary | ICD-10-CM | POA: Diagnosis present

## 2017-08-29 DIAGNOSIS — K859 Acute pancreatitis without necrosis or infection, unspecified: Secondary | ICD-10-CM | POA: Diagnosis present

## 2017-08-29 DIAGNOSIS — Z7401 Bed confinement status: Secondary | ICD-10-CM | POA: Diagnosis not present

## 2017-08-29 LAB — BASIC METABOLIC PANEL
ANION GAP: 7 (ref 5–15)
BUN: 50 mg/dL — ABNORMAL HIGH (ref 8–23)
CO2: 25 mmol/L (ref 22–32)
Calcium: 9.1 mg/dL (ref 8.9–10.3)
Chloride: 112 mmol/L — ABNORMAL HIGH (ref 98–111)
Creatinine, Ser: 0.97 mg/dL (ref 0.44–1.00)
GFR calc Af Amer: >60 mL/min (ref >60)
GFR, EST NON AFRICAN AMERICAN: 52 mL/min — AB (ref >60)
Glucose, Bld: 125 mg/dL — ABNORMAL HIGH (ref 70–99)
POTASSIUM: 3.6 mmol/L (ref 3.5–5.1)
SODIUM: 144 mmol/L (ref 135–145)

## 2017-08-29 LAB — LIPASE, BLOOD: LIPASE: 66 U/L — AB (ref 11–51)

## 2017-08-29 MED ORDER — AMOXICILLIN-POT CLAVULANATE 400-57 MG/5ML PO SUSR
875.0000 mg | Freq: Two times a day (BID) | ORAL | Status: DC
  Administered 2017-08-29 – 2017-09-01 (×7): 875 mg
  Filled 2017-08-29 (×8): qty 10.9

## 2017-08-29 MED ORDER — LEVOFLOXACIN 750 MG PO TABS: 750.0000 mg | ORAL_TABLET | ORAL | Status: DC

## 2017-08-29 NOTE — Progress Notes (Signed)
Initial Nutrition Assessment  DOCUMENTATION CODES:   Non-severe (moderate) malnutrition in context of chronic illness  INTERVENTION:   Continue Osmolite 1.5, 237 ml QID w/ 30 ml Prostat BID. Continue free water flushes of 200 ml every 6 hours (800 ml) This provides 1620 kcal, 89g protein and 1524 ml H2O.  NUTRITION DIAGNOSIS:   Moderate Malnutrition related to chronic illness(aphasia from previous CVA) as evidenced by moderate fat depletion, moderate muscle depletion.  GOAL:   Patient will meet greater than or equal to 90% of their needs  MONITOR:   Labs, Weight trends, TF tolerance, I & O's  REASON FOR ASSESSMENT:   Consult Assessment of nutrition requirement/status  ASSESSMENT:   84 y.o. year old female with medical history significant for CVA with baseline aphasia and right-sided deficits who presented from [place] rehab facility on 08/28/2017 with reports of emesis and abdominal swelling.   Patient in room with no family present. Pt nonverbal and barely nods her head yes or no to questions. Per RN, pt has been tolerating bolus feeds of Osmolite 1.5 and Prostat with no issues. Pt was having vomiting PTA. Pt was constipated and had disimpaction in ED resulting in a large amount of stool removed per chart review. Will continue bolus feeds as ordered.  No weight history in chart.  Medications: Florastor capsule daily Labs reviewed.  NUTRITION - FOCUSED PHYSICAL EXAM:    Most Recent Value  Orbital Region  Mild depletion  Upper Arm Region  Moderate depletion  Thoracic and Lumbar Region  Unable to assess  Buccal Region  Severe depletion  Temple Region  Moderate depletion  Clavicle Bone Region  Moderate depletion  Clavicle and Acromion Bone Region  Mild depletion  Scapular Bone Region  Unable to assess  Dorsal Hand  Moderate depletion  Patellar Region  Unable to assess  Anterior Thigh Region  Unable to assess  Posterior Calf Region  Unable to assess  Edema (RD  Assessment)  None       Diet Order:   Diet Order           Diet NPO time specified  Diet effective now          EDUCATION NEEDS:   Not appropriate for education at this time  Skin:  Skin Assessment: Reviewed RN Assessment  Last BM:  Disimpaction in ED on 7/28  Height:   Ht Readings from Last 1 Encounters:  08/29/17 5\' 2"  (1.575 m)    Weight:   Wt Readings from Last 1 Encounters:  08/29/17 110 lb 10.7 oz (50.2 kg)    Ideal Body Weight:  50 kg  BMI:  Body mass index is 20.24 kg/m.  Estimated Nutritional Needs:   Kcal:  1400-1600  Protein:  65-75g  Fluid:  1.8L/day   Clayton Bibles, MS, RD, LDN [place] [facility] Pager: [phone] After Hours Pager: [phone]

## 2017-08-29 NOTE — Care Management Obs Status (Addendum)
MEDICARE OBSERVATION STATUS NOTIFICATION   Patient Details  Name: Alyssa Savage MRN: [id number] Date of Birth: 05-05-1930   Medicare Observation Status Notification Given:  Other (see comment) Unable to sign spoke with dtg on the phone.   Leeroy Cha, RN 08/29/2017, 10:56 AM

## 2017-08-29 NOTE — Progress Notes (Signed)
Chaplain following due to spiritual care consult / family request.  No family present on rounding today.   Consulted with RN who reports family left around 10a this morning and is planning on returning to hospital.  Will continue to attempt contact with family to assess support needs.    [facility] / [facility] Chaplain Jerene Pitch, MDiv, [facility]

## 2017-08-29 NOTE — Consult Note (Signed)
[facility] Nurse wound consult note Reason for Consult: sacrum Healed area over the sacrum, re-epithelized skin Wound type: none Pressure Injury POA: NA Wound bed: none Drainage (amount, consistency, odor) none Periwound: intact  Dressing procedure/placement/frequency: Silicone foam to protect the vunerable skin. Turn and reposition every 2 hours Manage incontinence with external female urine containment device Added low air loss mattress for pressure redistribution for high risk patient with low Braden   Discussed POC with bedside nurse.  Re consult if needed, will not follow at this time. Thanks  Shakhia Gramajo [employer], RN,CWOCN, CNS, [place] [phone])

## 2017-08-29 NOTE — Progress Notes (Addendum)
PROGRESS NOTE  Alyssa Savage MWU:[id number] DOB: 1930/12/04 DOA: 08/28/2017 PCP: Patient, No Pcp Per  HPI  Alyssa Savage is a 84 y.o. 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  She was previously seen in the ED on 7/26 for similar symptoms.  Labs at that time were unremarkable aside from a lipase of 158.  CT scan at that time showed 4 mm gallstone with no evidence of obstruction prominent stool-filled colon and rectum.  Chest x-ray was full.  Patient was discharged on Levaquin for presumed aspiration pneumonia.  Per the family the facility reported patient had 2 more episodes of nonbilious emesis prior to this admission with abdominal swelling.    At baseline patient is nonverbal.  Family states approximately 2 weeks ago patient was able to communicate by pointing to words/letters but has been more lethargic recently. Brief narrative Abdominal x-ray in ED showed G-tube in appropriate place with large amount of stool throughout the colon suggesting constipation and a large fibroid uterus.  Patient underwent disimpaction in ED with large and removal of stool per reports.  She was admitted for observation for dehydration and constipation   Subjective Has not had any vomiting overnight  Assessment/Plan: Active Problems:   AKI (acute kidney injury) ([place])   Stroke (HCC)   Constipation   Essential hypertension   Bronchopneumonia   Hypernatremia  Altered mental status - Per family baseline mental status limited communication -Currently very lethargic, not interactive -Suspect related to severe constipation and dehydration -if no improvement with supportive care will consider CT scan of head  Hyponatremia, hypovolemic, resolved - Improved with IV fluids and free water boluses (uses PEG tube) - Likely related to  dehydration  Severe constipation, improving - Large amount of stool on abdominal x-ray -Status post disimpaction ED - No obstruction on imaging - Vomiting has resolved - Optimize bowel regimen, continue free water flushes to prevent dehydration which likely worsens constipation -Nutrition consulted  Bronchopneumonia, stable - Aspiration pneumonia diagnosed on last ED visit on 7/26 -Was discharged on Levaquin -Given elderly will avoid fluoroquinolones -Transition to Augmentin (patient) to give via PEG tube  History of CVA with right-sided residual deficits -Continue baclofen 20 mg 3 times daily -Home Plavix  Hyperlipidemia, stable -Continue Lipitor GERD, stable -Continue Zantac  Hypertension, at goal - Home amlodipine  Code Status: Full code  Family Communication: Daughter and cousin updated at bedside  Disposition Plan: Continue to optimize bowel regimen, improvement in mental status   Consultants:  None     Procedures:  None  Antimicrobials: Anti-infectives (From admission, onward)   Start     Dose/Rate Route Frequency Ordered Stop   08/29/17 2200  levofloxacin (LEVAQUIN) tablet 750 mg  Status:  Discontinued     750 mg Per Tube Every 48 hours 08/29/17 0631 08/29/17 0953   08/29/17 1200  amoxicillin-clavulanate (AUGMENTIN) 400-57 MG/5ML suspension 875 mg     875 mg Per Tube Every 12 hours 08/29/17 0953 09/03/17 0959   08/29/17 1000  levofloxacin (LEVAQUIN) tablet 500 mg  Status:  Discontinued     500 mg Per Tube Daily 08/28/17 2117 08/29/17 0631         Cultures:  None  Telemetry: No  DVT prophylaxis: Lovenox   Objective: Vitals:   08/28/17 1730 08/28/17 1932 08/28/17 2036 08/29/17 0420  BP: 124/72 114/77 (!) 148/96 139/75  Pulse:  64 86 91 89  Resp: 14 17 16 15   Temp:  97.8 F (36.6 C) 98.1 F (36.7 C) 98.1 F (36.7 C)  TempSrc:  Oral Oral Oral  SpO2: 100% 97% 90% 100%  Weight:    50.2 kg (110 lb 10.7 oz)  Height:    5\' 2"  (1.575 m)     Intake/Output Summary (Last 24 hours) at 08/29/2017 1025 Last data filed at 08/29/2017 0600 Gross per 24 hour  Intake 1273.25 ml  Output -  Net 1273.25 ml   Filed Weights   08/29/17 0420  Weight: 50.2 kg (110 lb 10.7 oz)    Exam:  Constitutional: Elderly female, lying in bed, no distress Eyes:anicteric, normal conjunctivae ENMT: Oropharynx with dry mucous membranes Cardiovascular: RRR no MRGs, with no peripheral edema Respiratory: Normal respiratory effort, clear breath sounds  Abdomen: Soft, mildly distended, no appreciable tenderness, PEG tube in place with no surrounding redness or drainage non-tender, Skin: No rash ulcers, or lesions. Without skin tenting  Neurologic: Right-sided deficit, opens eyes to voice, slouched in bed   Data Reviewed: CBC: Recent Labs  Lab 08/26/17 1529 08/28/17 1528  WBC 10.9* 8.0  NEUTROABS  --  5.3  HGB 11.8* 13.6  HCT 37.9 42.8  MCV 87.1 85.9  PLT 451* 938*   Basic Metabolic Panel: Recent Labs  Lab 08/26/17 1529 08/28/17 1528 08/29/17 0451  NA 140 148* 144  K 5.5* 4.1 3.6  CL 103 111 112*  CO2 26 26 25   GLUCOSE 123* 130* 125*  BUN 50* 56* 50*  CREATININE 0.91 1.06* 0.97  CALCIUM 10.2 10.2 9.1   GFR: Estimated Creatinine Clearance: 34.1 mL/min (by C-G formula based on SCr of 0.97 mg/dL). Liver Function Tests: Recent Labs  Lab 08/26/17 1529 08/28/17 1528  AST 34 32  ALT 31 29  ALKPHOS 102 87  BILITOT 0.6 0.4  PROT 8.3* 8.9*  ALBUMIN 3.3* 3.5   Recent Labs  Lab 08/26/17 1529 08/28/17 1528 08/29/17 0451  LIPASE 158* 89* 66*   No results for input(s): AMMONIA in the last 168 hours. Coagulation Profile: No results for input(s): INR, PROTIME in the last 168 hours. Cardiac Enzymes: No results for input(s): CKTOTAL, CKMB, CKMBINDEX, TROPONINI in the last 168 hours. BNP (last 3 results) No results for input(s): PROBNP in the last 8760 hours. HbA1C: No results for input(s): HGBA1C in the last 72 hours. CBG: No  results for input(s): GLUCAP in the last 168 hours. Lipid Profile: No results for input(s): CHOL, HDL, LDLCALC, TRIG, CHOLHDL, LDLDIRECT in the last 72 hours. Thyroid Function Tests: No results for input(s): TSH, T4TOTAL, FREET4, T3FREE, THYROIDAB in the last 72 hours. Anemia Panel: No results for input(s): VITAMINB12, FOLATE, FERRITIN, TIBC, IRON, RETICCTPCT in the last 72 hours. Urine analysis:    Component Value Date/Time   COLORURINE YELLOW 08/28/2017 1750   APPEARANCEUR CLEAR 08/28/2017 1750   LABSPEC 1.017 08/28/2017 1750   PHURINE 6.0 08/28/2017 1750   GLUCOSEU NEGATIVE 08/28/2017 1750   HGBUR NEGATIVE 08/28/2017 1750   BILIRUBINUR NEGATIVE 08/28/2017 1750   KETONESUR NEGATIVE 08/28/2017 1750   PROTEINUR 30 (A) 08/28/2017 1750   NITRITE NEGATIVE 08/28/2017 1750   LEUKOCYTESUR NEGATIVE 08/28/2017 1750   Sepsis Labs: @LABRCNTIP (procalcitonin:4,lacticidven:4)  )No results found for this or any previous visit (from the past 240 hour(s)).    Studies: Dg Abdomen Acute W/chest  Result Date: 08/28/2017 CLINICAL DATA:  Complaining of constipation and abdominal distension for 3 days. EXAM: DG ABDOMEN ACUTE W/ 1V CHEST COMPARISON:  CT scan 08/26/2017 FINDINGS: The upright chest x-ray does not demonstrate any acute pulmonary findings. No pleural effusion or pneumothorax. The heart is normal in size. Mild tortuosity of the thoracic aorta. There is a feeding gastrostomy tube in place. No complicating features are identified. There is a large amount of stool throughout the colon suggesting constipation. Markedly enlarged fibroid uterus with calcified fibroids. No free air. Left hip prosthesis noted. IMPRESSION: 1. No acute pulmonary findings. 2. Feeding gastrostomy tube appears to be well positioned in the left upper mid abdomen. 3. Large amount of stool throughout the colon suggesting constipation. 4. Markedly enlarged fibroid uterus. Electronically Signed   By: Marijo Sanes M.D.   On:  08/28/2017 17:06    Scheduled Meds: . amLODipine  5 mg Per Tube Daily  . amoxicillin-clavulanate  875 mg Per Tube Q12H  . atorvastatin  40 mg Per Tube q1800  . baclofen  20 mg Per Tube TID  . carvedilol  6.25 mg Per Tube BID  . clopidogrel  75 mg Per Tube Daily  . enoxaparin (LOVENOX) injection  40 mg Subcutaneous Q24H  . feeding supplement (OSMOLITE 1.5 CAL)  237 mL Per Tube QID  . feeding supplement (PRO-STAT SUGAR FREE 64)  30 mL Per Tube BID  . free water  200 mL Per Tube Q6H  . loratadine  10 mg Per Tube Daily  . pantoprazole sodium  40 mg Per Tube BID  . ranitidine  150 mg Per Tube Daily  . saccharomyces boulardii  250 mg Per Tube Daily    Continuous Infusions: . sodium chloride 75 mL/hr at 08/29/17 0930     LOS: 0 days     Desiree Hane, MD [facility] Pager [phone]  If 7PM-7AM, please contact night-coverage [email or web address] Password [facility] 08/29/2017, 10:25 AM

## 2017-08-29 NOTE — Progress Notes (Signed)
PHARMACY NOTE:  ANTIMICROBIAL RENAL DOSAGE ADJUSTMENT  Current antimicrobial regimen includes a mismatch between antimicrobial dosage and estimated renal function.  As per policy approved by the Pharmacy & Therapeutics and Medical Executive Committees, the antimicrobial dosage will be adjusted accordingly.  Current antimicrobial dosage: Levofloxacin 500mg  per tube daily  Indication: bronchopneumonia  Renal Function:  Estimated Creatinine Clearance: 34.1 mL/min (by C-G formula based on SCr of 0.97 mg/dL). []      On intermittent HD, scheduled: []      On CRRT    Antimicrobial dosage has been changed to:  Levofloxacin 750mg  per tube q48h through 09/02/17 to complete course  Thank you for allowing pharmacy to be a part of this patient's care.  Luiz Ochoa, [facility] 08/29/2017 6:28 AM

## 2017-08-30 ENCOUNTER — Inpatient Hospital Stay (HOSPITAL_COMMUNITY): Payer: Medicare Other

## 2017-08-30 DIAGNOSIS — R109 Unspecified abdominal pain: Secondary | ICD-10-CM

## 2017-08-30 DIAGNOSIS — E440 Moderate protein-calorie malnutrition: Secondary | ICD-10-CM

## 2017-08-30 LAB — BASIC METABOLIC PANEL
Anion gap: 7 (ref 5–15)
BUN: 36 mg/dL — ABNORMAL HIGH (ref 8–23)
CHLORIDE: 112 mmol/L — AB (ref 98–111)
CO2: 25 mmol/L (ref 22–32)
Calcium: 8.9 mg/dL (ref 8.9–10.3)
Creatinine, Ser: 0.75 mg/dL (ref 0.44–1.00)
GFR calc Af Amer: >60 mL/min (ref >60)
GLUCOSE: 111 mg/dL — AB (ref 70–99)
POTASSIUM: 3.5 mmol/L (ref 3.5–5.1)
SODIUM: 144 mmol/L (ref 135–145)

## 2017-08-30 MED ORDER — SENNOSIDES-DOCUSATE SODIUM 8.6-50 MG PO TABS: 2.0000 | ORAL_TABLET | Freq: Two times a day (BID) | ORAL | Status: DC

## 2017-08-30 MED ORDER — SENNOSIDES-DOCUSATE SODIUM 8.6-50 MG PO TABS
2.0000 | ORAL_TABLET | Freq: Two times a day (BID) | ORAL | Status: DC
Start: 2017-08-30 — End: 2017-09-01
  Administered 2017-08-30 – 2017-09-01 (×5): 2
  Filled 2017-08-30 (×5): qty 2

## 2017-08-30 MED ORDER — BISACODYL 10 MG RE SUPP
10.0000 mg | Freq: Once | RECTAL | Status: AC
  Administered 2017-08-30: 10 mg via RECTAL
  Filled 2017-08-30: qty 1

## 2017-08-30 MED ORDER — POLYETHYLENE GLYCOL 3350 17 G PO PACK
17.0000 g | PACK | Freq: Every day | ORAL | Status: DC
  Administered 2017-08-30 – 2017-09-01 (×3): 17 g
  Filled 2017-08-30 (×3): qty 1

## 2017-08-30 NOTE — Clinical Social Work Note (Signed)
Clinical Social Work Assessment  Patient Details  Name: Alyssa Savage MRN: [id number] Date of Birth: [birth year]  Date of referral:  08/30/17               Reason for consult:  Discharge Planning                Permission sought to share information with:  Case Manager, Facility Sport and exercise psychologist, Family Supports Permission granted to share information::  No(Patient non-verbal)  Name::     Designer, jewellery::  [place]  Relationship::  Daughter  Contact Information:     Housing/Transportation Living arrangements for the past 2 months:  [place] of Information:  Patient Patient Interpreter Needed:  None(Patient non-verbal) Criminal Activity/Legal Involvement Pertinent to Current Situation/Hospitalization:  No - Comment as needed Significant Relationships:  Adult Children, Warehouse manager Lives with:  Facility Resident Do you feel safe going back to the place where you live?  Yes Need for family participation in patient care:  Yes (Comment)  Care giving concerns:  No care giving concerns at the time of assessment.    Social Worker assessment / plan:  LCSW consulted for facility placement.   Patient admitted for Acute Kidney Injury.   LCSW met at bedside with patient. Patient is non verbal. Patient's daughter, Alyssa Savage is present.   Patient is from [place] and is long term care. Patient is total care at facility due to stroke. According to daughter patient has been in facility for a few months.   PLAN: Patient will return to SNF at dc.       Employment status:  Retired Forensic scientist:  Medicare PT Recommendations:  Not assessed at this time Information / Referral to community resources:     Patient/Family's Response to care: Daughter is thankful for LCSW visit and dc coordination.   Patient/Family's Understanding of and Emotional Response to Diagnosis, Current Treatment, and Prognosis:  Daughter is knowledgeable of patients current  condition and is agreeable to return to SNF once patient is medically stable.   Emotional Assessment Appearance:  Appears stated age Attitude/Demeanor/Rapport:  Unable to Assess Affect (typically observed):  Unable to Assess Orientation:  Oriented to Self Alcohol / Substance use:  Not Applicable Psych involvement (Current and /or in the community):  No (Comment)  Discharge Needs  Concerns to be addressed:  No discharge needs identified Readmission within the last 30 days:  No Current discharge risk:  None Barriers to Discharge:  Continued Medical Work up   [employer], LCSW 08/30/2017, 11:14 AM

## 2017-08-30 NOTE — Progress Notes (Signed)
PROGRESS NOTE  Alyssa Savage KDT:[id number] DOB: 1930/05/10 DOA: 08/28/2017 PCP: Alyssa Savage, No Pcp Per  HPI  Alyssa Savage is a 84 y.o. 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  She was previously seen in the ED on 7/26 for similar symptoms.  Labs at that time were unremarkable aside from a lipase of 158.  CT scan at that time showed 4 mm gallstone with no evidence of obstruction prominent stool-filled colon and rectum.  Chest x-ray was full.  Alyssa Savage was discharged on Levaquin for presumed aspiration pneumonia.  Per the family the facility reported Alyssa Savage had 2 more episodes of nonbilious emesis prior to this admission with abdominal swelling.    At baseline Alyssa Savage is nonverbal.  Family states approximately 2 weeks ago Alyssa Savage was able to communicate by pointing to words/letters but has been more lethargic recently.  Brief narrative Abdominal x-ray in ED showed G-tube in appropriate place with large amount of stool throughout the colon suggesting constipation and a large fibroid uterus.  Alyssa Savage underwent disimpaction in ED with large and removal of stool per reports.  She was admitted for observation for dehydration and constipation.   Subjective No acute events overnight  Assessment/Plan: Active Problems:   AKI (acute kidney injury) ([place])   Stroke (HCC)   Constipation   Essential hypertension   Bronchopneumonia   Hypernatremia   Malnutrition of moderate degree  Altered mental status, improving - Per family baseline mental status limited communication (able to point to words/letters to communicate with the board) -Much improved, now able to follow with her eyes during conversation, and nod yes and no appropriately to questions.  Still nonverbal (consistent with baseline)  -Initially on admission was very lethargic, not  interactive -Suspect related to severe constipation and dehydration -CT head shows old basal ganglia lacunar infarct with no acute abnormalities -Consulted palliative care (to encourage goals of care discussion, symptom management), daughter aware  Hyponatremia, hypovolemic, resolved - Improved with IV fluids and free water boluses (uses PEG tube) - Likely related to dehydration -Will need to continue free water regimen (200 cc every 6H) on discharge back to SNF  Severe constipation, improving - Large amount of stool on abdominal x-ray on admission, status post disimpaction ED - Repeat abdominal x-ray on 7/30 shows persistent large amount of stool, status post disimpaction by nursing on 7/30 -Start bowel regimen (stool report is very soft and liquidy): Scheduled MiraLAX, senna docusate, suppository - No obstruction on imaging - Vomiting has resolved - Continue free water flushes to prevent dehydration which likely worsens constipation -Nutrition consulted  Bronchopneumonia, stable - Aspiration pneumonia diagnosed on last ED visit on 7/26 -Was discharged on Levaquin -Given elderly will avoid fluoroquinolones -Transitioned to Augmentin (Alyssa Savage) to give via PEG tube on 7/29  History of CVA with right-sided residual deficits -Continue baclofen 20 mg 3 times daily -Home Plavix -No acute changes on CT head  Hyperlipidemia, stable -Continue Lipitor GERD, stable -Continue Zantac  Hypertension, at goal - Home amlodipine  Code Status: Full code  Family Communication: Daughter updated at bedside  Disposition Plan: Continue to optimize bowel regimen, improvement in mental status, palliative care consulted   Consultants:  Palliative care    Procedures:  None  Antimicrobials: Anti-infectives (From admission, onward)   Start     Dose/Rate Route Frequency Ordered Stop   08/29/17 2200  levofloxacin (LEVAQUIN) tablet  750 mg  Status:  Discontinued     750 mg Per Tube Every 48  hours 08/29/17 0631 08/29/17 0953   08/29/17 1200  amoxicillin-clavulanate (AUGMENTIN) 400-57 MG/5ML suspension 875 mg     875 mg Per Tube Every 12 hours 08/29/17 0953 09/03/17 0959   08/29/17 1000  levofloxacin (LEVAQUIN) tablet 500 mg  Status:  Discontinued     500 mg Per Tube Daily 08/28/17 2117 08/29/17 0631        Cultures:  None  Telemetry: No  DVT prophylaxis: Lovenox   Objective: Vitals:   08/29/17 1355 08/29/17 2015 08/30/17 0517 08/30/17 1359  BP: 122/70 130/74 125/71 (!) 143/88  Pulse: 94 66 73 81  Resp:  20 18 16   Temp:  98.6 F (37 C) 99 F (37.2 C) 97.9 F (36.6 C)  TempSrc:  Axillary Axillary   SpO2: 97% 98% 98% 100%  Weight:      Height:        Intake/Output Summary (Last 24 hours) at 08/30/2017 1558 Last data filed at 08/30/2017 1610 Gross per 24 hour  Intake 1760.75 ml  Output 600 ml  Net 1160.75 ml   Filed Weights   08/29/17 0420  Weight: 50.2 kg (110 lb 10.7 oz)    Exam:  Constitutional: Elderly female, lying in bed, no distress Eyes:anicteric, normal conjunctivae, EOMI ENMT: Oropharynx with dry mucous membranes Cardiovascular: RRR no MRGs, with no peripheral edema Respiratory: Normal respiratory effort, clear breath sounds  Abdomen: Soft, mildly distended, no appreciable tenderness, PEG tube in place with no surrounding redness or drainage non-tender, Skin: No rash ulcers, or lesions. Without skin tenting  Neurologic: Right-sided deficit, opens eyes to voice, tracks eyes while speaking, not had a probably yes and no to simple questions, slouched in bed   Data Reviewed: CBC: Recent Labs  Lab 08/26/17 1529 08/28/17 1528  WBC 10.9* 8.0  NEUTROABS  --  5.3  HGB 11.8* 13.6  HCT 37.9 42.8  MCV 87.1 85.9  PLT 451* 960*   Basic Metabolic Panel: Recent Labs  Lab 08/26/17 1529 08/28/17 1528 08/29/17 0451 08/30/17 1316  NA 140 148* 144 144  K 5.5* 4.1 3.6 3.5  CL 103 111 112* 112*  CO2 26 26 25 25   GLUCOSE 123* 130* 125* 111*   BUN 50* 56* 50* 36*  CREATININE 0.91 1.06* 0.97 0.75  CALCIUM 10.2 10.2 9.1 8.9   GFR: Estimated Creatinine Clearance: 41.4 mL/min (by C-G formula based on SCr of 0.75 mg/dL). Liver Function Tests: Recent Labs  Lab 08/26/17 1529 08/28/17 1528  AST 34 32  ALT 31 29  ALKPHOS 102 87  BILITOT 0.6 0.4  PROT 8.3* 8.9*  ALBUMIN 3.3* 3.5   Recent Labs  Lab 08/26/17 1529 08/28/17 1528 08/29/17 0451  LIPASE 158* 89* 66*   No results for input(s): AMMONIA in the last 168 hours. Coagulation Profile: No results for input(s): INR, PROTIME in the last 168 hours. Cardiac Enzymes: No results for input(s): CKTOTAL, CKMB, CKMBINDEX, TROPONINI in the last 168 hours. BNP (last 3 results) No results for input(s): PROBNP in the last 8760 hours. HbA1C: No results for input(s): HGBA1C in the last 72 hours. CBG: No results for input(s): GLUCAP in the last 168 hours. Lipid Profile: No results for input(s): CHOL, HDL, LDLCALC, TRIG, CHOLHDL, LDLDIRECT in the last 72 hours. Thyroid Function Tests: No results for input(s): TSH, T4TOTAL, FREET4, T3FREE, THYROIDAB in the last 72 hours. Anemia Panel: No results for input(s): VITAMINB12, FOLATE, FERRITIN, TIBC, IRON,  RETICCTPCT in the last 72 hours. Urine analysis:    Component Value Date/Time   COLORURINE YELLOW 08/28/2017 1750   APPEARANCEUR CLEAR 08/28/2017 1750   LABSPEC 1.017 08/28/2017 1750   PHURINE 6.0 08/28/2017 1750   GLUCOSEU NEGATIVE 08/28/2017 1750   HGBUR NEGATIVE 08/28/2017 1750   BILIRUBINUR NEGATIVE 08/28/2017 1750   KETONESUR NEGATIVE 08/28/2017 1750   PROTEINUR 30 (A) 08/28/2017 1750   NITRITE NEGATIVE 08/28/2017 1750   LEUKOCYTESUR NEGATIVE 08/28/2017 1750   Sepsis Labs: @LABRCNTIP (procalcitonin:4,lacticidven:4)  )No results found for this or any previous visit (from the past 240 hour(s)).    Studies: Ct Head Wo Contrast  Result Date: 08/29/2017 CLINICAL DATA:  Nausea and vomiting.  History of CVA with aphasia.  EXAM: CT HEAD WITHOUT CONTRAST TECHNIQUE: Contiguous axial images were obtained from the base of the skull through the vertex without intravenous contrast. COMPARISON:  None. FINDINGS: Brain: There is no mass, hemorrhage or extra-axial collection. The size and configuration of the ventricles and extra-axial CSF spaces are normal. There are old lacunar infarcts of the left basal ganglia. There is hypoattenuation of the periventricular white matter, most commonly indicating chronic ischemic microangiopathy. Vascular: No abnormal hyperdensity of the major intracranial arteries or dural venous sinuses. No intracranial atherosclerosis. Skull: The visualized skull base, calvarium and extracranial soft tissues are normal. Sinuses/Orbits: No fluid levels or advanced mucosal thickening of the visualized paranasal sinuses. No mastoid or middle ear effusion. The orbits are normal. IMPRESSION: Old basal ganglia lacunar infarcts and chronic small vessel ischemia without acute intracranial abnormality. Electronically Signed   By: Ulyses Jarred M.D.   On: 08/29/2017 22:07   Dg Abd 2 Views  Result Date: 08/30/2017 CLINICAL DATA:  Persistent abdominal distension, possible ileus. Assess stool burden. EXAM: ABDOMEN - 2 VIEW COMPARISON:  Abdominal radiograph of August 28, 2017 FINDINGS: The colonic stool burden is markedly increased similar to that seen previously. There is a moderate amount of stool in the rectum. Multiple calcified uterine fibroids are present with skin within the known enlarged uterus. IMPRESSION: Persistent increased colonic stool burden. Somewhat increased rectal stool burden which may reflect a fecal impaction. Electronically Signed   By: David  Martinique M.D.   On: 08/30/2017 12:49    Scheduled Meds: . amLODipine  5 mg Per Tube Daily  . amoxicillin-clavulanate  875 mg Per Tube Q12H  . atorvastatin  40 mg Per Tube q1800  . baclofen  20 mg Per Tube TID  . bisacodyl  10 mg Rectal Once  . carvedilol  6.25 mg  Per Tube BID  . clopidogrel  75 mg Per Tube Daily  . enoxaparin (LOVENOX) injection  40 mg Subcutaneous Q24H  . feeding supplement (OSMOLITE 1.5 CAL)  237 mL Per Tube QID  . feeding supplement (PRO-STAT SUGAR FREE 64)  30 mL Per Tube BID  . free water  200 mL Per Tube Q6H  . loratadine  10 mg Per Tube Daily  . pantoprazole sodium  40 mg Per Tube BID  . polyethylene glycol  17 g Per Tube Daily  . ranitidine  150 mg Per Tube Daily  . saccharomyces boulardii  250 mg Per Tube Daily  . senna-docusate  2 tablet Per Tube BID    Continuous Infusions:    LOS: 1 day     Desiree Hane, MD [facility] Pager [phone]  If 7PM-7AM, please contact night-coverage [email or web address] Password [facility] 08/30/2017, 3:58 PM

## 2017-08-30 NOTE — [place] FL2 (Signed)
[place] MEDICAID FL2 LEVEL OF CARE SCREENING TOOL     IDENTIFICATION  Patient Name: Alyssa Savage Birthdate: February 11, 1930 Sex: female Admission Date (Current Location): 08/28/2017  [facility] and [place] Number:  Herbalist and Address:  [facility],  [place] [address], [place]      Provider Number: [id number]  Attending Physician Name and Address:  Desiree Hane, MD  Relative Name and Phone Number:       Current Level of Care: Hospital Recommended Level of Care: [place] Prior Approval Number:    Date Approved/Denied:   PASRR Number:    Discharge Plan: SNF    Current Diagnoses: Patient Active Problem List   Diagnosis Date Noted  . Malnutrition of moderate degree 08/29/2017  . AKI (acute kidney injury) ([place]) 08/28/2017  . Stroke ([place]) 08/28/2017  . Constipation 08/28/2017  . Essential hypertension 08/28/2017  . Bronchopneumonia 08/28/2017  . Hypernatremia 08/28/2017    Orientation RESPIRATION BLADDER Height & Weight     Self  Normal Continent, External catheter Weight: 110 lb 10.7 oz (50.2 kg) Height:  5\' 2"  (157.5 cm)  BEHAVIORAL SYMPTOMS/MOOD NEUROLOGICAL BOWEL NUTRITION STATUS      Continent Diet(See dc summary)  AMBULATORY STATUS COMMUNICATION OF NEEDS Skin   Total Care Non-Verbally Normal                       Personal Care Assistance Level of Assistance  Total care       Total Care Assistance: Maximum assistance   Functional Limitations Info  Sight, Hearing, Speech Sight Info: Adequate Hearing Info: Adequate Speech Info: Impaired(non-verbal)    SPECIAL CARE FACTORS FREQUENCY                       Contractures Contractures Info: Not present    Additional Factors Info  Code Status, Allergies Code Status Info: Full Allergies Info: NKA           Current Medications (08/30/2017):  This is the current hospital active medication list Current Facility-Administered Medications   Medication Dose Route Frequency Provider Last Rate Last Dose  . amLODipine (NORVASC) tablet 5 mg  5 mg Per Tube Daily Hosie Poisson, MD   5 mg at 08/30/17 1045  . amoxicillin-clavulanate (AUGMENTIN) 400-57 MG/5ML suspension 875 mg  875 mg Per Tube Q12H Oretha Milch D, MD   875 mg at 08/30/17 1045  . atorvastatin (LIPITOR) tablet 40 mg  40 mg Per Tube [id number] Hosie Poisson, MD   40 mg at 08/29/17 1808  . baclofen (LIORESAL) tablet 20 mg  20 mg Per Tube TID Hosie Poisson, MD   20 mg at 08/30/17 1045  . bisacodyl (DULCOLAX) suppository 10 mg  10 mg Rectal PRN Hosie Poisson, MD      . carvedilol (COREG) tablet 6.25 mg  6.25 mg Per Tube BID Hosie Poisson, MD   6.25 mg at 08/30/17 1045  . clopidogrel (PLAVIX) tablet 75 mg  75 mg Per Tube Daily Hosie Poisson, MD   75 mg at 08/30/17 0834  . enoxaparin (LOVENOX) injection 40 mg  40 mg Subcutaneous Q24H Hosie Poisson, MD   40 mg at 08/30/17 1045  . feeding supplement (OSMOLITE 1.5 CAL) liquid 237 mL  237 mL Per Tube QID Hosie Poisson, MD   237 mL at 08/30/17 0614  . feeding supplement (PRO-STAT SUGAR FREE 64) liquid 30 mL  30 mL Per Tube BID Hosie Poisson, MD  30 mL at 08/30/17 1045  . free water 200 mL  200 mL Per Tube Q6H Hosie Poisson, MD   200 mL at 08/30/17 1050  . loratadine (CLARITIN) tablet 10 mg  10 mg Per Tube Daily Hosie Poisson, MD   10 mg at 08/30/17 1045  . magnesium hydroxide (MILK OF MAGNESIA) suspension 30 mL  30 mL Oral Daily PRN Hosie Poisson, MD      . nitroGLYCERIN (NITROSTAT) SL tablet 0.4 mg  0.4 mg Sublingual Q5 min PRN Hosie Poisson, MD      . pantoprazole sodium (PROTONIX) 40 mg/20 mL oral suspension 40 mg  40 mg Per Tube BID Hosie Poisson, MD   40 mg at 08/30/17 1045  . polyvinyl alcohol (LIQUIFILM TEARS) 1.4 % ophthalmic solution 1 drop  1 drop Both Eyes Q8H PRN Hosie Poisson, MD      . ranitidine (ZANTAC) 150 MG/10ML syrup 150 mg  150 mg Per Tube Daily Hosie Poisson, MD   150 mg at 08/30/17 1050  . saccharomyces boulardii  (FLORASTOR) capsule 250 mg  250 mg Per Tube Daily Hosie Poisson, MD   250 mg at 08/30/17 1045     Discharge Medications: Please see discharge summary for a list of discharge medications.  Relevant Imaging Results:  Relevant Lab Results:   Additional Information SSN: [id number]  Servando Snare, LCSW

## 2017-08-31 DIAGNOSIS — N179 Acute kidney failure, unspecified: Principal | ICD-10-CM

## 2017-08-31 DIAGNOSIS — J180 Bronchopneumonia, unspecified organism: Secondary | ICD-10-CM

## 2017-08-31 DIAGNOSIS — K5900 Constipation, unspecified: Secondary | ICD-10-CM

## 2017-08-31 LAB — BASIC METABOLIC PANEL
ANION GAP: 8 (ref 5–15)
BUN: 28 mg/dL — AB (ref 8–23)
CALCIUM: 9 mg/dL (ref 8.9–10.3)
CO2: 26 mmol/L (ref 22–32)
Chloride: 107 mmol/L (ref 98–111)
Creatinine, Ser: 0.6 mg/dL (ref 0.44–1.00)
GFR calc Af Amer: >60 mL/min (ref >60)
GLUCOSE: 154 mg/dL — AB (ref 70–99)
Potassium: 3.3 mmol/L — ABNORMAL LOW (ref 3.5–5.1)
Sodium: 141 mmol/L (ref 135–145)

## 2017-08-31 MED ORDER — BISACODYL 10 MG RE SUPP
10.0000 mg | Freq: Once | RECTAL | Status: AC
Start: 2017-08-31 — End: 2017-08-31
  Administered 2017-08-31: 10 mg via RECTAL
  Filled 2017-08-31: qty 1

## 2017-08-31 MED ORDER — POTASSIUM CHLORIDE 20 MEQ PO PACK
40.0000 meq | PACK | Freq: Once | ORAL | Status: AC
  Administered 2017-08-31: 40 meq
  Filled 2017-08-31: qty 2

## 2017-08-31 NOTE — Progress Notes (Signed)
Palliative:  I have contacted Ms. Biondolillo's daughter, Erline Levine, whom I will meet tomorrow 8/1 1000 am for [place] discussion. Thank you for this consult.   No charge  Vinie Sill, NP Palliative Medicine Team Pager # [phone] (M-F 8a-5p) Team Phone # [phone] (Nights/Weekends)

## 2017-08-31 NOTE — Progress Notes (Signed)
PROGRESS NOTE  Alyssa Savage IRJ:[id number] DOB: March 17, 1930 DOA: 08/28/2017 PCP: Patient, No Pcp Per  HPI  Alyssa Savage is a 84 y.o. 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  She was previously seen in the ED on 7/26 for similar symptoms.  Labs at that time were unremarkable aside from a lipase of 158.  CT scan at that time showed 4 mm gallstone with no evidence of obstruction prominent stool-filled colon and rectum.  Chest x-ray was full.  Patient was discharged on Levaquin for presumed aspiration pneumonia.  Per the family the facility reported patient had 2 more episodes of nonbilious emesis prior to this admission with abdominal swelling.    At baseline patient is nonverbal.  Family states approximately 2 weeks ago patient was able to communicate by pointing to words/letters but has been more lethargic recently.  Brief narrative Abdominal x-ray in ED showed G-tube in appropriate place with large amount of stool throughout the colon suggesting constipation and a large fibroid uterus.  Patient underwent disimpaction in ED with large and removal of stool per reports.  She was admitted for observation for dehydration and constipation.  With laxatives and suppositories patient has had multiple soft stools   Subjective Continues to have soft stools, no new concerns tolerating PEG tube bolus feeding well  Assessment/Plan: Active Problems:   AKI (acute kidney injury) ([place])   Stroke (HCC)   Constipation   Essential hypertension   Bronchopneumonia   Hypernatremia   Malnutrition of moderate degree  Altered mental status, improving - Per family baseline mental status limited communication (able to point to words/letters to communicate with the board) -Much improved, now able to follow with her eyes during conversation, and nod  yes and no appropriately to questions.  Still nonverbal (consistent with baseline)  -Initially on admission was very lethargic, not interactive -Suspect related to severe constipation and dehydration -CT head shows old basal ganglia lacunar infarct with no acute abnormalities -Official palliative care consult pending 09/01/2017  Hyponatremia, hypovolemic, resolved - Improved with IV fluids and free water boluses (uses PEG tube) - Likely related to dehydration  continue free water regimen (200 cc every 6H) on discharge back to SNF  Severe constipation, improving - Large amount of stool on abdominal x-ray on admission, status post disimpaction ED - Repeat abdominal x-ray on 7/30 shows persistent large amount of stool, status post disimpaction by nursing on 08/30/17 -c/n MiraLAX, senna docusate, suppository - No obstruction on imaging - Vomiting has resolved - Continue free water flushes to prevent dehydration which likely worsens constipation -Nutrition consulted, With laxatives and suppositories patient has had multiple soft stools  Bronchopneumonia, stable - Aspiration pneumonia diagnosed on last ED visit on 7/26 -Was discharged on Levaquin -Given elderly will avoid fluoroquinolones -c/n  Augmentin via PEG tube , started on 7/29  History of CVA with right-sided residual deficits -Continue baclofen 20 mg 3 times daily -Home Plavix -No acute changes on CT head  Hyperlipidemia, stable -Continue Lipitor GERD, stable -Continue Zantac  Hypertension, at goal - Home amlodipine  Code Status: Full code  Family Communication: Family at bedside Disposition Plan: Continue to optimize bowel regimen, improvement in mental status, palliative care consulted, possible discharge on 09/01/2017 after palliative care consult   Consultants:  Palliative care    Procedures:  None  Antimicrobials: Anti-infectives (From admission, onward)   Start  Dose/Rate Route Frequency Ordered Stop    08/29/17 2200  levofloxacin (LEVAQUIN) tablet 750 mg  Status:  Discontinued     750 mg Per Tube Every 48 hours 08/29/17 0631 08/29/17 0953   08/29/17 1200  amoxicillin-clavulanate (AUGMENTIN) 400-57 MG/5ML suspension 875 mg     875 mg Per Tube Every 12 hours 08/29/17 0953 09/03/17 0959   08/29/17 1000  levofloxacin (LEVAQUIN) tablet 500 mg  Status:  Discontinued     500 mg Per Tube Daily 08/28/17 2117 08/29/17 0631        Cultures:  None  Telemetry: No  DVT prophylaxis: Lovenox   Objective: Vitals:   08/30/17 1359 08/30/17 2049 08/31/17 0606 08/31/17 1406  BP: (!) 143/88 (!) 157/93 (!) 157/96 (!) 145/93  Pulse: 81 82 66 75  Resp: 16 (!) 22 19   Temp: 97.9 F (36.6 C) 98.2 F (36.8 C) 97.6 F (36.4 C) 97.8 F (36.6 C)  TempSrc:    Oral  SpO2: 100% 99% 100% 100%  Weight:      Height:        Intake/Output Summary (Last 24 hours) at 08/31/2017 1720 Last data filed at 08/31/2017 7124 Gross per 24 hour  Intake 994 ml  Output 1200 ml  Net -206 ml   Filed Weights   08/29/17 0420  Weight: 50.2 kg (110 lb 10.7 oz)    Exam:  Constitutional: Elderly female, lying in bed, no distress, not really verbal Eyes:anicteric, normal conjunctivae, EOMI Cardiovascular: RRR no MRGs, with no peripheral edema Respiratory: Normal respiratory effort, clear breath sounds  Abdomen: Soft, mildly distended, no appreciable tenderness, PEG tube in place with no surrounding redness or drainage non-tender, Skin: No rash ulcers, or lesions. Without skin tenting  Neurologic: Right-sided deficit, opens eyes to voice, tracks eyes while speaking,    Data Reviewed: CBC: Recent Labs  Lab 08/26/17 1529 08/28/17 1528  WBC 10.9* 8.0  NEUTROABS  --  5.3  HGB 11.8* 13.6  HCT 37.9 42.8  MCV 87.1 85.9  PLT 451* 580*   Basic Metabolic Panel: Recent Labs  Lab 08/26/17 1529 08/28/17 1528 08/29/17 0451 08/30/17 1316 08/31/17 0916  NA 140 148* 144 144 141  K 5.5* 4.1 3.6 3.5 3.3*  CL 103  111 112* 112* 107  CO2 26 26 25 25 26   GLUCOSE 123* 130* 125* 111* 154*  BUN 50* 56* 50* 36* 28*  CREATININE 0.91 1.06* 0.97 0.75 0.60  CALCIUM 10.2 10.2 9.1 8.9 9.0   GFR: Estimated Creatinine Clearance: 41.4 mL/min (by C-G formula based on SCr of 0.6 mg/dL). Liver Function Tests: Recent Labs  Lab 08/26/17 1529 08/28/17 1528  AST 34 32  ALT 31 29  ALKPHOS 102 87  BILITOT 0.6 0.4  PROT 8.3* 8.9*  ALBUMIN 3.3* 3.5   Recent Labs  Lab 08/26/17 1529 08/28/17 1528 08/29/17 0451  LIPASE 158* 89* 66*   No results for input(s): AMMONIA in the last 168 hours. Coagulation Profile: No results for input(s): INR, PROTIME in the last 168 hours. Cardiac Enzymes: No results for input(s): CKTOTAL, CKMB, CKMBINDEX, TROPONINI in the last 168 hours. BNP (last 3 results) No results for input(s): PROBNP in the last 8760 hours. HbA1C: No results for input(s): HGBA1C in the last 72 hours. CBG: No results for input(s): GLUCAP in the last 168 hours. Lipid Profile: No results for input(s): CHOL, HDL, LDLCALC, TRIG, CHOLHDL, LDLDIRECT in the last 72 hours. Thyroid Function Tests: No results for input(s): TSH, T4TOTAL, FREET4, T3FREE, THYROIDAB in  the last 72 hours. Anemia Panel: No results for input(s): VITAMINB12, FOLATE, FERRITIN, TIBC, IRON, RETICCTPCT in the last 72 hours. Urine analysis:    Component Value Date/Time   COLORURINE YELLOW 08/28/2017 1750   APPEARANCEUR CLEAR 08/28/2017 1750   LABSPEC 1.017 08/28/2017 1750   PHURINE 6.0 08/28/2017 1750   GLUCOSEU NEGATIVE 08/28/2017 1750   HGBUR NEGATIVE 08/28/2017 1750   BILIRUBINUR NEGATIVE 08/28/2017 1750   KETONESUR NEGATIVE 08/28/2017 1750   PROTEINUR 30 (A) 08/28/2017 1750   NITRITE NEGATIVE 08/28/2017 1750   LEUKOCYTESUR NEGATIVE 08/28/2017 1750   Sepsis Labs: @LABRCNTIP (procalcitonin:4,lacticidven:4)  )No results found for this or any previous visit (from the past 240 hour(s)).    Studies: No results  found.  Scheduled Meds: . amLODipine  5 mg Per Tube Daily  . amoxicillin-clavulanate  875 mg Per Tube Q12H  . atorvastatin  40 mg Per Tube q1800  . baclofen  20 mg Per Tube TID  . carvedilol  6.25 mg Per Tube BID  . clopidogrel  75 mg Per Tube Daily  . enoxaparin (LOVENOX) injection  40 mg Subcutaneous Q24H  . feeding supplement (OSMOLITE 1.5 CAL)  237 mL Per Tube QID  . feeding supplement (PRO-STAT SUGAR FREE 64)  30 mL Per Tube BID  . free water  200 mL Per Tube Q6H  . loratadine  10 mg Per Tube Daily  . pantoprazole sodium  40 mg Per Tube BID  . polyethylene glycol  17 g Per Tube Daily  . potassium chloride  40 mEq Per Tube Once  . ranitidine  150 mg Per Tube Daily  . saccharomyces boulardii  250 mg Per Tube Daily  . senna-docusate  2 tablet Per Tube BID    Continuous Infusions:    LOS: 2 days     Roxan Hockey, MD [facility]  If 7PM-7AM, please contact night-coverage [email or web address] Password [email or web address] 08/31/2017, 5:20 PM

## 2017-09-01 DIAGNOSIS — Z7189 Other specified counseling: Secondary | ICD-10-CM

## 2017-09-01 DIAGNOSIS — Z515 Encounter for palliative care: Secondary | ICD-10-CM

## 2017-09-01 MED ORDER — TRAMADOL HCL 50 MG PO TABS: 50.0000 mg | ORAL_TABLET | Freq: Two times a day (BID) | ORAL | 0 refills | Status: AC

## 2017-09-01 MED ORDER — AMOXICILLIN-POT CLAVULANATE 400-57 MG/5ML PO SUSR: 875.0000 mg | Freq: Two times a day (BID) | ORAL | 0 refills | Status: AC

## 2017-09-01 MED ORDER — BISACODYL 10 MG RE SUPP: 10.0000 mg | RECTAL | 2 refills | Status: AC

## 2017-09-01 MED ORDER — SENNOSIDES-DOCUSATE SODIUM 8.6-50 MG PO TABS: 2.0000 | ORAL_TABLET | Freq: Every day | ORAL | 4 refills | Status: AC

## 2017-09-01 MED ORDER — POLYETHYLENE GLYCOL 3350 17 G PO PACK: 17.0000 g | PACK | Freq: Every day | ORAL | 3 refills | Status: AC

## 2017-09-01 MED ORDER — GUAIFENESIN 100 MG/5ML PO SOLN: 10.0000 mL | Freq: Four times a day (QID) | ORAL | 0 refills | Status: AC | PRN

## 2017-09-01 MED ORDER — GUAIFENESIN 100 MG/5ML PO SOLN: 10.0000 mL | Freq: Four times a day (QID) | ORAL | Status: DC | PRN

## 2017-09-01 NOTE — Progress Notes (Signed)
Family member declined to have patient turned at midnight,stated she was sleeping

## 2017-09-01 NOTE — Discharge Instructions (Signed)
1)Palliative care consult regarding CODE STATUS and Goals of care at skilled nursing facility 2)Ongoing Palliative care conversations about MOST form delineating goals of care at SNF 3)H20 water flushes 200 mL every 6 hours through PEG tube 4)Please keep Head of bed at 45 degree angle while giving tube feeding and at least  for 2 hours post tube feeding 5)Give Osmolite- OSMOLITE 1.5 CAL,) LIQuiD- Place 237 mLs into feeding tube 4 (four) times daily.- Please keep Head of bed at 45 degree angle while giving tube feeding and at least  for 2 hours post tube feeding 6)Further Blood work frequency and timing to be determined after further conversations with palliative care team regarding goals of care

## 2017-09-01 NOTE — Discharge Summary (Signed)
Alyssa Savage, is a 84 y.o. female  DOB Oct 06, 1930  MRN [id number].  Admission date:  08/28/2017  Admitting Physician  Hosie Poisson, MD  Discharge Date:  09/01/2017   Primary MD  Patient, No Pcp Per  Recommendations for primary care physician for things to follow:   1)Palliative care consult regarding CODE STATUS and Goals of care at skilled nursing facility 2)Ongoing Palliative care conversations about MOST form delineating goals of care at SNF 3)H20 water flushes 200 mL every 6 hours through PEG tube 4)Please keep Head of bed at 45 degree angle while giving tube feeding and at least  for 2 hours post tube feeding 5)Give Osmolite- OSMOLITE 1.5 CAL,) LIQuiD- Place 237 mLs into feeding tube 4 (four) times daily.- Please keep Head of bed at 45 degree angle while giving tube feeding and at least  for 2 hours post tube feeding 6)Further Blood work frequency and timing to be determined after further conversations with palliative care team regarding goals of care   Admission Diagnosis  Hypernatremia [E87.0] Acute kidney injury ([place]) [N17.9] Constipation, unspecified constipation type [K59.00]   Discharge Diagnosis  Hypernatremia [E87.0] Acute kidney injury ([place]) [N17.9] Constipation, unspecified constipation type [K59.00]    Principal Problem:   AKI (acute kidney injury) ([place]) Active Problems:   Stroke ([place])   Constipation   Essential hypertension   Bronchopneumonia   Hypernatremia   Malnutrition of moderate degree      Past Medical History:  Diagnosis Date  . Stroke [facility])     Past Surgical History:  Procedure Laterality Date  . HIP SURGERY  03/2017     HPI  from the history and physical done on the day of admission:    HPI  Alyssa Savage is a 84 y.o. 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  She was previously seen in the ED on 7/26 for similar symptoms.  Labs at that time were unremarkable aside from a lipase of 158.  CT scan at that time showed 4 mm gallstone with no evidence of obstruction prominent stool-filled colon and rectum.  Chest x-ray was full.  Patient was discharged on Levaquin for presumed aspiration pneumonia.  Per the family the facility reported patient had 2 more episodes of nonbilious emesis prior to this admission with abdominal swelling.    At baseline patient is nonverbal.  Family states approximately 2 weeks ago patient was able to communicate by pointing to words/letters but has been more lethargic recently.    Hospital Course:     Brief Narrative Abdominal x-ray in ED showed G-tube in appropriate place with large amount of stool throughout the colon suggesting constipation and a large fibroid uterus.  Patient underwent disimpaction in ED with large and removal of stool per reports.  She was admitted for observation for dehydration and constipation.  With laxatives and suppositories patient has had multiple soft stools  Plan:-   Altered mental status-secondary to toxic metabolic encephalopathy- overall improved with treatment of electrolyte abnormalities as well as treatment of infection - Per family baseline mental status limited communication (able to point to words/letters to communicate with the board)- -Much improved, now able to follow with her eyes during conversation, and nod yes and no appropriately to questions.  Still nonverbal (consistent with baseline), -Initially on admission was very lethargic, not interactive -Suspect related to severe constipation and dehydration and Pneumonia -CT head shows old basal ganglia lacunar infarct with no acute abnormalities -Official palliative care consult from 09/01/2017-appreciated  FEN/dysphagia - give H20 water flushes 200 mL every 6  hours through PEG tube Please keep Head of bed at 45 degree angle while giving tube feeding and at least  for 2 hours post tube feeding, ive Osmolite- OSMOLITE 1.5 CAL,) LIQuiD- Place 237 mLs into feeding tube 4 (four) times daily.- Please keep Head of bed at 45 degree angle while giving tube feeding and at least  for 2 hours post tube feeding  FEN-dehydration and electrolyte concerns-resolved, - Improved with IV fluids and free water boluses (uses PEG tube) - Likely related to dehydration  continue free water regimen (200 cc every 6H) via peg on discharge back to SNF  Social/Ethics- Palliative care consult regarding CODE STATUS and Goals of care at skilled nursing facility, Ongoing Palliative care conversations about MOST form delineating goals of care at SNF, Further Blood work frequency and timing to be determined after further conversations with palliative care team regarding goals of care  Severe constipation, improving- - Large amount of stool on abdominal x-ray on admission, status post disimpaction ED - Repeat abdominal x-ray on 08/30/17 shows persistent large amount of stool, status post disimpaction by nursing on 08/30/17 -c/n MiraLAX daily, senna docusate nightly, Dulcolax suppository every Monday Wednesdays Fridays and Saturdays per rectum - No obstruction on imaging - Vomiting has resolved - Continue free water flushes to prevent dehydration which likely worsens constipation -Nutrition consulted, With laxatives and suppositories patient has had multiple soft stools  Bronchopneumonia, stable- - Aspiration pneumonia diagnosed on last ED visit on 7/26 c/n  Augmentin via PEG tube , started on 08/29/17  History of CVA with right-sided residual deficits -Continue baclofen 20 mg 3 times daily -c/n Plavix, -No acute changes on CT head  Hyperlipidemia, stable -Continue Lipitor GERD, stable -Continue Zantac  Hypertension, at goal- stable, c/n amlodipine  Code Status: Full code  (follow discussions per palliative care team)  Family Communication: Family at bedside Disposition Plan: Continue to optimize bowel regimen, improvement in mental status, palliative care consulted, discharge on 09/01/2017   Consultants:  Palliative care  Procedures:  None  Discharge Condition: stable,.  Chronically ill and debilitated  Follow UP  Contact information for after-discharge care    Destination    [facility] Preferred SNF .   Service:  Skilled Nursing Contact information: [address] [place] [phone]              Diet and Activity recommendation:  As advised  Discharge Instructions     Discharge Instructions    Amb Referral to Palliative Care   Complete by:  As directed    1)Palliative care consult regarding CODE STATUS and Goals of care at skilled nursing facility 2)Ongoing Palliative care conversations about MOST form delineating goals of care at SNF   Bed rest   Complete by:  As directed    1)Frequent turns to prevent  decubitus ulcers 2)Please keep Head of bed at 45 degree angle while giving tube feeding and at least  for 2 hours post tube feeding Give Osmolite- OSMOLITE 1.5 CAL,) LIQuiD- Place 237 mLs into feeding tube 4 (four) times daily.- Please keep Head of bed at 45 degree angle while giving tube feeding and at least  for 2 hours post tube feeding   Call MD for:  difficulty breathing, headache or visual disturbances   Complete by:  As directed    Call MD for:  persistant nausea and vomiting   Complete by:  As directed    Call MD for:  severe uncontrolled pain   Complete by:  As directed    Call MD for:  temperature >100.4   Complete by:  As directed    Diet general   Complete by:  As directed    H20 water flushes 200 mL every 6 hours through PEG tube Please keep Head of bed at 45 degree angle while giving tube feeding and at least  for 2 hours post tube feeding Give Osmolite- OSMOLITE  1.5 CAL,) LIQuiD- Place 237 mLs into feeding tube 4 (four) times daily.- Please keep Head of bed at 45 degree angle while giving tube feeding and at least  for 2 hours post tube feeding   Discharge instructions   Complete by:  As directed    1)Palliative care consult regarding CODE STATUS and Goals of care at skilled nursing facility 2)Ongoing Palliative care conversations about MOST form delineating goals of care at SNF 3)H20 water flushes 200 mL every 6 hours through PEG tube 4)Please keep Head of bed at 45 degree angle while giving tube feeding and at least  for 2 hours post tube feeding 5)Give Osmolite- OSMOLITE 1.5 CAL,) LIQuiD- Place 237 mLs into feeding tube 4 (four) times daily.- Please keep Head of bed at 45 degree angle while giving tube feeding and at least  for 2 hours post tube feeding 6)Further Blood work frequency and timing to be determined after further conversations with palliative care team regarding goals of care       Discharge Medications     Allergies as of 09/01/2017   No Known Allergies     Medication List    STOP taking these medications   atorvastatin 40 MG tablet Commonly known as:  LIPITOR   cholestyramine 4 g packet Commonly known as:  QUESTRAN   levofloxacin 25 MG/ML solution Commonly known as:  LEVAQUIN   ranitidine 150 MG/10ML syrup Commonly known as:  ZANTAC     TAKE these medications   acetaminophen 160 MG/5ML liquid Commonly known as:  TYLENOL Place 650 mg into feeding tube every 6 (six) hours as needed for pain.   amLODipine 5 MG tablet Commonly known as:  NORVASC Place 5 mg into feeding tube daily.   amoxicillin-clavulanate 400-57 MG/5ML suspension Commonly known as:  AUGMENTIN Place 10.9 mLs (875 mg total) into feeding tube every 12 (twelve) hours for 5 days.   baclofen 20 MG tablet Commonly known as:  LIORESAL Place 20 mg into feeding tube 3 (three) times daily.   bisacodyl 10 MG suppository Commonly known as:  DULCOLAX Place  1 suppository (10 mg total) rectally every Monday, Wednesday, Friday, and Saturday at 6 PM. Start taking on:  09/02/2017 What changed:    when to take this  reasons to take this   carvedilol 6.25 MG tablet Commonly known as:  COREG Place 6.25 mg into feeding tube 2 (two) times  daily.   cetirizine 10 MG tablet Commonly known as:  ZYRTEC Place 10 mg into feeding tube daily.   clopidogrel 75 MG tablet Commonly known as:  PLAVIX Place 75 mg into feeding tube daily.   feeding supplement (OSMOLITE 1.5 CAL) Liqd Place 237 mLs into feeding tube 4 (four) times daily. Provide 1420 KCL, 60 g Protein   feeding supplement (PRO-STAT SUGAR FREE 64) Liqd Place 30 mLs into feeding tube 2 (two) times daily.   ferrous sulfate 300 (60 Fe) MG/5ML syrup Place 325 mg into feeding tube daily.   free water Soln Place 200 mLs into feeding tube every 6 (six) hours.   guaiFENesin 100 MG/5ML Soln Commonly known as:  ROBITUSSIN Take 10 mLs (200 mg total) by mouth every 6 (six) hours as needed for cough or to loosen phlegm.   magnesium hydroxide 400 MG/5ML suspension Commonly known as:  MILK OF MAGNESIA Take 30 mLs by mouth daily as needed for mild constipation.   nitroGLYCERIN 0.4 MG SL tablet Commonly known as:  NITROSTAT Place 0.4 mg under the tongue every 5 (five) minutes as needed for chest pain.   polyethylene glycol packet Commonly known as:  MIRALAX / GLYCOLAX Take 17 g by mouth daily.   PROTONIX 40 mg/20 mL Pack Generic drug:  pantoprazole sodium Place 40 mg into feeding tube 2 (two) times daily.   RA SALINE ENEMA RE Place 1 application rectally daily as needed (constipation).   REFRESH PLUS 0.5 % Soln Generic drug:  Carboxymethylcellulose Sod PF Place 1 drop into both eyes every 8 (eight) hours as needed (dry eyes).   saccharomyces boulardii 250 MG capsule Commonly known as:  FLORASTOR Place 250 mg into feeding tube daily.   senna-docusate 8.6-50 MG tablet Commonly known as:   Senokot-S Take 2 tablets by mouth at bedtime. For bowels   traMADol 50 MG tablet Commonly known as:  ULTRAM Place 1 tablet (50 mg total) into feeding tube 2 (two) times daily. For pain What changed:  when to take this   VOLTAREN 1 % Gel Generic drug:  diclofenac sodium Apply 2 mg topically 4 (four) times daily. For pain       Major procedures and Radiology Reports - PLEASE review detailed and final reports for all details, in brief -    Ct Head Wo Contrast  Result Date: 08/29/2017 CLINICAL DATA:  Nausea and vomiting.  History of CVA with aphasia. EXAM: CT HEAD WITHOUT CONTRAST TECHNIQUE: Contiguous axial images were obtained from the base of the skull through the vertex without intravenous contrast. COMPARISON:  None. FINDINGS: Brain: There is no mass, hemorrhage or extra-axial collection. The size and configuration of the ventricles and extra-axial CSF spaces are normal. There are old lacunar infarcts of the left basal ganglia. There is hypoattenuation of the periventricular white matter, most commonly indicating chronic ischemic microangiopathy. Vascular: No abnormal hyperdensity of the major intracranial arteries or dural venous sinuses. No intracranial atherosclerosis. Skull: The visualized skull base, calvarium and extracranial soft tissues are normal. Sinuses/Orbits: No fluid levels or advanced mucosal thickening of the visualized paranasal sinuses. No mastoid or middle ear effusion. The orbits are normal. IMPRESSION: Old basal ganglia lacunar infarcts and chronic small vessel ischemia without acute intracranial abnormality. Electronically Signed   By: Ulyses Jarred M.D.   On: 08/29/2017 22:07   Ct Abdomen Pelvis W Contrast  Result Date: 08/26/2017 CLINICAL DATA:  Vomiting and lethargy today. History of stroke and feeding tube. EXAM: CT ABDOMEN AND PELVIS WITH CONTRAST  TECHNIQUE: Multidetector CT imaging of the abdomen and pelvis was performed using the standard protocol following bolus  administration of intravenous contrast. CONTRAST:  167mL ISOVUE-300 IOPAMIDOL (ISOVUE-300) INJECTION 61% COMPARISON:  None. FINDINGS: Lower chest: Lung bases demonstrate peribronchial thickening with peribronchial tree-in-bud infiltrates likely representing bronchopneumonia. Cardiac enlargement. Small esophageal hiatal hernia. Hepatobiliary: No focal liver lesions. Gallbladder is unremarkable. No bile duct dilatation, but there is a 4 mm stone in the distal common bile duct. Pancreas: Unremarkable. No pancreatic ductal dilatation or surrounding inflammatory changes. Spleen: Normal in size without focal abnormality. Adrenals/Urinary Tract: No adrenal gland nodules. Multiple cysts in the kidneys. Nephrograms are symmetrical. No hydronephrosis or hydroureter. Bladder appears intact but visualization is limited due to streak artifact from hip arthroplasty. Stomach/Bowel: Stomach, small bowel, and colon are not abnormally distended. Gastrostomy tube along the greater curvature of the stomach. Diffusely stool-filled colon. Prominent stool in the rectum without rectal wall thickening. No definite bowel inflammatory changes. Appendix is not identified. Vascular/Lymphatic: Aortic atherosclerosis. No enlarged abdominal or pelvic lymph nodes. Inferior vena cava is somewhat decompressed suggesting hypovolemia. Reproductive: Marked enlargement of the uterus with multiple calcifications consistent with large uterine fibroids. No adnexal masses are appreciated. Other: No free air or free fluid in the abdomen. Abdominal wall musculature appears intact. Musculoskeletal: Left hip hemiarthroplasty. No destructive bone lesions. IMPRESSION: 1. 4 mm stone in the distal common bile duct without proximal obstruction. 2. No evidence of bowel obstruction or inflammation. Prominent stool-filled colon and rectum. 3. Marked enlargement of the uterus with multiple calcifications consistent with large uterine fibroids. 4. Flattening of the  inferior vena cava suggest hypovolemia. 5. Peribronchial thickening with peribronchial tree in bud infiltrates likely representing bronchopneumonia. 6. Aortic atherosclerosis. Electronically Signed   By: Lucienne Capers M.D.   On: 08/26/2017 22:51   Dg Abd 2 Views  Result Date: 08/30/2017 CLINICAL DATA:  Persistent abdominal distension, possible ileus. Assess stool burden. EXAM: ABDOMEN - 2 VIEW COMPARISON:  Abdominal radiograph of August 28, 2017 FINDINGS: The colonic stool burden is markedly increased similar to that seen previously. There is a moderate amount of stool in the rectum. Multiple calcified uterine fibroids are present with skin within the known enlarged uterus. IMPRESSION: Persistent increased colonic stool burden. Somewhat increased rectal stool burden which may reflect a fecal impaction. Electronically Signed   By: David  Martinique M.D.   On: 08/30/2017 12:49   Dg Abdomen Acute W/chest  Result Date: 08/28/2017 CLINICAL DATA:  Complaining of constipation and abdominal distension for 3 days. EXAM: DG ABDOMEN ACUTE W/ 1V CHEST COMPARISON:  CT scan 08/26/2017 FINDINGS: The upright chest x-ray does not demonstrate any acute pulmonary findings. No pleural effusion or pneumothorax. The heart is normal in size. Mild tortuosity of the thoracic aorta. There is a feeding gastrostomy tube in place. No complicating features are identified. There is a large amount of stool throughout the colon suggesting constipation. Markedly enlarged fibroid uterus with calcified fibroids. No free air. Left hip prosthesis noted. IMPRESSION: 1. No acute pulmonary findings. 2. Feeding gastrostomy tube appears to be well positioned in the left upper mid abdomen. 3. Large amount of stool throughout the colon suggesting constipation. 4. Markedly enlarged fibroid uterus. Electronically Signed   By: Marijo Sanes M.D.   On: 08/28/2017 17:06   Dg Abd Acute W/chest  Result Date: 08/26/2017 CLINICAL DATA:  Vomiting. EXAM: DG  ABDOMEN ACUTE W/ 1V CHEST COMPARISON:  None. FINDINGS: Gastrostomy tube is in grossly good position. Large amount of stool seen throughout  the colon. No free air is noted to suggest pneumoperitoneum. No definite evidence of abnormal bowel dilatation. Heart size and mediastinal contours are within normal limits. Both lungs are clear. IMPRESSION: Gastrostomy tube in grossly good position. Large amount of stool seen throughout the colon suggesting constipation, with residual contrast seen in the sigmoid colon. No acute cardiopulmonary disease. Electronically Signed   By: Marijo Conception, M.D.   On: 08/26/2017 17:18    Micro Results   No results found for this or any previous visit (from the past 240 hour(s)).     Today   Subjective    Alyssa Savage today -----Patient's daughter at bedside, questions answered , continues to have soft stools, no new concerns,  tolerating PEG tube bolus feeding well        Patient has been seen and examined prior to discharge   Objective   Blood pressure 119/90, pulse 83, temperature 97.7 F (36.5 C), temperature source Oral, resp. rate 20, height 5\' 2"  (1.575 m), weight 50.2 kg (110 lb 10.7 oz), SpO2 99 %.   Intake/Output Summary (Last 24 hours) at 09/01/2017 1404 Last data filed at 09/01/2017 0700 Gross per 24 hour  Intake 934 ml  Output 300 ml  Net 634 ml    Exam Constitutional: Elderly female, lying in bed, no distress, not really verbal Eyes:anicteric, normal conjunctivae, EOMI Cardiovascular: RRR no MRGs, with no peripheral edema Respiratory: Normal respiratory effort, clear breath sounds  Abdomen: Soft, mildly distended, no appreciable tenderness, PEG tube in place with no surrounding redness or drainage non-tender, Skin: No rash ulcers, or lesions. Without skin tenting  Neurologic: Right-sided deficit, opens eyes to voice, tracks eyes while speaking,     Data Review   CBC w Diff:  Lab Results  Component Value Date   WBC 8.0 08/28/2017    HGB 13.6 08/28/2017   HCT 42.8 08/28/2017   PLT 426 (H) 08/28/2017   LYMPHOPCT 23 08/28/2017   MONOPCT 8 08/28/2017   EOSPCT 4 08/28/2017   BASOPCT 0 08/28/2017    CMP:  Lab Results  Component Value Date   NA 141 08/31/2017   K 3.3 (L) 08/31/2017   CL 107 08/31/2017   CO2 26 08/31/2017   BUN 28 (H) 08/31/2017   CREATININE 0.60 08/31/2017   PROT 8.9 (H) 08/28/2017   ALBUMIN 3.5 08/28/2017   BILITOT 0.4 08/28/2017   ALKPHOS 87 08/28/2017   AST 32 08/28/2017   ALT 29 08/28/2017    Total Discharge time is about 33 minutes  Roxan Hockey M.D on 09/01/2017 at 2:04 PM   Go to [email or web address] - password [email or web address] for contact info  [facility] - Office  [phone]

## 2017-09-01 NOTE — Consult Note (Signed)
Consultation Note Date: 09/01/2017   Patient Name: Alyssa Savage  DOB: Dec 14, 1930  MRN: [id number]  Age / Sex: 84 y.o., female  PCP: Patient, No Pcp Per Referring Physician: Roxan Hockey, MD  Reason for Consultation: Establishing goals of care  HPI/Patient Profile: 84 y.o. female  with past medical history of stroke February 2019 with residual right-sided weakness, nonverbal/aphasia, and dysphagia (now PEG tube feeding and SNF placement), seen in ED earlier this week with N/V and abd distention and sent back to SNF with antibiotics for aspiration pneumonia admitted on 08/28/2017 with N/V, abd distention XRAY abd shows no obstruction or ileus but large stool burden throughout colon. Also found to be dehydrated with acute renal injury and hypernatremia. Renal injury has resolved and constipation is improving. Appears to be returning back to baseline mental status which is nonverbal but able to nod head but still struggling with ability to give thumbs up/down and utilize letter board per notes and RN report. CT head only shows old infarct.   Clinical Assessment and Goals of Care: I met today at Alyssa Savage bedside with daughter, Erline Levine, and joined by Alyssa Savage, Alyssa Savage, as well. Erline Levine shares her mother's QOL as independent although she never drove but rode the city bus everywhere. She also talks of how her mother never went to the doctor her entire life until her stroke but she always made sure Erline Levine had medical care growing up.   Erline Levine acknowledges the changes in QOL for her mother although she tries to make her comfortable in SNF by bringing roses from her mother's rose garden at her old home and surrounding her with familiar items that she knows brings her mother joy.   We were able to speak at length regarding complications and common signs of continuing decline. Erline Levine acknowledges gradual decline  especially in mental status and interactions with her mother. We discussed fully the implications of resuscitation (they both seem inclined for DNR but Erline Levine would like to think more before final decisions). I also discussed MOST form and provided them with a copy and gave Erline Levine a Hard Choices booklet.   All questions/concerns addressed. Emotional support provided.   Primary Decision Maker NEXT OF KIN daughter Erline Levine    SUMMARY OF RECOMMENDATIONS   - Considering DNR especially and aggressiveness of care - Would benefit from outpatient palliative to follow  Code Status/Advance Care Planning:  Full code - considering DNR   Symptom Management:   Per primary.   Palliative Prophylaxis:   Aspiration, Bowel Regimen, Delirium Protocol, Frequent Pain Assessment, Oral Care, Palliative Wound Care and Turn Reposition  Additional Recommendations (Limitations, Scope, Preferences):  Full Scope Treatment  Psycho-social/Spiritual:   Desire for further Chaplaincy support:no  Additional Recommendations: Caregiving  Support/Resources and Education on Hospice  Prognosis:   Overall prognosis extremely poor with high risk for acute decompensation.   Discharge Planning: Return to SNF with palliative to follow.      Primary Diagnoses: Present on Admission: . AKI (acute kidney injury) ([place])   I have  reviewed the medical record, interviewed the patient and family, and examined the patient. The following aspects are pertinent.  Past Medical History:  Diagnosis Date  . Stroke [facility])    Social History   Socioeconomic History  . Marital status: Unknown    Spouse name: Not on file  . Number of children: Not on file  . Years of education: Not on file  . Highest education level: Not on file  Occupational History  . Not on file  Social Needs  . Financial resource strain: Not on file  . Food insecurity:    Worry: Not on file    Inability: Not on file  . Transportation needs:     Medical: Not on file    Non-medical: Not on file  Tobacco Use  . Smoking status: Unknown If Ever Smoked  Substance and Sexual Activity  . Alcohol use: Not Currently  . Drug use: Not Currently  . Sexual activity: Not Currently  Lifestyle  . Physical activity:    Days per week: 0 days    Minutes per session: 0 min  . Stress: Not on file  Relationships  . Social connections:    Talks on phone: Never    Gets together: Not on file    Attends religious service: Not on file    Active member of club or organization: Not on file    Attends meetings of clubs or organizations: Not on file    Relationship status: Not on file  Other Topics Concern  . Not on file  Social History Narrative   Pt has moderate deficiencies r/t CVA in Feb of 2019. Pt is non-verbal, and bed bound. Unable to communicate needs.    History reviewed. No pertinent family history. Scheduled Meds: . amLODipine  5 mg Per Tube Daily  . amoxicillin-clavulanate  875 mg Per Tube Q12H  . atorvastatin  40 mg Per Tube q1800  . baclofen  20 mg Per Tube TID  . carvedilol  6.25 mg Per Tube BID  . clopidogrel  75 mg Per Tube Daily  . enoxaparin (LOVENOX) injection  40 mg Subcutaneous Q24H  . feeding supplement (OSMOLITE 1.5 CAL)  237 mL Per Tube QID  . feeding supplement (PRO-STAT SUGAR FREE 64)  30 mL Per Tube BID  . free water  200 mL Per Tube Q6H  . loratadine  10 mg Per Tube Daily  . pantoprazole sodium  40 mg Per Tube BID  . polyethylene glycol  17 g Per Tube Daily  . ranitidine  150 mg Per Tube Daily  . saccharomyces boulardii  250 mg Per Tube Daily  . senna-docusate  2 tablet Per Tube BID   Continuous Infusions: PRN Meds:.magnesium hydroxide, nitroGLYCERIN, polyvinyl alcohol No Known Allergies Review of Systems  Unable to perform ROS: Acuity of condition    Physical Exam  Constitutional: She appears cachectic. She has a sickly appearance.  Frail, elderly  Cardiovascular: Normal rate.  Pulmonary/Chest: No  accessory muscle usage. No tachypnea. She is in respiratory distress.  Secretions, gurgling, mild distress intermittently  Abdominal: She exhibits distension.  Distended but improving  Neurological: She is alert. She is disoriented.  Aphasic   Nursing note and vitals reviewed.   Vital Signs: BP (!) 146/93 (BP Location: Left Arm)   Pulse 83   Temp 97.7 F (36.5 C) (Oral)   Resp 20   Ht 5' 2"  (1.575 m)   Wt 50.2 kg (110 lb 10.7 oz)   SpO2 99%   BMI  20.24 kg/m  Pain Scale: Faces   Pain Score: 0-No pain   SpO2: SpO2: 99 % O2 Device:SpO2: 99 % O2 Flow Rate: .O2 Flow Rate (L/min): 2 L/min  IO: Intake/output summary:   Intake/Output Summary (Last 24 hours) at 09/01/2017 0809 Last data filed at 09/01/2017 0700 Gross per 24 hour  Intake 934 ml  Output 300 ml  Net 634 ml    LBM: Last BM Date: 08/31/17 Baseline Weight: Weight: 50.2 kg (110 lb 10.7 oz) Most recent weight: Weight: 50.2 kg (110 lb 10.7 oz)     Palliative Assessment/Data:     Time In: 1000 Time Out: 1145 Time Total: 105 min Greater than 50%  of this time was spent counseling and coordinating care related to the above assessment and plan.  Signed by: Vinie Sill, NP Palliative Medicine Team Pager # [phone] (M-F 8a-5p) Team Phone # [phone] (Nights/Weekends)

## 2017-09-01 NOTE — Care Management Important Message (Signed)
Important Message  Patient Details  Name: Anaija Wissink MRN: [id number] Date of Birth: 1930/11/05   Medicare Important Message Given:  Yes    Kerin Salen 09/01/2017, 10:59 AMImportant Message  Patient Details  Name: Mehak Roskelley MRN: [id number] Date of Birth: 09-17-1930   Medicare Important Message Given:  Yes    Kerin Salen 09/01/2017, 10:59 AM

## 2017-09-01 NOTE — Progress Notes (Signed)
Patient returning to [place] confirmed return with facility.   LCSW faxed dc docs to facility.   Patient will transport by [facility].   Family notified of transport.   RN report #: [place], Shawna Clamp [place] [phone]

## 2017-09-02 ENCOUNTER — Encounter: Payer: Self-pay | Admitting: Internal Medicine

## 2017-09-02 ENCOUNTER — Non-Acute Institutional Stay (SKILLED_NURSING_FACILITY): Payer: Medicare Other | Admitting: Internal Medicine

## 2017-09-02 DIAGNOSIS — R4182 Altered mental status, unspecified: Secondary | ICD-10-CM | POA: Diagnosis not present

## 2017-09-02 DIAGNOSIS — K209 Esophagitis, unspecified without bleeding: Secondary | ICD-10-CM

## 2017-09-02 DIAGNOSIS — Z8673 Personal history of transient ischemic attack (TIA), and cerebral infarction without residual deficits: Secondary | ICD-10-CM

## 2017-09-02 DIAGNOSIS — K5900 Constipation, unspecified: Secondary | ICD-10-CM | POA: Diagnosis not present

## 2017-09-02 DIAGNOSIS — I69391 Dysphagia following cerebral infarction: Secondary | ICD-10-CM | POA: Diagnosis not present

## 2017-09-02 DIAGNOSIS — I25118 Atherosclerotic heart disease of native coronary artery with other forms of angina pectoris: Secondary | ICD-10-CM

## 2017-09-02 MED ORDER — TRAMADOL HCL 50 MG PO TABS: 50.0000 mg | ORAL_TABLET | Freq: Three times a day (TID) | ORAL | 0 refills | Status: AC

## 2017-09-02 NOTE — Progress Notes (Deleted)
Opened in error

## 2017-09-02 NOTE — Progress Notes (Signed)
Nursing Home Room Number: 962I Place of Service:  SNF (31)   CODE STATUS: Full Code  No Known Allergies  Chief Complaint  Patient presents with  . Hospitalization Follow-up  Status post hospitalization secondary to toxic metabolic encephalopathy  HPI:  Patient is an 84 year old female with a history of CVA with baseline aphasia and right-sided deficits.  Resented to the ER from skilled nursing with vomiting and abdominal swelling.  She had been seen previously in the ED 2 days before for similar symptoms.  Labs at that time were unremarkable except a lipase of 158.  The scan did show a 4 mm gallstone no evidence of obstruction.  Patient was discharged on Levaquin for presumed aspiration pneumonia.  Patient had 2 more episodes of nonbilious emesis prior most recent admission with abdominal swelling.  Abdominal x-ray in emergency department showed a large amount of stool throughout the colon suggesting constipation and a fibrous uterus.  She was admitted for constipation as well as hypernatremia.  With aggressive laxatives and suppository she did have multiple loose stools.  Apparently her mental status improved somewhat during admission although she is not really speaking --lethargy did improve she was more alert.  Was able to open her eyes and follow during conversation.  Palliative care was consulted and apparently will be following up about goals of care.  Regards to dysphasia with hypernatremia recommendation was to give H2 water flushes through PEG tube and continue PEG tube feedings  Constipation was treated with MiraLAX daily as well as senna at night Dulcolax suppository Monday Wednesdays Fridays and Saturdays- continues on these medications with recommendation to continue free water flushes as well.  For pneumonia her Levaquin was switched to Augmentin for 5 additional days status post discharge  For history of CVA continues on baclofen 3 times a  day.  Regards to GERD esophagitis she did have significant anemia at one point and required transfusion during previous  hospitalization- she is now on Protonix--appears during this hospitalization hemoglobin was stable at 13.6--she is also on iron  Regards to hypertension shehass been continued on Norvasc and Coreg  For coronary artery disease she does continue on Plavix as well as Coreg.   Her pain issues she does continue on tramadol twice a day.  Currently vital signs are stable  Past Medical History:  Diagnosis Date  . Abnormal liver function test   . Cerebral infarct ([place])   . Cognitive communication deficit   . Difficulty walking   . Dysphagia   . Femoral neck fracture ([place]) 03/2017   left   . Gastrostomy status ([place])   . GERD (gastroesophageal reflux disease)   . Hemiparesis ([place])   . Hemiplegia ([place])   . Hyperlipidemia   . Hypertensive emergency 03/2017  . Hypertensive heart disease   . Hypokalemia 03/2017  . Muscle weakness   . Other hyperlipidemia   . Pain in left hip   . Presence of artificial hip, left   . Respiratory symptoms   . Severe protein-calorie malnutrition ([place])   . Stroke [facility])     Past Surgical History:  Procedure Laterality Date  . ANTERIOR APPROACH HEMI HIP ARTHROPLASTY Left 03/25/2017   Procedure: ANTERIOR APPROACH HEMI HIP ARTHROPLASTY;  Surgeon: Rod Can, MD;  Location: [facility];  Service: Orthopedics;  Laterality: Left;  . ESOPHAGOGASTRODUODENOSCOPY (EGD) WITH PROPOFOL N/A 07/20/2017   Procedure: ESOPHAGOGASTRODUODENOSCOPY (EGD) WITH PROPOFOL;  Surgeon: Ronnette Juniper, MD;  Location: [facility];  Service: Gastroenterology;  Laterality: N/A;  .  IR GASTROSTOMY TUBE MOD SED  03/31/2017    Social History   Socioeconomic History  . Marital status: Widowed    Spouse name: Not on file  . Number of children: Not on file  . Years of education: Not on file  . Highest education level: Not on file  Occupational History  . Not on file   Social Needs  . Financial resource strain: Not on file  . Food insecurity:    Worry: Not on file    Inability: Not on file  . Transportation needs:    Medical: Not on file    Non-medical: Not on file  Tobacco Use  . Smoking status: Never Smoker  . Smokeless tobacco: Never Used  Substance and Sexual Activity  . Alcohol use: No    Frequency: Never  . Drug use: No  . Sexual activity: Not on file  Lifestyle  . Physical activity:    Days per week: Not on file    Minutes per session: Not on file  . Stress: Not on file  Relationships  . Social connections:    Talks on phone: Not on file    Gets together: Not on file    Attends religious service: Not on file    Active member of club or organization: Not on file    Attends meetings of clubs or organizations: Not on file    Relationship status: Not on file  . Intimate partner violence:    Fear of current or ex partner: Not on file    Emotionally abused: Not on file    Physically abused: Not on file    Forced sexual activity: Not on file  Other Topics Concern  . Not on file  Social History Narrative   Widowed   + children   No EtOH, tobacco   Family History  Problem Relation Age of Onset  . CAD Mother   . Heart disease Mother    Review of systems this is unobtainable secondary to patient being nonverbal-please see HPI- acute changes--presentation today appears to be in line with her presentation in hospital right before discharge  VITAL SIGNS BP 128/64   Pulse 67   Temp 99 F (37.2 C)   Resp 18   Ht 5\' 3"  (1.6 m)   Wt 110 lb (49.9 kg)   BMI 19.49 kg/m   In general this is a frail elderly female she does not appear to be in any distress she is nonverbal but does open her eyes to verbal cues and appears to follow conversation somewhat  Her skin is warm and dry.  Eyes are anicteric extraocular movements appear to be intact she does open her eyes to verbal cues  Oropharynx she holds her mouth closed position this  was somewhat difficult to assess  Chest is clear to auscultation with shallow respiratory effort there is no labored breathing   Heart is regular rate and rhythm without murmur gallop or rub--she does not have significant lower extremity edema   Her abdomen is somewhat protuberant which appears to be baseline it is soft nontender- PEG site appears within normal limits without concerning drainage or bleeding or erythema  Bowel sounds are active   Musculoskeletal continues with right-sided hemiparalysis and baseline frailty   Neurologic as noted above she is alert does open her eyes to verbal stimuli and appears to follow conversation but does not speak   Psych as noted above no agitation has been noted     Outpatient Encounter  Medications as of 09/02/2017  Medication Sig  . acetaminophen (TYLENOL) 325 MG tablet Place 650 mg into feeding tube every 6 (six) hours as needed for mild pain or moderate pain. Notify MD if not relieved. Special Requirement - Pre-Pain (Physician Order) Not to exceed 3000mg  in 24 hour period   . Amino Acids-Protein Hydrolys (FEEDING SUPPLEMENT, PRO-STAT SUGAR FREE 64,) LIQD Place 30 mLs into feeding tube 2 (two) times daily.   Marland Kitchen amLODipine (NORVASC) 5 MG tablet Place 5 mg into feeding tube daily.  Marland Kitchen amoxicillin-clavulanate (AUGMENTIN) 400-57 MG/5ML suspension Take by mouth. 10.9 mls (875 mg total) into feeding tube every 12 (twelve) hours for 5 days.  Marland Kitchen atorvastatin (LIPITOR) 40 MG tablet Place 1 tablet (40 mg total) into feeding tube daily at 6 PM.  . baclofen (LIORESAL) 20 MG tablet Take 20 mg by mouth 3 (three) times daily. By feeding tube  . bisacodyl (DULCOLAX) 10 MG suppository Place 1 suppository (10 mg total) rectally daily as needed for moderate constipation.  . carboxymethylcellulose (REFRESH PLUS) 0.5 % SOLN Place 1 drop into both eyes every 8 (eight) hours as needed (dry eyes).  . carvedilol (COREG) 6.25 MG tablet Place 6.25 mg into feeding tube 2  (two) times daily with a meal. HYPERTENSIVE HEART DISEASE WITH HEART FAILURE  . cetirizine (ZYRTEC) 10 MG tablet Place 10 mg into feeding tube daily.  . cholestyramine (QUESTRAN) 4 g packet Place 4 g into feeding tube 2 (two) times daily. Mixed with 4 ounces of water  . clopidogrel (PLAVIX) 75 MG tablet Take 75 mg by mouth daily. By feeding tube  . diclofenac sodium (VOLTAREN) 1 % GEL Apply 2 g topically 4 (four) times daily.  . ferrous sulfate 300 (60 Fe) MG/5ML syrup Take 300 mg by mouth daily. 325mg  daily via peg tube for anemia  . Foot Care Products [facility]) MISC by Does not apply route. Boots to bilateral feet QD- while in bed-may remove for AM/PM care 7 am on/off 3 pm on/off 11 pm on/off  . guaiFENesin (ROBITUSSIN) 100 MG/5ML liquid Take 200 mg by mouth. 10 mls (200 mg total) by mouth every 6 hours as needed for cough or to lossen phlegm.  . magnesium hydroxide (MILK OF MAGNESIA) 400 MG/5ML suspension Place 30 mLs into feeding tube daily as needed for mild constipation.  . nitroGLYCERIN (NITROSTAT) 0.4 MG SL tablet Place 1 tablet (0.4 mg total) under the tongue every 5 (five) minutes as needed for chest pain.  . Nutritional Supplements (FEEDING SUPPLEMENT, OSMOLITE 1.5 CAL,) LIQD Place 1,000 mLs into feeding tube 4 (four) times daily. 1425 Kcal and 66g Pro daily ( May cont Osmolite 1.2 until Osmolite 1.5 available)  . pantoprazole sodium (PROTONIX) 40 mg/20 mL PACK Place 40 mg into feeding tube 2 (two) times daily. By feeding tube  . saccharomyces boulardii (FLORASTOR) 250 MG capsule Take 250 mg by mouth daily. By feeding tube  . Sodium Phosphates (RA SALINE ENEMA RE) Place 1 application rectally as needed (constipation).  . traMADol (ULTRAM) 50 MG tablet Take 1 tablet (50 mg total) by mouth 3 (three) times daily.  . Water For Irrigation, Sterile (FREE WATER) SOLN Place 200 mLs into feeding tube every 6 (six) hours.  . [DISCONTINUED] ranitidine (ZANTAC) 150 MG/10ML syrup Place 10 mLs (150  mg total) into feeding tube daily.  . [DISCONTINUED] traMADol (ULTRAM) 50 MG tablet Take 1 tablet (50 mg total) by mouth 3 (three) times daily.   No facility-administered encounter medications on file as of  09/02/2017.    Labs.  August 31, 2017.  Sodium 141 potassium 3.3 BUN 28 creatinine 0.6.  28th 2019.  WBC 8.6 hemoglobin 13.6 platelets 426.  Liver function test within normal limits albumin was 3.5.  August 29, 2017-lipase was 31 this appears to be trending down         ASSESSMENT/ PLAN:  #1- altered mental status secondary to metabolic encephalopathy- apparently this improved with treatment of electrolyte abnormalities infection treatment for pneumonia.  Continues to be largely nonverbal but does open her eyes  To  speech and appears to follow at times. Has been put on a fairly Aggressive bowel regimen- CT scan in hospital did not show any acute abnormality.  Palliative care consult was pursued in the hospital this will need follow-upat the facility  #2 dysphagia-again H2O water flushes have been ordered- continues on PEG feedings- at this point continue to monitor.  3.  History ofconstipation- this apparently improved on changed bowel regimen now on MiraLAX and senna daily as well as Dulcolax suppository every Monday Wednesday Friday and Saturday.  Imaging did not show a bowel obstruction her vomiting has resolved encourage free water flushes which will help as well  #4- history of hypertension at this point will monitor appears to be relatively stable on Norvasc and Coreg  5.  History of coronary artery disease continues on Plavix as well as Coreg continue to monitor. She also is on a statin as well as nitroglycerin as needed  6.  History of CVA with right-sided deficits-continues on baclofen 20 mg 3 times daily as well as Plavix- also has tramadol twice daily.  For any associated pain as well as Voltaren gel  7.-  History of aspiration pneumonia- she is now on  Augmentin completing a course of this for 5 days- continues on Robitussin as needed as well  #8- history of GERD with esophagitis- at one point had significant anemia but this appears to have responded well she is on Protonix as well as iron hemoglobin is 13.6 on July 28    Patient will need palliative care follow-up- will obtain labs early next week-I do note potassium was slightly low and will supplement this 20 mEq   Potassium a day and have this updated on Monday     [id number]-of note greater than 50 minutes spent assessing patient- discussing her status with nursing staff-reviewing her chart and labs- and coordinating and formulating a plan of care for numerous diagnoses- of note greater than 50% of time spent coordinating a plan of care    [place], [facility]  Contact [phone] Monday through Friday 8am- 5pm  After hours call [phone]

## 2017-09-05 ENCOUNTER — Encounter: Payer: Self-pay | Admitting: Internal Medicine

## 2017-09-05 ENCOUNTER — Non-Acute Institutional Stay (SKILLED_NURSING_FACILITY): Payer: Medicare Other | Admitting: Internal Medicine

## 2017-09-05 DIAGNOSIS — G9341 Metabolic encephalopathy: Secondary | ICD-10-CM | POA: Diagnosis not present

## 2017-09-05 DIAGNOSIS — R4702 Dysphasia: Secondary | ICD-10-CM

## 2017-09-05 DIAGNOSIS — I10 Essential (primary) hypertension: Secondary | ICD-10-CM

## 2017-09-05 DIAGNOSIS — J690 Pneumonitis due to inhalation of food and vomit: Secondary | ICD-10-CM

## 2017-09-05 DIAGNOSIS — I6930 Unspecified sequelae of cerebral infarction: Secondary | ICD-10-CM

## 2017-09-05 DIAGNOSIS — K2100 Gastro-esophageal reflux disease with esophagitis, without bleeding: Secondary | ICD-10-CM

## 2017-09-05 DIAGNOSIS — I69391 Dysphagia following cerebral infarction: Secondary | ICD-10-CM

## 2017-09-05 DIAGNOSIS — K5900 Constipation, unspecified: Secondary | ICD-10-CM

## 2017-09-05 DIAGNOSIS — J180 Bronchopneumonia, unspecified organism: Secondary | ICD-10-CM

## 2017-09-05 DIAGNOSIS — E870 Hyperosmolality and hypernatremia: Secondary | ICD-10-CM

## 2017-09-05 NOTE — Progress Notes (Signed)
:  Location:  [facility] Room Number: 601U Place of Service:  SNF (31)   Anne D. [name], MD  Patient Care Team: Hennie Duos, MD as PCP - General (Internal Medicine) Minus Breeding, MD as PCP - Cardiology (Cardiology)  Extended Emergency Contact Information Primary Emergency Contact: Lenore Cordia Mobile Phone: [phone] Relation: Son Secondary Emergency Contact: cromati,eartha Home Phone: [phone] Relation: Daughter     Allergies: Patient has no known allergies.  Chief Complaint  Patient presents with  . Readmit To SNF    Admit to [facility]    HPI: Patient is 84 y.o. female with history of stroke in 03-2017 who was brought into the ED for the second time in a week for vomiting.  History as per daughter at the bedside, patient is nonverbal.  Per daughter she was vomiting 2 days ago, was seen in ED and sent back to [place] with antibiotics for pneumonia.  She returns today for same complaint, x-ray does not show obstruction or ileus but a large stool throughout the colon.  As per daughter patient had no fever chills.  In the ED lab work showed a sodium of 148, creatinine 1.06 lipase of 89 improved from 158 and abdominal x-ray showed large stool throughout the colon.  In the ED PA was able to take out impacted stool.  She was admitted to [facility] from 11/20 8-8/1 where patient was treated for dehydration and hypernatremia as well as severe constipation.  Patient was seen by palliative care where most, forms were given to family.  Patient is admitted back to skilled nursing facility for residential care.  While at skilled nursing facility patient will be followed for hypertension treated with Norvasc and Coreg,, history of CVA treated with Plavix  and GERD treated with Protonix.  Past Medical History:  Diagnosis Date  . Abnormal liver function test   . Cerebral infarct ([place])   . Cognitive communication deficit   .  Difficulty walking   . Dysphagia   . Femoral neck fracture ([place]) 03/2017   left   . Gastrostomy status ([place])   . GERD (gastroesophageal reflux disease)   . Hemiparesis ([place])   . Hemiplegia ([place])   . Hyperlipidemia   . Hypertensive emergency 03/2017  . Hypertensive heart disease   . Hypokalemia 03/2017  . Muscle weakness   . Other hyperlipidemia   . Pain in left hip   . Presence of artificial hip, left   . Respiratory symptoms   . Severe protein-calorie malnutrition ([place])   . Stroke [facility])     Past Surgical History:  Procedure Laterality Date  . ANTERIOR APPROACH HEMI HIP ARTHROPLASTY Left 03/25/2017   Procedure: ANTERIOR APPROACH HEMI HIP ARTHROPLASTY;  Surgeon: Rod Can, MD;  Location: [facility];  Service: Orthopedics;  Laterality: Left;  . ESOPHAGOGASTRODUODENOSCOPY (EGD) WITH PROPOFOL N/A 07/20/2017   Procedure: ESOPHAGOGASTRODUODENOSCOPY (EGD) WITH PROPOFOL;  Surgeon: Ronnette Juniper, MD;  Location: [facility];  Service: Gastroenterology;  Laterality: N/A;  . IR GASTROSTOMY TUBE MOD SED  03/31/2017    Allergies as of 09/05/2017   No Known Allergies     Medication List        Accurate as of 09/05/17  2:02 PM. Always use your most recent med list.          acetaminophen 325 MG tablet Commonly known as:  TYLENOL Place 650 mg into feeding tube every 6 (six) hours as needed for mild pain or moderate  pain. Notify MD if not relieved. Special Requirement - Pre-Pain (Physician Order) Not to exceed 3000mg  in 24 hour period   amLODipine 5 MG tablet Commonly known as:  NORVASC Place 5 mg into feeding tube daily.   amoxicillin-clavulanate 400-57 MG/5ML suspension Commonly known as:  AUGMENTIN Take by mouth. 10.9 mls (875 mg total) into feeding tube every 12 (twelve) hours for 5 days.   atorvastatin 40 MG tablet Commonly known as:  LIPITOR Place 1 tablet (40 mg total) into feeding tube daily at 6 PM.   baclofen 20 MG tablet Commonly known as:  LIORESAL Take 20 mg by mouth 3  (three) times daily. By feeding tube   bisacodyl 10 MG suppository Commonly known as:  DULCOLAX Place 1 suppository (10 mg total) rectally daily as needed for moderate constipation.   carboxymethylcellulose 0.5 % Soln Commonly known as:  REFRESH PLUS Place 1 drop into both eyes every 8 (eight) hours as needed (dry eyes).   carvedilol 6.25 MG tablet Commonly known as:  COREG Place 6.25 mg into feeding tube 2 (two) times daily with a meal. HYPERTENSIVE HEART DISEASE WITH HEART FAILURE   cetirizine 10 MG tablet Commonly known as:  ZYRTEC Place 10 mg into feeding tube daily.   cholestyramine 4 g packet Commonly known as:  QUESTRAN Place 4 g into feeding tube 2 (two) times daily. Mixed with 4 ounces of water   clopidogrel 75 MG tablet Commonly known as:  PLAVIX Take 75 mg by mouth daily. By feeding tube   diclofenac sodium 1 % Gel Commonly known as:  VOLTAREN Apply 2 g topically 4 (four) times daily.   feeding supplement (OSMOLITE 1.5 CAL) Liqd Place 1,000 mLs into feeding tube 4 (four) times daily. 1425 Kcal and 66g Pro daily ( May cont Osmolite 1.2 until Osmolite 1.5 available)   feeding supplement (PRO-STAT SUGAR FREE 64) Liqd Place 30 mLs into feeding tube 2 (two) times daily.   ferrous sulfate 300 (60 Fe) MG/5ML syrup Take 300 mg by mouth daily. 325mg  daily via peg tube for anemia   free water Soln Place 200 mLs into feeding tube every 6 (six) hours.   guaiFENesin 100 MG/5ML liquid Commonly known as:  ROBITUSSIN Take 200 mg by mouth. 10 mls (200 mg total) by mouth every 6 hours as needed for cough or to lossen phlegm.   magnesium hydroxide 400 MG/5ML suspension Commonly known as:  MILK OF MAGNESIA Place 30 mLs into feeding tube daily as needed for mild constipation.   nitroGLYCERIN 0.4 MG SL tablet Commonly known as:  NITROSTAT Place 1 tablet (0.4 mg total) under the tongue every 5 (five) minutes as needed for chest pain.   pantoprazole sodium 40 mg/20 mL  Pack Commonly known as:  PROTONIX Place 40 mg into feeding tube 2 (two) times daily. By feeding tube   PREVALON Misc by Does not apply route. Boots to bilateral feet QD- while in bed-may remove for AM/PM care 7 am on/off 3 pm on/off 11 pm on/off   RA SALINE ENEMA RE Place 1 application rectally as needed (constipation).   saccharomyces boulardii 250 MG capsule Commonly known as:  FLORASTOR Take 250 mg by mouth daily. By feeding tube   traMADol 50 MG tablet Commonly known as:  ULTRAM Take 1 tablet (50 mg total) by mouth 3 (three) times daily.       No orders of the defined types were placed in this encounter.    There is no immunization history on file  for this patient.  Social History   Tobacco Use  . Smoking status: Never Smoker  . Smokeless tobacco: Never Used  Substance Use Topics  . Alcohol use: No    Frequency: Never    Family history is   Family History  Problem Relation Age of Onset  . CAD Mother   . Heart disease Mother       Review of Systems  DATA OBTAINED: from nurse, patient is nonverbal GENERAL:  no fevers, fatigue, appetite changes SKIN: No itching, or rash EYES: No eye pain, redness, discharge EARS: No earache, tinnitus, change in hearing NOSE: No congestion, drainage or bleeding  MOUTH/THROAT: No mouth or tooth pain, No sore throat RESPIRATORY: No cough, wheezing, SOB CARDIAC: No chest pain, palpitations, lower extremity edema  GI: No abdominal pain, No N/V/D or constipation, No heartburn or reflux  GU: No dysuria, frequency or urgency, or incontinence  MUSCULOSKELETAL: No unrelieved bone/joint pain NEUROLOGIC: No headache, dizziness or focal weakness PSYCHIATRIC: No c/o anxiety or sadness   Vitals:   09/05/17 1401  BP: 124/81  Pulse: 75  Resp: (!) 21  Temp: 98.2 F (36.8 C)    SpO2 Readings from Last 1 Encounters:  08/16/17 100%   Body mass index is 19.49 kg/m.     Physical Exam  GENERAL APPEARANCE: Alert,  non-conversant,  No acute distress appears considerably weaker and less responsive than prior SKIN: No diaphoresis rash HEAD: Normocephalic, atraumatic  EYES: Conjunctiva/lids clear. Pupils round, reactive. EOMs intact.  EARS: External exam WNL, canals clear. Hearing grossly normal.  NOSE: No deformity or discharge.  MOUTH/THROAT: Lips w/o lesions  RESPIRATORY: Breathing is even, unlabored. Lung sounds are clear   CARDIOVASCULAR: Heart RRR no murmurs, rubs or gallops. No peripheral edema.   GASTROINTESTINAL: Abdomen is soft, non-tender, not distended w/ normal bowel sounds. GENITOURINARY: Bladder non tender, not distended  MUSCULOSKELETAL: No abnormal joints or musculature NEUROLOGIC:  Cranial nerves 2-12 grossly intact except nonverbal; right-sided weakness PSYCHIATRIC: Mood and affect very flat, no behavioral issues  Patient Active Problem List   Diagnosis Date Noted  . Esophagitis 07/30/2017  . Acute blood loss anemia 07/30/2017  . Iron deficiency anemia 07/30/2017  . Hypophosphatemia 07/30/2017  . Coronary artery disease 07/30/2017  . Upper GI bleed 07/18/2017  . Anemia   . Hemoptysis 07/16/2017  . Status post insertion of percutaneous endoscopic gastrostomy (PEG) tube ([place]) 07/16/2017  . Acute hypernatremia 07/09/2017  . Dysphagia as late effect of stroke 07/09/2017  . Cognitive communication deficit 07/09/2017  . History of recurrent UTIs 07/09/2017  . Recurrent Clostridium difficile diarrhea 07/09/2017  . Severe protein-calorie malnutrition ([place]) 07/09/2017  . Hypertensive heart disease/LV Diastolic dysfunction 02/54/2706  . Positive D dimer 07/09/2017  . Troponin level elevated 07/09/2017  . Sacral decubitus ulcer, stage II 07/09/2017  . History of cerebrovascular accident (CVA) with residual deficit 06/26/2017  . Hemiparesis ([place]) 06/26/2017  . Abnormal liver function test 06/26/2017  . Normocytic anemia 06/26/2017  . Aphasia 06/26/2017  . Hyperlipidemia 06/26/2017    . Sacral decubitus ulcer, stage III ([place]) 06/26/2017  . Chronic diarrhea 06/26/2017  . Allergic rhinitis 06/26/2017  . Clostridium difficile diarrhea   . Uterine leiomyoma   . Rectal bleeding   . Nausea and vomiting 05/28/2017  . Constipation   . Acute CVA (cerebrovascular accident) ([place]) 04/22/2017  . Acute encephalopathy 04/19/2017  . UTI (urinary tract infection) 04/19/2017  . Dysphagia 04/19/2017  . Displaced fracture of left femoral neck ([place]) 03/25/2017  . Protein-calorie  malnutrition, severe 03/24/2017  . Fall 03/21/2017  . Fracture of femoral neck, left, closed ([place]) 03/21/2017  . AKI (acute kidney injury) ([place]) 03/21/2017  . Hypertensive emergency 03/21/2017  . Elevated troponin 03/21/2017  . Hypokalemia 03/21/2017  . Stroke ([place]) 03/21/2017  . GERD (gastroesophageal reflux disease)   . Closed fracture of left hip [facility])       Labs reviewed: Basic Metabolic Panel:    Component Value Date/Time   NA 147 (H) 07/21/2017 0825   NA 143 06/07/2017 1428   K 3.2 (L) 07/21/2017 0825   CL 116 (H) 07/21/2017 0825   CO2 23 07/21/2017 0825   GLUCOSE 102 (H) 07/21/2017 0825   BUN 16 07/21/2017 0825   BUN 15 06/07/2017 1428   CREATININE 0.63 07/21/2017 0825   CALCIUM 8.4 (L) 07/21/2017 0825   PROT 6.4 (L) 07/21/2017 0825   ALBUMIN 2.5 (L) 07/21/2017 0825   AST 36 07/21/2017 0825   ALT 36 07/21/2017 0825   ALKPHOS 76 07/21/2017 0825   BILITOT 0.9 07/21/2017 0825   GFRNONAA >60 07/21/2017 0825   GFRAA >60 07/21/2017 0825    Recent Labs    03/26/17 0215 03/27/17 0429 03/29/17 0917  07/19/17 0936 07/20/17 0602 07/21/17 0825  NA 140 139 143   < > 146* 145 147*  K 3.6 3.4* 3.7   < > 3.3* 3.5 3.2*  CL 109 109 109   < > 115* 115* 116*  CO2 21* 20* 24   < > 23 24 23   GLUCOSE 170* 178* 122*   < > 89 94 102*  BUN 25* 30* 27*   < > 31* 21* 16  CREATININE 1.41* 1.55* 1.18*   < > 0.72 0.70 0.63  CALCIUM 7.8* 7.8* 8.4*   < > 8.6* 8.6* 8.4*  MG 1.4* 1.9 1.7  --   --   --   1.7  PHOS 2.4* 2.2*  --   --   --   --  2.4*   < > = values in this interval not displayed.   Liver Function Tests: Recent Labs    07/09/17 1003 07/18/17 1627 07/21/17 0825  AST 25 36 36  ALT 28 35 36  ALKPHOS 70 67 76  BILITOT 0.3 0.3 0.9  PROT 7.1 5.7* 6.4*  ALBUMIN 2.6* 2.3* 2.5*   Recent Labs    05/27/17 2346  LIPASE 72*   Recent Labs    04/19/17 1421  AMMONIA 22   CBC: Recent Labs    07/09/17 1003  07/18/17 1627 07/19/17 0936 07/20/17 0602 07/21/17 0825  WBC 10.4   < > 7.7 7.5 7.6 9.4  NEUTROABS 7.8*  --  4.9  --   --  7.1  HGB 9.6*   < > 7.0* 10.8* 10.6* 11.9*  HCT 32.3*   < > 22.7* 33.3* 33.5* 36.8  MCV 90.0   < > 88.7 86.9 87.9 85.8  PLT 465*   < > 488* 430* 456* 400   < > = values in this interval not displayed.   Lipid Recent Labs    03/22/17 0344  CHOL 203*  HDL 68  LDLCALC 117*  TRIG 90    Cardiac Enzymes: Recent Labs    05/27/17 2346 05/28/17 0533 07/09/17 1003  TROPONINI 0.04* <0.03 0.03*   BNP: No results for input(s): BNP in the last 8760 hours. No results found for: [facility] Lab Results  Component Value Date   HGBA1C 6.3 (H) 03/22/2017   No results found for:  TSH Lab Results  Component Value Date   VITAMINB12 776 07/18/2017   Lab Results  Component Value Date   FOLATE 30.2 07/18/2017   Lab Results  Component Value Date   IRON 17 (L) 07/18/2017   TIBC 300 07/18/2017   FERRITIN 21 07/18/2017    Imaging and Procedures obtained prior to SNF admission: No results found.   Not all labs, radiology exams or other studies done during hospitalization come through on my EPIC note; however they are reviewed by me.    Assessment and Plan  Toxic metabolic encephalopathy- overall improved with treatment of electrolyte abnormalities as well as treatment of infection; much improved, now able to follow with her eyes during conversation and nod yes and no to questions; suspect was related to severe constipation with  dehydration and pneumonia; CT head showed old basal ganglia lacunar infarct with no acute abnormalities; palliative care consult on 09/01/2017 to discuss goals of ongoing care SNF -patient admitted back to skilled nursing facility for residential care; patient is much less interactive than prior; patient much weaker than prior  Dysphagia/dehydration/hypernatremia-patient totally dependent on PEG tube for nutrition; hypernatremia improved with IV fluid and increase free water flushes SNF _increase free water flushes 200 mL every 6 hours; keep head of bed at 45 degree angle while giving tube feeds and for at least 2 hours post tube feeding; continue Osmolite 1.5 Cal 237 into feeding tube 4 times daily  Severe constipation- large amounts of stool on x-ray on admission, status post disimpaction in ED; repeat x-ray showed continued large amount stool status post impaction by nursing on 7/30; vomiting resolved SNF -continue with MiraLAX daily, Senokot docusate nightly and Dulcolax suppositories 4 times a week; increased free water flushes  Recurrent pneumonia-aspiration pneumonia diagnosed in the ED on 7/26; patient was continued on Augmentin via PEG tube starting 7/29 SNF-continue Augmentin per tube 875 mg every 12 hours for 5 more days  History of CVA with right-sided deficits SND -continue Plavix 75 mg daily, continue baclofen 20 mg 3 times daily for contractures  GERD SNF -continue Protonix 40 mg twice daily  Hypertension SNF -controlled; continue Norvasc 5 mg and Coreg 6.25 twice daily both in feeding tube   Time spent greater than 35 minutes;> 50% of time with patient was spent reviewing records, labs, tests and studies, counseling and developing plan of care  Webb Silversmith D. [name], MD

## 2017-09-06 ENCOUNTER — Non-Acute Institutional Stay: Payer: Medicare Other | Admitting: Internal Medicine

## 2017-09-06 ENCOUNTER — Encounter: Payer: Self-pay | Admitting: Internal Medicine

## 2017-09-06 ENCOUNTER — Non-Acute Institutional Stay (SKILLED_NURSING_FACILITY): Payer: Medicare Other | Admitting: Internal Medicine

## 2017-09-06 DIAGNOSIS — R41841 Cognitive communication deficit: Secondary | ICD-10-CM

## 2017-09-06 DIAGNOSIS — Z931 Gastrostomy status: Secondary | ICD-10-CM

## 2017-09-06 DIAGNOSIS — Z7189 Other specified counseling: Secondary | ICD-10-CM

## 2017-09-06 DIAGNOSIS — R531 Weakness: Secondary | ICD-10-CM

## 2017-09-06 DIAGNOSIS — I69391 Dysphagia following cerebral infarction: Secondary | ICD-10-CM | POA: Diagnosis not present

## 2017-09-06 DIAGNOSIS — I6930 Unspecified sequelae of cerebral infarction: Secondary | ICD-10-CM | POA: Diagnosis not present

## 2017-09-06 DIAGNOSIS — I69351 Hemiplegia and hemiparesis following cerebral infarction affecting right dominant side: Secondary | ICD-10-CM

## 2017-09-06 DIAGNOSIS — R0689 Other abnormalities of breathing: Principal | ICD-10-CM | POA: Insufficient documentation

## 2017-09-06 DIAGNOSIS — R0600 Dyspnea, unspecified: Secondary | ICD-10-CM | POA: Insufficient documentation

## 2017-09-06 LAB — BASIC METABOLIC PANEL
BUN: 55 — AB (ref 4–21)
Creatinine: 0.9 (ref 0.5–1.1)
Glucose: 157
Potassium: 4.9 (ref 3.4–5.3)
Sodium: 141 (ref 137–147)

## 2017-09-06 LAB — CBC AND DIFFERENTIAL
HCT: 34 — AB (ref 36–46)
HEMOGLOBIN: 10.9 — AB (ref 12.0–16.0)
Platelets: 312 (ref 150–399)
WBC: 9

## 2017-09-06 NOTE — Progress Notes (Signed)
:  Location:  [facility] Room Number: 852D Place of Service:  SNF (31)  Anne D. [name], MD  Patient Care Team: Hennie Duos, MD as PCP - General (Internal Medicine) Minus Breeding, MD as PCP - Cardiology (Cardiology)  Extended Emergency Contact Information Primary Emergency Contact: Lenore Cordia Mobile Phone: [phone] Relation: Son Secondary Emergency Contact: cromati,eartha Home Phone: [phone] Relation: Daughter     Allergies: Patient has no known allergies.  Chief Complaint  Patient presents with  . Acute Visit    End of Life discussions    HPI: Patient is 84 y.o. female who is being seen today to discuss end-of-life care, discussions which were started by palliative care in the hospital.  As per H&P yesterday patient looks differently than I had seen her prior and is [employer] of breathing .  Palliative care has seen patient, discussed with me and we are getting family to the nursing home to go ahead and discuss what measures they wish to take for patient's end of life care.  Past Medical History:  Diagnosis Date  . Abnormal liver function test   . Cerebral infarct ([place])   . Cognitive communication deficit   . Difficulty walking   . Dysphagia   . Femoral neck fracture ([place]) 03/2017   left   . Gastrostomy status ([place])   . GERD (gastroesophageal reflux disease)   . Hemiparesis ([place])   . Hemiplegia ([place])   . Hyperlipidemia   . Hypertensive emergency 03/2017  . Hypertensive heart disease   . Hypokalemia 03/2017  . Muscle weakness   . Other hyperlipidemia   . Pain in left hip   . Presence of artificial hip, left   . Respiratory symptoms   . Severe protein-calorie malnutrition ([place])   . Stroke [facility])     Past Surgical History:  Procedure Laterality Date  . ANTERIOR APPROACH HEMI HIP ARTHROPLASTY Left 03/25/2017   Procedure: ANTERIOR APPROACH HEMI HIP ARTHROPLASTY;  Surgeon: Rod Can, MD;  Location:  [place];  Service: Orthopedics;  Laterality: Left;  . ESOPHAGOGASTRODUODENOSCOPY (EGD) WITH PROPOFOL N/A 07/20/2017   Procedure: ESOPHAGOGASTRODUODENOSCOPY (EGD) WITH PROPOFOL;  Surgeon: Ronnette Juniper, MD;  Location: [facility];  Service: Gastroenterology;  Laterality: N/A;  . IR GASTROSTOMY TUBE MOD SED  03/31/2017    Allergies as of 09/06/2017   No Known Allergies     Medication List        Accurate as of 09/06/17 11:59 PM. Always use your most recent med list.          acetaminophen 325 MG tablet Commonly known as:  TYLENOL Place 650 mg into feeding tube every 6 (six) hours as needed for mild pain or moderate pain. Notify MD if not relieved. Special Requirement - Pre-Pain (Physician Order) Not to exceed 3000mg  in 24 hour period   amLODipine 5 MG tablet Commonly known as:  NORVASC Place 5 mg into feeding tube daily.   baclofen 20 MG tablet Commonly known as:  LIORESAL Take 20 mg by mouth 3 (three) times daily. By feeding tube   bisacodyl 10 MG suppository Commonly known as:  DULCOLAX Place 1 suppository (10 mg total) rectally daily as needed for moderate constipation.   carboxymethylcellulose 0.5 % Soln Commonly known as:  REFRESH PLUS Place 1 drop into both eyes every 8 (eight) hours as needed (dry eyes).   carvedilol 6.25 MG tablet Commonly known as:  COREG Place 6.25 mg into feeding tube 2 (two) times daily  with a meal. HYPERTENSIVE HEART DISEASE WITH HEART FAILURE   cetirizine 10 MG tablet Commonly known as:  ZYRTEC Place 10 mg into feeding tube daily.   cholestyramine 4 g packet Commonly known as:  QUESTRAN Place 4 g into feeding tube 2 (two) times daily. Mixed with 4 ounces of water   clopidogrel 75 MG tablet Commonly known as:  PLAVIX Take 75 mg by mouth daily. By feeding tube   diclofenac sodium 1 % Gel Commonly known as:  VOLTAREN Apply 2 g topically 4 (four) times daily.   feeding supplement (OSMOLITE 1.5 CAL) Liqd Place 1,000 mLs into feeding tube 4  (four) times daily. 1425 Kcal and 66g Pro daily ( May cont Osmolite 1.2 until Osmolite 1.5 available)   feeding supplement (PRO-STAT SUGAR FREE 64) Liqd Place 30 mLs into feeding tube 2 (two) times daily.   ferrous sulfate 300 (60 Fe) MG/5ML syrup Take 300 mg by mouth daily. 325mg  daily via peg tube for anemia   free water Soln Place 200 mLs into feeding tube every 6 (six) hours.   guaiFENesin 100 MG/5ML liquid Commonly known as:  ROBITUSSIN Take 200 mg by mouth. 10 mls (200 mg total) by mouth every 6 hours as needed for cough or to lossen phlegm.   magnesium hydroxide 400 MG/5ML suspension Commonly known as:  MILK OF MAGNESIA Place 30 mLs into feeding tube daily as needed for mild constipation.   nitroGLYCERIN 0.4 MG SL tablet Commonly known as:  NITROSTAT Place 1 tablet (0.4 mg total) under the tongue every 5 (five) minutes as needed for chest pain.   pantoprazole sodium 40 mg/20 mL Pack Commonly known as:  PROTONIX Place 40 mg into feeding tube 2 (two) times daily. By feeding tube   PREVALON Misc by Does not apply route. Boots to bilateral feet QD- while in bed-may remove for AM/PM care 7 am on/off 3 pm on/off 11 pm on/off   RA SALINE ENEMA RE Place 1 application rectally as needed (constipation).   saccharomyces boulardii 250 MG capsule Commonly known as:  FLORASTOR Take 250 mg by mouth daily. By feeding tube   sennosides-docusate sodium 8.6-50 MG tablet Commonly known as:  SENOKOT-S Take 2 tablets by mouth at bedtime.   traMADol 50 MG tablet Commonly known as:  ULTRAM Take 1 tablet (50 mg total) by mouth 3 (three) times daily.       No orders of the defined types were placed in this encounter.    There is no immunization history on file for this patient.  Social History   Tobacco Use  . Smoking status: Never Smoker  . Smokeless tobacco: Never Used  Substance Use Topics  . Alcohol use: No    Frequency: Never    Family history is   Family  History  Problem Relation Age of Onset  . CAD Mother   . Heart disease Mother       Review of Systems  DATA OBTAINED: from nurse- patient has appeared unwell since returning from the hospital, no energy, minimal communication GENERAL:  no fevers, fatigue, appetite changes SKIN: No itching, or rash EYES: No eye pain, redness, discharge EARS: No earache, tinnitus, change in hearing NOSE: No congestion, drainage or bleeding  MOUTH/THROAT: No mouth or tooth pain, No sore throat RESPIRATORY: No cough, wheezing, SOB CARDIAC: No chest pain, palpitations, lower extremity edema  GI: No abdominal pain, No N/V/D or constipation, No heartburn or reflux  GU: No dysuria, frequency or urgency, or incontinence  MUSCULOSKELETAL: No unrelieved bone/joint pain NEUROLOGIC: No headache, dizziness or focal weakness PSYCHIATRIC: No c/o anxiety or sadness   Vitals:   09/06/17 1355  BP: 130/75  Pulse: 90  Resp: 16  Temp: 98.7 F (37.1 C)    SpO2 Readings from Last 1 Encounters:  08/16/17 100%   Body mass index is 19.49 kg/m.     Physical Exam  GENERAL APPEARANCE: Alert, non-conversant, SKIN: No diaphoresis rash HEAD: Normocephalic, atraumatic  EYES: Conjunctiva/lids clear. Pupils round, reactive. EOMs intact.  EARS: External exam WNL, canals clear. Hearing grossly normal.  NOSE: No deformity or discharge.  MOUTH/THROAT: Lips w/o lesions  RESPIRATORY: Cheyne-Stokes breathing; lung sounds are rhonchi CARDIOVASCULAR: Heart RRR no murmurs, rubs or gallops. No peripheral edema.   GASTROINTESTINAL: Abdomen is soft, non-tender, not distended w/ normal bowel sounds; PEG tube. GENITOURINARY: Bladder non tender, not distended  MUSCULOSKELETAL: Right-sided contractures NEUROLOGIC:  Cranial nerves 2-12 grossly intact except patient cannot speak or swallow; right-sided weakness; patient size look like she is ready to die PSYCHIATRIC: Very flat, no behavioral issues  Patient Active Problem List    Diagnosis Date Noted  . Acute metabolic encephalopathy 69/48/5462  . Dysphasia 09/10/2017  . Bronchopneumonia 09/10/2017  . Aspiration pneumonia ([place]) 09/10/2017  . Essential hypertension 09/10/2017  . Esophagitis 07/30/2017  . Acute blood loss anemia 07/30/2017  . Iron deficiency anemia 07/30/2017  . Hypophosphatemia 07/30/2017  . Coronary artery disease 07/30/2017  . Upper GI bleed 07/18/2017  . Anemia   . Hemoptysis 07/16/2017  . Status post insertion of percutaneous endoscopic gastrostomy (PEG) tube ([place]) 07/16/2017  . Acute hypernatremia 07/09/2017  . Dysphagia as late effect of stroke 07/09/2017  . Cognitive communication deficit 07/09/2017  . History of recurrent UTIs 07/09/2017  . Recurrent Clostridium difficile diarrhea 07/09/2017  . Severe protein-calorie malnutrition ([place]) 07/09/2017  . Hypertensive heart disease/LV Diastolic dysfunction 70/35/0093  . Positive D dimer 07/09/2017  . Troponin level elevated 07/09/2017  . Sacral decubitus ulcer, stage II 07/09/2017  . History of cerebrovascular accident (CVA) with residual deficit 06/26/2017  . Hemiparesis ([place]) 06/26/2017  . Abnormal liver function test 06/26/2017  . Normocytic anemia 06/26/2017  . Aphasia 06/26/2017  . Hyperlipidemia 06/26/2017  . Sacral decubitus ulcer, stage III ([place]) 06/26/2017  . Chronic diarrhea 06/26/2017  . Allergic rhinitis 06/26/2017  . Clostridium difficile diarrhea   . Uterine leiomyoma   . Rectal bleeding   . Nausea and vomiting 05/28/2017  . Constipation   . Acute CVA (cerebrovascular accident) ([place]) 04/22/2017  . Acute encephalopathy 04/19/2017  . UTI (urinary tract infection) 04/19/2017  . Dysphagia 04/19/2017  . Displaced fracture of left femoral neck ([place]) 03/25/2017  . Protein-calorie malnutrition, severe 03/24/2017  . Fall 03/21/2017  . Fracture of femoral neck, left, closed ([place]) 03/21/2017  . AKI (acute kidney injury) ([place]) 03/21/2017  . Hypertensive emergency 03/21/2017    . Elevated troponin 03/21/2017  . Hypokalemia 03/21/2017  . Stroke ([place]) 03/21/2017  . GERD (gastroesophageal reflux disease)   . Closed fracture of left hip [facility])       Labs reviewed: Basic Metabolic Panel:    Component Value Date/Time   NA 141 09/06/2017   K 4.9 09/06/2017   CL 116 (H) 07/21/2017 0825   CO2 23 07/21/2017 0825   GLUCOSE 102 (H) 07/21/2017 0825   BUN 55 (A) 09/06/2017   CREATININE 0.9 09/06/2017   CREATININE 0.63 07/21/2017 0825   CALCIUM 8.4 (L) 07/21/2017 0825   PROT 6.4 (L) 07/21/2017 0825  ALBUMIN 2.5 (L) 07/21/2017 0825   AST 36 07/21/2017 0825   ALT 36 07/21/2017 0825   ALKPHOS 76 07/21/2017 0825   BILITOT 0.9 07/21/2017 0825   GFRNONAA >60 07/21/2017 0825   GFRAA >60 07/21/2017 0825    Recent Labs    03/26/17 0215 03/27/17 0429 03/29/17 3662  07/19/17 0936 07/20/17 0602 07/21/17 0825 09/06/17  NA 140 139 143   < > 146* 145 147* 141  K 3.6 3.4* 3.7   < > 3.3* 3.5 3.2* 4.9  CL 109 109 109   < > 115* 115* 116*  --   CO2 21* 20* 24   < > 23 24 23   --   GLUCOSE 170* 178* 122*   < > 89 94 102*  --   BUN 25* 30* 27*   < > 31* 21* 16 55*  CREATININE 1.41* 1.55* 1.18*   < > 0.72 0.70 0.63 0.9  CALCIUM 7.8* 7.8* 8.4*   < > 8.6* 8.6* 8.4*  --   MG 1.4* 1.9 1.7  --   --   --  1.7  --   PHOS 2.4* 2.2*  --   --   --   --  2.4*  --    < > = values in this interval not displayed.   Liver Function Tests: Recent Labs    07/09/17 1003 07/18/17 1627 07/21/17 0825  AST 25 36 36  ALT 28 35 36  ALKPHOS 70 67 76  BILITOT 0.3 0.3 0.9  PROT 7.1 5.7* 6.4*  ALBUMIN 2.6* 2.3* 2.5*   Recent Labs    05/27/17 2346  LIPASE 72*   Recent Labs    04/19/17 1421  AMMONIA 22   CBC: Recent Labs    07/09/17 1003  07/18/17 1627 07/19/17 0936 07/20/17 0602 07/21/17 0825 09/06/17  WBC 10.4   < > 7.7 7.5 7.6 9.4 9.0  NEUTROABS 7.8*  --  4.9  --   --  7.1  --   HGB 9.6*   < > 7.0* 10.8* 10.6* 11.9* 10.9*  HCT 32.3*   < > 22.7* 33.3* 33.5* 36.8 34*   MCV 90.0   < > 88.7 86.9 87.9 85.8  --   PLT 465*   < > 488* 430* 456* 400 312   < > = values in this interval not displayed.   Lipid Recent Labs    03/22/17 0344  CHOL 203*  HDL 68  LDLCALC 117*  TRIG 90    Cardiac Enzymes: Recent Labs    05/27/17 2346 05/28/17 0533 07/09/17 1003  TROPONINI 0.04* <0.03 0.03*   BNP: No results for input(s): BNP in the last 8760 hours. No results found for: [facility] Lab Results  Component Value Date   HGBA1C 6.3 (H) 03/22/2017   No results found for: TSH Lab Results  Component Value Date   VITAMINB12 776 07/18/2017   Lab Results  Component Value Date   FOLATE 30.2 07/18/2017   Lab Results  Component Value Date   IRON 17 (L) 07/18/2017   TIBC 300 07/18/2017   FERRITIN 21 07/18/2017    Imaging and Procedures obtained prior to SNF admission: No results found.   Not all labs, radiology exams or other studies done during hospitalization come through on my EPIC note; however they are reviewed by me.    Assessment and Plan  Status post CVA with right-sided weakness/dysphasia/inability to speak/encounter for family conference/end-of-life discussions- in attendance are Enid Derry, palliative care, myself, Caren Griffins supervising  nurse for patient, Virl Axe, Education officer, museum and the patient's nephew who lives here, patient's daughter who is here from [place], and patient sister- family had many questions regarding patient's physical situation now and regarding the most form, all were answered and discussed; family appears to understand patient's decline and possible intermittent decline into death; patient's most form was filled out completely which includes DO NOT INTUBATE, no hospitalizations, comfort care, no antibiotics for her probable recurrent aspiration pneumonia; patient's family does wish to continue tube feedings and they understand that the nutrition will not be assimilated by the body in all probability and at some point patient  will vomit and at that time patient's nephew will decide whether to stop the feeding or not.   Time spent greater than 75 minutes Anne D. [name], MD

## 2017-09-06 NOTE — Progress Notes (Signed)
PALLIATIVE CARE CONSULT VISIT   PATIENT NAME: Alyssa Savage DOB: May 07, 1930 MRN: [id number]  PRIMARY CARE PROVIDER:   Dr.  Kingsley [place]  REFERRING PROVIDER:  Dr. Inocencio Homes RESPONSIBLE PARTY:  Adele Barthel) [phone], Erline Levine Cromarti(daughter) who lives in [place]         RECOMMENDATIONS and PLAN:   1.  Dyspnea and respiratory abnormalities  R06.00:  Acute change with [facility] respirations today. Approaching end of life.  Immediate conference wife PCP and family.  2.  Weakness  R53.1:  Chronic with progressive decline. End of life trajectory.  3.  Advanced care planning Z71.89:  Joint meeting with daughter, cousin and pt's sister along with Dr. [name], SW and RN Caren Griffins with current pt status which is signs of end of life and failure to thrive.  Reviewed advanced care planning and goals of care with family. Their goal is to help patient remain comfortable at snf.  Code status was selected today as DNAR/ DNI,  No return to the hospital, no antibiotics or IV fluids.  Continue tube feedings for now.  Do not discontinue tube feedings until further discussion with family members. MOST form and golden rod form completed and given to SW for filing.  Hospice services were introduced and family is in agreement with Hospice care if cleared through business department.  Hospice referral made by Dr. [name].   I spent 195 minutes providing this consultation,  from 11:30am to 2:45pm at [facility]. More than 50% of the time in this consultation was spent coordinating communication with patient, daughter, cousin, pt's sister and facility staff.   HISTORY OF PRESENT ILLNESS:  Alyssa Catalina Basham is a 84 y.o. year old female with multiple medical problems including R hemiplegia post CVA, dysphagia with PEG tube placement and feedings.  Recent hospitalization from 7/28-09/01/17 due to vomiting , increased weakness, dehydration and pneumonia.   She was evaluated in the ER on 08/26/17 due to AMS and suspected PNA. Staff reports that prior to hospitalization, pt was able to use a point board to communicate her needs since she was non-verbal, non-ambulatory.  Since return from the hospital, she has increased sleeping and makes no attempt at communication. She receives bolus tube feedings and is total care for all ADLs.  She is incontinent of B&B.  Palliative Care was asked to help address goals of care and assist with advance care planning.    CODE STATUS: DNR/DNI/DNH  PPS: 10%  Tube feedings only HOSPICE ELIGIBILITY/DIAGNOSIS: YES/ Neuromuscular disease  PAST MEDICAL HISTORY:  Past Medical History:  Diagnosis Date  . Stroke [facility])     SOCIAL HX:  Social History   Tobacco Use  . Smoking status: Unknown If Ever Smoked  Substance Use Topics  . Alcohol use: Not Currently    ALLERGIES: No Known Allergies   PERTINENT MEDICATIONS:  Outpatient Encounter Medications as of 09/06/2017  Medication Sig  . acetaminophen (TYLENOL) 160 MG/5ML liquid Place 650 mg into feeding tube every 6 (six) hours as needed for pain.   . Amino Acids-Protein Hydrolys (FEEDING SUPPLEMENT, PRO-STAT SUGAR FREE 64,) LIQD Place 30 mLs into feeding tube 2 (two) times daily.   Marland Kitchen amLODipine (NORVASC) 5 MG tablet Place 5 mg into feeding tube daily.  Marland Kitchen amoxicillin-clavulanate (AUGMENTIN) 400-57 MG/5ML suspension Place 10.9 mLs (875 mg total) into feeding tube every 12 (twelve) hours for  5 days.  . baclofen (LIORESAL) 20 MG tablet Place 20 mg into feeding tube 3 (three) times daily.  . bisacodyl (DULCOLAX) 10 MG suppository Place 1 suppository (10 mg total) rectally every Monday, Wednesday, Friday, and Saturday at 6 PM.  . Carboxymethylcellulose Sod PF (REFRESH PLUS) 0.5 % SOLN Place 1 drop into both eyes every 8 (eight) hours as needed (dry eyes).  . carvedilol (COREG) 6.25 MG tablet Place 6.25 mg into feeding tube 2 (two) times daily.   . cetirizine (ZYRTEC) 10 MG  tablet Place 10 mg into feeding tube daily.  . clopidogrel (PLAVIX) 75 MG tablet Place 75 mg into feeding tube daily.   . ferrous sulfate 300 (60 Fe) MG/5ML syrup Place 325 mg into feeding tube daily.  Marland Kitchen guaiFENesin (ROBITUSSIN) 100 MG/5ML SOLN Take 10 mLs (200 mg total) by mouth every 6 (six) hours as needed for cough or to loosen phlegm.  . magnesium hydroxide (MILK OF MAGNESIA) 400 MG/5ML suspension Take 30 mLs by mouth daily as needed for mild constipation.  . nitroGLYCERIN (NITROSTAT) 0.4 MG SL tablet Place 0.4 mg under the tongue every 5 (five) minutes as needed for chest pain.  . Nutritional Supplements (FEEDING SUPPLEMENT, OSMOLITE 1.5 CAL,) LIQD Place 237 mLs into feeding tube 4 (four) times daily. Provide 1420 KCL, 60 g Protein  . polyethylene glycol (MIRALAX / GLYCOLAX) packet Take 17 g by mouth daily.  Marland Kitchen PROTONIX 40 MG PACK Place 40 mg into feeding tube 2 (two) times daily.   Marland Kitchen saccharomyces boulardii (FLORASTOR) 250 MG capsule Place 250 mg into feeding tube daily.  Marland Kitchen senna-docusate (SENOKOT-S) 8.6-50 MG tablet Take 2 tablets by mouth at bedtime. For bowels  . Sodium Phosphates (RA SALINE ENEMA RE) Place 1 application rectally daily as needed (constipation).  . traMADol (ULTRAM) 50 MG tablet Place 1 tablet (50 mg total) into feeding tube 2 (two) times daily. For pain  . VOLTAREN 1 % GEL Apply 2 mg topically 4 (four) times daily. For pain  . Water For Irrigation, Sterile (FREE WATER) SOLN Place 200 mLs into feeding tube every 6 (six) hours.   No facility-administered encounter medications on file as of 09/06/2017.     PHYSICAL EXAM:   BP 120 syst  P 88-100 R abnormal pattern  sats 93% rm air  Glucose 133  General:  Gravely ill,frail appearing, thin elderly female reclining in bed.  Head tilt to the L Cardiovascular: regular rate and rhythm Pulmonary: Cheyne stoke respirations, rhonchi throughout lung fields Abdomen: soft, nontender, + bowel sounds.  PEG tube in place GU: no  suprapubic tenderness Extremities: L hand edema and contractures of both hands Skin: clammy, exposed skin is intact Neurological: Alert, non-verbal.    Gonzella Lex, NP-C

## 2017-09-10 ENCOUNTER — Encounter: Payer: Self-pay | Admitting: Internal Medicine

## 2017-09-10 DIAGNOSIS — J690 Pneumonitis due to inhalation of food and vomit: Secondary | ICD-10-CM | POA: Insufficient documentation

## 2017-09-10 DIAGNOSIS — G9341 Metabolic encephalopathy: Secondary | ICD-10-CM | POA: Insufficient documentation

## 2017-09-10 DIAGNOSIS — R4702 Dysphasia: Secondary | ICD-10-CM | POA: Insufficient documentation

## 2017-09-10 DIAGNOSIS — J180 Bronchopneumonia, unspecified organism: Secondary | ICD-10-CM | POA: Insufficient documentation

## 2017-09-10 DIAGNOSIS — I10 Essential (primary) hypertension: Secondary | ICD-10-CM | POA: Insufficient documentation

## 2017-09-11 ENCOUNTER — Encounter: Payer: Self-pay | Admitting: Internal Medicine

## 2017-09-11 NOTE — Assessment & Plan Note (Signed)
Status post PEG tube placement; tolerating feedings okay; nothing by mouth

## 2017-09-11 NOTE — Assessment & Plan Note (Signed)
Controlled; continue Norvasc 5 mg and metoprolol 6.25 mg twice daily

## 2017-09-11 NOTE — Assessment & Plan Note (Signed)
Residual right-sided weakness with dysphasia and cannot speak but can communicate with gestures in a board; all chronic and stable; continue supportive care: Continue baclofen 20 mg 3 times daily for contractures

## 2017-09-13 NOTE — Progress Notes (Signed)
This encounter was created in error - please disregard.

## 2017-09-15 NOTE — Progress Notes (Signed)
This encounter was created in error - please disregard.

## 2017-09-16 ENCOUNTER — Encounter: Payer: Self-pay | Admitting: Internal Medicine

## 2017-10-02 DEATH — deceased

## 2018-10-26 IMAGING — CT CT HEAD W/O CM
4 series · 16 of 47 positions shown, 18 images · non-contrast
Comparison: Brain MRI 03/22/2017

CLINICAL DATA: Altered mental status

EXAM:
CT HEAD WITHOUT CONTRAST
TECHNIQUE: Contiguous axial images were obtained from the base of the skull
through the vertex without intravenous contrast.

[Series 3: head without · axial · non-contrast · 0.43mm/px · z∈[-120,-0]mm · 7 of 32 slices shown, 9 images]
[im 4/32  brain]
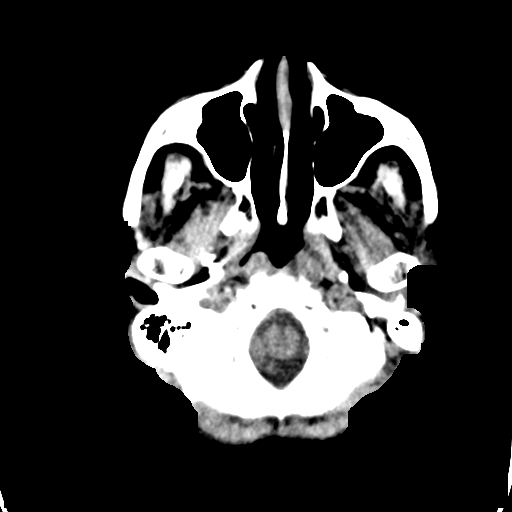
[im 4/32  bone]
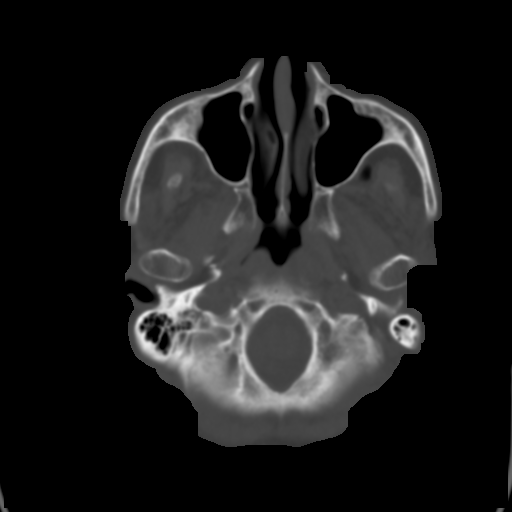
[im 8/32  brain]
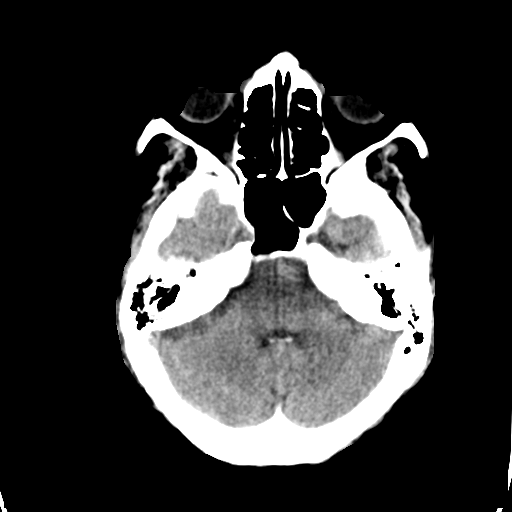
[im 12/32  brain]
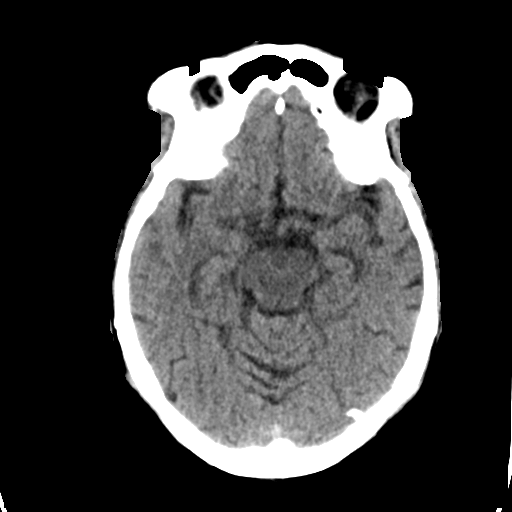
[im 16/32  brain]
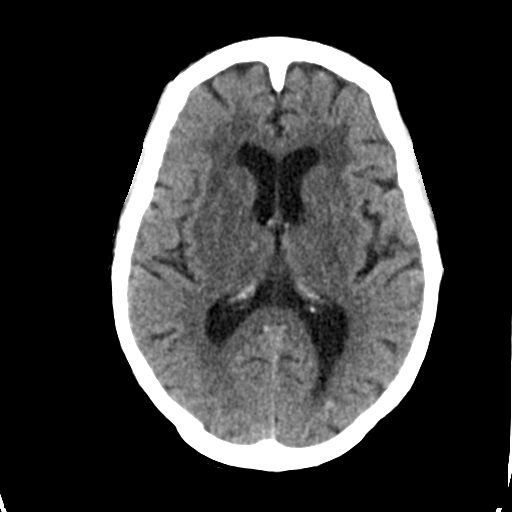
[im 20/32  brain]
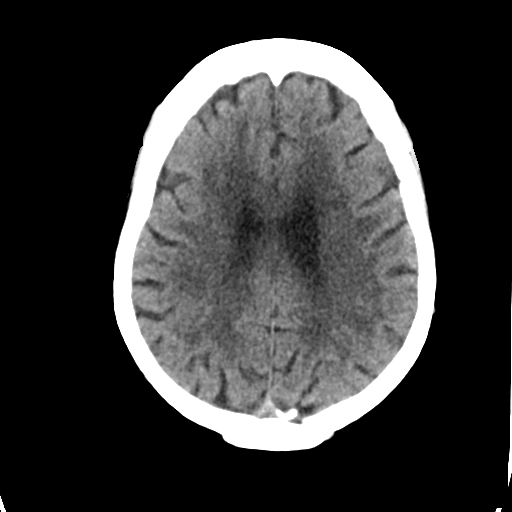
[im 20/32  bone]
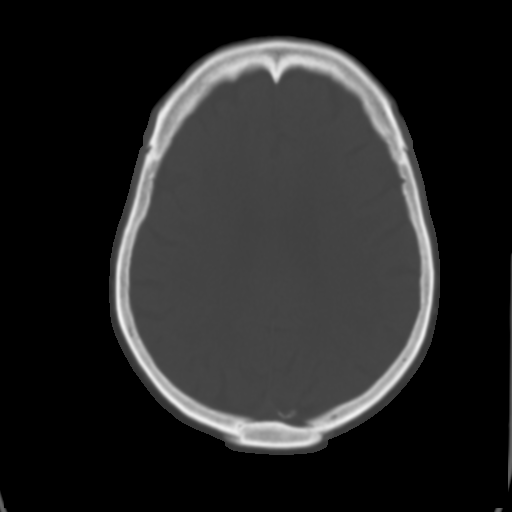
[im 24/32  brain]
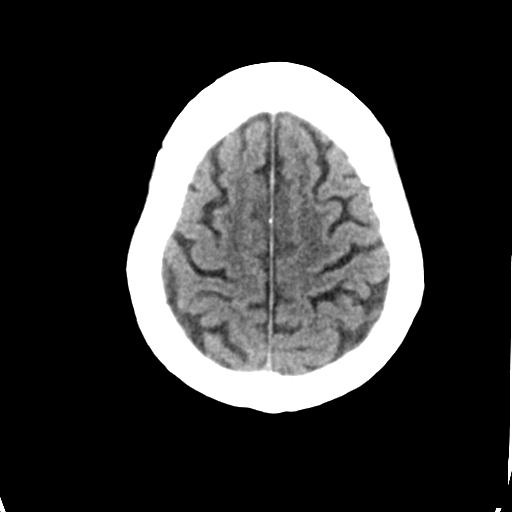
[im 28/32  brain]
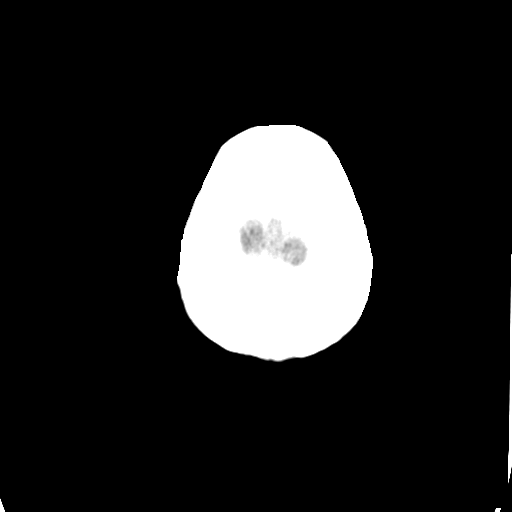

[Series 4: head bone · axial · 0.43mm/px · z∈[-121,-89]mm · 3 of 80 slices shown]
[im 8/80  bone]
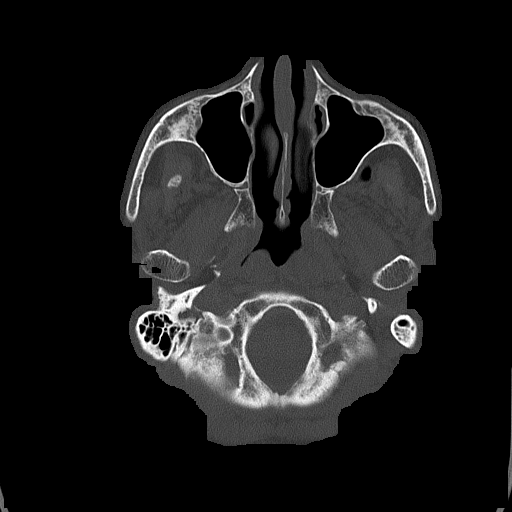
[im 16/80  bone]
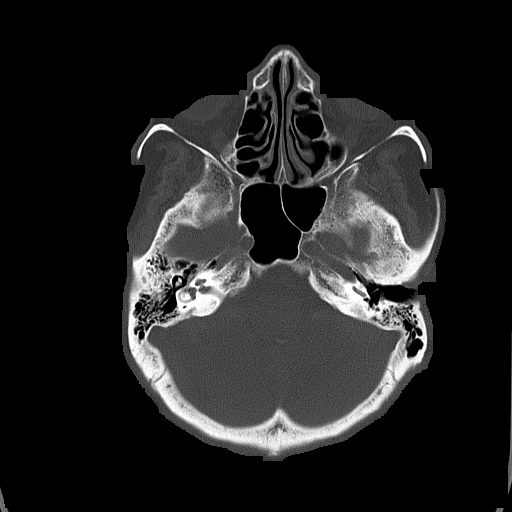
[im 24/80  bone]
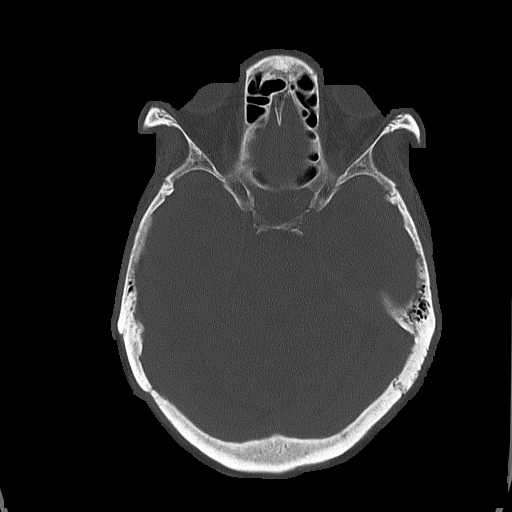

[Series 5: head without cor · coronal · non-contrast · 0.31mm/px · 3 of 67 slices shown]
[im 23/67  brain]
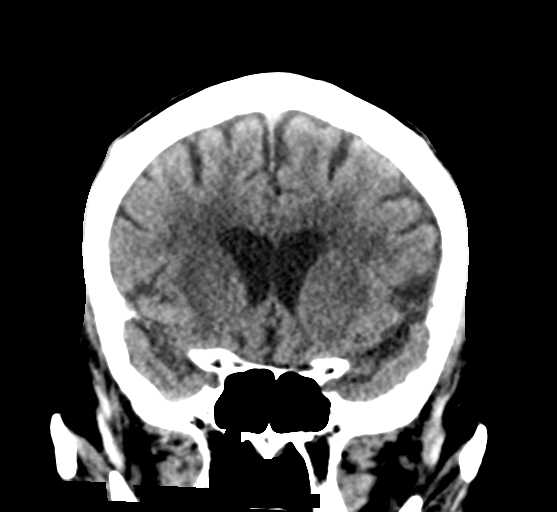
[im 30/67  brain]
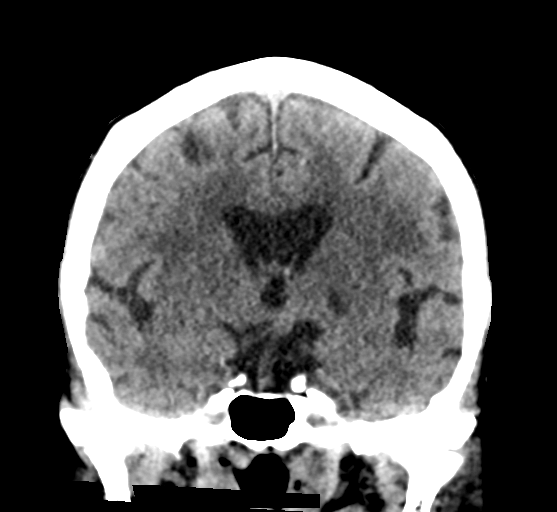
[im 37/67  brain]
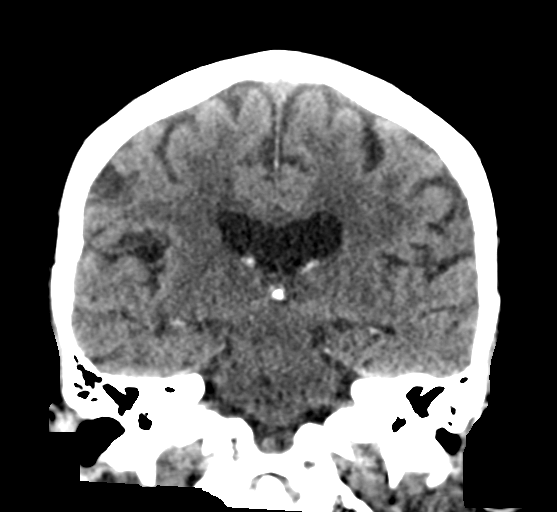

[Series 6: head without sag · sagittal · non-contrast · 0.31mm/px · 3 of 52 slices shown]
[im 18/52  brain]
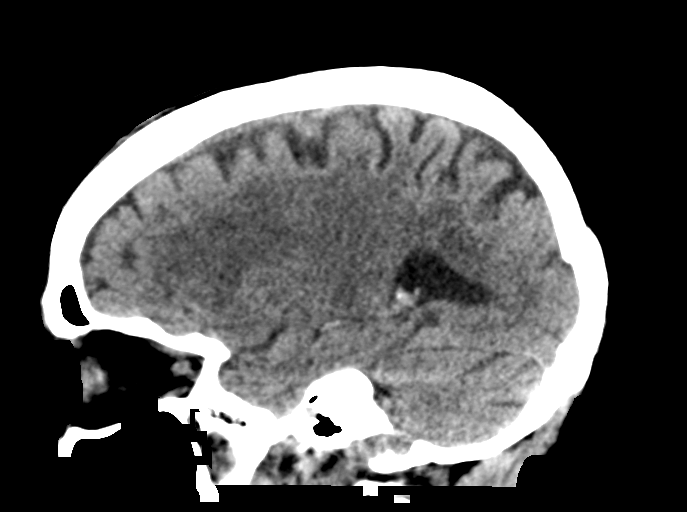
[im 26/52  brain]
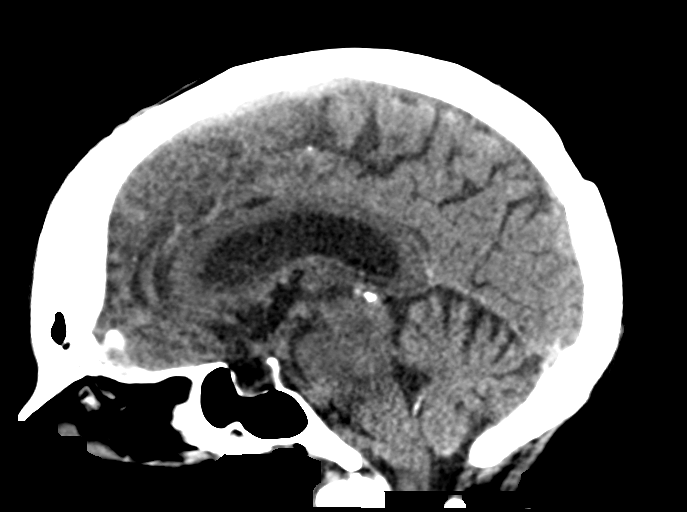
[im 35/52  brain]
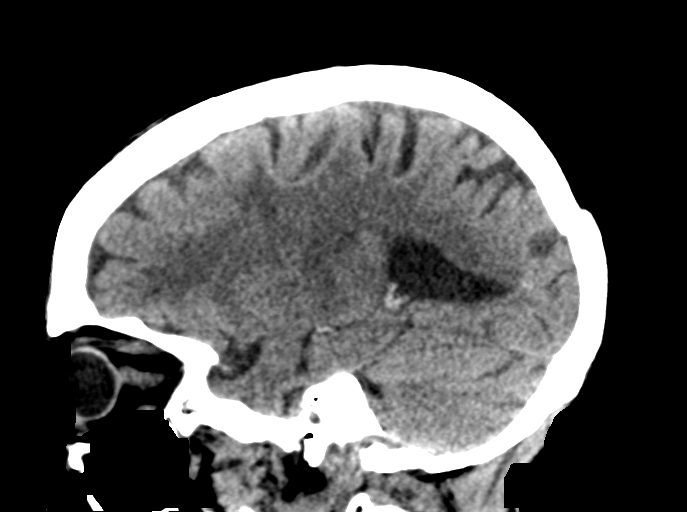

[16 of 47 positions shown; findings below may reference images not displayed]

FINDINGS: Brain: No mass lesion, intraparenchymal hemorrhage or extra-axial
collection. No evidence of acute cortical infarct. There is
periventricular hypoattenuation compatible with chronic
microvascular disease. Old left basal ganglia lacunar infarct.

Vascular: No hyperdense vessel or unexpected vascular calcification.

Skull: Normal visualized skull base, calvarium and extracranial soft
tissues.

Sinuses/Orbits: No sinus fluid levels or advanced mucosal
thickening. No mastoid effusion. Normal orbits.
IMPRESSION: Chronic ischemic microangiopathy without acute intracranial
abnormality. Old left gangliocapsular lacunar infarct.

## 2018-10-26 IMAGING — DX DG CHEST 1V PORT
1 series · 1 of 1 positions shown · non-contrast
Comparison: Chest x-ray 03/21/2017.

CLINICAL DATA: Loss of consciousness.  Right-sided weakness.

EXAM:
PORTABLE CHEST 1 VIEW

[chest ap]
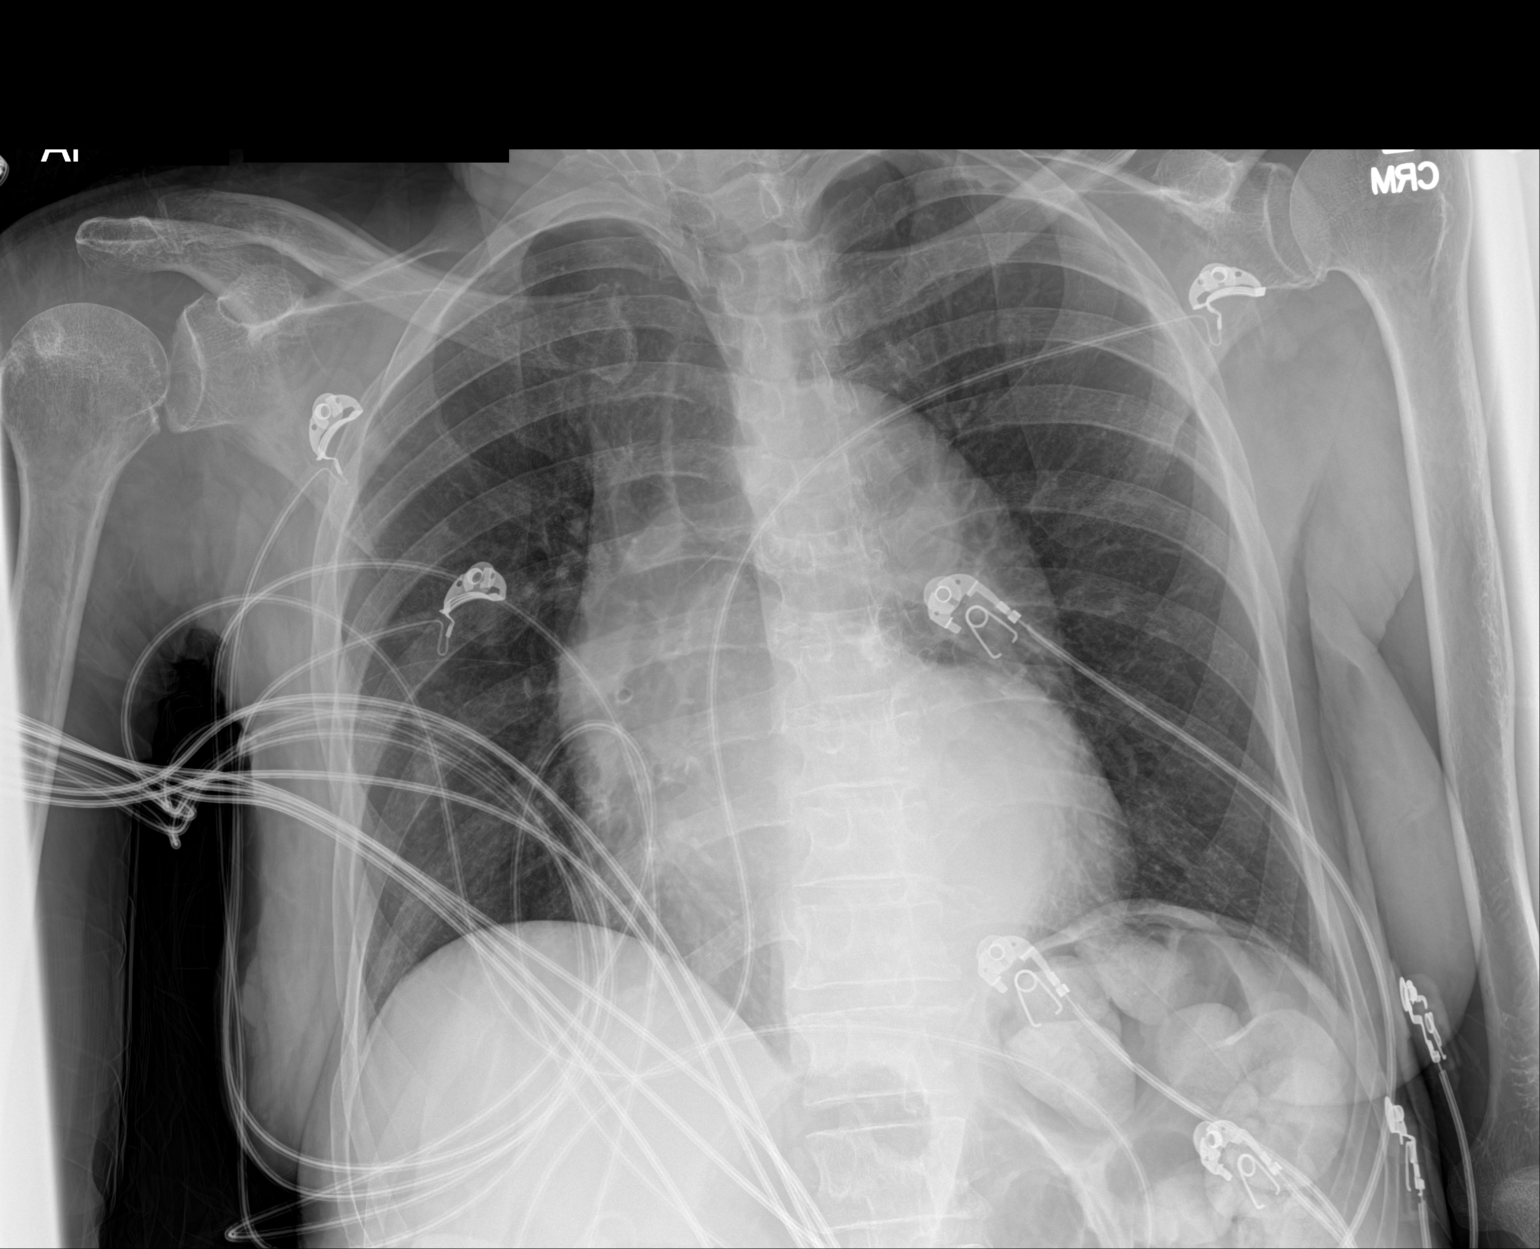

[1 of 1 positions shown; findings below may reference images not displayed]

FINDINGS: Cardiomegaly with normal pulmonary vascularity. No focal infiltrate.
No pleural effusion or pneumothorax. Degenerative change thoracic
spine.
IMPRESSION: 1.  Cardiomegaly with normal pulmonary vascularity.

2.  No acute pulmonary disease.
